# Patient Record
Sex: Male | Born: 1940 | Race: Black or African American | Hispanic: No | Marital: Married | Smoking: Never smoker
Health system: Southern US, Community
[De-identification: ages and names within clinical notes are randomized; demographics above are authoritative.]

## PROBLEM LIST (undated history)

## (undated) DIAGNOSIS — F0150 Vascular dementia without behavioral disturbance: Secondary | ICD-10-CM

## (undated) DIAGNOSIS — J45909 Unspecified asthma, uncomplicated: Secondary | ICD-10-CM

## (undated) DIAGNOSIS — R55 Syncope and collapse: Secondary | ICD-10-CM

## (undated) DIAGNOSIS — I10 Essential (primary) hypertension: Secondary | ICD-10-CM

## (undated) DIAGNOSIS — J4520 Mild intermittent asthma, uncomplicated: Secondary | ICD-10-CM

## (undated) DIAGNOSIS — N200 Calculus of kidney: Secondary | ICD-10-CM

## (undated) DIAGNOSIS — M1990 Unspecified osteoarthritis, unspecified site: Secondary | ICD-10-CM

## (undated) HISTORY — DX: Syncope and collapse: R55

## (undated) HISTORY — DX: Mild intermittent asthma, uncomplicated: J45.20

## (undated) HISTORY — PX: BACK SURGERY: SHX140

## (undated) HISTORY — PX: CHOLECYSTECTOMY: SHX55

## (undated) HISTORY — PX: KNEE SURGERY: SHX244

## (undated) HISTORY — PX: SHOULDER SURGERY: SHX246

---

## 2015-04-17 ENCOUNTER — Encounter (HOSPITAL_COMMUNITY): Payer: Self-pay | Admitting: Emergency Medicine

## 2015-04-17 ENCOUNTER — Observation Stay (HOSPITAL_COMMUNITY)
Admission: EM | Admit: 2015-04-17 | Discharge: 2015-04-18 | Disposition: A | Discharge status: Home / Self Care | Payer: No Typology Code available for payment source | Attending: Internal Medicine | Admitting: Internal Medicine

## 2015-04-17 ENCOUNTER — Emergency Department (HOSPITAL_COMMUNITY): Payer: No Typology Code available for payment source

## 2015-04-17 DIAGNOSIS — D649 Anemia, unspecified: Secondary | ICD-10-CM | POA: Diagnosis not present

## 2015-04-17 DIAGNOSIS — I10 Essential (primary) hypertension: Secondary | ICD-10-CM | POA: Diagnosis not present

## 2015-04-17 DIAGNOSIS — Z7982 Long term (current) use of aspirin: Secondary | ICD-10-CM | POA: Diagnosis not present

## 2015-04-17 DIAGNOSIS — S61211A Laceration without foreign body of left index finger without damage to nail, initial encounter: Secondary | ICD-10-CM | POA: Diagnosis not present

## 2015-04-17 DIAGNOSIS — R911 Solitary pulmonary nodule: Secondary | ICD-10-CM | POA: Insufficient documentation

## 2015-04-17 DIAGNOSIS — J45909 Unspecified asthma, uncomplicated: Secondary | ICD-10-CM | POA: Insufficient documentation

## 2015-04-17 DIAGNOSIS — R739 Hyperglycemia, unspecified: Secondary | ICD-10-CM

## 2015-04-17 DIAGNOSIS — S56129A Laceration of flexor muscle, fascia and tendon of unspecified finger at forearm level, initial encounter: Secondary | ICD-10-CM

## 2015-04-17 DIAGNOSIS — D539 Nutritional anemia, unspecified: Secondary | ICD-10-CM

## 2015-04-17 DIAGNOSIS — Z833 Family history of diabetes mellitus: Secondary | ICD-10-CM | POA: Diagnosis not present

## 2015-04-17 DIAGNOSIS — R55 Syncope and collapse: Principal | ICD-10-CM

## 2015-04-17 DIAGNOSIS — N289 Disorder of kidney and ureter, unspecified: Secondary | ICD-10-CM | POA: Diagnosis not present

## 2015-04-17 DIAGNOSIS — S61209A Unspecified open wound of unspecified finger without damage to nail, initial encounter: Secondary | ICD-10-CM

## 2015-04-17 DIAGNOSIS — Z87442 Personal history of urinary calculi: Secondary | ICD-10-CM | POA: Diagnosis not present

## 2015-04-17 HISTORY — DX: Calculus of kidney: N20.0

## 2015-04-17 HISTORY — DX: Essential (primary) hypertension: I10

## 2015-04-17 HISTORY — DX: Unspecified osteoarthritis, unspecified site: M19.90

## 2015-04-17 HISTORY — DX: Syncope and collapse: R55

## 2015-04-17 HISTORY — DX: Unspecified asthma, uncomplicated: J45.909

## 2015-04-17 HISTORY — DX: Disorder of kidney and ureter, unspecified: N28.9

## 2015-04-17 HISTORY — DX: Hyperglycemia, unspecified: R73.9

## 2015-04-17 HISTORY — DX: Anemia, unspecified: D64.9

## 2015-04-17 LAB — CBC WITH DIFFERENTIAL/PLATELET
Basophils Absolute: 0 10*3/uL (ref 0.0–0.1)
Basophils Relative: 0 % (ref 0–1)
EOS ABS: 0.3 10*3/uL (ref 0.0–0.7)
EOS PCT: 4 % (ref 0–5)
HCT: 31.5 % — ABNORMAL LOW (ref 39.0–52.0)
Hemoglobin: 9.8 g/dL — ABNORMAL LOW (ref 13.0–17.0)
LYMPHS ABS: 2.3 10*3/uL (ref 0.7–4.0)
LYMPHS PCT: 37 % (ref 12–46)
MCH: 19.6 pg — ABNORMAL LOW (ref 26.0–34.0)
MCHC: 31.1 g/dL (ref 30.0–36.0)
MCV: 62.9 fL — ABNORMAL LOW (ref 78.0–100.0)
MONO ABS: 0.5 10*3/uL (ref 0.1–1.0)
Monocytes Relative: 8 % (ref 3–12)
NEUTROS PCT: 50 % (ref 43–77)
Neutro Abs: 3.1 10*3/uL (ref 1.7–7.7)
PLATELETS: 321 10*3/uL (ref 150–400)
RBC: 5.01 MIL/uL (ref 4.22–5.81)
RDW: 19.2 % — ABNORMAL HIGH (ref 11.5–15.5)
WBC: 6.1 10*3/uL (ref 4.0–10.5)

## 2015-04-17 LAB — URINALYSIS, ROUTINE W REFLEX MICROSCOPIC
Bilirubin Urine: NEGATIVE
GLUCOSE, UA: NEGATIVE mg/dL
Ketones, ur: NEGATIVE mg/dL
Nitrite: NEGATIVE
PROTEIN: 100 mg/dL — AB
Specific Gravity, Urine: 1.017 (ref 1.005–1.030)
Urobilinogen, UA: 0.2 mg/dL (ref 0.0–1.0)
pH: 7 (ref 5.0–8.0)

## 2015-04-17 LAB — I-STAT CHEM 8, ED
BUN: 18 mg/dL (ref 6–20)
Calcium, Ion: 1.18 mmol/L (ref 1.13–1.30)
Chloride: 103 mmol/L (ref 101–111)
Creatinine, Ser: 1.3 mg/dL — ABNORMAL HIGH (ref 0.61–1.24)
GLUCOSE: 157 mg/dL — AB (ref 65–99)
HEMATOCRIT: 36 % — AB (ref 39.0–52.0)
Hemoglobin: 12.2 g/dL — ABNORMAL LOW (ref 13.0–17.0)
POTASSIUM: 3.7 mmol/L (ref 3.5–5.1)
Sodium: 141 mmol/L (ref 135–145)
TCO2: 23 mmol/L (ref 0–100)

## 2015-04-17 LAB — BASIC METABOLIC PANEL
Anion gap: 11 (ref 5–15)
BUN: 14 mg/dL (ref 6–20)
CHLORIDE: 104 mmol/L (ref 101–111)
CO2: 25 mmol/L (ref 22–32)
Calcium: 9.3 mg/dL (ref 8.9–10.3)
Creatinine, Ser: 1.34 mg/dL — ABNORMAL HIGH (ref 0.61–1.24)
GFR calc non Af Amer: 52 mL/min — ABNORMAL LOW (ref >60)
GFR, EST AFRICAN AMERICAN: 60 mL/min — AB (ref >60)
Glucose, Bld: 157 mg/dL — ABNORMAL HIGH (ref 65–99)
Potassium: 3.6 mmol/L (ref 3.5–5.1)
SODIUM: 140 mmol/L (ref 135–145)

## 2015-04-17 LAB — RAPID URINE DRUG SCREEN, HOSP PERFORMED
Amphetamines: NOT DETECTED
BENZODIAZEPINES: NOT DETECTED
Barbiturates: NOT DETECTED
Cocaine: NOT DETECTED
Opiates: NOT DETECTED
Tetrahydrocannabinol: POSITIVE — AB

## 2015-04-17 LAB — TROPONIN I
Troponin I: <0.03 ng/mL (ref <0.031)
Troponin I: <0.03 ng/mL (ref <0.031)

## 2015-04-17 LAB — URINE MICROSCOPIC-ADD ON

## 2015-04-17 LAB — ETHANOL

## 2015-04-17 LAB — D-DIMER, QUANTITATIVE (NOT AT ARMC): D-Dimer, Quant: 1.23 ug/mL-FEU — ABNORMAL HIGH (ref 0.00–0.48)

## 2015-04-17 LAB — POC OCCULT BLOOD, ED: FECAL OCCULT BLD: NEGATIVE

## 2015-04-17 MED ORDER — LORAZEPAM 2 MG/ML IJ SOLN
1.0000 mg | Freq: Once | INTRAMUSCULAR | Status: AC
  Administered 2015-04-17: 1 mg via INTRAVENOUS

## 2015-04-17 MED ORDER — SODIUM CHLORIDE 0.9 % IJ SOLN
3.0000 mL | Freq: Two times a day (BID) | INTRAMUSCULAR | Status: DC
  Administered 2015-04-17: 3 mL via INTRAVENOUS

## 2015-04-17 MED ORDER — ACETAMINOPHEN 325 MG PO TABS
650.0000 mg | ORAL_TABLET | Freq: Four times a day (QID) | ORAL | Status: DC | PRN
  Administered 2015-04-17 – 2015-04-18 (×2): 650 mg via ORAL
  Filled 2015-04-17 (×2): qty 2

## 2015-04-17 MED ORDER — SODIUM CHLORIDE 0.9 % IV SOLN
INTRAVENOUS | Status: AC
  Administered 2015-04-18: via INTRAVENOUS

## 2015-04-17 MED ORDER — IOHEXOL 300 MG/ML  SOLN
100.0000 mL | Freq: Once | INTRAMUSCULAR | Status: AC | PRN
  Administered 2015-04-17: 100 mL via INTRAVENOUS

## 2015-04-17 MED ORDER — LIDOCAINE HCL (PF) 1 % IJ SOLN
30.0000 mL | Freq: Once | INTRAMUSCULAR | Status: AC
  Administered 2015-04-17: 30 mL
  Filled 2015-04-17: qty 30

## 2015-04-17 MED ORDER — ENOXAPARIN SODIUM 30 MG/0.3ML ~~LOC~~ SOLN
30.0000 mg | Freq: Every day | SUBCUTANEOUS | Status: DC
  Administered 2015-04-17: 30 mg via SUBCUTANEOUS
  Filled 2015-04-17 (×2): qty 0.3

## 2015-04-17 MED ORDER — ACETAMINOPHEN 650 MG RE SUPP: 650.0000 mg | Freq: Four times a day (QID) | RECTAL | Status: DC | PRN

## 2015-04-17 MED ORDER — LORAZEPAM 2 MG/ML IJ SOLN
1.0000 mg | Freq: Once | INTRAMUSCULAR | Status: AC
  Administered 2015-04-17: 1 mg via INTRAVENOUS
  Filled 2015-04-17: qty 1

## 2015-04-17 NOTE — ED Notes (Signed)
Patient transported to radiology
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
## 2015-04-17 NOTE — Progress Notes (Signed)
Orthopedic Tech Progress Note Patient Details:  Jerry [name] Jul 07, 1941 [id number] Applied Velcro thumb spica splint to LUE.  Pulses, motion, sensation intact before and after application.  Capillary refill less than 2 seconds before and after application. Ortho Devices Type of Ortho Device: Thumb velcro splint Splint Material: Other (comment) Ortho Device/Splint Location: LUE Ortho Device/Splint Interventions: Application   Darrol Poke 04/17/2015, 9:20 PM
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
## 2015-04-17 NOTE — ED Provider Notes (Signed)
LACERATION REPAIR Performed by: Verl Dicker Authorized by: Verl Dicker Consent: Verbal consent obtained. Risks and benefits: risks, benefits and alternatives were discussed Consent given by: patient Patient identity confirmed: provided demographic data Prepped and Draped in normal sterile fashion Wound explored  Laceration Location: Left index finger  Laceration Length: 2 cm  No Foreign Bodies seen or palpated  Anesthesia: local infiltration  Local anesthetic: lidocaine 1 % w/o epinephrine  Anesthetic total: 5 ml  Irrigation method: syringe Amount of cleaning: standard  Skin closure: 4-0 Prolene  Number of sutures: 4  Technique: Simple interuupted  Patient tolerance: Patient tolerated the procedure well with no immediate complications.   Comer Locket, PA-C 04/17/15 2026  Ezequiel Essex, MD 04/18/15 [phone]

## 2015-04-18 ENCOUNTER — Observation Stay (HOSPITAL_COMMUNITY): Payer: No Typology Code available for payment source

## 2015-04-18 DIAGNOSIS — N289 Disorder of kidney and ureter, unspecified: Secondary | ICD-10-CM | POA: Diagnosis not present

## 2015-04-18 DIAGNOSIS — R55 Syncope and collapse: Secondary | ICD-10-CM

## 2015-04-18 LAB — COMPREHENSIVE METABOLIC PANEL
ALT: 19 U/L (ref 17–63)
AST: 24 U/L (ref 15–41)
Albumin: 3.4 g/dL — ABNORMAL LOW (ref 3.5–5.0)
Alkaline Phosphatase: 69 U/L (ref 38–126)
Anion gap: 6 (ref 5–15)
BUN: 15 mg/dL (ref 6–20)
CHLORIDE: 108 mmol/L (ref 101–111)
CO2: 26 mmol/L (ref 22–32)
Calcium: 8.9 mg/dL (ref 8.9–10.3)
Creatinine, Ser: 1.14 mg/dL (ref 0.61–1.24)
GFR calc Af Amer: >60 mL/min (ref >60)
Glucose, Bld: 103 mg/dL — ABNORMAL HIGH (ref 65–99)
Potassium: 4 mmol/L (ref 3.5–5.1)
SODIUM: 140 mmol/L (ref 135–145)
Total Bilirubin: 0.4 mg/dL (ref 0.3–1.2)
Total Protein: 6.1 g/dL — ABNORMAL LOW (ref 6.5–8.1)

## 2015-04-18 LAB — FERRITIN: Ferritin: 5 ng/mL — ABNORMAL LOW (ref 24–336)

## 2015-04-18 LAB — CBC
HCT: 30.9 % — ABNORMAL LOW (ref 39.0–52.0)
HEMOGLOBIN: 9.6 g/dL — AB (ref 13.0–17.0)
MCH: 19.5 pg — ABNORMAL LOW (ref 26.0–34.0)
MCHC: 31.1 g/dL (ref 30.0–36.0)
MCV: 62.7 fL — AB (ref 78.0–100.0)
Platelets: 272 10*3/uL (ref 150–400)
RBC: 4.93 MIL/uL (ref 4.22–5.81)
RDW: 19.3 % — AB (ref 11.5–15.5)
WBC: 7.8 10*3/uL (ref 4.0–10.5)

## 2015-04-18 LAB — IRON AND TIBC
Iron: 44 ug/dL — ABNORMAL LOW (ref 45–182)
Saturation Ratios: 11 % — ABNORMAL LOW (ref 17.9–39.5)
TIBC: 403 ug/dL (ref 250–450)
UIBC: 359 ug/dL

## 2015-04-18 LAB — TROPONIN I
Troponin I: <0.03 ng/mL (ref <0.031)
Troponin I: <0.03 ng/mL (ref <0.031)

## 2015-04-18 LAB — VITAMIN B12: Vitamin B-12: 422 pg/mL (ref 180–914)

## 2015-04-18 LAB — TSH: TSH: 1.584 u[IU]/mL (ref 0.350–4.500)

## 2015-04-18 LAB — SEDIMENTATION RATE: SED RATE: 1 mm/h (ref 0–16)

## 2015-04-18 MED ORDER — OXYCODONE HCL 5 MG PO TABS: 5.0000 mg | ORAL_TABLET | Freq: Four times a day (QID) | ORAL | Status: DC | PRN

## 2015-04-18 MED ORDER — ENOXAPARIN SODIUM 40 MG/0.4ML ~~LOC~~ SOLN
40.0000 mg | SUBCUTANEOUS | Status: DC
  Filled 2015-04-18: qty 0.4

## 2015-04-18 MED ORDER — OXYCODONE HCL 5 MG PO TABS
5.0000 mg | ORAL_TABLET | Freq: Four times a day (QID) | ORAL | Status: DC | PRN
  Administered 2015-04-18 (×3): 5 mg via ORAL
  Filled 2015-04-18 (×3): qty 1

## 2015-04-18 NOTE — Progress Notes (Signed)
  Echocardiogram 2D Echocardiogram has been performed.  Jerry [name] 04/18/2015, 3:31 PM
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
## 2015-04-18 NOTE — ED Notes (Signed)
1mg  of ativan given IV per providers order; pt continued to display nervousness and anxiety d/t upcoming bedside procedure involving needles; Cartner, B at bedside verbalized order to give other dose of 1mg  ativan; second dose of ativan given from 1st vial- therefore no ativan to waste in pyxis

## 2015-04-19 LAB — PROTEIN ELECTROPHORESIS, SERUM
A/G Ratio: 1.1 (ref 0.7–2.0)
ALBUMIN ELP: 3.3 g/dL (ref 3.2–5.6)
ALPHA-2-GLOBULIN: 0.6 g/dL (ref 0.4–1.2)
Alpha-1-Globulin: 0.2 g/dL (ref 0.1–0.4)
BETA GLOBULIN: 1 g/dL (ref 0.6–1.3)
GAMMA GLOBULIN: 1.1 g/dL (ref 0.5–1.6)
Globulin, Total: 2.9 g/dL (ref 2.0–4.5)
Total Protein ELP: 6.2 g/dL (ref 6.0–8.5)

## 2015-04-19 LAB — HEMOGLOBIN A1C
Hgb A1c MFr Bld: 6 % — ABNORMAL HIGH (ref 4.8–5.6)
MEAN PLASMA GLUCOSE: 126 mg/dL

## 2015-04-21 LAB — FOLATE RBC
Folate, Hemolysate: 425.6 ng/mL
Folate, RBC: 1219 ng/mL (ref >498)
HEMATOCRIT: 34.9 % — AB (ref 37.5–51.0)
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
## 2017-11-13 DIAGNOSIS — C679 Malignant neoplasm of bladder, unspecified: Secondary | ICD-10-CM

## 2017-11-13 HISTORY — DX: Malignant neoplasm of bladder, unspecified: C67.9

## 2020-04-15 DIAGNOSIS — E291 Testicular hypofunction: Secondary | ICD-10-CM | POA: Insufficient documentation

## 2020-04-15 HISTORY — DX: Testicular hypofunction: E29.1

## 2020-07-20 ENCOUNTER — Ambulatory Visit: Payer: Medicare Other | Attending: Internal Medicine

## 2020-07-20 DIAGNOSIS — Z23 Encounter for immunization: Secondary | ICD-10-CM

## 2020-07-20 NOTE — Progress Notes (Signed)
   Covid-19 Vaccination [facility]  Name:  Jerry [name]    MRN: [id number] DOB: Sep 12, 1941  07/20/2020  Jerry [name] was observed post Covid-19 immunization for 15 minutes without incident. He was provided with Vaccine Information Sheet and instruction to access the V-Safe system.   Jerry [name] was instructed to call 911 with any severe reactions post vaccine: [name] Difficulty breathing  . Swelling of face and throat  . A fast heartbeat  . A bad rash all over body  . Dizziness and weakness

## 2021-02-18 ENCOUNTER — Encounter (HOSPITAL_BASED_OUTPATIENT_CLINIC_OR_DEPARTMENT_OTHER): Payer: Self-pay

## 2021-02-18 DIAGNOSIS — G4700 Insomnia, unspecified: Secondary | ICD-10-CM

## 2021-03-16 ENCOUNTER — Ambulatory Visit (HOSPITAL_BASED_OUTPATIENT_CLINIC_OR_DEPARTMENT_OTHER): Payer: Medicare Other | Attending: Internal Medicine | Admitting: Internal Medicine

## 2021-03-16 ENCOUNTER — Encounter (HOSPITAL_BASED_OUTPATIENT_CLINIC_OR_DEPARTMENT_OTHER): Payer: Self-pay | Admitting: Internal Medicine

## 2021-03-16 ENCOUNTER — Other Ambulatory Visit: Payer: Self-pay

## 2021-03-16 DIAGNOSIS — G4733 Obstructive sleep apnea (adult) (pediatric): Secondary | ICD-10-CM | POA: Diagnosis not present

## 2021-03-16 DIAGNOSIS — G4730 Sleep apnea, unspecified: Secondary | ICD-10-CM | POA: Diagnosis present

## 2021-03-16 DIAGNOSIS — G4700 Insomnia, unspecified: Secondary | ICD-10-CM

## 2021-03-16 HISTORY — DX: Sleep apnea, unspecified: G47.00

## 2021-03-16 HISTORY — DX: Sleep apnea, unspecified: G47.30

## 2021-03-20 DIAGNOSIS — G4730 Sleep apnea, unspecified: Secondary | ICD-10-CM

## 2021-03-20 DIAGNOSIS — G4700 Insomnia, unspecified: Secondary | ICD-10-CM | POA: Diagnosis not present

## 2021-03-20 NOTE — Procedures (Signed)
     Patient Name: Jerry [name], Jerry [name] Date: 03/16/2021 Gender: Male D.O.B: [birth year] Age (years): 79 Referring Provider: Willey Blade Height (inches): 71 Interpreting Physician: Baird Lyons MD, ABSM Weight (lbs): 190 RPSGT: Carolin Coy BMI: 26 MRN: [id number] Neck Size: 13.00  CLINICAL INFORMATION Sleep Study Type: NPSG Indication for sleep study: Hypertension Epworth Sleepiness Score: 4  SLEEP STUDY TECHNIQUE As per the AASM Manual for the Scoring of Sleep and Associated Events v2.3 (April 2016) with a hypopnea requiring 4% desaturations.  The channels recorded and monitored were frontal, central and occipital EEG, electrooculogram (EOG), submentalis EMG (chin), nasal and oral airflow, thoracic and abdominal wall motion, anterior tibialis EMG, snore microphone, electrocardiogram, and pulse oximetry.  MEDICATIONS Medications self-administered by patient taken the night of the study : none reported  SLEEP ARCHITECTURE The study was initiated at 10:27:13 PM and ended at 4:50:54 AM.  Sleep onset time was 12.9 minutes and the sleep efficiency was 84.8%%. The total sleep time was 325.5 minutes.  Stage REM latency was 54.0 minutes.  The patient spent 18.0%% of the night in stage N1 sleep, 58.5%% in stage N2 sleep, 1.5%% in stage N3 and 22% in REM.  Alpha intrusion was absent.  Supine sleep was 9.29%.  RESPIRATORY PARAMETERS The overall apnea/hypopnea index (AHI) was 10.3 per hour. There were 14 total apneas, including 12 obstructive, 0 central and 2 mixed apneas. There were 42 hypopneas and 64 RERAs.  The AHI during Stage REM sleep was 11.7 per hour.  AHI while supine was 45.6 per hour.  The mean oxygen saturation was 92.6%. The minimum SpO2 during sleep was 87.0%.  moderate snoring was noted during this study.  CARDIAC DATA The 2 lead EKG demonstrated sinus rhythm. The mean heart rate was 60.6 beats per minute. Other EKG findings include: PVCs.  LEG  MOVEMENT DATA The total PLMS were 0 with a resulting PLMS index of 0.0. Associated arousal with leg movement index was 0.0 .  IMPRESSIONS - Mild obstructive sleep apnea occurred during this study (AHI = 10.3/h). - Mild oxygen desaturation was noted during this study (Min O2 = 87.0%). Mean O2 saturation 92.6%. - The patient snored with moderate snoring volume. - EKG findings include PVCs.and PACs. - Clinically significant periodic limb movements did not occur during sleep. No significant associated arousals.  DIAGNOSIS - Obstructive Sleep Apnea (G47.33)  RECOMMENDATIONS - Suggest CPAP titration sleep study. Other options, including autopap, management consultation, a fitted oral appliance or ENT evaluation, would be based on clinical judgment. - Be careful with alcohol, sedatives and other CNS depressants that may worsen sleep apnea and disrupt normal sleep architecture. - Sleep hygiene should be reviewed to assess factors that may improve sleep quality. - Weight management and regular exercise should be initiated or continued if appropriate.  [Electronically signed] 03/20/2021 11:51 AM  Baird Lyons MD, ABSM Diplomate, American Board of Sleep Medicine   NPI: [id number]                        [place] [place] [facility]  ELECTRONICALLY SIGNED ON:  03/20/2021, 11:44 AM [place] PH: [phone]   FX: [phone] [place]

## 2021-07-08 ENCOUNTER — Ambulatory Visit (HOSPITAL_COMMUNITY)
Admission: RE | Admit: 2021-07-08 | Discharge: 2021-07-08 | Disposition: A | Discharge status: Home / Self Care | Payer: Medicare Other | Source: Ambulatory Visit | Attending: Urology | Admitting: Urology

## 2021-07-08 ENCOUNTER — Other Ambulatory Visit (HOSPITAL_COMMUNITY): Payer: Self-pay | Admitting: Urology

## 2021-07-08 ENCOUNTER — Other Ambulatory Visit: Payer: Self-pay

## 2021-07-08 DIAGNOSIS — C678 Malignant neoplasm of overlapping sites of bladder: Secondary | ICD-10-CM | POA: Insufficient documentation

## 2021-07-11 NOTE — Progress Notes (Signed)
07/11/21- 80yoM never smoker for sleep evaluation courtesy of Dr Willey Blade with concern of OSA Medical problem list includes HTN, Asthma,,Renal Insufficiency, hx Blader Cancer/ Bag, hx Prostate Cancer,  NPSG- 03/16/21- AHI 10.3/ hr, desaturation to 87%, body weight 190 lbs Epworth score-5 Body weight today-183 lbs -----Sleep Consult-wakes up a lot due urinary bag, snores Discussed sleep study and mild OSA. He complains of being sleepy and going to bed with short sleep latency by a bout 7;30-8:30 each night. Wakes after 3-4 hours with "busy brain', various stressors. Lies in bed until regains sleep after few hours, getting up 7:30-8:00 AM. Aware of snoring. Notes post nasal drip, somatic/ arthritic discomforts, bothersome pulling and dealing with urine drainage bag. No ENT surgery.Some asthma managed with Bevespi- no rescue inhaler. Some postnasal drip- no antihistamine.  Prior to Admission medications   Medication Sig Start Date End Date Taking? Authorizing Provider  albuterol (VENTOLIN HFA) 108 (90 Base) MCG/ACT inhaler Inhale 2 puffs into the lungs every 6 (six) hours as needed for wheezing or shortness of breath. 07/12/21  Yes Helina Hullum, Tarri Fuller D, MD  amLODipine (NORVASC) 10 MG tablet Take 10 mg by mouth at bedtime.   Yes [provider]  budesonide (PULMICORT) 0.5 MG/2ML nebulizer solution Take 0.5 mg by nebulization 2 (two) times daily as needed (FOR SHORTNESS OF BREATH).   Yes [provider]  Cholecalciferol 1000 UNITS tablet Take 2,000 Units by mouth daily.    Yes [provider]  Glycopyrrolate-Formoterol (BEVESPI AEROSPHERE) 9-4.8 MCG/ACT AERO Inhale 2 puffs into the lungs daily.   Yes [provider]  ipratropium (ATROVENT) 0.02 % nebulizer solution Take 0.5 mg by nebulization 4 (four) times daily.   Yes [provider]  loperamide (IMODIUM) 2 MG capsule Take 2 mg by mouth as needed for diarrhea or loose stools.   Yes [provider]   sodium chloride (OCEAN) 0.65 % SOLN nasal spray Place 1 spray into both nostrils as needed for congestion.   Yes [provider]  temazepam (RESTORIL) 15 MG capsule Take 1 capsule (15 mg total) by mouth at bedtime as needed for sleep. 07/12/21  Yes Baird Lyons D, MD  aspirin EC 81 MG tablet Take 81 mg by mouth daily. Patient not taking: Reported on 07/12/2021    [provider]  hydrochlorothiazide (MICROZIDE) 12.5 MG capsule Take 12.5 mg by mouth daily.    [provider]  losartan (COZAAR) 100 MG tablet Take 100 mg by mouth daily. Patient not taking: Reported on 07/12/2021    [provider]  Magnesium Oxide (MAG-OX PO) Take 1 tablet by mouth every other day. Patient not taking: Reported on 07/12/2021    [provider]  oxyCODONE (OXY IR/ROXICODONE) 5 MG immediate release tablet Take 1 tablet (5 mg total) by mouth every 6 (six) hours as needed for moderate pain or severe pain. Patient not taking: Reported on 07/12/2021 04/18/15   Caren Griffins, MD  ranitidine (ZANTAC) 300 MG tablet Take 300-600 mg by mouth at bedtime. Patient not taking: Reported on 07/12/2021    [provider]  tamsulosin (FLOMAX) 0.4 MG CAPS capsule Take 0.4 mg by mouth daily after supper.  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   Kidney stone    Past Surgical History:  Procedure Laterality Date  BACK SURGERY     CHOLECYSTECTOMY     KNEE SURGERY Left    SHOULDER SURGERY     for rotator cuff tear   Family History  Problem Relation Age of Onset   Diabetes Mother    Diabetes Father    Social History   Socioeconomic History   Marital status: Married    Spouse name: Not on file   Number of children: Not on file   Years of education: Not on file   Highest education level: Not on file  Occupational History    Not on file  Tobacco Use   Smoking status: Never   Smokeless tobacco: Never  Substance and Sexual Activity   Alcohol use: Yes    Alcohol/week: 3.0 standard drinks    Types: 3 Standard drinks or equivalent per week   Drug use: Yes    Types: Marijuana   Sexual activity: Not on file  Other Topics Concern   Not on file  Social History Narrative   Not on file   Social Determinants of Health   Financial Resource Strain: Not on file  Food Insecurity: Not on file  Transportation Needs: Not on file  Physical Activity: Not on file  Stress: Not on file  Social Connections: Not on file  Intimate Partner Violence: Not on file   ROS-see HPI   + = positive Constitutional:    weight loss, night sweats, fevers, chills, fatigue, lassitude. HEENT:    headaches, difficulty swallowing, tooth/dental problems, sore throat,       sneezing, itching, ear ache, +nasal congestion, post nasal drip, snoring CV:    chest pain, orthopnea, PND, swelling in lower extremities, anasarca,                        dizziness, palpitations Resp:   shortness of breath with exertion or at rest.                productive cough,   non-productive cough, coughing up of blood.              change in color of mucus.  wheezing.   Skin:    rash or lesions. GI:  No-   heartburn, indigestion, abdominal pain, nausea, vomiting, diarrhea,                 change in bowel habits, loss of appetite GU: dysuria, change in color of urine, no urgency or frequency.   flank pain. MS:   joint pain, stiffness, decreased range of motion, back pain. Neuro-    + peripheral neuropathy Psych:  change in mood or affect.  depression or anxiety.   memory loss.   OBJ- Physical Exam General- Alert, Oriented, Affect-appropriate, Distress- none acute, +not obese Skin- rash-none, lesions- none, excoriation- none Lymphadenopathy- none Head- atraumatic            Eyes- Gross vision intact, PERRLA, conjunctivae and secretions clear             Ears- Hearing, canals-normal            Nose- Clear, no-Septal dev, mucus, polyps, erosion, perforation             Throat- Mallampati IV, mucosa clear , drainage- none, tonsils- atrophic, + teeth Neck- flexible , trachea midline, no stridor , thyroid nl, carotid no bruit Chest - symmetrical excursion , unlabored           Heart/CV- RRR , no murmur , no  gallop  , no rub, nl s1 s2                           - JVD- none , edema- none, stasis changes- none, varices- none           Lung- clear to P&A, wheeze- none, cough- none , dullness-none, rub- none           Chest wall-  Abd-  Br/ Gen/ Rectal- Not done, not indicated Extrem- cyanosis- none, clubbing, none, atrophy- none, strength- nl Neuro- grossly intact to observation

## 2021-07-12 ENCOUNTER — Other Ambulatory Visit: Payer: Self-pay

## 2021-07-12 ENCOUNTER — Ambulatory Visit (INDEPENDENT_AMBULATORY_CARE_PROVIDER_SITE_OTHER): Payer: Medicare Other | Admitting: Internal Medicine

## 2021-07-12 ENCOUNTER — Encounter: Payer: Self-pay | Admitting: Internal Medicine

## 2021-07-12 VITALS — BP 148/72 | HR 59 | Temp 97.7°F | Ht 71.5 in | Wt 183.0 lb

## 2021-07-12 DIAGNOSIS — G4733 Obstructive sleep apnea (adult) (pediatric): Secondary | ICD-10-CM | POA: Diagnosis not present

## 2021-07-12 DIAGNOSIS — J4520 Mild intermittent asthma, uncomplicated: Secondary | ICD-10-CM | POA: Insufficient documentation

## 2021-07-12 DIAGNOSIS — G4700 Insomnia, unspecified: Secondary | ICD-10-CM | POA: Diagnosis not present

## 2021-07-12 HISTORY — DX: Obstructive sleep apnea (adult) (pediatric): G47.33

## 2021-07-12 MED ORDER — ALBUTEROL SULFATE HFA 108 (90 BASE) MCG/ACT IN AERS: 2.0000 | INHALATION_SPRAY | Freq: Four times a day (QID) | RESPIRATORY_TRACT | 6 refills | Status: AC | PRN

## 2021-07-12 MED ORDER — TEMAZEPAM 15 MG PO CAPS: 15.0000 mg | ORAL_CAPSULE | Freq: Every evening | ORAL | 0 refills | Status: DC | PRN

## 2021-07-12 NOTE — Assessment & Plan Note (Signed)
Mild OSA. Not clear how important this is, but needs some control if we are going to try to help him sleep more soundly. Plan- CPAP auto

## 2021-07-12 NOTE — Assessment & Plan Note (Signed)
Discussed sleep hygiene. Not sure he isn't going to bed early to escape from stress/ depression issues.  Plan- treat OSA. Add temazepam for trial while on CPAP as discussed Consider more treatment of somatic discomfort and postnasal drip.

## 2021-07-12 NOTE — Patient Instructions (Signed)
Scripts being sent to your [facility] on [address] for temazepam for sleep and for an albuterol rescue inhaler  Order- new DME, new CPAP auto 5-15, mask of choice, humidifier, supplies, AirView/ card Try wearing this every night when you sleep to control the sleep apnea and help your breathing while you sleep.  Please call if we can help

## 2021-07-12 NOTE — Assessment & Plan Note (Signed)
Discussed use of Bevespi as maintenance inhaler.  Plan- add albuterol rescue inhaler

## 2021-07-21 ENCOUNTER — Telehealth: Payer: Self-pay | Admitting: *Deleted

## 2021-07-21 ENCOUNTER — Other Ambulatory Visit: Payer: Self-pay

## 2021-07-21 ENCOUNTER — Encounter: Payer: Self-pay | Admitting: Sports Medicine

## 2021-07-21 ENCOUNTER — Ambulatory Visit (INDEPENDENT_AMBULATORY_CARE_PROVIDER_SITE_OTHER): Payer: Medicare Other

## 2021-07-21 ENCOUNTER — Ambulatory Visit (INDEPENDENT_AMBULATORY_CARE_PROVIDER_SITE_OTHER): Payer: Medicare Other | Admitting: Sports Medicine

## 2021-07-21 DIAGNOSIS — M79672 Pain in left foot: Secondary | ICD-10-CM

## 2021-07-21 DIAGNOSIS — G620 Drug-induced polyneuropathy: Secondary | ICD-10-CM | POA: Diagnosis not present

## 2021-07-21 DIAGNOSIS — M722 Plantar fascial fibromatosis: Secondary | ICD-10-CM

## 2021-07-21 DIAGNOSIS — M4802 Spinal stenosis, cervical region: Secondary | ICD-10-CM | POA: Insufficient documentation

## 2021-07-21 DIAGNOSIS — Z8551 Personal history of malignant neoplasm of bladder: Secondary | ICD-10-CM | POA: Insufficient documentation

## 2021-07-21 DIAGNOSIS — M79674 Pain in right toe(s): Secondary | ICD-10-CM | POA: Diagnosis not present

## 2021-07-21 DIAGNOSIS — E559 Vitamin D deficiency, unspecified: Secondary | ICD-10-CM | POA: Insufficient documentation

## 2021-07-21 DIAGNOSIS — J309 Allergic rhinitis, unspecified: Secondary | ICD-10-CM | POA: Insufficient documentation

## 2021-07-21 DIAGNOSIS — R251 Tremor, unspecified: Secondary | ICD-10-CM

## 2021-07-21 DIAGNOSIS — T451X5A Adverse effect of antineoplastic and immunosuppressive drugs, initial encounter: Secondary | ICD-10-CM

## 2021-07-21 DIAGNOSIS — J45909 Unspecified asthma, uncomplicated: Secondary | ICD-10-CM | POA: Insufficient documentation

## 2021-07-21 DIAGNOSIS — M79675 Pain in left toe(s): Secondary | ICD-10-CM

## 2021-07-21 DIAGNOSIS — M79671 Pain in right foot: Secondary | ICD-10-CM

## 2021-07-21 DIAGNOSIS — E782 Mixed hyperlipidemia: Secondary | ICD-10-CM

## 2021-07-21 DIAGNOSIS — I10 Essential (primary) hypertension: Secondary | ICD-10-CM

## 2021-07-21 DIAGNOSIS — B351 Tinea unguium: Secondary | ICD-10-CM

## 2021-07-21 HISTORY — DX: Essential (primary) hypertension: I10

## 2021-07-21 HISTORY — DX: Tremor, unspecified: R25.1

## 2021-07-21 HISTORY — DX: Vitamin D deficiency, unspecified: E55.9

## 2021-07-21 HISTORY — DX: Spinal stenosis, cervical region: M48.02

## 2021-07-21 HISTORY — DX: Mixed hyperlipidemia: E78.2

## 2021-07-21 HISTORY — DX: Personal history of malignant neoplasm of bladder: Z85.51

## 2021-07-21 HISTORY — DX: Allergic rhinitis, unspecified: J30.9

## 2021-07-21 NOTE — Telephone Encounter (Signed)
Patient calling to request a medication that was supposed to be sent to pharmacy for nerve damage to feet. Returned call to patient and explained that no prescribed medication was mentioned during visit but a nerve relief OTC medication was recommended by physician that he may purchase,gave him the store information, verbalized understanding.

## 2021-07-21 NOTE — Patient Instructions (Signed)
Nervive nerve relief supplement for your nerves can be purchased OTC at [employer] List For tennis shoes recommend:  [place] Can be purchased at [employer] sports or [employer] arch fit Can be purchased at any major retailers  [employer]ionic  SAS Can be purchased at [employer] or [employer]   For work shoes recommend: [employer] Work [employer] Can be purchased at a variety of places or [employer]   For casual shoes recommend: Oofos Can be purchased at [employer] sports or [employer]  Can be purchased at [employer] or [place] recommend: [employer] Can be purchased in office/[facility] [employer] Can be purchased at [employer] sports or [employer] Can be purchased at [place] recommend: Leisure centre manager at [employer] and [employer]

## 2021-07-21 NOTE — Progress Notes (Signed)
Subjective: Jerry [name] is a 80 y.o. male patient seen today in office with complaint of mildly painful thickened and elongated toenails; unable to trim. 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  States that there is a awkward sensation to the bottoms of both feet that radiates up the back of the leg with some occasional knee and back pain sharp pains started after chemo treatments helps elevate ice and rest.  Patient has no other pedal complaints at this time.   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 8 g 6   amLODipine (NORVASC) 10 MG tablet Take 10 mg by mouth at bedtime.     amLODipine (NORVASC) 10 MG tablet Take 1 tablet by mouth daily.     budesonide (PULMICORT) 0.5 MG/2ML nebulizer solution Take 0.5 mg by nebulization 2 (two) times daily as needed (FOR SHORTNESS OF BREATH).     Cholecalciferol 1000 UNITS tablet Take 2,000 Units by mouth daily.      Colesevelam HCl 3.75 g PACK Take by mouth.     Ferrous Sulfate 27 MG TABS Take by oral route.     folic acid (FOLVITE) [id number] MCG tablet Take 1 tablet every day by oral route.      Glycopyrrolate-Formoterol (BEVESPI AEROSPHERE) 9-4.8 MCG/ACT AERO Inhale 2 puffs into the lungs daily.     hydrochlorothiazide (MICROZIDE) 12.5 MG capsule Take 12.5 mg by mouth daily.     ipratropium (ATROVENT) 0.02 % nebulizer solution Take 0.5 mg by nebulization 4 (four) times daily.     ketorolac (TORADOL) 60 MG/2ML SOLN injection ketorolac 60 mg/2 mL intramuscular solution  Inject 60 mg IM x1     loperamide (IMODIUM) 2 MG capsule Take 2 mg by mouth as needed for diarrhea or loose stools.     metoprolol succinate (TOPROL-XL) 100 MG 24 hr tablet Take by mouth.     sodium chloride (OCEAN) 0.65 % SOLN nasal spray Place 1 spray into both nostrils as needed for congestion.     temazepam (RESTORIL) 15 MG capsule Take 1 capsule (15 mg total) by mouth at bedtime as needed for sleep. 30 capsule 0   triamcinolone acetonide (KENALOG) 40 MG/ML injection Kenalog 40 mg/mL suspension for injection  Inject 60 mg IM     No current facility-administered medications on file prior to visit.    No Known Allergies  Objective: Physical Exam  General: Well developed, nourished, no acute distress, awake, alert and oriented x 3  Vascular: Dorsalis pedis artery 2/4 bilateral, Posterior tibial artery 1/4 bilateral, skin temperature warm to warm proximal to distal bilateral lower extremities, no varicosities, pedal hair present bilateral.  Neurological:  Gross sensation present via light touch bilateral.  Vibratory sensation diminished bilateral with tingling noted plantar surfaces of both feet.  Negative Tinel's sign.  Dermatological: Skin is warm, dry, and supple bilateral, Nails 1-10 are tender, long, thick, and discolored with mild subungal debris, no webspace macerations present bilateral, no open lesions present bilateral, no callus/corns/hyperkeratotic tissue present bilateral. No signs of infection bilateral.  Musculoskeletal: Asymptomatic pes planus boney deformities noted bilateral.  No significant  tenderness to palpation at the plantar fascial insertion bilateral.  Muscular strength within normal limits without painon range of motion. No pain with calf compression bilateral.  X-rays consistent with midtarsal breech and pes planus deformity with mild midfoot arthritis  Assessment and Plan:  Problem List Items Addressed This Visit   None Visit Diagnoses     Chemotherapy-induced neuropathy ([place])    -  Primary   Bilateral plantar fasciitis       Relevant Orders   DG Foot Complete Right   DG Foot Complete Left   Foot pain, bilateral       Pain due to onychomycosis of toenails of both feet           -Examined patient.  -X-rays reviewed -Discussed treatment options for what patient likely is describing is chemo induced neuropathy -Recommend patient to try over-the-counter Nervive nerve relief supplement -Advised gentle stretching daily for foot type -Advised patient to wear good supportive shoes and recommend over-the-counter insole; shoe list was provided -Discussed treatment options for painful mycotic nails. -Mechanically debrided and reduced mycotic nails with sterile nail nipper and dremel nail file without incident. -Patient to return in 3 months for follow up evaluation or sooner if symptoms worsen.  Landis Martins, DPM

## 2021-08-20 ENCOUNTER — Other Ambulatory Visit: Payer: Self-pay | Admitting: Internal Medicine

## 2021-08-22 NOTE — Telephone Encounter (Signed)
Requested refill sent

## 2021-10-07 ENCOUNTER — Telehealth: Payer: Self-pay | Admitting: Internal Medicine

## 2021-10-07 NOTE — Telephone Encounter (Signed)
Called [employer] to see if they had an update on when patient is going to get his CPAP machine. Was told that patient had an appointment on 09/29/2021. Called and spoke with patient to ask him about the appointment and he stated that it was in [facility] and that he told them that [facility] was to far for him to drive and asked for something in [place] and was told that they were going to call someone to see if they could get him in in [place] and call him back and he states he never heard from anyone. Provided him with the number of [phone] to call and see about getting rescheduled for an appt and hopefully in [place]. I have rescheduled his appt to December 30th for right now and we will go from there based off the date he gets his machine. Nothing further needed at this time.

## 2021-10-07 NOTE — Telephone Encounter (Signed)
Duplicate message Jerry [name] is working on this I will close this message

## 2021-10-12 ENCOUNTER — Ambulatory Visit: Payer: Medicare Other | Admitting: Internal Medicine

## 2021-10-20 ENCOUNTER — Telehealth: Payer: Self-pay | Admitting: *Deleted

## 2021-10-20 ENCOUNTER — Other Ambulatory Visit: Payer: Self-pay

## 2021-10-20 ENCOUNTER — Encounter: Payer: Self-pay | Admitting: Sports Medicine

## 2021-10-20 ENCOUNTER — Ambulatory Visit (INDEPENDENT_AMBULATORY_CARE_PROVIDER_SITE_OTHER): Payer: Medicare Other | Admitting: Sports Medicine

## 2021-10-20 DIAGNOSIS — M79674 Pain in right toe(s): Secondary | ICD-10-CM | POA: Diagnosis not present

## 2021-10-20 DIAGNOSIS — B351 Tinea unguium: Secondary | ICD-10-CM | POA: Diagnosis not present

## 2021-10-20 DIAGNOSIS — G620 Drug-induced polyneuropathy: Secondary | ICD-10-CM

## 2021-10-20 DIAGNOSIS — M79675 Pain in left toe(s): Secondary | ICD-10-CM | POA: Diagnosis not present

## 2021-10-20 DIAGNOSIS — M79671 Pain in right foot: Secondary | ICD-10-CM

## 2021-10-20 DIAGNOSIS — M79672 Pain in left foot: Secondary | ICD-10-CM

## 2021-10-20 DIAGNOSIS — T451X5A Adverse effect of antineoplastic and immunosuppressive drugs, initial encounter: Secondary | ICD-10-CM

## 2021-10-20 NOTE — Progress Notes (Signed)
Subjective: Jerry [name] is a 80 y.o. male patient seen today in office with complaint of mildly painful thickened and elongated toenails; unable to trim. Patient reports that he still has some sharp shooting pain but it has lessened with taking the over-the-counter Nervive supplement.  Patient is frustrated because he came this morning and was sure he had an appointment and was told he did not and was later rescheduled for this afternoon.  Patient reports that he is having a difficult time with multiple health issues and also because of his history of cancer.  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 30 capsule 5   albuterol (VENTOLIN HFA) 108 (90 Base) MCG/ACT inhaler Inhale 2 puffs into the lungs every 6 (six) hours as needed for wheezing or shortness of breath. 8 g 6   amLODipine (NORVASC) 10 MG tablet Take 10 mg by mouth at bedtime.     amLODipine (NORVASC) 10 MG tablet Take 1 tablet by mouth daily.     budesonide (PULMICORT) 0.5 MG/2ML nebulizer solution Take 0.5 mg by nebulization 2 (two) times daily as needed (FOR SHORTNESS OF BREATH).     Cholecalciferol 1000 UNITS tablet Take 2,000 Units by mouth daily.      Colesevelam HCl 3.75 g PACK Take by mouth.      Ferrous Sulfate 27 MG TABS Take by oral route.     folic acid (FOLVITE) 016 MCG tablet Take 1 tablet every day by oral route.     Glycopyrrolate-Formoterol (BEVESPI AEROSPHERE) 9-4.8 MCG/ACT AERO Inhale 2 puffs into the lungs daily.     hydrochlorothiazide (MICROZIDE) 12.5 MG capsule Take 12.5 mg by mouth daily.     ipratropium (ATROVENT) 0.02 % nebulizer solution Take 0.5 mg by nebulization 4 (four) times daily.     ketorolac (TORADOL) 60 MG/2ML SOLN injection ketorolac 60 mg/2 mL intramuscular solution  Inject 60 mg IM x1     loperamide (IMODIUM) 2 MG capsule Take 2 mg by mouth as needed for diarrhea or loose stools.     metoprolol succinate (TOPROL-XL) 100 MG 24 hr tablet Take by mouth.     sodium chloride (OCEAN) 0.65 % SOLN nasal spray Place 1 spray into both nostrils as needed for congestion.     triamcinolone acetonide (KENALOG) 40 MG/ML injection Kenalog 40 mg/mL suspension for injection  Inject 60 mg IM     No current facility-administered medications on file prior to visit.    No Known Allergies  Objective: Physical Exam  General: Well developed, nourished, no acute distress, awake, alert and oriented x 3  Vascular: Dorsalis pedis artery 2/4 bilateral, Posterior tibial artery 1/4 bilateral, skin temperature warm to warm proximal to distal bilateral lower extremities, no varicosities, pedal hair present bilateral.  Neurological: Gross  sensation present via light touch bilateral.  Vibratory sensation diminished bilateral with tingling noted plantar surfaces of both feet unchanged from prior but has decreased in frequency.  Negative Tinel's sign.  Dermatological: Skin is warm, dry, and supple bilateral, Nails 1-10 are tender, mildly elongated, thick, and discolored with mild subungal debris, no webspace macerations present bilateral, no open lesions present bilateral, no callus/corns/hyperkeratotic tissue present bilateral. No signs of infection bilateral.  Musculoskeletal:  Asymptomatic pes planus boney deformities noted bilateral.  No significant tenderness to palpation at the plantar fascial insertion bilateral.  Muscular strength within normal limits without painon range of motion. No pain with calf compression bilateral.   Assessment and Plan:  Problem List Items Addressed This Visit   None Visit Diagnoses     Pain due to onychomycosis of toenails of both feet    -  Primary   Chemotherapy-induced neuropathy ([facility])       Foot pain, bilateral           -Examined patient.  -Rediscussed with patient neuropathy likely secondary to chemo and history of back problems -Recommend patient to continue with over-the-counter Nervive nerve relief supplement -Advised patient to continue with good supportive shoes for foot type -Discussed treatment options for painful mycotic nails. -Mechanically debrided and reduced mycotic nails with sterile nail nipper and dremel nail file without incident. -Patient to return in 3 months for follow up evaluation or sooner if symptoms worsen.  Landis Martins, DPM

## 2021-10-24 NOTE — Telephone Encounter (Signed)
Error message

## 2021-11-10 ENCOUNTER — Telehealth: Payer: Self-pay | Admitting: Internal Medicine

## 2021-11-10 NOTE — Telephone Encounter (Signed)
Called and spoke with Jerry [name] at [employer] to see if the patient has received his cpap machine yet. He stated that he had not received it yet and that he was called in November to pick up a machine in [facility] but told them that was to far for him and he wanted to be rescheduled to [place]. He said patient was assigned to an RT in [place] and nothing has been done. Jerry [name] said he was going to message that person and the team to call patient and get him scheduled to get machine. Called and spoke with patient to let him know above information and that we needed to reschedule his appt for tomorrow since he doesn't have machine yet. Provided him with the number for [employer] again and told him that if he does not hear anything from them today or tomorrow to call and follow up with them. Patient expressed understanding and has been rescheduled to February. Nothing further needed at this time.

## 2021-11-11 ENCOUNTER — Ambulatory Visit: Payer: Medicare Other | Admitting: Internal Medicine

## 2022-01-01 ENCOUNTER — Encounter: Payer: Self-pay | Admitting: Internal Medicine

## 2022-01-02 NOTE — Progress Notes (Signed)
07/11/21- 80yoM never smoker for sleep evaluation courtesy of Dr Willey Blade with concern of OSA Medical problem list includes HTN, Asthma,,Renal Insufficiency, hx Blader Cancer/ Bag, hx Prostate Cancer,  NPSG- 03/16/21- AHI 10.3/ hr, desaturation to 87%, body weight 190 lbs Epworth score-5 Body weight today-183 lbs -----Sleep Consult-wakes up a lot due urinary bag, snores Discussed sleep study and mild OSA. He complains of being sleepy and going to bed with short sleep latency by a bout 7;30-8:30 each night. Wakes after 3-4 hours with "busy brain', various stressors. Lies in bed until regains sleep after few hours, getting up 7:30-8:00 AM. Aware of snoring. Notes post nasal drip, somatic/ arthritic discomforts, bothersome pulling and dealing with urine drainage bag. No ENT surgery.Some asthma managed with Bevespi- no rescue inhaler. Some postnasal drip- no antihistamine.  01/03/22- 80 yoM never smoker followed for OSA, complicated by HTN, Asthma,,Renal Insufficiency, hx Bladder Cancer/ Bag, hx Prostate Cancer, -Temazepam 15, Bevespi, Ventolin hfa, Atrovent neb solution,  CPAP auto 5-15/ [facility]   AirSense 11 AutoSet Download-compliance 27%, AHI 0.2/ hr     mostly short nights- used 97% of nights. Body weight today-177 lbs Covid vax-3 Phizer Flu vax-no -----Patient states that he is still trying to get used to CPAP machine. Is not able to sleep all night with the mask on. Mask leak is disturbing. Insurance would not cover temazepam.  He describes a pretty clear-cut psychophysiologic insomnia pattern, ruminating about "people and events".   ROS-see HPI   + = positive Constitutional:    weight loss, night sweats, fevers, chills, fatigue, lassitude. HEENT:    headaches, difficulty swallowing, tooth/dental problems, sore throat,       sneezing, itching, ear ache, +nasal congestion, post nasal drip, snoring CV:    chest pain, orthopnea, PND, swelling in lower extremities, anasarca,                          dizziness, palpitations Resp:   shortness of breath with exertion or at rest.                productive cough,   non-productive cough, coughing up of blood.              change in color of mucus.  wheezing.   Skin:    rash or lesions. GI:  No-   heartburn, indigestion, abdominal pain, nausea, vomiting, diarrhea,                 change in bowel habits, loss of appetite GU: dysuria, change in color of urine, no urgency or frequency.   flank pain. MS:   joint pain, stiffness, decreased range of motion, back pain. Neuro-    + peripheral neuropathy Psych:  change in mood or affect.  depression or anxiety.   memory loss.   OBJ- Physical Exam General- Alert, Oriented, Affect-appropriate, Distress- none acute, +not obese Skin- rash-none, lesions- none, excoriation- none Lymphadenopathy- none Head- atraumatic            Eyes- Gross vision intact, PERRLA, conjunctivae and secretions clear            Ears- Hearing, canals-normal            Nose- Clear, no-Septal dev, mucus, polyps, erosion, perforation             Throat- Mallampati IV, mucosa clear , drainage- none, tonsils- atrophic, + teeth Neck- flexible , trachea midline, no stridor , thyroid nl, carotid no bruit  Chest - symmetrical excursion , unlabored           Heart/CV- RRR , no murmur , no gallop  , no rub, nl s1 s2                           - JVD- none , edema- none, stasis changes- none, varices- none           Lung- clear to P&A, wheeze- none, cough- none , dullness-none, rub- none           Chest wall-  Abd-  Br/ Gen/ Rectal- Not done, not indicated Extrem- cyanosis- none, clubbing, none, atrophy- none, strength- nl Neuro- grossly intact to observation

## 2022-01-03 ENCOUNTER — Ambulatory Visit (INDEPENDENT_AMBULATORY_CARE_PROVIDER_SITE_OTHER): Payer: Medicare Other | Admitting: Internal Medicine

## 2022-01-03 ENCOUNTER — Encounter: Payer: Self-pay | Admitting: Internal Medicine

## 2022-01-03 ENCOUNTER — Other Ambulatory Visit: Payer: Self-pay

## 2022-01-03 VITALS — BP 110/62 | HR 69 | Temp 98.2°F | Ht 71.5 in | Wt 177.4 lb

## 2022-01-03 DIAGNOSIS — G4700 Insomnia, unspecified: Secondary | ICD-10-CM | POA: Diagnosis not present

## 2022-01-03 DIAGNOSIS — G4733 Obstructive sleep apnea (adult) (pediatric): Secondary | ICD-10-CM | POA: Diagnosis not present

## 2022-01-03 DIAGNOSIS — G4730 Sleep apnea, unspecified: Secondary | ICD-10-CM | POA: Diagnosis not present

## 2022-01-03 MED ORDER — TRAZODONE HCL 50 MG PO TABS: ORAL_TABLET | ORAL | 5 refills | Status: DC

## 2022-01-03 NOTE — Patient Instructions (Signed)
Script sent to try Trazodone for sleep    1 or 2 tabs as bedtime as needed  Order- refer to sleep center for mask fitting  Please call as needed

## 2022-01-16 NOTE — Assessment & Plan Note (Signed)
Psychophysiologic insomnia.  Appropriate discussion and questions answered. ?Plan-trazodone ?

## 2022-01-16 NOTE — Assessment & Plan Note (Signed)
Benefits from CPAP when used.  We need to get ahead of discomfort issues. ?Plan-encouragement given.  Referring for mask fitting.  Continue auto 5-15. ?

## 2022-01-18 ENCOUNTER — Ambulatory Visit (HOSPITAL_BASED_OUTPATIENT_CLINIC_OR_DEPARTMENT_OTHER): Payer: Medicare Other | Attending: Internal Medicine | Admitting: Internal Medicine

## 2022-01-18 DIAGNOSIS — G4733 Obstructive sleep apnea (adult) (pediatric): Secondary | ICD-10-CM

## 2022-01-19 ENCOUNTER — Encounter: Payer: Self-pay | Admitting: Sports Medicine

## 2022-01-19 ENCOUNTER — Other Ambulatory Visit: Payer: Self-pay

## 2022-01-19 ENCOUNTER — Ambulatory Visit (INDEPENDENT_AMBULATORY_CARE_PROVIDER_SITE_OTHER): Payer: Medicare Other | Admitting: Sports Medicine

## 2022-01-19 DIAGNOSIS — T451X5A Adverse effect of antineoplastic and immunosuppressive drugs, initial encounter: Secondary | ICD-10-CM

## 2022-01-19 DIAGNOSIS — B351 Tinea unguium: Secondary | ICD-10-CM

## 2022-01-19 DIAGNOSIS — M79675 Pain in left toe(s): Secondary | ICD-10-CM | POA: Diagnosis not present

## 2022-01-19 DIAGNOSIS — G620 Drug-induced polyneuropathy: Secondary | ICD-10-CM

## 2022-01-19 DIAGNOSIS — M25562 Pain in left knee: Secondary | ICD-10-CM

## 2022-01-19 DIAGNOSIS — M79674 Pain in right toe(s): Secondary | ICD-10-CM

## 2022-01-19 NOTE — Progress Notes (Signed)
Subjective: ?Jerry [name] is a 81 y.o. male patient seen today in office with complaint of mildly painful thickened and elongated toenails; unable to trim. Patient reports that he is still having some neuropathy pains but the worst pain is now at his left knee cannot bend or extend it completely and has noticed some swelling on the knee patient denies any history of trauma or injury.  Patient denies any other pedal complaints at this time. ? ?Patient Active Problem List  ? Diagnosis Date Noted  ? History of malignant neoplasm of bladder 07/21/2021  ? Allergic rhinitis 07/21/2021  ? Asthma 07/21/2021  ? Benign essential hypertension 07/21/2021  ? Degenerative cervical spinal stenosis 07/21/2021  ? 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 Tremor 07/21/2021  ? Vitamin D deficiency 07/21/2021  ? OSA (obstructive sleep apnea) 07/12/2021  ? Asthma, mild intermittent   ? 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 Syncope 04/17/2015  ? Anemia 04/17/2015  ? Hyperglycemia 04/17/2015  ? Renal insufficiency 04/17/2015  ? Faintness   ? ? ?Current Outpatient Medications on File Prior to Visit  ?Medication Sig Dispense Refill  ? albuterol (VENTOLIN HFA) 108 (90 Base) MCG/ACT inhaler Inhale 2 puffs into the lungs every 6 (six) hours as needed for wheezing or shortness of breath. 8 g 6  ? amLODipine (NORVASC) 10 MG tablet Take 10 mg by mouth at bedtime.    ? Cholecalciferol 1000 UNITS tablet Take 2,000 Units by mouth daily.     ? Colesevelam HCl 3.75 g PACK Take by mouth.    ? hydrochlorothiazide (MICROZIDE) 12.5 MG capsule Take 12.5 mg by mouth daily.    ? ipratropium (ATROVENT) 0.02 % nebulizer solution Take 0.5 mg by nebulization 4 (four) times daily.    ? loperamide (IMODIUM) 2 MG capsule Take 2 mg by mouth as needed for diarrhea or loose stools.    ? metoprolol succinate (TOPROL-XL) 100 MG 24 hr tablet Take by mouth.    ? sodium chloride (OCEAN) 0.65 % SOLN nasal spray Place 1 spray into both nostrils as  needed for congestion.    ? traZODone (DESYREL) 50 MG tablet 1 at bedtime for sleep as needed 30 tablet 5  ? ?No current facility-administered medications on file prior to visit.  ? ? ?No Known Allergies ? ?Objective: ?Physical Exam ? ?General: Well developed, nourished, no acute distress, awake, alert and oriented x 3 ? ?Vascular: Dorsalis pedis artery 2/4 bilateral, Posterior tibial artery 1/4 bilateral, skin temperature warm to warm proximal to distal bilateral lower extremities, no varicosities, pedal hair present bilateral. ? ?Neurological: Gross sensation present via light touch bilateral.  Vibratory sensation diminished bilateral with tingling noted plantar surfaces of both feet unchanged from prior but has decreased in frequency like before with consistent of neuropathy. ? ?Dermatological: Skin is warm, dry, and supple bilateral, Nails 1-10 are tender, mildly elongated, thick, and discolored with mild subungal debris, no webspace macerations present bilateral, no open lesions present bilateral, no callus/corns/hyperkeratotic tissue present bilateral. No signs of infection bilateral. ? ?Musculoskeletal: Asymptomatic pes planus boney deformities noted bilateral.  Patient also subjectively is having knee pain on the left and limited knee range of motion.  No pain with calf compression bilateral. ? ? ?Assessment and Plan:  ?Problem List Items Addressed This Visit   ?None ?Visit Diagnoses   ? ? Pain due to onychomycosis of toenails of both feet    -  Primary  ? Chemotherapy-induced neuropathy ([place])      ?  Left knee pain, unspecified chronicity      ? ?  ? ? ?-Examined patient.  ?-Rediscussed with patient neuropathy likely secondary to chemo and history of back problems and advised patient to continue with over-the-counter Nervive nerve relief supplement or to further discuss further work-up from his PCP ?-Discussed with patient extent of left knee pain and advised him to discuss with PCP for referral to  orthopedics ?-Meanwhile advised patient to continue with good supportive shoes for foot type and lower extremities ?-Discussed treatment options for painful mycotic nails. ?-Mechanically debrided and reduced all painful mycotic nails with sterile nail nipper and dremel nail file without incident. ?-Patient to return in 3 months for follow up nail care, patient has neuropathy secondary to chemo but is not diabetic. ? ?Landis Martins, DPM ? ?

## 2022-01-28 ENCOUNTER — Other Ambulatory Visit: Payer: Self-pay | Admitting: Internal Medicine

## 2022-01-30 NOTE — Telephone Encounter (Signed)
Dr. Annamaria Boots, please advise if you are okay sending pt's Rx in as a 90-day supply. ? ? ?No Known Allergies ? ? ?Current Outpatient Medications:  ?  albuterol (VENTOLIN HFA) 108 (90 Base) MCG/ACT inhaler, Inhale 2 puffs into the lungs every 6 (six) hours as needed for wheezing or shortness of breath., Disp: 8 g, Rfl: 6 ?  amLODipine (NORVASC) 10 MG tablet, Take 10 mg by mouth at bedtime., Disp: , Rfl:  ?  Cholecalciferol 1000 UNITS tablet, Take 2,000 Units by mouth daily. , Disp: , Rfl:  ?  Colesevelam HCl 3.75 g PACK, Take by mouth., Disp: , Rfl:  ?  hydrochlorothiazide (MICROZIDE) 12.5 MG capsule, Take 12.5 mg by mouth daily., Disp: , Rfl:  ?  ipratropium (ATROVENT) 0.02 % nebulizer solution, Take 0.5 mg by nebulization 4 (four) times daily., Disp: , Rfl:  ?  loperamide (IMODIUM) 2 MG capsule, Take 2 mg by mouth as needed for diarrhea or loose stools., Disp: , Rfl:  ?  metoprolol succinate (TOPROL-XL) 100 MG 24 hr tablet, Take by mouth., Disp: , Rfl:  ?  sodium chloride (OCEAN) 0.65 % SOLN nasal spray, Place 1 spray into both nostrils as needed for congestion., Disp: , Rfl:  ?  traZODone (DESYREL) 50 MG tablet, 1 at bedtime for sleep as needed, Disp: 30 tablet, Rfl: 5 ? ?

## 2022-01-30 NOTE — Telephone Encounter (Signed)
Trazodone refilled.

## 2022-02-19 IMAGING — CR DG CHEST 2V
2 series · 2 of 2 positions shown · non-contrast
Comparison: 04/17/2015

CLINICAL DATA: 80-year-old male with bladder neoplasm

EXAM:
CHEST - 2 VIEW

[w chest pa]
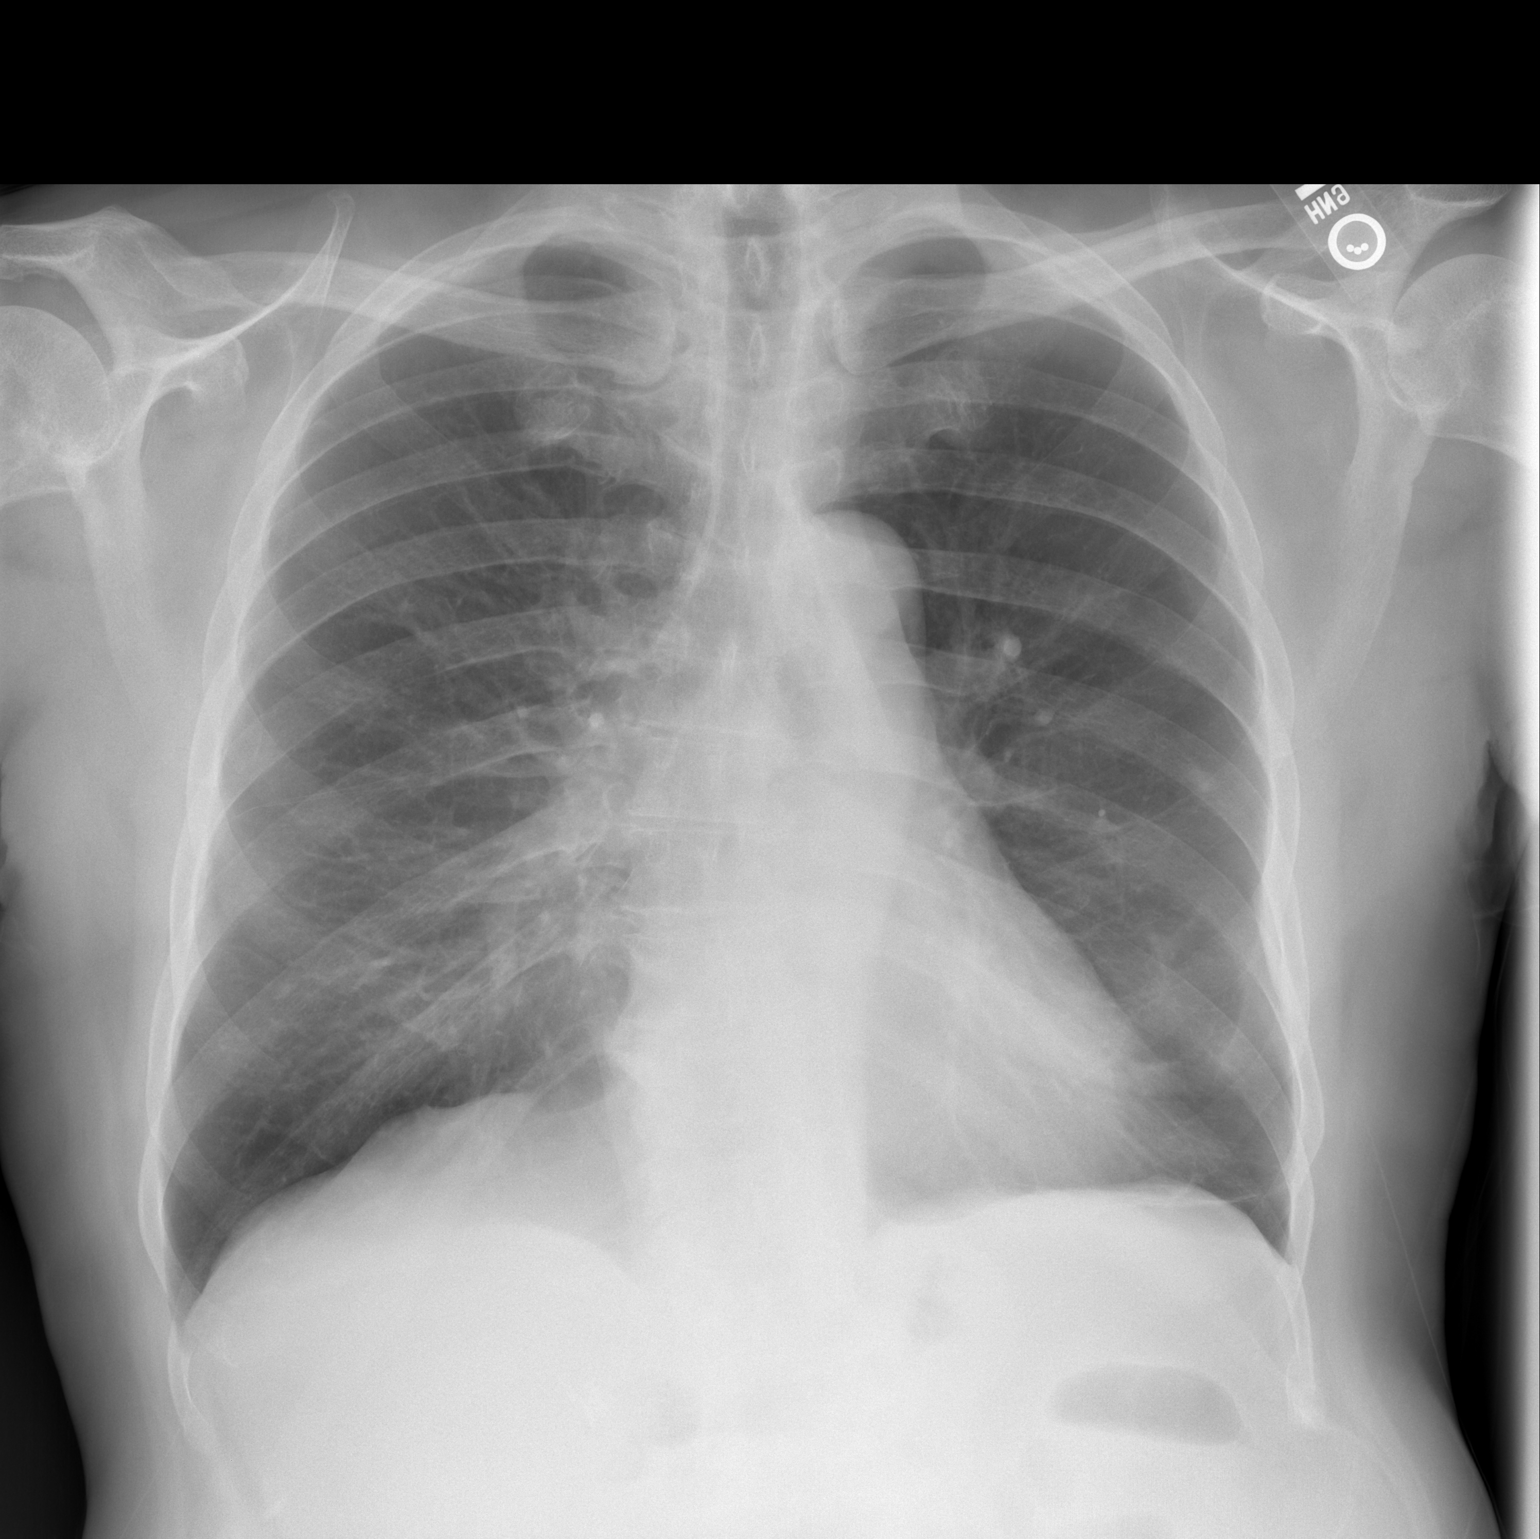

[w chest lat]
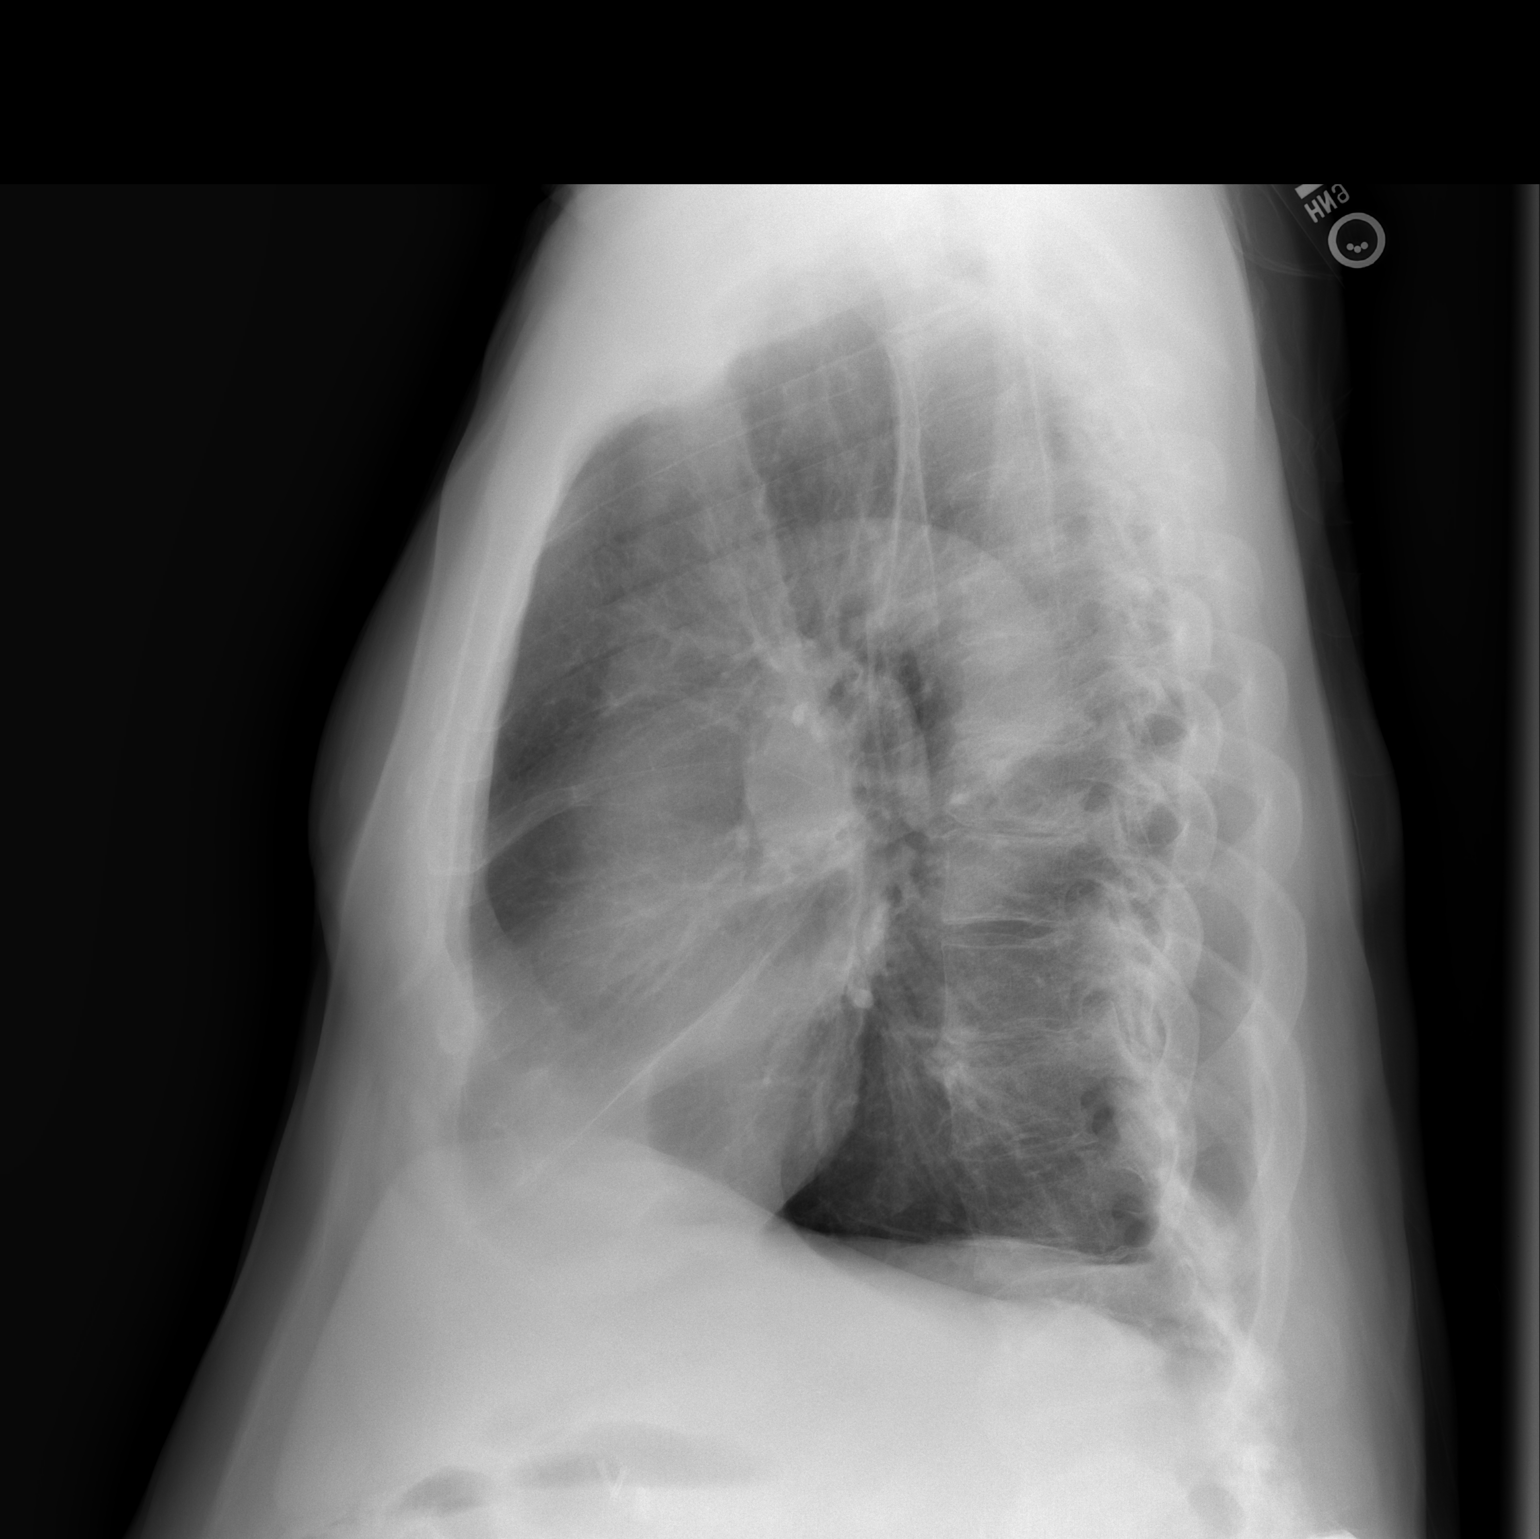

[2 of 2 positions shown; findings below may reference images not displayed]

FINDINGS: Cardiomediastinal silhouette unchanged in size and contour. No
evidence of central vascular congestion. No interlobular septal
thickening.

Stigmata of emphysema, with increased retrosternal airspace,
flattened hemidiaphragms, increased AP diameter, and hyperinflation
on the AP view.

Likely partially calcified granuloma the left chest, unchanged the
comparison chest x-ray of 3053.

No pneumothorax or pleural effusion. Coarsened interstitial
markings, with no confluent airspace disease.

No acute displaced fracture. Degenerative changes of the spine.
IMPRESSION: No active cardiopulmonary disease.

## 2022-03-28 ENCOUNTER — Ambulatory Visit: Payer: Medicare Other | Admitting: Internal Medicine

## 2022-04-04 ENCOUNTER — Ambulatory Visit: Payer: Medicare Other | Admitting: Internal Medicine

## 2022-10-07 ENCOUNTER — Other Ambulatory Visit: Payer: Self-pay | Admitting: Internal Medicine

## 2022-10-09 NOTE — Telephone Encounter (Signed)
Trazodone refilled. Please check when asking for refills. We need to see every one once a year for refills. We cannot refill controlled drugs- narcotics, adderall, ritalin etc, if patient hasn't been seen in a year without specific ok by provider. Please note on request patient's last ov and next appointment. Thanks.

## 2022-11-28 ENCOUNTER — Other Ambulatory Visit: Payer: Self-pay | Admitting: Internal Medicine

## 2022-11-28 DIAGNOSIS — F0393 Unspecified dementia, unspecified severity, with mood disturbance: Secondary | ICD-10-CM

## 2023-05-10 ENCOUNTER — Ambulatory Visit
Admission: RE | Admit: 2023-05-10 | Discharge: 2023-05-10 | Disposition: A | Discharge status: Home / Self Care | Payer: Medicare Other | Source: Ambulatory Visit | Attending: Internal Medicine | Admitting: Internal Medicine

## 2023-05-10 ENCOUNTER — Other Ambulatory Visit: Payer: Self-pay | Admitting: Internal Medicine

## 2023-05-10 DIAGNOSIS — R0600 Dyspnea, unspecified: Secondary | ICD-10-CM

## 2023-06-04 ENCOUNTER — Encounter (HOSPITAL_COMMUNITY): Payer: Self-pay

## 2023-06-04 ENCOUNTER — Emergency Department (HOSPITAL_COMMUNITY): Payer: Medicare Other

## 2023-06-04 ENCOUNTER — Other Ambulatory Visit: Payer: Self-pay

## 2023-06-04 ENCOUNTER — Emergency Department (HOSPITAL_COMMUNITY)
Admission: EM | Admit: 2023-06-04 | Discharge: 2023-06-08 | Disposition: A | Discharge status: Home / Self Care | Payer: Medicare Other | Attending: Emergency Medicine | Admitting: Emergency Medicine

## 2023-06-04 DIAGNOSIS — J45909 Unspecified asthma, uncomplicated: Secondary | ICD-10-CM | POA: Diagnosis not present

## 2023-06-04 DIAGNOSIS — R456 Violent behavior: Secondary | ICD-10-CM | POA: Diagnosis not present

## 2023-06-04 DIAGNOSIS — F02818 Dementia in other diseases classified elsewhere, unspecified severity, with other behavioral disturbance: Secondary | ICD-10-CM | POA: Insufficient documentation

## 2023-06-04 DIAGNOSIS — F32A Depression, unspecified: Secondary | ICD-10-CM | POA: Insufficient documentation

## 2023-06-04 DIAGNOSIS — R4689 Other symptoms and signs involving appearance and behavior: Secondary | ICD-10-CM

## 2023-06-04 DIAGNOSIS — I10 Essential (primary) hypertension: Secondary | ICD-10-CM | POA: Diagnosis not present

## 2023-06-04 DIAGNOSIS — F01C11 Vascular dementia, severe, with agitation: Secondary | ICD-10-CM | POA: Diagnosis not present

## 2023-06-04 DIAGNOSIS — F0150 Vascular dementia without behavioral disturbance: Secondary | ICD-10-CM | POA: Insufficient documentation

## 2023-06-04 DIAGNOSIS — R4182 Altered mental status, unspecified: Secondary | ICD-10-CM | POA: Insufficient documentation

## 2023-06-04 HISTORY — DX: Vascular dementia, unspecified severity, without behavioral disturbance, psychotic disturbance, mood disturbance, and anxiety: F01.50

## 2023-06-04 LAB — CBC WITH DIFFERENTIAL/PLATELET
Abs Immature Granulocytes: 0.05 10*3/uL (ref 0.00–0.07)
Basophils Absolute: 0 10*3/uL (ref 0.0–0.1)
Basophils Relative: 0 %
Eosinophils Absolute: 0.1 10*3/uL (ref 0.0–0.5)
Eosinophils Relative: 1 %
HCT: 37.9 % — ABNORMAL LOW (ref 39.0–52.0)
Hemoglobin: 10.8 g/dL — ABNORMAL LOW (ref 13.0–17.0)
Immature Granulocytes: 1 %
Lymphocytes Relative: 13 %
Lymphs Abs: 1.1 10*3/uL (ref 0.7–4.0)
MCH: 21 pg — ABNORMAL LOW (ref 26.0–34.0)
MCHC: 28.5 g/dL — ABNORMAL LOW (ref 30.0–36.0)
MCV: 73.7 fL — ABNORMAL LOW (ref 80.0–100.0)
Monocytes Absolute: 0.9 10*3/uL (ref 0.1–1.0)
Monocytes Relative: 10 %
Neutro Abs: 6.3 10*3/uL (ref 1.7–7.7)
Neutrophils Relative %: 75 %
Platelets: 275 10*3/uL (ref 150–400)
RBC: 5.14 MIL/uL (ref 4.22–5.81)
RDW: 25.5 % — ABNORMAL HIGH (ref 11.5–15.5)
WBC: 8.4 10*3/uL (ref 4.0–10.5)
nRBC: 0 % (ref 0.0–0.2)

## 2023-06-04 LAB — RAPID URINE DRUG SCREEN, HOSP PERFORMED
Amphetamines: NOT DETECTED
Barbiturates: NOT DETECTED
Benzodiazepines: NOT DETECTED
Cocaine: NOT DETECTED
Opiates: NOT DETECTED
Tetrahydrocannabinol: NOT DETECTED

## 2023-06-04 LAB — URINALYSIS, ROUTINE W REFLEX MICROSCOPIC
Bilirubin Urine: NEGATIVE
Glucose, UA: NEGATIVE mg/dL
Hgb urine dipstick: NEGATIVE
Ketones, ur: NEGATIVE mg/dL
Nitrite: POSITIVE — AB
Protein, ur: NEGATIVE mg/dL
Specific Gravity, Urine: 1.009 (ref 1.005–1.030)
pH: 6 (ref 5.0–8.0)

## 2023-06-04 LAB — COMPREHENSIVE METABOLIC PANEL
ALT: 25 U/L (ref 0–44)
AST: 17 U/L (ref 15–41)
Albumin: 3.9 g/dL (ref 3.5–5.0)
Alkaline Phosphatase: 65 U/L (ref 38–126)
Anion gap: 9 (ref 5–15)
BUN: 31 mg/dL — ABNORMAL HIGH (ref 8–23)
CO2: 23 mmol/L (ref 22–32)
Calcium: 9.3 mg/dL (ref 8.9–10.3)
Chloride: 111 mmol/L (ref 98–111)
Creatinine, Ser: 1.99 mg/dL — ABNORMAL HIGH (ref 0.61–1.24)
GFR, Estimated: 33 mL/min — ABNORMAL LOW (ref >60)
Glucose, Bld: 98 mg/dL (ref 70–99)
Potassium: 3.9 mmol/L (ref 3.5–5.1)
Sodium: 143 mmol/L (ref 135–145)
Total Bilirubin: 0.6 mg/dL (ref 0.3–1.2)
Total Protein: 6.9 g/dL (ref 6.5–8.1)

## 2023-06-04 LAB — TSH: TSH: 0.989 u[IU]/mL (ref 0.350–4.500)

## 2023-06-04 LAB — TROPONIN I (HIGH SENSITIVITY): Troponin I (High Sensitivity): 7 ng/L (ref <18)

## 2023-06-04 NOTE — ED Notes (Signed)
Wife requesting patient does not call her for the rest of the night. Patient has called multiple times int the last few hours.
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
## 2023-06-04 NOTE — ED Notes (Signed)
Pt has one belongings bag with pants, shirt, hat, shoes, and phone. Placed in locker # 31. Pt wallet, money, keys in lockbox with security.

## 2023-06-05 DIAGNOSIS — R4182 Altered mental status, unspecified: Secondary | ICD-10-CM | POA: Diagnosis not present

## 2023-06-05 DIAGNOSIS — F01C11 Vascular dementia, severe, with agitation: Secondary | ICD-10-CM | POA: Diagnosis not present

## 2023-06-05 MED ORDER — OLANZAPINE 2.5 MG PO TABS: 2.5000 mg | ORAL_TABLET | Freq: Two times a day (BID) | ORAL | 0 refills | Status: AC | PRN

## 2023-06-05 MED ORDER — ALBUTEROL SULFATE HFA 108 (90 BASE) MCG/ACT IN AERS
2.0000 | INHALATION_SPRAY | Freq: Four times a day (QID) | RESPIRATORY_TRACT | Status: DC | PRN
  Filled 2023-06-05: qty 6.7

## 2023-06-05 MED ORDER — OLANZAPINE 2.5 MG PO TABS
2.5000 mg | ORAL_TABLET | Freq: Every day | ORAL | Status: DC
  Administered 2023-06-05 – 2023-06-07 (×3): 2.5 mg via ORAL
  Filled 2023-06-05 (×3): qty 1

## 2023-06-05 MED ORDER — ESCITALOPRAM OXALATE 5 MG PO TABS: 5.0000 mg | ORAL_TABLET | Freq: Every day | ORAL | 0 refills | Status: DC

## 2023-06-05 MED ORDER — OLANZAPINE 2.5 MG PO TABS
2.5000 mg | ORAL_TABLET | Freq: Two times a day (BID) | ORAL | Status: DC | PRN
  Administered 2023-06-05: 2.5 mg via ORAL
  Filled 2023-06-05: qty 1

## 2023-06-05 MED ORDER — OLANZAPINE 2.5 MG PO TABS: 2.5000 mg | ORAL_TABLET | Freq: Every day | ORAL | 0 refills | Status: DC

## 2023-06-05 MED ORDER — OLANZAPINE 10 MG IM SOLR: 2.5000 mg | Freq: Once | INTRAMUSCULAR | Status: DC | PRN

## 2023-06-05 MED ORDER — ESCITALOPRAM OXALATE 10 MG PO TABS
5.0000 mg | ORAL_TABLET | Freq: Every day | ORAL | Status: DC
  Administered 2023-06-05 – 2023-06-08 (×4): 5 mg via ORAL
  Filled 2023-06-05 (×4): qty 1

## 2023-06-05 MED ORDER — DIVALPROEX SODIUM 125 MG PO CSDR
125.0000 mg | DELAYED_RELEASE_CAPSULE | Freq: Two times a day (BID) | ORAL | Status: DC
  Administered 2023-06-05 – 2023-06-08 (×7): 125 mg via ORAL
  Filled 2023-06-05 (×7): qty 1

## 2023-06-05 NOTE — BH Assessment (Signed)
Clinician made referral to [facility].  Maralyn Sago there said that she will post when and who will be doing the assessment.

## 2023-06-05 NOTE — ED Notes (Signed)
Alerted by psych team to not sen patient home at this time.

## 2023-06-05 NOTE — Consult Note (Addendum)
  This patient was cleared by [facility] last night.  This afternoon provider called his wife Corrie Dandy three times regarding transportation back home but she did not answer her phone and multiple messages left for her to call.   Patient is calm and cooperative.  Started Depakote sprinkle, Lexapro and Olanzapine this afternoon.  Patient states he regrets all he said about his wife.  Patient requested a phone to call and apologize to his wife.  He says he is too old to get married and would rather keep her.  We will continue to call and speak to his wife before we send patient home.  Lexapro, Depakote and Olanzapine are sent to his Pharmacy.  Patient denied SI/head injury/AVH and he did not mention his wife as cheating or lying to him this afternoon.    Wife Corrie Dandy called back and we reviewed new medications ordered Depakote and the use.  Patient spoke amicably with his wife.  Wife states that the aggressive behavior has been on going and is not new.  Patient will be home alone per his wife.  MS Corrie Dandy vehemently refused to come home but states she has made food and cleaned the house for patient to go home. Reviewed this discharge plan with DR Lucianne Muss, she is not in support of patient 82 years old with Dementia going home to stay alone.  TOC  is in  and [place], SW called this provider stating there has to be a meeting regarding safe discharge tomorrow.  She states there is noting TOC can do for safety at home but there will be a meeting tomorrow with staff and his wife..Patient will spend the night.

## 2023-06-05 NOTE — ED Provider Notes (Addendum)
Emergency Medicine Observation Re-evaluation Note  Jerry [name] is a 82 y.o. male, seen on rounds today.  Pt initially presented to the ED for complaints of Altered Mental Status Currently, the patient is sleeping.  Physical Exam  BP (!) 144/89 (BP Location: Right Arm)   Pulse 69   Temp 99.3 F (37.4 C) (Oral)   Resp 18   Ht 1.803 m (5\' 11" )   Wt 80 kg   SpO2 98%   BMI 24.60 kg/m  Physical Exam General: nad Cardiac: regular rate Lungs: normal effort   ED Course / MDM  EKG:EKG Interpretation Date/Time:  Monday June 04 2023 16:35:51 EDT Ventricular Rate:  66 PR Interval:  168 QRS Duration:  86 QT Interval:  354 QTC Calculation: 371 R Axis:   33  Text Interpretation: Normal sinus rhythm Normal ECG When compared with ECG of 04-Jun-2023 16:35, PREVIOUS ECG IS PRESENT Confirmed by Glyn Ade [phone]) on 06/04/2023 6:07:29 PM  I have reviewed the labs performed to date as well as medications administered while in observation.  Recent changes in the last 24 hours include evaluation by psychiatry..  Plan  Pt evaluated by psychiatry.  No indication for hospital admission.  Recommendation is for family to discuss possible outpatient memory care centers. Med reccs provided by psychiatry.  New meds ordered, lexapro and Alric Quan, MD 06/05/23 [phone]

## 2023-06-05 NOTE — Progress Notes (Signed)
CSW received consult for patient for memory care placement.   CSW spoke with patient's wife Jerry [name] to discuss discharge plan. CSW informed Jerry [name] that we do not do memory care placements from the ED. Jerry [name] states she cannot afford to privately pay for memory care placement due to the couple's fixed income. CSW explained to [facility] how to apply for Medicaid for long term care. Jerry [name] agreeable for CSW to add information to patient's AVS for her to review. Jerry [name] had questions for MD.  CSW informed MD of wife's request to discuss medical questions prior to discharge.  Edwin Dada, MSW, LCSW Transitions of Care  Clinical Social Worker II [phone]

## 2023-06-05 NOTE — ED Notes (Signed)
PTAR cancelled

## 2023-06-05 NOTE — ED Provider Notes (Signed)
I have discussed the case with the consulting psychiatrist who feels patient would be best managed at a memory care unit.  I have consulted transitions of care to see about appropriate placement.   Dione Booze, MD 06/05/23 [phone]

## 2023-06-05 NOTE — ED Notes (Signed)
Patient currently on phone with wife.

## 2023-06-05 NOTE — ED Notes (Addendum)
TTS in progress 

## 2023-06-05 NOTE — Progress Notes (Addendum)
TOC noted consult from Psych NP. Per Julieanne Cotton, she spoke with pt's wife and wife reported she is leaving the home and will not be returning. She reported she has prepared food and will make sure the pt has his medications but she will no longer be staying in the home. This CSW reviewed chart and noted that pt has Medicare A/B without [employer] Waiver nor 3 midnight inpatient stay. Pt likely not a candidate for SNF if able to mobilize independently and complete ADLs. Based on insurance, TOC would be unable to place pt into a rehab facility with a locked memory care unit unless he is admitted for 3 midnights (inpatient status, NOT observation).  Additionally, pt would need Medicaid as long term payor source or private pay for Memory Care if ineligible for Medicaid. Julieanne Cotton reported that wife states they do not have any money to private pay for any care. At this time, Psych NP feels that pt should remain under IVC.   This CSW informed Julieanne Cotton that since pt is psychiatrically cleared with disposition unknown, our next step should be a family meeting. AM CSW will outreach to psych provider, wife, and [facility] leadership on tomorrow (7/24) to inquire about availability to schedule a meeting. TOC following.   Addend @ 5:58 PM This CSW attempted to contact pt's wife; left HIPAA compliant vm requesting call back. Only a home phone listed for pt's wife. Will attempt to contact pt's daughter listed.   Addend @ 6:00 PM Attempted to contact pt's daughter. Left HIPAA compliant vm requesting a call back.

## 2023-06-05 NOTE — Discharge Instructions (Signed)
Apply for Medicaid online ([email or web address]) or in person at [facility] DSS. [facility] DSS is located at [address] [place], [place] [address]. DSS staff can be reached at [phone].  [address], [place], [place] [phone]  [facility] [address] [place], [place] [address] [phone]  [facility] [address] [place], [place] [address] [phone]
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
## 2023-06-06 DIAGNOSIS — R4182 Altered mental status, unspecified: Secondary | ICD-10-CM | POA: Diagnosis not present

## 2023-06-06 NOTE — Progress Notes (Addendum)
2:20pm: CSW spoke with patient's wife to offer a family meeting to discuss discharge planning. Jerry [name] states she is on her way to a doctor's appointment herself and wants to meet tomorrow morning. Jerry [name] states she is agreeable to come to the hospital tomorrow at 10:30am for a meeting.  CSW notified TOC supervisor and [facility] Director of information.  1pm: CSW attempted to reach patient's wife Jerry [name] via phone without success - a voicemail was left requesting a return call.  Edwin Dada, MSW, LCSW Transitions of Care  Clinical Social Worker II [phone]
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
## 2023-06-06 NOTE — ED Notes (Signed)
Jerry [name] is currently sleeping with no signs of distress or discomfort, his respirations are easy as noted by rise and fall of chest and his skin color is WNL for his ethnicity. Staff member remains at bedside.

## 2023-06-07 DIAGNOSIS — R4182 Altered mental status, unspecified: Secondary | ICD-10-CM | POA: Diagnosis not present

## 2023-06-07 MED ORDER — ALPRAZOLAM 0.5 MG PO TABS: 0.5000 mg | ORAL_TABLET | Freq: Two times a day (BID) | ORAL | Status: DC | PRN

## 2023-06-07 MED ORDER — METOPROLOL SUCCINATE ER 50 MG PO TB24
100.0000 mg | ORAL_TABLET | Freq: Two times a day (BID) | ORAL | Status: DC
  Administered 2023-06-07 – 2023-06-08 (×3): 100 mg via ORAL
  Filled 2023-06-07 (×3): qty 2

## 2023-06-07 MED ORDER — TRAZODONE HCL 50 MG PO TABS: 50.0000 mg | ORAL_TABLET | Freq: Every evening | ORAL | Status: DC | PRN

## 2023-06-07 MED ORDER — HYDROCHLOROTHIAZIDE 12.5 MG PO TABS
12.5000 mg | ORAL_TABLET | Freq: Every day | ORAL | Status: DC
  Administered 2023-06-07 – 2023-06-08 (×2): 12.5 mg via ORAL
  Filled 2023-06-07 (×2): qty 1

## 2023-06-07 MED ORDER — PRENATAL MULTIVITAMIN CH
1.0000 | ORAL_TABLET | Freq: Every day | ORAL | Status: DC
  Administered 2023-06-08: 1 via ORAL
  Filled 2023-06-07 (×2): qty 1

## 2023-06-07 MED ORDER — VITAMIN D3 25 MCG (1000 UNIT) PO TABS
2000.0000 [IU] | ORAL_TABLET | Freq: Every day | ORAL | Status: DC
  Administered 2023-06-07 – 2023-06-08 (×2): 2000 [IU] via ORAL
  Filled 2023-06-07 (×3): qty 2

## 2023-06-07 MED ORDER — MEMANTINE HCL 5 MG PO TABS
10.0000 mg | ORAL_TABLET | Freq: Two times a day (BID) | ORAL | Status: DC
  Administered 2023-06-07 – 2023-06-08 (×3): 10 mg via ORAL
  Filled 2023-06-07 (×3): qty 2

## 2023-06-07 MED ORDER — AMLODIPINE BESYLATE 5 MG PO TABS
10.0000 mg | ORAL_TABLET | Freq: Every day | ORAL | Status: DC
  Administered 2023-06-07: 10 mg via ORAL
  Filled 2023-06-07: qty 2

## 2023-06-07 NOTE — ED Notes (Signed)
Pt is sleeping with no signs of distress or discomfort respirations are easy and skin color WNL for his ethnicity. Urine drainage bag remains hanging bed side clear yellow urine noted. Pt safety sitter remains at bedside.

## 2023-06-07 NOTE — ED Provider Notes (Signed)
Emergency Medicine Observation Re-evaluation Note  Jaquel Glassburn is a 82 y.o. male, seen on rounds today.  Pt initially presented to the ED for complaints of Altered Mental Status Currently, the patient is no acute distress.  Physical Exam  BP (!) 168/91 (BP Location: Right Arm)   Pulse 70   Temp 98.8 F (37.1 C) (Oral)   Resp 20   Ht 1.803 m (5\' 11" )   Wt 80 kg   SpO2 100%   BMI 24.60 kg/m  Physical Exam   ED Course / MDM  EKG:EKG Interpretation Date/Time:  Monday June 04 2023 16:35:51 EDT Ventricular Rate:  66 PR Interval:  168 QRS Duration:  86 QT Interval:  354 QTC Calculation: 371 R Axis:   33  Text Interpretation: Normal sinus rhythm Normal ECG When compared with ECG of 04-Jun-2023 16:35, PREVIOUS ECG IS PRESENT Confirmed by Glyn Ade [phone]) on 06/04/2023 6:07:29 PM  I have reviewed the labs performed to date as well as medications administered while in observation.  Recent changes in the last 24 hours include none.  Plan  Current plan is for disposition planning with family.    Lorre Nick, MD 06/07/23 [phone]

## 2023-06-07 NOTE — Progress Notes (Signed)
TOC supervisor participated in Family meeting with CSW, psych provider, patient's wife and daughter.  Long discussion held regarding discharge planning, family discussed their history and previous experiences, long discussion regarding support systems, patient has a step daughter in [place], a daughter in [place], and son in [place].  Per family patient is not a flight risk, able to manage independently at home but has outbursts of anger centered around delusions regarding spouse's sexuality.  Daughter reports patient watches a lot of television, sees various ads/ programing that appear to be triggers for these episodes.  During meeting discussion had regarding medication adjustments made in the ED, need to modify home environment to limit access to triggering programing, discussion held around identifying family supports for spouse and long term care planning, including placement process for alf/memory care with need for payor source.  Spouse reports they cannot afford private pay, encouraged to apply for special assistance medicaid with department of social services.  Discussed home health support in the home, family is agreeable to this.  Set up per CSW note.  Goal identified to discharge patient tomorrow to home.

## 2023-06-07 NOTE — Progress Notes (Signed)
CSW participated in family meeting with psych provider, [facility] supervisor, patient's wife, and daughter.  Patient's wife agreeable to home health services.  CSW spoke with Kandee Keen of [place] [facility] who states the agency can accept patient for home health services.  Edwin Dada, MSW, LCSW Transitions of Care  Clinical Social Worker II [phone]

## 2023-06-08 DIAGNOSIS — R4182 Altered mental status, unspecified: Secondary | ICD-10-CM | POA: Diagnosis not present

## 2023-06-08 NOTE — Progress Notes (Addendum)
Spoke with pt's wife - she requested transport be set up for 12 PM. CSW to contact [facility]. RN aware and will request EDP rescinds. Wife reports pt has his keys and will let himself into the home. She states she is working on making sure his food and fruits are prepared.

## 2023-06-08 NOTE — ED Provider Notes (Signed)
Emergency Medicine Observation Re-evaluation Note  Jerry [name] is a 82 y.o. male, seen on rounds today.  Pt initially presented to the ED for complaints of Altered Mental Status Currently, the patient is ready for discharge home.  Social work has worked with the patient's wife and resources are in place for ongoing care at home.  Patient has no complaints this morning.  He reports he feels well and is ready to go home.  Physical Exam  BP 129/65 (BP Location: Right Arm)   Pulse 66   Temp 98.7 F (37.1 C)   Resp 18   Ht 5\' 11"  (1.803 m)   Wt 80 kg   SpO2 99%   BMI 24.60 kg/m  Physical Exam General: Alert no acute distress. Cardiac: Regular Lungs: Clear to auscultation Psych: Calm and situationally oriented and appropriate Musculoskeletal: No lower extremity edema.  Calves soft and nontender.  ED Course / MDM  EKG:EKG Interpretation Date/Time:  Monday June 04 2023 16:35:51 EDT Ventricular Rate:  66 PR Interval:  168 QRS Duration:  86 QT Interval:  354 QTC Calculation: 371 R Axis:   33  Text Interpretation: Normal sinus rhythm Normal ECG When compared with ECG of 04-Jun-2023 16:35, PREVIOUS ECG IS PRESENT Confirmed by Glyn Ade [phone]) on 06/04/2023 6:07:29 PM  I have reviewed the labs performed to date as well as medications administered while in observation.  Recent changes in the last 24 hours include social work and family planning for home care and discharge.  Plan  Current plan is for discharge with transport home.    Arby Barrette, MD 06/08/23 1114

## 2023-06-08 NOTE — Progress Notes (Signed)
Notified by RN that pt's wife is at bedside and wishes to transport pt home by POV. RN to call [facility] and cancel transport request. TOC signing off.

## 2023-06-26 ENCOUNTER — Emergency Department (HOSPITAL_COMMUNITY): Payer: Medicare Other

## 2023-06-26 ENCOUNTER — Other Ambulatory Visit: Payer: Self-pay

## 2023-06-26 ENCOUNTER — Inpatient Hospital Stay (HOSPITAL_COMMUNITY)
Admission: EM | Admit: 2023-06-26 | Discharge: 2023-06-28 | DRG: 193 | Disposition: A | Discharge status: Home Health | Payer: Medicare Other | Attending: Internal Medicine | Admitting: Internal Medicine

## 2023-06-26 ENCOUNTER — Encounter (HOSPITAL_COMMUNITY): Payer: Self-pay

## 2023-06-26 DIAGNOSIS — I129 Hypertensive chronic kidney disease with stage 1 through stage 4 chronic kidney disease, or unspecified chronic kidney disease: Secondary | ICD-10-CM | POA: Diagnosis present

## 2023-06-26 DIAGNOSIS — J189 Pneumonia, unspecified organism: Principal | ICD-10-CM | POA: Diagnosis present

## 2023-06-26 DIAGNOSIS — G4700 Insomnia, unspecified: Secondary | ICD-10-CM | POA: Diagnosis present

## 2023-06-26 DIAGNOSIS — E440 Moderate protein-calorie malnutrition: Secondary | ICD-10-CM | POA: Diagnosis present

## 2023-06-26 DIAGNOSIS — Z833 Family history of diabetes mellitus: Secondary | ICD-10-CM

## 2023-06-26 DIAGNOSIS — N1832 Chronic kidney disease, stage 3b: Secondary | ICD-10-CM | POA: Diagnosis present

## 2023-06-26 DIAGNOSIS — Z6824 Body mass index (BMI) 24.0-24.9, adult: Secondary | ICD-10-CM

## 2023-06-26 DIAGNOSIS — J45909 Unspecified asthma, uncomplicated: Secondary | ICD-10-CM | POA: Diagnosis present

## 2023-06-26 DIAGNOSIS — Z79899 Other long term (current) drug therapy: Secondary | ICD-10-CM | POA: Diagnosis not present

## 2023-06-26 DIAGNOSIS — E875 Hyperkalemia: Secondary | ICD-10-CM | POA: Diagnosis present

## 2023-06-26 DIAGNOSIS — Z8551 Personal history of malignant neoplasm of bladder: Secondary | ICD-10-CM

## 2023-06-26 DIAGNOSIS — Z7951 Long term (current) use of inhaled steroids: Secondary | ICD-10-CM | POA: Diagnosis not present

## 2023-06-26 DIAGNOSIS — G4730 Sleep apnea, unspecified: Secondary | ICD-10-CM | POA: Diagnosis present

## 2023-06-26 DIAGNOSIS — Z1152 Encounter for screening for COVID-19: Secondary | ICD-10-CM

## 2023-06-26 DIAGNOSIS — E559 Vitamin D deficiency, unspecified: Secondary | ICD-10-CM | POA: Diagnosis present

## 2023-06-26 DIAGNOSIS — Z8546 Personal history of malignant neoplasm of prostate: Secondary | ICD-10-CM | POA: Diagnosis not present

## 2023-06-26 DIAGNOSIS — E785 Hyperlipidemia, unspecified: Secondary | ICD-10-CM | POA: Diagnosis present

## 2023-06-26 DIAGNOSIS — R5381 Other malaise: Secondary | ICD-10-CM | POA: Diagnosis present

## 2023-06-26 DIAGNOSIS — F0150 Vascular dementia without behavioral disturbance: Secondary | ICD-10-CM | POA: Diagnosis present

## 2023-06-26 DIAGNOSIS — J154 Pneumonia due to other streptococci: Principal | ICD-10-CM | POA: Diagnosis present

## 2023-06-26 DIAGNOSIS — N179 Acute kidney failure, unspecified: Secondary | ICD-10-CM | POA: Diagnosis present

## 2023-06-26 DIAGNOSIS — J9601 Acute respiratory failure with hypoxia: Secondary | ICD-10-CM | POA: Diagnosis present

## 2023-06-26 DIAGNOSIS — R296 Repeated falls: Secondary | ICD-10-CM | POA: Diagnosis present

## 2023-06-26 HISTORY — DX: Pneumonia, unspecified organism: J18.9

## 2023-06-26 LAB — URINALYSIS, ROUTINE W REFLEX MICROSCOPIC
Bilirubin Urine: NEGATIVE
Glucose, UA: NEGATIVE mg/dL
Ketones, ur: NEGATIVE mg/dL
Nitrite: NEGATIVE
Protein, ur: NEGATIVE mg/dL
Specific Gravity, Urine: 1.009 (ref 1.005–1.030)
pH: 7 (ref 5.0–8.0)

## 2023-06-26 LAB — COMPREHENSIVE METABOLIC PANEL
ALT: 15 U/L (ref 0–44)
AST: 25 U/L (ref 15–41)
Albumin: 2.9 g/dL — ABNORMAL LOW (ref 3.5–5.0)
Alkaline Phosphatase: 58 U/L (ref 38–126)
Anion gap: 8 (ref 5–15)
BUN: 43 mg/dL — ABNORMAL HIGH (ref 8–23)
CO2: 24 mmol/L (ref 22–32)
Calcium: 9 mg/dL (ref 8.9–10.3)
Chloride: 101 mmol/L (ref 98–111)
Creatinine, Ser: 2.22 mg/dL — ABNORMAL HIGH (ref 0.61–1.24)
GFR, Estimated: 29 mL/min — ABNORMAL LOW (ref >60)
Glucose, Bld: 102 mg/dL — ABNORMAL HIGH (ref 70–99)
Potassium: 5.2 mmol/L — ABNORMAL HIGH (ref 3.5–5.1)
Sodium: 133 mmol/L — ABNORMAL LOW (ref 135–145)
Total Bilirubin: 0.5 mg/dL (ref 0.3–1.2)
Total Protein: 6.5 g/dL (ref 6.5–8.1)

## 2023-06-26 LAB — RESP PANEL BY RT-PCR (RSV, FLU A&B, COVID)  RVPGX2
Influenza A by PCR: NEGATIVE
Influenza B by PCR: NEGATIVE
Resp Syncytial Virus by PCR: NEGATIVE
SARS Coronavirus 2 by RT PCR: NEGATIVE

## 2023-06-26 LAB — I-STAT CG4 LACTIC ACID, ED: Lactic Acid, Venous: 1 mmol/L (ref 0.5–1.9)

## 2023-06-26 LAB — CBC
HCT: 31 % — ABNORMAL LOW (ref 39.0–52.0)
Hemoglobin: 9.1 g/dL — ABNORMAL LOW (ref 13.0–17.0)
MCH: 21 pg — ABNORMAL LOW (ref 26.0–34.0)
MCHC: 29.4 g/dL — ABNORMAL LOW (ref 30.0–36.0)
MCV: 71.4 fL — ABNORMAL LOW (ref 80.0–100.0)
Platelets: 393 10*3/uL (ref 150–400)
RBC: 4.34 MIL/uL (ref 4.22–5.81)
RDW: 22.1 % — ABNORMAL HIGH (ref 11.5–15.5)
WBC: 13 10*3/uL — ABNORMAL HIGH (ref 4.0–10.5)
nRBC: 0 % (ref 0.0–0.2)

## 2023-06-26 LAB — TROPONIN I (HIGH SENSITIVITY)
Troponin I (High Sensitivity): 7 ng/L (ref <18)
Troponin I (High Sensitivity): 7 ng/L (ref <18)

## 2023-06-26 LAB — BRAIN NATRIURETIC PEPTIDE: B Natriuretic Peptide: 111.2 pg/mL — ABNORMAL HIGH (ref 0.0–100.0)

## 2023-06-26 MED ORDER — ENSURE ENLIVE PO LIQD
237.0000 mL | Freq: Two times a day (BID) | ORAL | Status: DC
  Administered 2023-06-27 – 2023-06-28 (×3): 237 mL via ORAL

## 2023-06-26 MED ORDER — ALPRAZOLAM 0.5 MG PO TABS: 0.5000 mg | ORAL_TABLET | Freq: Two times a day (BID) | ORAL | Status: DC | PRN

## 2023-06-26 MED ORDER — SODIUM CHLORIDE 0.9 % IV SOLN
500.0000 mg | INTRAVENOUS | Status: DC
  Filled 2023-06-26: qty 5

## 2023-06-26 MED ORDER — HEPARIN SODIUM (PORCINE) 5000 UNIT/ML IJ SOLN
5000.0000 [IU] | Freq: Three times a day (TID) | INTRAMUSCULAR | Status: DC
  Administered 2023-06-26 – 2023-06-28 (×5): 5000 [IU] via SUBCUTANEOUS
  Filled 2023-06-26 (×4): qty 1

## 2023-06-26 MED ORDER — OLANZAPINE 2.5 MG PO TABS
2.5000 mg | ORAL_TABLET | Freq: Every day | ORAL | Status: DC
  Administered 2023-06-26 – 2023-06-27 (×2): 2.5 mg via ORAL
  Filled 2023-06-26 (×3): qty 1

## 2023-06-26 MED ORDER — OLANZAPINE 2.5 MG PO TABS: 2.5000 mg | ORAL_TABLET | Freq: Two times a day (BID) | ORAL | Status: DC | PRN

## 2023-06-26 MED ORDER — ALBUTEROL SULFATE (2.5 MG/3ML) 0.083% IN NEBU: 2.5000 mg | INHALATION_SOLUTION | RESPIRATORY_TRACT | Status: DC | PRN

## 2023-06-26 MED ORDER — SODIUM CHLORIDE 0.9 % IV SOLN
1.0000 g | Freq: Once | INTRAVENOUS | Status: AC
  Administered 2023-06-26: 1 g via INTRAVENOUS
  Filled 2023-06-26: qty 10

## 2023-06-26 MED ORDER — SODIUM CHLORIDE 0.9 % IV SOLN
500.0000 mg | Freq: Once | INTRAVENOUS | Status: AC
  Administered 2023-06-26: 500 mg via INTRAVENOUS
  Filled 2023-06-26: qty 5

## 2023-06-26 MED ORDER — SODIUM CHLORIDE 0.9 % IV SOLN
2.0000 g | INTRAVENOUS | Status: DC
  Administered 2023-06-27: 2 g via INTRAVENOUS
  Filled 2023-06-26: qty 20

## 2023-06-26 MED ORDER — SODIUM CHLORIDE 0.9 % IV SOLN: INTRAVENOUS | Status: AC

## 2023-06-26 NOTE — Plan of Care (Signed)

## 2023-06-26 NOTE — ED Triage Notes (Signed)
Patient bib [facility] from home after his family was concerned with his oxygen saturation. EMS reports that patients oxygen was 80s when they arrived on scene and they placed him on nasal cannula at 4 L and he is now at 100%. EMS reports lungs sound clear. Patient recently started taking a new medication for aggression. Patient has dementia. Patient following commands on arrival to ED.

## 2023-06-26 NOTE — H&P (Signed)
History and Physical    Jerry [name] ZOX:[id number] DOB: [birth year] DOA: 06/26/2023  PCP: [name], MD  Patient coming from: home  I have personally briefly reviewed patient's old medical records in [facility]  Chief Complaint: sob Carlyn Reichert and hypoxemia at home  HPI: Jerry [name] is a 82 y.o. 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 However today patient was noted by home health to have increasing sob and low sat to 80's at home and due to this EMS was called. Wife noted no fever/chills/ cough / compliant of chest pain , n/v/ d or dysuria.  She only endorses sob increase use of albuterol inhaler and progressive fatigue. She denies any sick contacts.    ED Course:  Vitals:  Afeb, bp 122/71, hr 65, rr 20  sat 82% on ra , 4L  100%  UA: large LE , many bacteria  Respiratory panel: negative covid /flu EKG:  Sinus rhythm no hyperacute st-twave change Wbc 13, Hgb9.1 ( 10.8) rdw 22, plt 71.4 Na 133, K 5.2 cr 2.2 ( 1.99) Bnp 111 Cxr: FINDINGS: Tiny left effusion. There is some patchy left mid to lower lung opacity. Acute process is possible. Recommend follow-up. No pneumothorax. Right lung is without consolidation, pneumothorax or effusion. Normal cardiopericardial silhouette. Overlapping cardiac leads. Curvature and degenerative changes along the spine.   IMPRESSION: Patchy left mid to lower lung opacity and small left effusion. Acute infiltrate is possible. Recommend follow-up to confirm clearance.   Review of Systems: As per HPI otherwise 10 point review of systems negative.   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 He has never used smokeless tobacco. He reports current alcohol use of about 3.0 standard drinks of alcohol per week. He reports current drug use. Drug: Marijuana.  No Known Allergies  Family History  Problem Relation Age of Onset   Diabetes Mother    Diabetes Father     Prior to Admission medications   Medication Sig Start Date End Date Taking? Authorizing Provider  albuterol (VENTOLIN HFA) 108 (90 Base) MCG/ACT inhaler Inhale 2 puffs into the lungs every 6 (six) hours as needed for wheezing or shortness of breath. 07/12/21   Waymon Budge, MD  ALPRAZolam Prudy Feeler) 0.5 MG tablet Take 0.5 mg by mouth 2 (two) times daily as needed for anxiety.    [provider]  amLODipine (NORVASC) 10 MG tablet Take 10 mg by mouth at bedtime.    [provider]  Cholecalciferol 1000 UNITS tablet Take 2,000 Units by mouth daily.     [provider]  Colesevelam HCl 3.75 g PACK Take 3.75 g by mouth daily after breakfast. 05/10/21   [provider]  Cyanocobalamin (VITAMIN B12 PO) Take 1 tablet by mouth daily.    [provider]  DULERA 200-5 MCG/ACT AERO  Inhale 2 puffs into the lungs 2 (two) times daily.    [provider]  escitalopram (LEXAPRO) 5 MG tablet Take 1 tablet (5 mg total) by mouth daily. 06/05/23   Linwood Dibbles, MD  escitalopram (LEXAPRO) 5 MG tablet Take 1 tablet (5 mg total) by mouth daily. 06/06/23 07/06/23  Earney Navy, NP  FeFum-FePoly-FA-B Cmp-C-Biot Henreitta Cea) CAPS Take 1 capsule by mouth daily.    [provider]  hydrochlorothiazide (MICROZIDE) 12.5 MG capsule Take 12.5 mg by mouth daily.    [provider]  loperamide (IMODIUM) 2 MG capsule Take 2 mg by mouth as needed for diarrhea  or loose stools.    [provider]  memantine (NAMENDA) 5 MG tablet Take 10 mg by mouth 2 (two) times daily.    [provider]  metoprolol succinate (TOPROL-XL) 100 MG 24 hr tablet Take 100 mg by mouth 2 (two) times daily. 06/24/21   [provider]  OLANZapine (ZYPREXA) 2.5 MG tablet Take 1 tablet (2.5 mg total) by mouth 2 (two) times daily as needed (agitation). 06/05/23   Linwood Dibbles, MD  OLANZapine (ZYPREXA) 2.5 MG tablet Take 1 tablet (2.5 mg total) by mouth at bedtime. 06/05/23 07/05/23  Earney Navy, NP  sodium chloride (OCEAN) 0.65 % SOLN nasal spray Place 1 spray into both nostrils as needed for congestion.    [provider]  traZODone (DESYREL) 50 MG tablet TAKE 1 TABLET AT BEDTIME AS NEEDED FOR SLEEP Patient taking differently: Take 50-100 mg by mouth at bedtime as needed for sleep. 10/09/22   Jetty Duhamel D, MD    Physical Exam: Vitals:   06/26/23 1455 06/26/23 1501 06/26/23 1502 06/26/23 1530  BP: 122/71   (!) 111/94  Pulse: 65   63  Resp: 20   (!) 22  Temp: 98.1 F (36.7 C)     SpO2: (!) 82%  100% 98%  Weight:  80 kg    Height:  5\' 11"  (1.803 m)      Constitutional: NAD, calm, comfortable Vitals:   06/26/23 1455 06/26/23 1501 06/26/23 1502 06/26/23 1530  BP: 122/71   (!) 111/94  Pulse: 65   63  Resp: 20   (!) 22  Temp: 98.1 F (36.7 C)     SpO2: (!) 82%  100% 98%  Weight:  80 kg    Height:  5\' 11"  (1.803 m)     Eyes: PERRL, lids and conjunctivae normal ENMT: Mucous membranes are dry Posterior pharynx clear of any exudate or lesions.Normal dentition.  Neck: normal, supple, no masses, no thyromegaly Respiratory: clear to auscultation bilaterally, no wheezing, no crackles. Normal respiratory effort. No accessory muscle use.  Diminished left base Cardiovascular: Regular rate and rhythm, no murmurs / rubs / gallops. No extremity edema. 2+ pedal pulses.  Abdomen: no tenderness, no masses palpated. No hepatosplenomegaly. Bowel  sounds positive.  Musculoskeletal: no clubbing / cyanosis. No joint deformity upper and lower extremities. Good ROM, no contractures. Normal muscle tone.  Skin: no rashes, lesions, ulcers. No induration Neurologic: CN 2-12 grossly intact. Sensation intact, MAEX4.  Psychiatric: Normal judgment and insight. Alert and oriented to person , place. Normal mood.    Labs on Admission: I have personally reviewed following labs and imaging studies  CBC: Recent Labs  Lab 06/26/23 1546  WBC 13.0*  HGB 9.1*  HCT 31.0*  MCV 71.4*  PLT 393   Basic Metabolic Panel: Recent Labs  Lab 06/26/23 1546  NA 133*  K 5.2*  CL 101  CO2 24  GLUCOSE 102*  BUN 43*  CREATININE 2.22*  CALCIUM 9.0   GFR: Estimated Creatinine Clearance: 28.3 mL/min (A) (by C-G formula based on SCr of 2.22 mg/dL (H)). Liver Function Tests: Recent Labs  Lab 06/26/23 1546  AST 25  ALT 15  ALKPHOS 58  BILITOT 0.5  PROT 6.5  ALBUMIN 2.9*   No results for input(s): "LIPASE", "AMYLASE" in the last 168 hours. No results for input(s): "AMMONIA" in the last 168 hours. Coagulation Profile: No results for input(s): "INR", "PROTIME" in the last 168 hours. Cardiac Enzymes: No results for input(s): "CKTOTAL", "CKMB", "CKMBINDEX", "TROPONINI" in the last 168 hours. BNP (last 3 results) No results for input(s): "PROBNP" in the last 8760 hours. HbA1C: No results for input(s): "HGBA1C" in the last 72 hours. CBG: No results for input(s): "GLUCAP" in the last 168 hours. Lipid Profile: No results for input(s): "CHOL", "HDL", "LDLCALC", "TRIG", "CHOLHDL", "LDLDIRECT" in the last 72 hours. Thyroid Function Tests: No results for input(s): "TSH", "T4TOTAL", "FREET4", "T3FREE", "THYROIDAB" in the last 72 hours. Anemia Panel: No results for input(s): "VITAMINB12", "FOLATE", "FERRITIN", "TIBC", "IRON", "RETICCTPCT" in the last 72 hours. Urine analysis:    Component Value Date/Time   COLORURINE YELLOW 06/26/2023 1520    APPEARANCEUR CLOUDY (A) 06/26/2023 1520   LABSPEC 1.009 06/26/2023 1520   PHURINE 7.0 06/26/2023 1520   GLUCOSEU NEGATIVE 06/26/2023 1520   HGBUR SMALL (A) 06/26/2023 1520   BILIRUBINUR NEGATIVE 06/26/2023 1520   KETONESUR NEGATIVE 06/26/2023 1520   PROTEINUR NEGATIVE 06/26/2023 1520   UROBILINOGEN 0.2 04/17/2015 1820   NITRITE NEGATIVE 06/26/2023 1520   LEUKOCYTESUR LARGE (A) 06/26/2023 1520    Radiological Exams on Admission: DG Chest Port 1 View  Result Date: 06/26/2023 CLINICAL DATA:  Hypoxia EXAM: PORTABLE CHEST 1 VIEW COMPARISON:  X-ray 06/04/2023 FINDINGS: Tiny left effusion. There is some patchy left mid to lower lung opacity. Acute process is possible. Recommend follow-up. No pneumothorax. Right lung is without consolidation, pneumothorax or effusion. Normal cardiopericardial silhouette. Overlapping cardiac leads. Curvature and degenerative changes along the spine. IMPRESSION: Patchy left mid to lower lung opacity and small left effusion. Acute infiltrate is possible. Recommend follow-up to confirm clearance. Electronically Signed   By: Karen Kays M.D.   On: 06/26/2023 16:41    EKG: Independently reviewed.  Assessment/Plan CAP presumed bacterial with acute hypoxic respiratory failure -patient with increase wbc and  Opacities on cxr  -ctx/ azithromycin, de-escalate as able  -pulmonary toilet  -urine ag, sputum, f/u on culture data   -wean O2 as able  Hyperkalemia  -repeat labs now  -hyperkalemia protocol as needed   CKDIII -at baseline   Hyperlipidemia -resume statin    Prostate cancer - s/p radical cystoprostatectomy - ileal conduit  Asthma -no wheezing , no acute exacerbation   Vascular dementia -resume home regimen once med rec completed    Hypertension -stable  -resume home regimen in am or earlier  as bp tolerates  -patient has not has bp medications today per wife    DVT prophylaxis: heparin Code Status: full/ as discussed per patient wishes in  event of cardiac arrest  Family Communication:   Larance, Viger (Spouse) [phone] ([place])   Disposition Plan: patient  expected to be admitted greater than 2 midnights  Consults called:n/a Admission status: med tele   Lurline Del MD [facility]   If 7PM-7AM, please contact night-coverage [email or web address] Password [facility]  06/26/2023, 5:31 PM

## 2023-06-26 NOTE — ED Provider Notes (Signed)
[place] EMERGENCY DEPARTMENT AT [facility] Provider Note   CSN: [id number] Arrival date & time: 06/26/23  1445     History {Add pertinent medical, surgical, social history, OB history to HPI:1} Chief Complaint  Patient presents with   hypoxia     Jerry [name] is a 82 y.o. male.  Level 5 caveat secondary to dementia.  He has a history of bladder cancer, colostomy, asthma.  History from his daughter and his wife.  She said he has not been walking much recently, usually uses a walker to ambulate.  Has been more short of breath, dyspnea on exertion.  Nonproductive cough.  No fevers.  Appetite has been good.  Blood pressure was low today and breathing seemed very shallow so brought him in for further evaluation.  Found to be hypoxic with sats in the 80s and placed on nasal cannula.  He denies any chest pain or abdominal pain.  He said his left knee hurts him sometimes.  The history is provided by the patient, the spouse and a relative.  Shortness of Breath Severity:  Moderate Onset quality:  Gradual Duration:  1 week Timing:  Intermittent Progression:  Worsening Chronicity:  New Relieved by:  Nothing Worsened by:  Activity Ineffective treatments:  Rest Associated symptoms: cough   Associated symptoms: no abdominal pain, no chest pain, no fever, no hemoptysis, no sputum production and no vomiting   Risk factors: no tobacco use        Home Medications Prior to Admission medications   Medication Sig Start Date End Date Taking? Authorizing Provider  albuterol (VENTOLIN HFA) 108 (90 Base) MCG/ACT inhaler Inhale 2 puffs into the lungs every 6 (six) hours as needed for wheezing or shortness of breath. 07/12/21   Waymon Budge, MD  ALPRAZolam Prudy Feeler) 0.5 MG tablet Take 0.5 mg by mouth 2 (two) times daily as needed for anxiety.    [provider]  amLODipine (NORVASC) 10 MG tablet Take 10 mg by mouth at bedtime.    [provider]  Cholecalciferol 1000  UNITS tablet Take 2,000 Units by mouth daily.     [provider]  Colesevelam HCl 3.75 g PACK Take 3.75 g by mouth daily after breakfast. 05/10/21   [provider]  Cyanocobalamin (VITAMIN B12 PO) Take 1 tablet by mouth daily.    [provider]  DULERA 200-5 MCG/ACT AERO Inhale 2 puffs into the lungs 2 (two) times daily.    [provider]  escitalopram (LEXAPRO) 5 MG tablet Take 1 tablet (5 mg total) by mouth daily. 06/05/23   Linwood Dibbles, MD  escitalopram (LEXAPRO) 5 MG tablet Take 1 tablet (5 mg total) by mouth daily. 06/06/23 07/06/23  Earney Navy, NP  FeFum-FePoly-FA-B Cmp-C-Biot Henreitta Cea) CAPS Take 1 capsule by mouth daily.    [provider]  hydrochlorothiazide (MICROZIDE) 12.5 MG capsule Take 12.5 mg by mouth daily.    [provider]  loperamide (IMODIUM) 2 MG capsule Take 2 mg by mouth as needed for diarrhea or loose stools.    [provider]  memantine (NAMENDA) 5 MG tablet Take 10 mg by mouth 2 (two) times daily.    [provider]  metoprolol succinate (TOPROL-XL) 100 MG 24 hr tablet Take 100 mg by mouth 2 (two) times daily. 06/24/21   [provider]  OLANZapine (ZYPREXA) 2.5 MG tablet Take 1 tablet (2.5 mg total) by mouth 2 (two) times daily as needed (agitation). 06/05/23   Linwood Dibbles,  MD  OLANZapine (ZYPREXA) 2.5 MG tablet Take 1 tablet (2.5 mg total) by mouth at bedtime. 06/05/23 07/05/23  Earney Navy, NP  sodium chloride (OCEAN) 0.65 % SOLN nasal spray Place 1 spray into both nostrils as needed for congestion.    [provider]  traZODone (DESYREL) 50 MG tablet TAKE 1 TABLET AT BEDTIME AS NEEDED FOR SLEEP Patient taking differently: Take 50-100 mg by mouth at bedtime as needed for sleep. 10/09/22   Waymon Budge, MD      Allergies    Patient has no known allergies.    Review of Systems   Review of Systems  Unable to perform ROS: Dementia  Constitutional:  Negative  for fever.  Respiratory:  Positive for cough and shortness of breath. Negative for hemoptysis and sputum production.   Cardiovascular:  Negative for chest pain.  Gastrointestinal:  Negative for abdominal pain and vomiting.    Physical Exam Updated Vital Signs BP 122/71   Pulse 65   Temp 98.1 F (36.7 C)   Resp 20   Ht 5\' 11"  (1.803 m)   Wt 80 kg   SpO2 100%   BMI 24.60 kg/m  Physical Exam Vitals and nursing note reviewed.  Constitutional:      General: He is not in acute distress.    Appearance: Normal appearance. He is well-developed.  HENT:     Head: Normocephalic and atraumatic.  Eyes:     Conjunctiva/sclera: Conjunctivae normal.  Cardiovascular:     Rate and Rhythm: Normal rate and regular rhythm.     Heart sounds: No murmur heard. Pulmonary:     Effort: Pulmonary effort is normal. No respiratory distress.     Breath sounds: Normal breath sounds.  Abdominal:     Palpations: Abdomen is soft.     Tenderness: There is no abdominal tenderness. There is no guarding or rebound.  Musculoskeletal:        General: No deformity. Normal range of motion.     Cervical back: Neck supple.     Right lower leg: No edema.     Left lower leg: No edema.  Skin:    General: Skin is warm and dry.     Capillary Refill: Capillary refill takes less than 2 seconds.  Neurological:     General: No focal deficit present.     Mental Status: He is alert.     Sensory: No sensory deficit.     Motor: No weakness.     ED Results / Procedures / Treatments   Labs (all labs ordered are listed, but only abnormal results are displayed) Labs Reviewed  RESP PANEL BY RT-PCR (RSV, FLU A&B, COVID)  [id number]  CBC  COMPREHENSIVE METABOLIC PANEL  URINALYSIS, ROUTINE W REFLEX MICROSCOPIC  BRAIN NATRIURETIC PEPTIDE  TROPONIN I (HIGH SENSITIVITY)    EKG EKG Interpretation Date/Time:  Tuesday June 26 2023 15:03:32 EDT Ventricular Rate:  65 PR Interval:  192 QRS Duration:  80 QT  Interval:  395 QTC Calculation: 411 R Axis:   61  Text Interpretation: Sinus rhythm Anteroseptal infarct, age indeterminate No significant change since prior 7/24 Confirmed by Meridee Score [phone]) on 06/26/2023 3:32:03 PM  Radiology No results found.  Procedures Procedures  {Document cardiac monitor, telemetry assessment procedure when appropriate:1}  Medications Ordered in ED Medications - No data to display  ED Course/ Medical Decision Making/ A&P   {   Click here for ABCD2, HEART and other calculatorsREFRESH Note before signing :1}  Medical Decision Making Amount and/or Complexity of Data Reviewed Labs: ordered. Radiology: ordered.   This patient complains of ***; this involves an extensive number of treatment Options and is a complaint that carries with it a high risk of complications and morbidity. The differential includes ***  I ordered, reviewed and interpreted labs, which included *** I ordered medication *** and reviewed PMP when indicated. I ordered imaging studies which included *** and I independently    visualized and interpreted imaging which showed *** Additional history obtained from *** Previous records obtained and reviewed *** I consulted *** and discussed lab and imaging findings and discussed disposition.  Cardiac monitoring reviewed, *** Social determinants considered, *** Critical Interventions: ***  After the interventions stated above, I reevaluated the patient and found *** Admission and further testing considered, ***   {Document critical care time when appropriate:1} {Document review of labs and clinical decision tools ie heart score, Chads2Vasc2 etc:1}  {Document your independent review of radiology images, and any outside records:1} {Document your discussion with family members, caretakers, and with consultants:1} {Document social determinants of health affecting pt's care:1} {Document your decision making  why or why not admission, treatments were needed:1} Final Clinical Impression(s) / ED Diagnoses Final diagnoses:  None    Rx / DC Orders ED Discharge Orders     None

## 2023-06-26 NOTE — ED Notes (Signed)
ED TO INPATIENT HANDOFF REPORT  ED Nurse Name and Phone #: Grover Canavan [phone]  S Name/Age/Gender Jerry [name] 82 y.o. male Room/Bed: 025C/025C  Code Status   Code Status: Prior  Home/SNF/Other Home Patient oriented to: self, place, and situation Is this baseline? Yes   Triage Complete: Triage complete  Chief Complaint CAP (community acquired pneumonia) [J18.9]  Triage Note Patient bib [facility] from home after his family was concerned with his oxygen saturation. EMS reports that patients oxygen was 80s when they arrived on scene and they placed him on nasal cannula at 4 L and he is now at 100%. EMS reports lungs sound clear. Patient recently started taking a new medication for aggression. Patient has dementia. Patient following commands on arrival to ED.    Allergies No Known Allergies  Level of Care/Admitting Diagnosis ED Disposition     ED Disposition  Admit   Condition  --   Comment  Hospital Area: [facility] [[id number]]  Level of Care: Telemetry Medical [104]  May admit patient to [facility] or [facility] if equivalent level of care is available:: No  Covid Evaluation: Confirmed COVID Negative  Diagnosis: CAP (community acquired pneumonia) [119147]  Admitting Physician: Lurline Del [[id number]]  Attending Physician: Lurline Del [[id number]]  Certification:: I certify this patient will need inpatient services for at least 2 midnights  Expected Medical Readiness: 07/02/2023          B Medical/Surgery History Past Medical History:  Diagnosis Date   Arthritis    Asthma    Bladder cancer (HCC) 2019   Hypertension    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   Kidney stone    Vascular dementia [facility])    Past Surgical History:  Procedure Laterality Date   BACK SURGERY     CHOLECYSTECTOMY     KNEE SURGERY Left    SHOULDER SURGERY     for rotator cuff tear     A IV Location/Drains/Wounds Patient Lines/Drains/Airways Status     Active  Line/Drains/Airways     Name Placement date Placement time Site Days   Urostomy Other (Comment) RLQ --  --  RLQ  --   Wound / Incision (Open or Dehisced) 04/17/15 Finger (Comment which one) Left laceration 04/17/15  2117  Finger (Comment which one)  2992            Intake/Output Last 24 hours No intake or output data in the 24 hours ending 06/26/23 1752  Labs/Imaging Results for orders placed or performed during the hospital encounter of 06/26/23 (from the past 48 hour(s))  Resp panel by RT-PCR (RSV, Flu A&B, Covid) Anterior Nasal Swab     Status: None   Collection Time: 06/26/23  3:20 PM   Specimen: Anterior Nasal Swab  Result Value Ref Range   SARS Coronavirus 2 by RT PCR NEGATIVE NEGATIVE   Influenza A by PCR NEGATIVE NEGATIVE   Influenza B by PCR NEGATIVE NEGATIVE    Comment: (NOTE) The Xpert Xpress SARS-CoV-2/FLU/RSV plus assay is intended as an aid in the diagnosis of influenza from Nasopharyngeal swab specimens and should not be used as a sole basis for treatment. Nasal washings and aspirates are unacceptable for Xpert Xpress SARS-CoV-2/FLU/RSV testing.  Fact Sheet for Patients: BloggerCourse.com  Fact Sheet for Healthcare Providers: SeriousBroker.it  This test is not yet approved or cleared by the [place] FDA and has been authorized for detection and/or diagnosis of SARS-CoV-2 by FDA under an Emergency Use Authorization (EUA). This EUA  will remain in effect (meaning this test can be used) for the duration of the COVID-19 declaration under Section 564(b)(1) of the Act, 21 U.S.C. section 360bbb-3(b)(1), unless the authorization is terminated or revoked.     Resp Syncytial Virus by PCR NEGATIVE NEGATIVE    Comment: (NOTE) Fact Sheet for Patients: BloggerCourse.com  Fact Sheet for Healthcare Providers: SeriousBroker.it  This test is not yet approved or  cleared by the [place] FDA and has been authorized for detection and/or diagnosis of SARS-CoV-2 by FDA under an Emergency Use Authorization (EUA). This EUA will remain in effect (meaning this test can be used) for the duration of the COVID-19 declaration under Section 564(b)(1) of the Act, 21 U.S.C. section 360bbb-3(b)(1), unless the authorization is terminated or revoked.  Performed at [facility], [address], [place], [place] [address]   Urinalysis, Routine w reflex microscopic -Urine, Clean Catch     Status: Abnormal   Collection Time: 06/26/23  3:20 PM  Result Value Ref Range   Color, Urine YELLOW YELLOW   APPearance CLOUDY (A) CLEAR   Specific Gravity, Urine 1.009 1.005 - 1.030   pH 7.0 5.0 - 8.0   Glucose, UA NEGATIVE NEGATIVE mg/dL   Hgb urine dipstick SMALL (A) NEGATIVE   Bilirubin Urine NEGATIVE NEGATIVE   Ketones, ur NEGATIVE NEGATIVE mg/dL   Protein, ur NEGATIVE NEGATIVE mg/dL   Nitrite NEGATIVE NEGATIVE   Leukocytes,Ua LARGE (A) NEGATIVE   RBC / HPF 0-5 0 - 5 RBC/hpf   WBC, UA 21-50 0 - 5 WBC/hpf   Bacteria, UA MANY (A) NONE SEEN   Squamous Epithelial / HPF 0-5 0 - 5 /HPF   Mucus PRESENT    Amorphous Crystal PRESENT     Comment: Performed at [facility], [address], [place], [place] [address]  CBC     Status: Abnormal   Collection Time: 06/26/23  3:46 PM  Result Value Ref Range   WBC 13.0 (H) 4.0 - 10.5 K/uL   RBC 4.34 4.22 - 5.81 MIL/uL   Hemoglobin 9.1 (L) 13.0 - 17.0 g/dL   HCT 57.8 (L) 46.9 - 62.9 %   MCV 71.4 (L) 80.0 - 100.0 fL   MCH 21.0 (L) 26.0 - 34.0 pg   MCHC 29.4 (L) 30.0 - 36.0 g/dL   RDW 52.8 (H) 41.3 - 24.4 %   Platelets 393 150 - 400 K/uL   nRBC 0.0 0.0 - 0.2 %    Comment: Performed at [facility], [address] [place], [place] [address]  Comprehensive metabolic panel     Status: Abnormal   Collection Time: 06/26/23  3:46 PM  Result Value Ref Range   Sodium 133 (L) 135 - 145 mmol/L   Potassium 5.2 (H)  3.5 - 5.1 mmol/L   Chloride 101 98 - 111 mmol/L   CO2 24 22 - 32 mmol/L   Glucose, Bld 102 (H) 70 - 99 mg/dL    Comment: Glucose reference range applies only to samples taken after fasting for at least 8 hours.   BUN 43 (H) 8 - 23 mg/dL   Creatinine, Ser 2.53 (H) 0.61 - 1.24 mg/dL   Calcium 9.0 8.9 - 66.4 mg/dL   Total Protein 6.5 6.5 - 8.1 g/dL   Albumin 2.9 (L) 3.5 - 5.0 g/dL   AST 25 15 - 41 U/L   ALT 15 0 - 44 U/L   Alkaline Phosphatase 58 38 - 126 U/L   Total Bilirubin  0.5 0.3 - 1.2 mg/dL   GFR, Estimated 29 (L) >60 mL/min    Comment: (NOTE) Calculated using the CKD-EPI Creatinine Equation (2021)    Anion gap 8 5 - 15    Comment: Performed at [facility], [address], [place], [place] [address]  Troponin I (High Sensitivity)     Status: None   Collection Time: 06/26/23  3:46 PM  Result Value Ref Range   Troponin I (High Sensitivity) 7 <18 ng/L    Comment: (NOTE) Elevated high sensitivity troponin I (hsTnI) values and significant  changes across serial measurements may suggest ACS but many other  chronic and acute conditions are known to elevate hsTnI results.  Refer to the "Links" section for chest pain algorithms and additional  guidance. Performed at [facility], [address] [place], [place] [address]   Brain natriuretic peptide     Status: Abnormal   Collection Time: 06/26/23  3:46 PM  Result Value Ref Range   B Natriuretic Peptide 111.2 (H) 0.0 - 100.0 pg/mL    Comment: Performed at [facility], [address] [place], [place] [address]  I-Stat Lactic Acid     Status: None   Collection Time: 06/26/23  5:42 PM  Result Value Ref Range   Lactic Acid, Venous 1.0 0.5 - 1.9 mmol/L   DG Chest Port 1 View  Result Date: 06/26/2023 CLINICAL DATA:  Hypoxia EXAM: PORTABLE CHEST 1 VIEW COMPARISON:  X-ray 06/04/2023 FINDINGS: Tiny left effusion. There is some patchy left mid to lower lung opacity. Acute process is possible. Recommend follow-up. No  pneumothorax. Right lung is without consolidation, pneumothorax or effusion. Normal cardiopericardial silhouette. Overlapping cardiac leads. Curvature and degenerative changes along the spine. IMPRESSION: Patchy left mid to lower lung opacity and small left effusion. Acute infiltrate is possible. Recommend follow-up to confirm clearance. Electronically Signed   By: Karen Kays M.D.   On: 06/26/2023 16:41    Pending Labs Unresulted Labs (From admission, onward)     Start     Ordered   06/26/23 1645  Culture, blood (routine x 2)  BLOOD CULTURE X 2,   R (with STAT occurrences)      06/26/23 1645   06/26/23 1645  Urine Culture  Once,   URGENT       Question:  Indication  Answer:  Altered mental status (if no other cause identified)   06/26/23 1645            Vitals/Pain Today's Vitals   06/26/23 1455 06/26/23 1501 06/26/23 1502 06/26/23 1530  BP: 122/71   (!) 111/94  Pulse: 65   63  Resp: 20   (!) 22  Temp: 98.1 F (36.7 C)     SpO2: (!) 82%  100% 98%  Weight:  80 kg    Height:  5\' 11"  (1.803 m)    PainSc:  0-No pain      Isolation Precautions No active isolations  Medications Medications  cefTRIAXone (ROCEPHIN) 1 g in sodium chloride 0.9 % 100 mL IVPB (has no administration in time range)  azithromycin (ZITHROMAX) 500 mg in sodium chloride 0.9 % 250 mL IVPB (has no administration in time range)    Mobility walks with person assist     Focused Assessments G/U   R Recommendations: See Admitting Provider Note  Report given to:   Additional Notes:  Working on Cultures and IV access

## 2023-06-27 DIAGNOSIS — J189 Pneumonia, unspecified organism: Secondary | ICD-10-CM | POA: Diagnosis not present

## 2023-06-27 MED ORDER — MOMETASONE FURO-FORMOTEROL FUM 200-5 MCG/ACT IN AERO
2.0000 | INHALATION_SPRAY | Freq: Two times a day (BID) | RESPIRATORY_TRACT | Status: DC
  Administered 2023-06-27 – 2023-06-28 (×3): 2 via RESPIRATORY_TRACT
  Filled 2023-06-27: qty 8.8

## 2023-06-27 MED ORDER — VITAMIN B-12 1000 MCG PO TABS
1000.0000 ug | ORAL_TABLET | Freq: Every day | ORAL | Status: DC
  Administered 2023-06-27 – 2023-06-28 (×2): 1000 ug via ORAL
  Filled 2023-06-27 (×2): qty 1

## 2023-06-27 MED ORDER — ESCITALOPRAM OXALATE 10 MG PO TABS
5.0000 mg | ORAL_TABLET | Freq: Every day | ORAL | Status: DC
  Administered 2023-06-27 – 2023-06-28 (×2): 5 mg via ORAL
  Filled 2023-06-27 (×2): qty 1

## 2023-06-27 MED ORDER — SALINE SPRAY 0.65 % NA SOLN: 1.0000 | NASAL | Status: DC | PRN

## 2023-06-27 MED ORDER — MEMANTINE HCL 10 MG PO TABS
10.0000 mg | ORAL_TABLET | Freq: Two times a day (BID) | ORAL | Status: DC
  Administered 2023-06-27 – 2023-06-28 (×3): 10 mg via ORAL
  Filled 2023-06-27 (×3): qty 1

## 2023-06-27 MED ORDER — VITAMIN D 25 MCG (1000 UNIT) PO TABS
2000.0000 [IU] | ORAL_TABLET | Freq: Every day | ORAL | Status: DC
  Administered 2023-06-27 – 2023-06-28 (×2): 2000 [IU] via ORAL
  Filled 2023-06-27 (×2): qty 2

## 2023-06-27 NOTE — Hospital Course (Signed)
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  Home health nurse noted that he had hypoxia with increased work of breathing so EMS was called in and patient was brought into the hospital.  In the ED, patient was noted to be hypoxic with pulse ox of 82% on room air. Initial labs showed leukocytosis, urinalysis with 21-50 white cells nitrate negative.  He had mild hypokalemia with potassium of 5.2 and sodium at 133.  COVID influenza and RSV was negative.  Lactate was 1.0.  Patient was then considered for admission to hospital for further evaluation and treatment.  Assessment/Plan  Community-acquired pneumonia with acute hypoxic respiratory failure.  Patient had shortness of breath leukocytosis with opacities on chest x-ray.  Continue Rocephin and Zithromax.  On 4 L of oxygen.  Wean as able.  Continue bronchodilators pulmonary toileting sputum culture.  Check Legionella and Streptococcus urinary antigen.  Hyperkalemia  Mild.  Will repeat BMP.   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  Creatinine yesterday at 2.2.  Check BMP today.  Will continue to monitor closely.  On normal saline 75 mL/h.   Hyperlipidemia Continue statins    Prostate cancer - s/p radical cystoprostatectomy and ileal conduit.  Continue to monitor.   Asthma Continue bronchodilators.   Vascular dementia Will resume Namenda and Lexapro from home.    Hypertension On amlodipine, metoprolol and HCTZ at home.  Borderline low  blood pressure so we will continue to hold.

## 2023-06-27 NOTE — Progress Notes (Addendum)
PROGRESS NOTE    Jerry [name]  ZOX:[id number] DOB: 1940-12-15 DOA: 06/26/2023 PCP: [name], MD    Brief Narrative:  Jerry [name] is a 82 y.o. 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  Home health nurse noted that he had hypoxia with increased work of breathing so EMS was called in and patient was brought into the hospital.  In the ED, patient was noted to be hypoxic with pulse ox of 82% on room air. Initial labs showed leukocytosis, urinalysis with 21-50 white cells, nitrite negative.  He had mild hyperkalemia with potassium of 5.2 and sodium at 133.  COVID influenza and RSV was negative.  Lactate was 1.0.  Patient was then considered for admission to hospital for further evaluation and treatment.  Assessment/Plan  Community-acquired streptococcal pneumonia with acute hypoxic respiratory failure.  X-ray of the chest with left lower lung opacity and effusion.  Patient had shortness of breath, leukocytosis with opacities on chest x-ray.  Patient was on Rocephin and Zithromax.  Discontinue Zithromax.  On 4 L of oxygen.  Wean as able.  Continue bronchodilators, inhalers.  Pulmonary toileting, sputum culture.  Positive Streptococcus urinary antigen.  Follow blood cultures.  Will continue to wean oxygen as able.  Patient's spouse does not state that he has any issues with swallowing.  Hyperkalemia  Mild.  Will repeat BMP.   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  Creatinine yesterday at 2.2.  Check BMP today.  Will continue to monitor closely.  On normal saline 75 mL/h.  Received need for fluids in AM.   Hyperlipidemia Continue statins    Prostate cancer s/p radical cystoprostatectomy and ileal conduit.  Continue to monitor.   Asthma Continue bronchodilators.  On  supplemental oxygen.  Continue to wean oxygen as able   Vascular dementia Will resume Namenda and Lexapro from home.    Hypertension On amlodipine, metoprolol and HCTZ at home.  Borderline low  blood pressure so we will continue to hold.     Progressive weakness, debility, recent falls.  Will get PT OT evaluation.     DVT prophylaxis: heparin injection 5,000 Units Start: 06/26/23 2200   Code Status:     Code Status: Full Code discussed with the patient's spouse about it.  She is going to talk to the rest of the family and let us know regarding goals of care in the future.  Disposition:  Uncertain at this time likely home with home health in 1 to 2 days.  Will need PT OT evaluation.  Status is: Inpatient  Remains inpatient appropriate because:  Pneumonia with hypoxic respiratory failure, IV antibiotic, pending clinical improvement   Family Communication:  Spoke with the patient's spouse on the phone and updated her about the clinical condition of the patient.  Consultants:  None  Procedures:  None  Antimicrobials:  Rocephin and Zithromax IV 8/13> will discontinue Zithromax  Anti-infectives (From admission, onward)    Start     Dose/Rate Route Frequency Ordered Stop   06/27/23 2100  azithromycin (ZITHROMAX) 500 mg in sodium chloride 0.9 % 250 mL IVPB        500 mg 250 mL/hr over 60 Minutes Intravenous Every 24 hours 06/26/23 1956 07/02/23 2059   06/27/23 2000  cefTRIAXone (ROCEPHIN) 2 g in sodium chloride 0.9 % 100 mL IVPB  2 g 200 mL/hr over 30 Minutes Intravenous Every 24 hours 06/26/23 1956 07/02/23 1959   06/26/23 1700  cefTRIAXone (ROCEPHIN) 1 g in sodium chloride 0.9 % 100 mL IVPB        1 g 200 mL/hr over 30 Minutes Intravenous  Once 06/26/23 1645 06/26/23 1931   06/26/23 1700  azithromycin (ZITHROMAX) 500 mg in sodium chloride 0.9 % 250 mL IVPB        500 mg 250 mL/hr over 60 Minutes Intravenous  Once 06/26/23 1645 06/26/23 2108       Subjective:  Today, patient was seen and examined at bedside.  Complains of mild cough with shortness of breath.  No chest pain.  Has a history of dementia.  Objective: Vitals:   06/26/23 1815 06/27/23 0136 06/27/23 0500 06/27/23 0743  BP:  120/67 (!) 95/56 120/73  Pulse: 73 67 60 67  Resp: (!) 24  16 16   Temp:  97.9 F (36.6 C) 98.9 F (37.2 C) 98.2 F (36.8 C)  TempSrc:  Oral  Oral  SpO2: 98% 98% 99% 98%  Weight:      Height:        Intake/Output Summary (Last 24 hours) at 06/27/2023 1005 Last data filed at 06/26/2023 2200 Gross per 24 hour  Intake --  Output 250 ml  Net -250 ml   Filed Weights   06/26/23 1501  Weight: 80 kg    Physical Examination: Body mass index is 24.6 kg/m.   General:  Average built, not in obvious distress HENT:   No scleral pallor or icterus noted. Oral mucosa is moist.  Chest:   Diminished breath sounds bilaterally.  Coarse breath sounds noted.  CVS: S1 &S2 heard. No murmur.  Regular rate and rhythm. Abdomen: Soft, nontender, nondistended.  Bowel sounds are heard.   Extremities: No cyanosis, clubbing or edema.  Peripheral pulses are palpable. Psych: Alert, awake and Communicative, has dementia CNS:  No cranial nerve deficits.  Moves all the extremities. Skin: Warm and dry.  No rashes noted.  Data Reviewed:   CBC: Recent Labs  Lab 06/26/23 1546 06/27/23 0933  WBC 13.0* 11.6*  HGB 9.1* 8.7*  HCT 31.0* 30.7*  MCV 71.4* 72.1*  PLT 393 421*    Basic Metabolic Panel: Recent Labs  Lab 06/26/23 1546  NA 133*  K 5.2*  CL 101  CO2 24  GLUCOSE 102*  BUN 43*  CREATININE 2.22*  CALCIUM 9.0    Liver Function Tests: Recent Labs  Lab 06/26/23 1546  AST 25  ALT 15  ALKPHOS 58  BILITOT 0.5  PROT 6.5  ALBUMIN 2.9*     Radiology Studies: DG Chest Port 1 View  Result Date: 06/26/2023 CLINICAL DATA:  Hypoxia EXAM: PORTABLE CHEST 1 VIEW COMPARISON:  X-ray 06/04/2023 FINDINGS: Tiny left effusion. There is some patchy left  mid to lower lung opacity. Acute process is possible. Recommend follow-up. No pneumothorax. Right lung is without consolidation, pneumothorax or effusion. Normal cardiopericardial silhouette. Overlapping cardiac leads. Curvature and degenerative changes along the spine. IMPRESSION: Patchy left mid to lower lung opacity and small left effusion. Acute infiltrate is possible. Recommend follow-up to confirm clearance. Electronically Signed   By: Karen Kays M.D.   On: 06/26/2023 16:41      LOS: 1 day    Joycelyn Das, MD [facility] Available via Epic secure chat 7am-7pm After these hours, please refer to coverage provider listed on [email or web address] 06/27/2023, 10:05 AM

## 2023-06-27 NOTE — Evaluation (Signed)
Occupational Therapy Evaluation Patient Details Name: Jerry [name] MRN: [id number] DOB: May 31, 1941 Today's Date: 06/27/2023   History of Present Illness Patient is an 82 yo male presenting to the ED with SOB and low oxygen in the 80% range on 06/26/23. Patient placed on 4LO2, admitted with pneumonia. 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 Wife endorses a functional decline over the last two weeks, requiring increased assist to complete ADLs and functional mobility with a rolling walker. Currently, patient presenting with need for increased oxygen to complete functional tasks (4L), weakness, and need for increased assist to complete ADLs. Patient mod A for ADLs, however transfers not assessed as patient was fatigued from BM and rolling in bed, but able to complete incremental scoots toward HOB at contact guard level, requiring increased time to recover with pursed lip breathing encouraged. OT recommending continuance of HHOT at discharge, OT will continue to follow acutely.       If plan is discharge home, recommend the following: A little help with walking and/or transfers;A lot of help with bathing/dressing/bathroom;Direct supervision/assist for financial management;Direct supervision/assist for medications management;Assist for transportation;Help with stairs or ramp for entrance;Supervision due to cognitive status    Functional Status Assessment  Patient has had a recent decline in their functional status and demonstrates the ability to make significant improvements in function in a reasonable and predictable amount of time.  Equipment Recommendations  None recommended by OT (Patient has DME needed)    Recommendations for Other Services       Precautions /  Restrictions Precautions Precautions: Fall Restrictions Weight Bearing Restrictions: No      Mobility Bed Mobility Overal bed mobility: Needs Assistance Bed Mobility: Supine to Sit, Sit to Supine     Supine to sit: Contact guard Sit to supine: Contact guard assist   General bed mobility comments: contact gaurd assist, minimal extra time needed, use of bed rail    Transfers                   General transfer comment: fatigued from BM and rolling, able to complete incremental scoots toward HOB, requiring increased time to recover with pursed lip breathing encouraged      Balance Overall balance assessment: Mild deficits observed, not formally tested                                         ADL either performed or assessed with clinical judgement   ADL Overall ADL's : Needs assistance/impaired Eating/Feeding: Set up;Sitting   Grooming: Set up;Sitting   Upper Body Bathing: Minimal assistance;Sitting   Lower Body Bathing: Moderate assistance;Sit to/from stand;Sitting/lateral leans   Upper Body Dressing : Minimal assistance;Sitting   Lower Body Dressing: Moderate assistance;Sitting/lateral leans;Sit to/from stand   Toilet Transfer: Total assistance Toilet Transfer Details (indicate cue type and reason): bed pan BM Toileting- Clothing Manipulation and Hygiene: Total assistance;Bed level       Functional mobility during ADLs: Moderate assistance;Cueing for sequencing;Cueing for safety General ADL Comments: Patient presenting with need for increased oxygen to complete functional tasks (4L), weakness, and need for increased assist to complete ADLs.     Vision Baseline Vision/History: 1 Wears glasses Ability to See in Adequate  Light: 0 Adequate Patient Visual Report: No change from baseline Vision Assessment?: No apparent visual deficits     Perception         Praxis         Pertinent Vitals/Pain Pain Assessment Pain Assessment:  Faces Pain Score: 0-No pain Faces Pain Scale: No hurt     Extremity/Trunk Assessment Upper Extremity Assessment Upper Extremity Assessment: Generalized weakness   Lower Extremity Assessment Lower Extremity Assessment: Defer to PT evaluation   Cervical / Trunk Assessment Cervical / Trunk Assessment: Normal   Communication Communication Communication: No apparent difficulties   Cognition Arousal: Lethargic Behavior During Therapy: Flat affect Overall Cognitive Status: History of cognitive impairments - at baseline                                 General Comments: Patient alert with the exception of time, flat affect, increased cues to participate     General Comments  4L/5L O2    Exercises     Shoulder Instructions      Home Living Family/patient expects to be discharged to:: Private residence Living Arrangements: Spouse/significant other Available Help at Discharge: Available 24 hours/day Type of Home: House Home Access: Stairs to enter Secretary/administrator of Steps: 1 Entrance Stairs-Rails: Right Home Layout: One level     Bathroom Shower/Tub: Producer, television/film/video: Handicapped height     Home Equipment: Grab bars - toilet;Shower seat;Grab bars - Chartered loss adjuster (2 wheels)   Additional Comments: Has HHOT HHPT, and nurse come out currently      Prior Functioning/Environment Prior Level of Function : Needs assist             Mobility Comments: walks with a RW ADLs Comments: has needed more help within the last two weeks, assist for all ADLs        OT Problem List: Decreased strength;Decreased activity tolerance;Decreased range of motion;Impaired balance (sitting and/or standing);Decreased safety awareness      OT Treatment/Interventions: Self-care/ADL training;Therapeutic exercise;Energy conservation;DME and/or AE instruction;Therapeutic activities;Patient/family education;Balance training    OT Goals(Current  goals can be found in the care plan section) Acute Rehab OT Goals Patient Stated Goal: to go home OT Goal Formulation: With patient Time For Goal Achievement: 07/11/23 Potential to Achieve Goals: Good  OT Frequency: Min 1X/week    Co-evaluation              AM-PAC OT "6 Clicks" Daily Activity     Outcome Measure Help from another person eating meals?: None Help from another person taking care of personal grooming?: None Help from another person toileting, which includes using toliet, bedpan, or urinal?: A Lot Help from another person bathing (including washing, rinsing, drying)?: A Lot Help from another person to put on and taking off regular upper body clothing?: A Little Help from another person to put on and taking off regular lower body clothing?: A Lot 6 Click Score: 17   End of Session Equipment Utilized During Treatment: Oxygen Nurse Communication: Mobility status  Activity Tolerance: Patient tolerated treatment well Patient left: in bed;with call bell/phone within reach;with bed alarm set  OT Visit Diagnosis: Unsteadiness on feet (R26.81);Other abnormalities of gait and mobility (R26.89);Muscle weakness (generalized) (M62.81)                Time: 6213-0865 OT Time Calculation (min): 32 min Charges:  OT General Charges $OT Visit: 1 Visit OT Evaluation $OT Eval  Moderate Complexity: 1 Mod OT Treatments $Self Care/Home Management : 8-22 mins  Pollyann Glen E. , OTR/L Acute Rehabilitation Services [phone]   Cherlyn Cushing 06/27/2023, 3:51 PM

## 2023-06-27 NOTE — Progress Notes (Signed)
   06/27/23 1400  Spiritual Encounters  Type of Visit Initial  Care provided to: Pt and family  Referral source Patient request  Reason for visit Advance directives  OnCall Visit No   Ch responded to request for AD. Pt's family was at bedside. Ch assisted pt with AD education. Pt will page Ch when he is ready to complete AD. Ch remains available.

## 2023-06-27 NOTE — Plan of Care (Signed)

## 2023-06-27 NOTE — Evaluation (Signed)
Physical Therapy Evaluation Patient Details Name: Jerry [name] MRN: [id number] DOB: 08-27-1941 Today's Date: 06/27/2023  History of Present Illness  Patient is an 82 yo male presenting to the ED with SOB and low oxygen in the 80% range on 06/26/23. Patient placed on 4LO2, admitted with pneumonia. 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 Pt fatigues quickly when mobilizing despite still being on supplemental oxygen this session. At rest pt appears to tolerate 2L supplemental oxygen well, but desaturates when mobilizing. PT encourages frequent mobilization with staff assistance. PT recommends continued HHPT and assistance from spouse.        If plan is discharge home, recommend the following: A little help with walking and/or transfers;A little help with bathing/dressing/bathroom;Assistance with cooking/housework;Direct supervision/assist for medications management;Direct supervision/assist for financial management;Assist for transportation;Help with stairs or ramp for entrance;Supervision due to cognitive status   Can travel by private vehicle        Equipment Recommendations None recommended by PT  Recommendations for Other Services       Functional Status Assessment Patient has had a recent decline in their functional status and demonstrates the ability to make significant improvements in function in a reasonable and predictable amount of time.     Precautions / Restrictions Precautions Precautions: Fall Precaution Comments: monitor sats Restrictions Weight Bearing Restrictions: No      Mobility  Bed Mobility Overal bed mobility: Needs Assistance Bed Mobility: Supine to Sit, Sit to Supine     Supine to sit: Supervision Sit to  supine: Contact guard assist        Transfers Overall transfer level: Needs assistance Equipment used: Rolling walker (2 wheels), 1 person hand held assist Transfers: Sit to/from Stand, Bed to chair/wheelchair/BSC Sit to Stand: Contact guard assist   Step pivot transfers: Contact guard assist            Ambulation/Gait Ambulation/Gait assistance: Contact guard assist Gait Distance (Feet): 60 Feet Assistive device: Rolling walker (2 wheels) Gait Pattern/deviations: Step-through pattern Gait velocity: reduced Gait velocity interpretation: <1.8 ft/sec, indicate of risk for recurrent falls   General Gait Details: slowed step-through gait, decreasing speed with fatigue  Stairs            Wheelchair Mobility     Tilt Bed    Modified Rankin (Stroke Patients Only)       Balance Overall balance assessment: Needs assistance Sitting-balance support: Feet supported, No upper extremity supported Sitting balance-Leahy Scale: Good     Standing balance support: Single extremity supported, Reliant on assistive device for balance Standing balance-Leahy Scale: Poor                               Pertinent Vitals/Pain Pain Assessment Pain Assessment: No/denies pain    Home Living Family/patient expects to be discharged to:: Private residence Living Arrangements: Spouse/significant other Available Help at Discharge: Available 24 hours/day Type of Home: House Home Access: Stairs to enter Entrance Stairs-Rails: Right Entrance Stairs-Number of Steps: 1   Home Layout: One level Home Equipment: Grab bars - toilet;Shower seat;Grab bars - Chartered loss adjuster (2 wheels) Additional Comments: Has HHOT HHPT, and nurse come out currently    Prior Function Prior Level of Function : Needs assist  Mobility Comments: walks with a RW ADLs Comments: has needed more help within the last two weeks, assist for all ADLs     Extremity/Trunk  Assessment   Upper Extremity Assessment Upper Extremity Assessment: Defer to OT evaluation    Lower Extremity Assessment Lower Extremity Assessment: Overall WFL for tasks assessed    Cervical / Trunk Assessment Cervical / Trunk Assessment: Normal  Communication   Communication Communication: No apparent difficulties Cueing Techniques: Verbal cues  Cognition Arousal: Alert Behavior During Therapy: Flat affect Overall Cognitive Status: History of cognitive impairments - at baseline                                 General Comments: alert, oriented to person, place and situation. Follows commands well. Fair awareness of deficits.        General Comments General comments (skin integrity, edema, etc.): pt on 4L [place] upon arrival, weaned to 2L [place] with sats in low 90s at rest. Pt desats to 87% when mobilizing on 2L [place]. PT left pt on 3L [place] when resting at end of session    Exercises     Assessment/Plan    PT Assessment Patient needs continued PT services  PT Problem List Decreased strength;Decreased activity tolerance;Decreased balance;Decreased mobility;Cardiopulmonary status limiting activity       PT Treatment Interventions DME instruction;Gait training;Stair training;Functional mobility training;Therapeutic activities;Therapeutic exercise;Balance training;Neuromuscular re-education;Patient/family education    PT Goals (Current goals can be found in the Care Plan section)  Acute Rehab PT Goals Patient Stated Goal: to improve activity tolerance PT Goal Formulation: With patient/family Time For Goal Achievement: 07/11/23 Potential to Achieve Goals: Fair Additional Goals Additional Goal #1: Pt will report 1/4 DOE or less when mobilizing on room air to demonstrate improved activity tolerance    Frequency Min 1X/week     Co-evaluation               AM-PAC PT "6 Clicks" Mobility  Outcome Measure Help needed turning from your back to your side while in a  flat bed without using bedrails?: A Little Help needed moving from lying on your back to sitting on the side of a flat bed without using bedrails?: A Little Help needed moving to and from a bed to a chair (including a wheelchair)?: A Little Help needed standing up from a chair using your arms (e.g., wheelchair or bedside chair)?: A Little Help needed to walk in hospital room?: A Little Help needed climbing 3-5 steps with a railing? : A Lot 6 Click Score: 17    End of Session Equipment Utilized During Treatment: Oxygen Activity Tolerance: Patient limited by fatigue Patient left: in bed;with call bell/phone within reach;with bed alarm set;with family/visitor present Nurse Communication: Mobility status PT Visit Diagnosis: Other abnormalities of gait and mobility (R26.89);Muscle weakness (generalized) (M62.81)    Time: 3244-0102 PT Time Calculation (min) (ACUTE ONLY): 30 min   Charges:   PT Evaluation $PT Eval Low Complexity: 1 Low   PT General Charges $$ ACUTE PT VISIT: 1 Visit         Arlyss Gandy, PT, DPT Acute Rehabilitation Office [phone]   Arlyss Gandy 06/27/2023, 4:35 PM

## 2023-06-27 NOTE — TOC Initial Note (Signed)
Transition of Care [facility]) - Initial/Assessment Note    Patient Details  Name: Jerry [name] MRN: [id number] Date of Birth: 07/22/1941  Transition of Care [facility]) CM/SW Contact:    Janae Bridgeman, RN Phone Number: 06/27/2023, 12:13 PM  Clinical Narrative:                 CM met with the patient and wife at the bedside to discuss TOC needs.  The patient was admitted to the hospital for pneumonia.  The patient lives with the wife at the home and plans to return home when medically stable to discharge.  Patient currently has oxygen at the hospital at 4L/min [place].  The patient smokes cigarettes and uses marijuana at the home.  Smoking cessation was provided the bedside at the patient was receptive to benefits of cessation.  Resources to be provided in the AVS.  The patient is active with [facility] - HH orders will be placed to add aide to the current services for PT, OT and RN.  CM called and spoke with Kandee Keen, CM at [facility] and he will continue to follow the patient for continuation of services.  Wife states that patient states that he has a HCPOA and she plans to check at the MD office for copy.  Patient is not medically stable to discharge at this time.  CM will continue to follow for needs to return to home with [facility] services.  The patient is active with  Expected Discharge Plan: Home w Home Health Services Barriers to Discharge: Continued Medical Work up   Patient Goals and CMS Choice Patient states their goals for this hospitalization and ongoing recovery are:: To return home CMS Medicare.gov Compare Post Acute Care list provided to:: Patient Represenative (must comment) (Wife at bedside) Choice offered to / list presented to : Spouse [place] ownership interest in [facility].provided to:: Spouse    Expected Discharge Plan and Services   Discharge Planning Services: CM Consult Post Acute Care Choice: Resumption of Svcs/PTA Provider, Home Health Living arrangements for the  past 2 months: Single Family Home                           HH Arranged: RN, PT, OT, Nurse's Aide HH Agency: [facility] Date [facility] Agency Contacted: 06/27/23 Time HH Agency Contacted: [id number] Representative spoke with at [facility] Agency: [facility] - active for RN, PT, OT - Aide added  Prior Living Arrangements/Services Living arrangements for the past 2 months: Single Family Home Lives with:: Spouse Patient language and need for interpreter reviewed:: Yes Do you feel safe going back to the place where you live?: Yes      Need for Family Participation in Patient Care: Yes (Comment) Care giver support system in place?: Yes (comment) Current home services: DME (Current DME at the home includes Cane, RW, grab bars, 3:1) Criminal Activity/Legal Involvement Pertinent to Current Situation/Hospitalization: No - Comment as needed  Activities of Daily Living Home Assistive Devices/Equipment: Environmental consultant (specify type) ADL Screening (condition at time of admission) Patient's cognitive ability adequate to safely complete daily activities?: Yes Is the patient deaf or have difficulty hearing?: No Does the patient have difficulty seeing, even when wearing glasses/contacts?: No Does the patient have difficulty concentrating, remembering, or making decisions?: No Patient able to express need for assistance with ADLs?: Yes Does the patient have difficulty dressing or bathing?: Yes Independently performs ADLs?: No Communication: Independent Dressing (OT): Needs  assistance Is this a change from baseline?: Pre-admission baseline Grooming: Needs assistance Is this a change from baseline?: Pre-admission baseline Feeding: Independent Bathing: Needs assistance Is this a change from baseline?: Pre-admission baseline Toileting: Needs assistance Is this a change from baseline?: Pre-admission baseline In/Out Bed: Needs assistance Is this a change from baseline?: Pre-admission baseline Walks in Home:  Independent with device (comment) (walker) Does the patient have difficulty walking or climbing stairs?: Yes Weakness of Legs: Both Weakness of Arms/Hands: Both  Permission Sought/Granted Permission sought to share information with : Case Manager, Family Supports, Oceanographer granted to share information with : Yes, Verbal Permission Granted     Permission granted to share info w AGENCY: [facility]  Permission granted to share info w Relationship: wife at bedside     Emotional Assessment Appearance:: Appears stated age Attitude/Demeanor/Rapport: Gracious Affect (typically observed): Accepting Orientation: : Oriented to Self, Oriented to Place, Oriented to  Time, Oriented to Situation Alcohol / Substance Use: Tobacco Use Psych Involvement: No (comment)  Admission diagnosis:  CAP (community acquired pneumonia) [J18.9] Patient Active Problem List   Diagnosis Date Noted   CAP (community acquired pneumonia) 06/26/2023   History of malignant neoplasm of bladder 07/21/2021   Allergic rhinitis 07/21/2021   Asthma 07/21/2021   Benign essential hypertension 07/21/2021   Degenerative cervical spinal stenosis 07/21/2021   Mixed hyperlipidemia 07/21/2021   Tremor 07/21/2021   Vitamin D deficiency 07/21/2021   OSA (obstructive sleep apnea) 07/12/2021   Asthma, mild intermittent    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   Syncope 04/17/2015   Anemia 04/17/2015   Hyperglycemia 04/17/2015   Renal insufficiency 04/17/2015   Faintness    PCP:  [name], MD Pharmacy:   [id number] [place], [place] - [address] [place] [address] Phone: [phone] Fax: [phone]     Social Determinants of Health (SDOH) Social History: SDOH Screenings   Food Insecurity: No Food Insecurity (06/26/2023)  Housing: Low Risk  (06/26/2023)  Transportation Needs: No Transportation Needs (06/26/2023)   Utilities: Not At Risk (06/26/2023)  Social Connections: Unknown (12/20/2022)   [facility]  Tobacco Use: Low Risk  (06/26/2023)   SDOH Interventions:     Readmission Risk Interventions    06/27/2023   12:12 PM  Readmission Risk Prevention Plan  Transportation Screening Complete  PCP or Specialist Appt within 5-7 Days Complete  Home Care Screening Complete  Medication Review (RN CM) Complete

## 2023-06-28 DIAGNOSIS — E440 Moderate protein-calorie malnutrition: Secondary | ICD-10-CM

## 2023-06-28 DIAGNOSIS — J189 Pneumonia, unspecified organism: Secondary | ICD-10-CM | POA: Diagnosis not present

## 2023-06-28 HISTORY — DX: Moderate protein-calorie malnutrition: E44.0

## 2023-06-28 LAB — CBC
HCT: 27.6 % — ABNORMAL LOW (ref 39.0–52.0)
Hemoglobin: 7.8 g/dL — ABNORMAL LOW (ref 13.0–17.0)
MCH: 20.2 pg — ABNORMAL LOW (ref 26.0–34.0)
MCHC: 28.3 g/dL — ABNORMAL LOW (ref 30.0–36.0)
MCV: 71.5 fL — ABNORMAL LOW (ref 80.0–100.0)
Platelets: 365 10*3/uL (ref 150–400)
RBC: 3.86 MIL/uL — ABNORMAL LOW (ref 4.22–5.81)
RDW: 22.1 % — ABNORMAL HIGH (ref 11.5–15.5)
WBC: 9.4 10*3/uL (ref 4.0–10.5)
nRBC: 0 % (ref 0.0–0.2)

## 2023-06-28 LAB — BASIC METABOLIC PANEL
Anion gap: 9 (ref 5–15)
BUN: 32 mg/dL — ABNORMAL HIGH (ref 8–23)
CO2: 24 mmol/L (ref 22–32)
Calcium: 8.3 mg/dL — ABNORMAL LOW (ref 8.9–10.3)
Chloride: 103 mmol/L (ref 98–111)
Creatinine, Ser: 1.79 mg/dL — ABNORMAL HIGH (ref 0.61–1.24)
GFR, Estimated: 38 mL/min — ABNORMAL LOW (ref >60)
Glucose, Bld: 107 mg/dL — ABNORMAL HIGH (ref 70–99)
Potassium: 4.8 mmol/L (ref 3.5–5.1)
Sodium: 136 mmol/L (ref 135–145)

## 2023-06-28 LAB — MAGNESIUM: Magnesium: 2.2 mg/dL (ref 1.7–2.4)

## 2023-06-28 MED ORDER — CEFDINIR 300 MG PO CAPS: 300.0000 mg | ORAL_CAPSULE | Freq: Two times a day (BID) | ORAL | 0 refills | Status: AC

## 2023-06-28 MED ORDER — RENA-VITE PO TABS: 1.0000 | ORAL_TABLET | Freq: Every day | ORAL | Status: DC

## 2023-06-28 MED ORDER — CEFDINIR 300 MG PO CAPS: 300.0000 mg | ORAL_CAPSULE | Freq: Every day | ORAL | 0 refills | Status: DC

## 2023-06-28 NOTE — Progress Notes (Signed)
Physical Therapy Treatment Patient Details Name: Jerry [name] MRN: [id number] DOB: 24-Dec-1940 Today's Date: 06/28/2023   History of Present Illness Patient is an 82 yo male presenting to the ED with SOB and low oxygen in the 80% range on 06/26/23. Patient placed on 4LO2, admitted with pneumonia. 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 Pt able to progress gait distance this session with CGA and RW for support with SpO2 maintaining >90% on RA throughout. Pt needing grossly supervision to CGA for bed mobility and functional transfers with pt demonstrating good safety awareness. Pt was educated on continued walker use to maximize functional independence, safety, and decrease risk for falls as well as appropriate activity progression. Pt continues to benefit from skilled PT services to progress toward functional mobility goals.      If plan is discharge home, recommend the following: A little help with walking and/or transfers;A little help with bathing/dressing/bathroom;Assistance with cooking/housework;Direct supervision/assist for medications management;Direct supervision/assist for financial management;Assist for transportation;Help with stairs or ramp for entrance;Supervision due to cognitive status   Can travel by private vehicle        Equipment Recommendations  None recommended by PT    Recommendations for Other Services       Precautions / Restrictions Precautions Precautions: Fall Precaution Comments: monitor sats Restrictions Weight Bearing Restrictions: No     Mobility  Bed Mobility Overal bed mobility: Needs Assistance Bed Mobility: Supine to Sit     Supine to sit: Supervision     General bed mobility comments: supervision for safety     Transfers Overall transfer level: Needs assistance Equipment used: Rolling walker (2 wheels), 1 person hand held assist Transfers: Sit to/from Stand, Bed to chair/wheelchair/BSC Sit to Stand: Contact guard assist           General transfer comment: CGA for safety, no physica assist needed, pt with good hand placement    Ambulation/Gait Ambulation/Gait assistance: Contact guard assist Gait Distance (Feet): 120 Feet Assistive device: Rolling walker (2 wheels) Gait Pattern/deviations: Step-through pattern Gait velocity: reduced     General Gait Details: slowed step-through gait, decreasing speed with fatigue, SpO2 >90% on Ra throughout   Stairs             Wheelchair Mobility     Tilt Bed    Modified Rankin (Stroke Patients Only)       Balance Overall balance assessment: Needs assistance Sitting-balance support: Feet supported, No upper extremity supported Sitting balance-Leahy Scale: Good     Standing balance support: Single extremity supported, Reliant on assistive device for balance Standing balance-Leahy Scale: Poor Standing balance comment: reliant on RW support                            Cognition Arousal: Alert Behavior During Therapy: Flat affect Overall Cognitive Status: History of cognitive impairments - at baseline                                 General Comments: alert, oriented to person, place and situation. Follows commands well. Fair awareness of deficits.        Exercises      General Comments General comments (skin integrity, edema, etc.): VSS on RA  throughout, see saturations qualifications note      Pertinent Vitals/Pain Pain Assessment Pain Assessment: No/denies pain Faces Pain Scale: No hurt    Home Living                          Prior Function            PT Goals (current goals can now be found in the care plan section) Acute Rehab PT Goals Patient Stated Goal: to improve  activity tolerance PT Goal Formulation: With patient/family Time For Goal Achievement: 07/11/23 Progress towards PT goals: Progressing toward goals    Frequency    Min 1X/week      PT Plan      Co-evaluation              AM-PAC PT "6 Clicks" Mobility   Outcome Measure  Help needed turning from your back to your side while in a flat bed without using bedrails?: A Little Help needed moving from lying on your back to sitting on the side of a flat bed without using bedrails?: A Little Help needed moving to and from a bed to a chair (including a wheelchair)?: A Little Help needed standing up from a chair using your arms (e.g., wheelchair or bedside chair)?: A Little Help needed to walk in hospital room?: A Little Help needed climbing 3-5 steps with a railing? : A Lot 6 Click Score: 17    End of Session   Activity Tolerance: Patient tolerated treatment well;Patient limited by fatigue Patient left: in bed;with call bell/phone within reach;with family/visitor present (seated up EOB) Nurse Communication: Mobility status PT Visit Diagnosis: Other abnormalities of gait and mobility (R26.89);Muscle weakness (generalized) (M62.81)     Time: 8469-6295 PT Time Calculation (min) (ACUTE ONLY): 27 min  Charges:    $Gait Training: 8-22 mins $Therapeutic Activity: 8-22 mins PT General Charges $$ ACUTE PT VISIT: 1 Visit                      R. PTA Acute Rehabilitation Services Office: [phone]   Catalina Antigua 06/28/2023, 2:48 PM

## 2023-06-28 NOTE — Progress Notes (Signed)
SATURATION QUALIFICATIONS: (This note is used to comply with regulatory documentation for home oxygen)  Patient Saturations on Room Air at Rest = 97%  Patient Saturations on Room Air while Ambulating = 91%  Please briefly explain why patient needs home oxygen: N/A Pt able to maintain safe saturation level on room air with mobility.   Lenora Boys. PTA Acute Rehabilitation Services Office: [phone]

## 2023-06-28 NOTE — Plan of Care (Signed)

## 2023-06-28 NOTE — Discharge Summary (Signed)
Physician Discharge Summary  Jerry [name] JJO:[id number] DOB: 1941/01/06 DOA: 06/26/2023  PCP: [name], MD  Admit date: 06/26/2023 Discharge date: 06/28/2023  Admitted From: Home  Discharge disposition: Home   Recommendations for Outpatient Follow-Up:   Follow up with your primary care provider in one week.  Check CBC, BMP, magnesium in the next visit  Discharge Diagnosis:   Principal Problem:   CAP (community acquired pneumonia) Active Problems:   Malnutrition of moderate degree  Discharge Condition: Improved.  Diet recommendation: Regular.  Wound care: None.  Code status: Full.   History of Present Illness:   Jerry [name] is a 82 y.o. 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  Home health nurse noted that he had hypoxia with increased work of breathing so EMS was called in and patient was brought into the hospital.  In the ED, patient was noted to be hypoxic with pulse ox of 82% on room air. Initial labs showed leukocytosis, urinalysis with 21-50 white cells, nitrite negative.  He had mild hyperkalemia with potassium of 5.2 and sodium at 133.  COVID influenza and RSV was negative.  Lactate was 1.0.  Patient was then considered for admission to hospital for further evaluation and treatment.   Hospital Course:   Following conditions were addressed during hospitalization as listed below,  Community-acquired streptococcal pneumonia with acute hypoxic respiratory failure.   X-ray of the chest with left lower lung opacity and effusion.  Patient had shortness of breath, leukocytosis with opacities on chest x-ray.  Patient received Rocephin and Zithromax hospitalization and subsequently was changed to Rocephin only due to Streptococcus in urine.  Patient received  bronchodilators inhalers..  Patient is afebrile without leukocytosis.  At this time is stable for disposition home with oral antibiotic to complete 5-day course.  Patient has been weaned off at this time.   Hyperkalemia  Improved.  Latest potassium of 4.8.   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  Creatinine today better at 1.7 from initial 2.2.  Check BMP today.  Received normal saline 75 mL/h during hospitalization..  Will hold off with HCTZ on discharge.     Hyperlipidemia Continue statins    Prostate cancer s/p radical cystoprostatectomy and ileal conduit.  Continue to monitor.   Asthma Continue bronchodilators.     Vascular dementia Continue Namenda and Lexapro from home.    Hypertension On amlodipine, metoprolol and HCTZ at home.  Borderline low  blood pressure so on presentation but will resume on discharge.  Will however discontinue HCTZ on discharge.   Progressive weakness, debility, recent falls.  PT OT recommends home health on discharge.  Disposition.  At this time, patient is stable for disposition home with outpatient PCP follow-up.  Spoke with the patient's wife regarding disposition.  Medical Consultants:   None.  Procedures:    None Subjective:   Today, patient was seen and examined at bedside.  Feels okay.  Denies any cough, chest pain, shortness of breath or dyspnea.  Was able to ambulate without hypoxia.  Discharge Exam:   Vitals:   06/28/23 0500 06/28/23 0806  BP: 119/60 137/66  Pulse: 70 72  Resp: 18 16  Temp: 98.5 F (36.9 C) 98.1 F (36.7 C)  SpO2: 93% 92%   Vitals:   06/27/23 2005 06/27/23 2353 06/28/23 0500 06/28/23 0806  BP:  108/62 119/60 137/66  Pulse: 75 75 70 72  Resp: 16 17 18 16   Temp:   98.5 F (36.9 C) 98.1 F (36.7 C)  TempSrc:    Oral  SpO2:  95% 93% 92%  Weight:      Height:       Body mass index is 24.6 kg/m.  General: Alert awake, not in obvious distress, on room air HENT: pupils equally  reacting to light,  No scleral pallor or icterus noted. Oral mucosa is moist.  Chest:  Diminished breath sounds bilaterally. No crackles or wheezes.  CVS: S1 &S2 heard. No murmur.  Regular rate and rhythm. Abdomen: Soft, nontender, nondistended.  Bowel sounds are heard.   Extremities: No cyanosis, clubbing or edema.  Peripheral pulses are palpable. Psych: Alert, awake and Communicative, has underlying dementia. CNS:  No cranial nerve deficits.  Power equal in all extremities.   Skin: Warm and dry.  No rashes noted.  The results of significant diagnostics from this hospitalization (including imaging, microbiology, ancillary and laboratory) are listed below for reference.     Diagnostic Studies:   DG Chest Port 1 View  Result Date: 06/26/2023 CLINICAL DATA:  Hypoxia EXAM: PORTABLE CHEST 1 VIEW COMPARISON:  X-ray 06/04/2023 FINDINGS: Tiny left effusion. There is some patchy left mid to lower lung opacity. Acute process is possible. Recommend follow-up. No pneumothorax. Right lung is without consolidation, pneumothorax or effusion. Normal cardiopericardial silhouette. Overlapping cardiac leads. Curvature and degenerative changes along the spine. IMPRESSION: Patchy left mid to lower lung opacity and small left effusion. Acute infiltrate is possible. Recommend follow-up to confirm clearance. Electronically Signed   By: Karen Kays M.D.   On: 06/26/2023 16:41     Labs:   Basic Metabolic Panel: Recent Labs  Lab 06/26/23 1546 06/27/23 0933 06/28/23 0105  NA 133* 140 136  K 5.2* 4.5 4.8  CL 101 105 103  CO2 24 26 24   GLUCOSE 102* 107* 107*  BUN 43* 36* 32*  CREATININE 2.22* 1.98* 1.79*  CALCIUM 9.0 8.9 8.3*  MG  --   --  2.2   GFR Estimated Creatinine Clearance: 35.1 mL/min (A) (by C-G formula based on SCr of 1.79 mg/dL (H)). Liver Function Tests: Recent Labs  Lab 06/26/23 1546  AST 25  ALT 15  ALKPHOS 58  BILITOT 0.5  PROT 6.5  ALBUMIN 2.9*   No results for input(s):  "LIPASE", "AMYLASE" in the last 168 hours. No results for input(s): "AMMONIA" in the last 168 hours. Coagulation profile No results for input(s): "INR", "PROTIME" in the last 168 hours.  CBC: Recent Labs  Lab 06/26/23 1546 06/27/23 0933 06/28/23 0105  WBC 13.0* 11.6* 9.4  HGB 9.1* 8.7* 7.8*  HCT 31.0* 30.7* 27.6*  MCV 71.4* 72.1* 71.5*  PLT 393 421* 365   Cardiac Enzymes: No results for input(s): "CKTOTAL", "CKMB", "CKMBINDEX", "TROPONINI" in the last 168 hours. BNP: Invalid input(s): "POCBNP" CBG: No results for input(s): "GLUCAP" in the last 168 hours. D-Dimer No results for input(s): "DDIMER" in the last 72 hours. Hgb A1c No results for input(s): "HGBA1C" in the last 72 hours. Lipid Profile No results for input(s): "CHOL", "HDL", "LDLCALC", "TRIG", "CHOLHDL", "LDLDIRECT" in the last 72 hours. Thyroid function studies No results for input(s): "TSH", "T4TOTAL", "T3FREE", "THYROIDAB" in the last 72 hours.  Invalid input(s): "FREET3" Anemia work up No results for input(s): "VITAMINB12", "FOLATE", "FERRITIN", "TIBC", "IRON", "RETICCTPCT" in the last 72 hours. Microbiology Recent Results (from the past 240 hour(s))  Resp panel by RT-PCR (RSV,  Flu A&B, Covid) Anterior Nasal Swab     Status: None   Collection Time: 06/26/23  3:20 PM   Specimen: Anterior Nasal Swab  Result Value Ref Range Status   SARS Coronavirus 2 by RT PCR NEGATIVE NEGATIVE Final   Influenza A by PCR NEGATIVE NEGATIVE Final   Influenza B by PCR NEGATIVE NEGATIVE Final    Comment: (NOTE) The Xpert Xpress SARS-CoV-2/FLU/RSV plus assay is intended as an aid in the diagnosis of influenza from Nasopharyngeal swab specimens and should not be used as a sole basis for treatment. Nasal washings and aspirates are unacceptable for Xpert Xpress SARS-CoV-2/FLU/RSV testing.  Fact Sheet for Patients: BloggerCourse.com  Fact Sheet for Healthcare  Providers: SeriousBroker.it  This test is not yet approved or cleared by the [place] FDA and has been authorized for detection and/or diagnosis of SARS-CoV-2 by FDA under an Emergency Use Authorization (EUA). This EUA will remain in effect (meaning this test can be used) for the duration of the COVID-19 declaration under Section 564(b)(1) of the Act, 21 U.S.C. section 360bbb-3(b)(1), unless the authorization is terminated or revoked.     Resp Syncytial Virus by PCR NEGATIVE NEGATIVE Final    Comment: (NOTE) Fact Sheet for Patients: BloggerCourse.com  Fact Sheet for Healthcare Providers: SeriousBroker.it  This test is not yet approved or cleared by the [place] FDA and has been authorized for detection and/or diagnosis of SARS-CoV-2 by FDA under an Emergency Use Authorization (EUA). This EUA will remain in effect (meaning this test can be used) for the duration of the COVID-19 declaration under Section 564(b)(1) of the Act, 21 U.S.C. section 360bbb-3(b)(1), unless the authorization is terminated or revoked.  Performed at [facility], [address], [place], [place] [address]   Urine Culture     Status: Abnormal   Collection Time: 06/27/23  8:29 AM   Specimen: Urine, Clean Catch  Result Value Ref Range Status   Specimen Description URINE, CLEAN CATCH  Final   Special Requests   Final    NONE Performed at [facility], [address] [place], [place] [address]    Culture MULTIPLE SPECIES PRESENT, SUGGEST RECOLLECTION (A)  Final   Report Status 06/28/2023 FINAL  Final  Culture, blood (routine x 2) Call MD if unable to obtain prior to antibiotics being given     Status: None (Preliminary result)   Collection Time: 06/27/23  9:33 AM   Specimen: BLOOD RIGHT HAND  Result Value Ref Range Status   Specimen Description BLOOD RIGHT HAND  Final   Special Requests AEROBIC BOTTLE ONLY  Blood Culture adequate volume  Final   Culture   Final    NO GROWTH < 24 HOURS Performed at [facility], [address] [place], [place] [address]    Report Status PENDING  Incomplete  Culture, blood (routine x 2) Call MD if unable to obtain prior to antibiotics being given     Status: None (Preliminary result)   Collection Time: 06/27/23  9:33 AM   Specimen: BLOOD RIGHT HAND  Result Value Ref Range Status   Specimen Description BLOOD RIGHT HAND  Final   Special Requests   Final    BOTTLES DRAWN AEROBIC AND ANAEROBIC Blood Culture adequate volume   Culture   Final    NO GROWTH < 24 HOURS Performed at [facility], [address], [place], [place] [address]    Report Status PENDING  Incomplete     Discharge  Instructions:   Discharge Instructions     Call MD for:  difficulty breathing, headache or visual disturbances   Complete by: As directed    Call MD for:  temperature >100.4   Complete by: As directed    Diet general   Complete by: As directed    Discharge instructions   Complete by: As directed    Follow-up with your primary care provider in 1 week.  Complete the course of antibiotic.  Check blood work in the next visit.  Do not take hydrochlorothiazide until you see your primary care provider.   Seek medical attention for worsening symptoms.   Increase activity slowly   Complete by: As directed       Allergies as of 06/28/2023   No Known Allergies      Medication List     STOP taking these medications    hydrochlorothiazide 12.5 MG capsule Commonly known as: MICROZIDE       TAKE these medications    albuterol 108 (90 Base) MCG/ACT inhaler Commonly known as: VENTOLIN HFA Inhale 2 puffs into the lungs every 6 (six) hours as needed for wheezing or shortness of breath.   ALPRAZolam 0.5 MG tablet Commonly known as: XANAX Take 0.5 mg by mouth 2 (two) times daily as needed for anxiety.   amLODipine 10 MG tablet Commonly known as:  NORVASC Take 10 mg by mouth at bedtime.   aspirin EC 81 MG tablet Take 81 mg by mouth daily. Swallow whole.   cefdinir 300 MG capsule Commonly known as: OMNICEF Take 1 capsule (300 mg total) by mouth 2 (two) times daily for 3 days. Start taking on: June 29, 2023   Cholecalciferol 25 MCG (1000 UT) tablet Take 1,000 Units by mouth daily.   Colesevelam HCl 3.75 g Pack Take 3.75 g by mouth daily after breakfast.   Dulera 200-5 MCG/ACT Aero Generic drug: mometasone-formoterol Inhale 2 puffs into the lungs 2 (two) times daily.   escitalopram 5 MG tablet Commonly known as: LEXAPRO Take 1 tablet (5 mg total) by mouth daily.   Folivane-Plus Caps Take 1 capsule by mouth daily.   loperamide 2 MG capsule Commonly known as: IMODIUM Take 4 mg by mouth as needed for diarrhea or loose stools.   memantine 5 MG tablet Commonly known as: NAMENDA Take 5-10 mg by mouth See admin instructions. Take 5 mg by mouth in the morning and 10 mg at bedtime.   metoprolol succinate 100 MG 24 hr tablet Commonly known as: TOPROL-XL Take 150 mg by mouth 2 (two) times daily.   OLANZapine 2.5 MG tablet Commonly known as: ZyPREXA Take 1 tablet (2.5 mg total) by mouth 2 (two) times daily as needed (agitation).   sodium chloride 0.65 % Soln nasal spray Commonly known as: OCEAN Place 1 spray into both nostrils as needed for congestion.   traZODone 50 MG tablet Commonly known as: DESYREL TAKE 1 TABLET AT BEDTIME AS NEEDED FOR SLEEP What changed:  how much to take how to take this when to take this reasons to take this additional instructions   VITAMIN B12 PO Take 1 tablet by mouth daily.        Follow-up Information     [facility] Follow up.   Specialty: Home Health Services Why: [facility] will continue to provide home health services including RN, PT, OT and aide.  They will call you in the next 24-48 hours to resume services. Contact information: [address]  [place] [address] [phone]         [name], MD. Schedule an appointment as soon as possible for a visit.   Specialty: Internal Medicine Why: Call the office and schedule a hospital follow up in the next 7-10 days. Contact information: [address] [place] [address] [phone]                  Time coordinating discharge: 39 minutes  Signed:     [facility] 06/28/2023, 2:28 PM

## 2023-06-28 NOTE — Progress Notes (Signed)
   06/28/23 1622  Spiritual Encounters  Type of Visit Initial  Care provided to: Pt and family  Conversation partners present during encounter Nurse  Referral source Family  Reason for visit Advance directives  OnCall Visit No   Received page that patient is being discharged and wants to discuss advance directive before leaving. Patient's spouse Corrie Dandy is also present.  Discussed and completed forms two advance directives, one for Jerry [name] and one for Jerry [name] assigning their daughters as MCPOA. Advance directives were not processed as the pair was having a family meeting this coming Saturday with their daughters, where they will discuss their wishes. Advised to return forms after notarized during follow-up visit or mail in.

## 2023-06-28 NOTE — Progress Notes (Addendum)
Initial Nutrition Assessment  DOCUMENTATION CODES:   Non-severe (moderate) malnutrition in context of acute illness/injury  INTERVENTION:  - Liberalize to a regular diet.   - Continue Ensure Enlive po BID, each supplement provides 350 kcal and 20 grams of protein.  - Add Magic cup BID with meals, each supplement provides 290 kcal and 9 grams of protein  - Add MVI q day.   NUTRITION DIAGNOSIS:   Moderate Malnutrition related to acute illness as evidenced by energy intake < 75% for > or equal to 1 month, mild fat depletion, mild muscle depletion.  GOAL:   Patient will meet greater than or equal to 90% of their needs  MONITOR:   PO intake, Supplement acceptance  REASON FOR ASSESSMENT:   Malnutrition Screening Tool    ASSESSMENT:   82 y.o. male admits related to SOB, fatigue, and hypoxia. PMH includes: asthma, bladder cancer, HTN, kidney stone, vascular dementia. Pt is currently receiving medical management related to CAP.  Meds reviewed: Vit D3, Vit B12. Labs reviewed: BUN/Creatinine elevated.   The pt reports that he did not eat much of his breakfast this am due to being very tired. Pt reports that his appetite has been fair and that this has been going on for awhile. Pt unable to express a timeline of poor appetite. Pt also reports that he has had some weight loss but was unable to quantify. No significant wt loss per record.   The pt currently has Ensure Enlive BID ordered. He had one on bedside table that he was drinking and reports that "they are okay". RD discussed the importance of adequate calorie and protein intake to preserve lean muscle mass and prevent further weight loss. Pt verbalized understanding. Pt is also willing to try Magic Cup BID. RD will add supplements and continue to monitor PO intakes.   NUTRITION - FOCUSED PHYSICAL EXAM:  Flowsheet Row Most Recent Value  Orbital Region Mild depletion  Upper Arm Region Moderate depletion  Thoracic and Lumbar  Region Unable to assess  Buccal Region Mild depletion  Temple Region Mild depletion  Clavicle Bone Region Mild depletion  Clavicle and Acromion Bone Region Mild depletion  Scapular Bone Region Unable to assess  Dorsal Hand Mild depletion  Patellar Region Mild depletion  Anterior Thigh Region Mild depletion  Posterior Calf Region Mild depletion  Edema (RD Assessment) None  Hair Reviewed  Eyes Reviewed  Mouth Reviewed  Skin Reviewed  Nails Reviewed       Diet Order:   Diet Order             Diet Heart Room service appropriate? Yes; Fluid consistency: Thin  Diet effective now                   EDUCATION NEEDS:   Not appropriate for education at this time  Skin:  Skin Assessment: Reviewed RN Assessment  Last BM:  8/14 - type 6  Height:   Ht Readings from Last 1 Encounters:  06/26/23 5\' 11"  (1.803 m)    Weight:   Wt Readings from Last 1 Encounters:  06/26/23 80 kg    Ideal Body Weight:     BMI:  Body mass index is 24.6 kg/m.  Estimated Nutritional Needs:   Kcal:  2000-2400 kcals  Protein:  100-120 gm  Fluid:  >/= 2 L  Bethann Humble, RD, LDN, CNSC.

## 2023-09-20 ENCOUNTER — Other Ambulatory Visit: Payer: Self-pay | Admitting: Gastroenterology

## 2023-10-03 ENCOUNTER — Encounter (HOSPITAL_COMMUNITY): Payer: Self-pay | Admitting: Gastroenterology

## 2023-10-08 NOTE — Anesthesia Preprocedure Evaluation (Signed)
Anesthesia Evaluation  Patient identified by MRN, date of birth, ID band Patient awake    Reviewed: Allergy & Precautions, NPO status , Patient's Chart, lab work & pertinent test results, reviewed documented beta blocker date and time   History of Anesthesia Complications Negative for: history of anesthetic complications  Airway Mallampati: II  TM Distance: >3 FB Neck ROM: Full    Dental  (+) Missing,    Pulmonary asthma , sleep apnea    Pulmonary exam normal        Cardiovascular hypertension, Pt. on medications and Pt. on home beta blockers Normal cardiovascular exam     Neuro/Psych       Dementia    GI/Hepatic Neg liver ROS,,,?GI bleed   Endo/Other  negative endocrine ROS    Renal/GU Renal InsufficiencyRenal disease     Musculoskeletal  (+) Arthritis ,    Abdominal   Peds  Hematology  (+) Blood dyscrasia (Hgb 7.8 in 06/2023), anemia   Anesthesia Other Findings Day of surgery medications reviewed with patient.  Reproductive/Obstetrics                              Anesthesia Physical Anesthesia Plan  ASA: 3  Anesthesia Plan: MAC   Post-op Pain Management: Minimal or no pain anticipated   Induction:   PONV Risk Score and Plan: Treatment may vary due to age or medical condition and Propofol infusion  Airway Management Planned: Natural Airway and Nasal Cannula  Additional Equipment: None  Intra-op Plan:   Post-operative Plan:   Informed Consent: I have reviewed the patients History and Physical, chart, labs and discussed the procedure including the risks, benefits and alternatives for the proposed anesthesia with the patient or authorized representative who has indicated his/her understanding and acceptance.       Plan Discussed with: CRNA  Anesthesia Plan Comments:          Anesthesia Quick Evaluation

## 2023-10-09 ENCOUNTER — Ambulatory Visit (HOSPITAL_BASED_OUTPATIENT_CLINIC_OR_DEPARTMENT_OTHER): Payer: Self-pay | Admitting: Registered Nurse

## 2023-10-09 ENCOUNTER — Other Ambulatory Visit: Payer: Self-pay

## 2023-10-09 ENCOUNTER — Ambulatory Visit (HOSPITAL_COMMUNITY)
Admission: RE | Admit: 2023-10-09 | Discharge: 2023-10-09 | Disposition: A | Discharge status: Home / Self Care | Payer: Medicare Other | Attending: Gastroenterology | Admitting: Gastroenterology

## 2023-10-09 ENCOUNTER — Ambulatory Visit (HOSPITAL_COMMUNITY): Payer: Medicare Other | Admitting: Registered Nurse

## 2023-10-09 ENCOUNTER — Encounter (HOSPITAL_COMMUNITY)
Admission: RE | Disposition: A | Discharge status: Home / Self Care | Payer: Self-pay | Source: Home / Self Care | Attending: Gastroenterology

## 2023-10-09 DIAGNOSIS — Z87891 Personal history of nicotine dependence: Secondary | ICD-10-CM | POA: Diagnosis not present

## 2023-10-09 DIAGNOSIS — K2950 Unspecified chronic gastritis without bleeding: Secondary | ICD-10-CM | POA: Insufficient documentation

## 2023-10-09 DIAGNOSIS — K648 Other hemorrhoids: Secondary | ICD-10-CM | POA: Diagnosis not present

## 2023-10-09 DIAGNOSIS — K5520 Angiodysplasia of colon without hemorrhage: Secondary | ICD-10-CM | POA: Diagnosis not present

## 2023-10-09 DIAGNOSIS — K921 Melena: Secondary | ICD-10-CM | POA: Diagnosis present

## 2023-10-09 DIAGNOSIS — K449 Diaphragmatic hernia without obstruction or gangrene: Secondary | ICD-10-CM | POA: Insufficient documentation

## 2023-10-09 DIAGNOSIS — K222 Esophageal obstruction: Secondary | ICD-10-CM | POA: Diagnosis not present

## 2023-10-09 DIAGNOSIS — D509 Iron deficiency anemia, unspecified: Secondary | ICD-10-CM | POA: Insufficient documentation

## 2023-10-09 DIAGNOSIS — K31811 Angiodysplasia of stomach and duodenum with bleeding: Secondary | ICD-10-CM

## 2023-10-09 HISTORY — PX: HOT HEMOSTASIS: SHX5433

## 2023-10-09 HISTORY — PX: COLONOSCOPY WITH PROPOFOL: SHX5780

## 2023-10-09 HISTORY — PX: ESOPHAGOGASTRODUODENOSCOPY (EGD) WITH PROPOFOL: SHX5813

## 2023-10-09 HISTORY — PX: BIOPSY: SHX5522

## 2023-10-09 SURGERY — COLONOSCOPY WITH PROPOFOL
Anesthesia: Monitor Anesthesia Care

## 2023-10-09 MED ORDER — PROPOFOL 1000 MG/100ML IV EMUL
INTRAVENOUS | Status: AC
  Filled 2023-10-09: qty 100

## 2023-10-09 MED ORDER — PROPOFOL 10 MG/ML IV BOLUS
INTRAVENOUS | Status: DC | PRN
  Administered 2023-10-09 (×2): 10 mg via INTRAVENOUS

## 2023-10-09 MED ORDER — PROPOFOL 500 MG/50ML IV EMUL
INTRAVENOUS | Status: DC | PRN
  Administered 2023-10-09: 130 ug/kg/min via INTRAVENOUS

## 2023-10-09 MED ORDER — SODIUM CHLORIDE 0.9 % IV SOLN: INTRAVENOUS | Status: DC

## 2023-10-09 SURGICAL SUPPLY — 23 items
BLOCK BITE 60FR ADLT L/F BLUE (MISCELLANEOUS) ×3 IMPLANT
ELECT REM PT RETURN 9FT ADLT (ELECTROSURGICAL)
ELECTRODE REM PT RTRN 9FT ADLT (ELECTROSURGICAL) IMPLANT
FCP BXJMBJMB 240X2.8X (CUTTING FORCEPS)
FLOOR PAD 36X40 (MISCELLANEOUS) ×3
FORCEP RJ3 GP 1.8X160 W-NEEDLE (CUTTING FORCEPS) IMPLANT
FORCEPS BIOP RAD 4 LRG CAP 4 (CUTTING FORCEPS) IMPLANT
FORCEPS BXJMBJMB 240X2.8X (CUTTING FORCEPS) IMPLANT
INJECTOR/SNARE I SNARE (MISCELLANEOUS) IMPLANT
LUBRICANT JELLY 4.5OZ STERILE (MISCELLANEOUS) IMPLANT
MANIFOLD NEPTUNE II (INSTRUMENTS) IMPLANT
NDL SCLEROTHERAPY 25GX240 (NEEDLE) IMPLANT
NEEDLE SCLEROTHERAPY 25GX240 (NEEDLE)
PAD FLOOR 36X40 (MISCELLANEOUS) ×3 IMPLANT
PROBE APC STR FIRE (PROBE) IMPLANT
PROBE INJECTION GOLD 7FR (MISCELLANEOUS) IMPLANT
SNARE ROTATE MED OVAL 20MM (MISCELLANEOUS) IMPLANT
SNARE SHORT THROW 13M SML OVAL (MISCELLANEOUS) IMPLANT
SYR 50ML LL SCALE MARK (SYRINGE) IMPLANT
TRAP SPECIMEN MUCOUS 40CC (MISCELLANEOUS) IMPLANT
TUBING ENDO SMARTCAP PENTAX (MISCELLANEOUS) ×6 IMPLANT
TUBING IRRIGATION ENDOGATOR (MISCELLANEOUS) ×3 IMPLANT
WATER STERILE IRR 1000ML POUR (IV SOLUTION) IMPLANT

## 2023-10-09 NOTE — Op Note (Signed)
[facility] Patient Name: Jerry [name] Procedure Date: 10/09/2023 MRN: [id number] Attending MD: Kathi Der , MD, [id number] Date of Birth: Apr 07, 1941 CSN: [id number] Age: 82 Admit Type: Outpatient Procedure:                Colonoscopy Indications:              Melena, Iron deficiency anemia Providers:                Kathi Der, MD, Marge Duncans, RN, Geoffery Lyons, Technician Referring MD:              Medicines:                Sedation Administered by an Anesthesia Professional Complications:            No immediate complications. Estimated Blood Loss:     Estimated blood loss was minimal. Procedure:                Pre-Anesthesia Assessment:                           - Prior to the procedure, a History and Physical                            was performed, and patient medications and                            allergies were reviewed. The patient's tolerance of                            previous anesthesia was also reviewed. The risks                            and benefits of the procedure and the sedation                            options and risks were discussed with the patient.                            All questions were answered, and informed consent                            was obtained. Prior Anticoagulants: The patient has                            taken no anticoagulant or antiplatelet agents. ASA                            Grade Assessment: III - A patient with severe                            systemic disease. After reviewing the risks and  benefits, the patient was deemed in satisfactory                            condition to undergo the procedure.                           After obtaining informed consent, the colonoscope                            was passed under direct vision. Throughout the                            procedure, the patient's blood pressure, pulse, and                             oxygen saturations were monitored continuously. The                            [id number]-HQ190L ([id number] Olympus colonoscope was                            introduced through the anus and advanced to the the                            cecum, identified by appendiceal orifice and                            ileocecal valve. The colonoscopy was performed with                            moderate difficulty due to inadequate bowel prep.                            The patient tolerated the procedure well. The                            quality of the bowel preparation was inadequate.                            The ileocecal valve, appendiceal orifice, and                            rectum were photographed. Scope In: 9:52:55 AM Scope Out: 10:05:42 AM Scope Withdrawal Time: 0 hours 9 minutes 47 seconds  Total Procedure Duration: 0 hours 12 minutes 47 seconds  Findings:      The perianal and digital rectal examinations were normal.      Multiple large patchy angioectasias with bleeding on contact were found       in the ascending colon and in the cecum. Fulguration to ablate the       lesion to prevent bleeding by argon plasma was successful.      A large amount of liquid semi-liquid stool was found in the entire       colon, interfering with visualization. Lavage of the area was performed,  resulting in incomplete clearance with continued poor visualization.      Internal hemorrhoids were found during retroflexion. The hemorrhoids       were large. Impression:               - Preparation of the colon was inadequate.                           - Multiple colonic angioectasias. Treated with                            argon plasma coagulation (APC).                           - Stool in the entire examined colon.                           - Internal hemorrhoids.                           - No specimens collected. Moderate Sedation:      Moderate (conscious) sedation was  personally administered by an       anesthesia professional. The following parameters were monitored: oxygen       saturation, heart rate, blood pressure, and response to care. Recommendation:           - Patient has a contact number available for                            emergencies. The signs and symptoms of potential                            delayed complications were discussed with the                            patient. Return to normal activities tomorrow.                            Written discharge instructions were provided to the                            patient.                           - Resume previous diet.                           - Continue present medications.                           - No repeat colonoscopy due to age.                           - Return to GI office in 3 months. Procedure Code(s):        --- Professional ---                           [phone], Colonoscopy, flexible;  with control of                            bleeding, any method Diagnosis Code(s):        --- Professional ---                           K64.8, Other hemorrhoids                           K55.20, Angiodysplasia of colon without hemorrhage                           K92.1, Melena (includes Hematochezia)                           D50.9, Iron deficiency anemia, unspecified CPT copyright 2022 American [employer] Association. All rights reserved. The codes documented in this report are preliminary and upon coder review may  be revised to meet current compliance requirements. Kathi Der, MD Kathi Der, MD 10/09/2023 10:22:59 AM Number of Addenda: 0

## 2023-10-09 NOTE — Transfer of Care (Signed)
Immediate Anesthesia Transfer of Care Note  Patient: Jerry [name].  Procedure(s) Performed: COLONOSCOPY WITH PROPOFOL (Bilateral) ESOPHAGOGASTRODUODENOSCOPY (EGD) WITH PROPOFOL (Bilateral) BIOPSY HOT HEMOSTASIS (ARGON PLASMA COAGULATION/BICAP)  Patient Location: PACU and Endoscopy Unit  Anesthesia Type:MAC  Level of Consciousness: awake, alert , and patient cooperative  Airway & Oxygen Therapy: Patient Spontanous Breathing and Patient connected to face mask oxygen  Post-op Assessment: Report given to RN, Post -op Vital signs reviewed and stable, and Patient moving all extremities  Post vital signs: Reviewed and stable  Last Vitals:  Vitals Value Taken Time  BP    Temp    Pulse 72 10/09/23 1013  Resp 22 10/09/23 1013  SpO2 100 % 10/09/23 1013  Vitals shown include unfiled device data.  Last Pain:  Vitals:   10/09/23 0914  TempSrc: Temporal  PainSc: 0-No pain         Complications: No notable events documented.

## 2023-10-09 NOTE — Discharge Instructions (Signed)
YOU HAD AN ENDOSCOPIC PROCEDURE TODAY: Refer to the procedure report and other information in the discharge instructions given to you for any specific questions about what was found during the examination. If this information does not answer your questions, please call [facility] office at [phone] to clarify.   YOU SHOULD EXPECT: Some feelings of bloating in the abdomen. Passage of more gas than usual. Walking can help get rid of the air that was put into your GI tract during the procedure and reduce the bloating. If you had a lower endoscopy (such as a colonoscopy or flexible sigmoidoscopy) you may notice spotting of blood in your stool or on the toilet paper. Some abdominal soreness may be present for a day or two, also.  DIET: Your first meal following the procedure should be a light meal and then it is ok to progress to your normal diet. A half-sandwich or bowl of soup is an example of a good first meal. Heavy or fried foods are harder to digest and may make you feel nauseous or bloated. Drink plenty of fluids but you should avoid alcoholic beverages for 24 hours. If you had a esophageal dilation, please see attached instructions for diet.    ACTIVITY: Your care partner should take you home directly after the procedure. You should plan to take it easy, moving slowly for the rest of the day. You can resume normal activity the day after the procedure however YOU SHOULD NOT DRIVE, use power tools, machinery or perform tasks that involve climbing or major physical exertion for 24 hours (because of the sedation medicines used during the test).   SYMPTOMS TO REPORT IMMEDIATELY: A gastroenterologist can be reached at any hour. Please call [phone]  for any of the following symptoms:  Following lower endoscopy (colonoscopy, flexible sigmoidoscopy) Excessive amounts of blood in the stool  Significant tenderness, worsening of abdominal pains  Swelling of the abdomen that is new, acute  Fever of 100  or higher  Following upper endoscopy (EGD, EUS, ERCP, esophageal dilation) Vomiting of blood or coffee ground material  New, significant abdominal pain  New, significant chest pain or pain under the shoulder blades  Painful or persistently difficult swallowing  New shortness of breath  Black, tarry-looking or red, bloody stools  FOLLOW UP:  If any biopsies were taken you will be contacted by phone or by letter within the next 1-3 weeks. Call [phone]  if you have not heard about the biopsies in 3 weeks.  Please also call with any specific questions about appointments or follow up tests. YOU HAD AN ENDOSCOPIC PROCEDURE TODAY: Refer to the procedure report and other information in the discharge instructions given to you for any specific questions about what was found during the examination. If this information does not answer your questions, please call [facility] office at [phone] to clarify.

## 2023-10-09 NOTE — Anesthesia Postprocedure Evaluation (Signed)
Anesthesia Post Note  Patient: Jerry [name].  Procedure(s) Performed: COLONOSCOPY WITH PROPOFOL (Bilateral) ESOPHAGOGASTRODUODENOSCOPY (EGD) WITH PROPOFOL (Bilateral) BIOPSY HOT HEMOSTASIS (ARGON PLASMA COAGULATION/BICAP)     Patient location during evaluation: PACU Anesthesia Type: MAC Level of consciousness: awake and alert Pain management: pain level controlled Vital Signs Assessment: post-procedure vital signs reviewed and stable Respiratory status: spontaneous breathing, nonlabored ventilation and respiratory function stable Cardiovascular status: blood pressure returned to baseline Postop Assessment: no apparent nausea or vomiting Anesthetic complications: no   No notable events documented.  Last Vitals:  Vitals:   10/09/23 1030 10/09/23 1034  BP: (!) 168/85 (!) 159/83  Pulse: 63 (!) 59  Resp: 20 19  Temp:    SpO2: 95% 94%    Last Pain:  Vitals:   10/09/23 1034  TempSrc:   PainSc: 0-No pain                 Shanda Howells

## 2023-10-09 NOTE — Op Note (Signed)
[facility] Patient Name: Jerry [name] Procedure Date: 10/09/2023 MRN: [id number] Attending MD: Kathi Der , MD, [id number] Date of Birth: 10/08/1941 CSN: [id number] Age: 82 Admit Type: Outpatient Procedure:                Upper GI endoscopy Indications:              Iron deficiency anemia, Melena Providers:                Kathi Der, MD, Marge Duncans, RN, Geoffery Lyons, Technician Referring MD:              Medicines:                Sedation Administered by an Anesthesia Professional Complications:            No immediate complications. Estimated Blood Loss:     Estimated blood loss was minimal. Procedure:                Pre-Anesthesia Assessment:                           - Prior to the procedure, a History and Physical                            was performed, and patient medications and                            allergies were reviewed. The patient's tolerance of                            previous anesthesia was also reviewed. The risks                            and benefits of the procedure and the sedation                            options and risks were discussed with the patient.                            All questions were answered, and informed consent                            was obtained. Prior Anticoagulants: The patient has                            taken no anticoagulant or antiplatelet agents. ASA                            Grade Assessment: III - A patient with severe                            systemic disease. After reviewing the risks and  benefits, the patient was deemed in satisfactory                            condition to undergo the procedure.                           After obtaining informed consent, the endoscope was                            passed under direct vision. Throughout the                            procedure, the patient's blood pressure, pulse, and                             oxygen saturations were monitored continuously. The                            [id number]-[id number] ([id number]) Olympus endoscope was introduced                            through the mouth, and advanced to the second part                            of duodenum. The upper GI endoscopy was                            accomplished without difficulty. The patient                            tolerated the procedure well. Scope In: Scope Out: Findings:      A non-obstructing Schatzki ring was found at the gastroesophageal       junction.      A small hiatal hernia was present.      Multiple small angioectasias with stigmata of recent bleeding were found       in the gastric fundus and in the gastric body. Fulguration to ablate the       lesion to prevent bleeding by argon plasma was successful.      Diffuse mild inflammation characterized by congestion (edema) and       erythema was found in the entire examined stomach. Biopsies were taken       with a cold forceps for histology.      The cardia and gastric fundus were normal on retroflexion.      The duodenal bulb, first portion of the duodenum and second portion of       the duodenum were normal.      Small nodule on the vocal cord. Impression:               - Non-obstructing Schatzki ring.                           - Small hiatal hernia.                           - Multiple recently bleeding angioectasias in  the                            stomach. Treated with argon plasma coagulation                            (APC).                           - Chronic gastritis. Biopsied.                           - Normal duodenal bulb, first portion of the                            duodenum and second portion of the duodenum. Moderate Sedation:      Moderate (conscious) sedation was personally administered by an       anesthesia professional. The following parameters were monitored: oxygen       saturation, heart rate, blood pressure, and  response to care. Recommendation:           - Perform a colonoscopy today.                           - Refer to an ENT specialist at appointment to be                            scheduled. Procedure Code(s):        --- Professional ---                           43255, 59, Esophagogastroduodenoscopy, flexible,                            transoral; with control of bleeding, any method                           43239, Esophagogastroduodenoscopy, flexible,                            transoral; with biopsy, single or multiple Diagnosis Code(s):        --- Professional ---                           K22.2, Esophageal obstruction                           K44.9, Diaphragmatic hernia without obstruction or                            gangrene                           K31.811, Angiodysplasia of stomach and duodenum                            with bleeding  K29.50, Unspecified chronic gastritis without                            bleeding                           D50.9, Iron deficiency anemia, unspecified                           K92.1, Melena (includes Hematochezia) CPT copyright 2022 American Medical Association. All rights reserved. The codes documented in this report are preliminary and upon coder review may  be revised to meet current compliance requirements. Kathi Der, MD Kathi Der, MD 10/09/2023 10:17:35 AM Number of Addenda: 0

## 2023-10-09 NOTE — Anesthesia Procedure Notes (Signed)
Procedure Name: MAC Date/Time: 10/09/2023 9:35 AM  Performed by: Elisabeth Cara, CRNAPre-anesthesia Checklist: Patient identified, Emergency Drugs available, Suction available, Patient being monitored and Timeout performed Oxygen Delivery Method: Simple face mask Placement Confirmation: positive ETCO2 Dental Injury: Teeth and Oropharynx as per pre-operative assessment

## 2023-10-09 NOTE — H&P (Signed)
Jerry [name]. is a 82 y.o. male has presented for outpatient EGD and colonoscopy for evaluation of anemia and melena.  Please see full H&P for details in the scanned documents.   The various methods of treatment have been discussed with the patient and family. After consideration of risks, benefits and other options for treatment, the patient has consented to  Procedure(s): Upper endoscopy and colonoscopy as a surgical intervention .  The patient's history has been reviewed, patient examined, no change in status, stable for surgery.  I have reviewed the patient's chart and labs.  Questions were answered to the patient's satisfaction.    Risks (bleeding, infection, bowel perforation that could require surgery, sedation-related changes in cardiopulmonary systems), benefits (identification and possible treatment of source of symptoms, exclusion of certain causes of symptoms), and alternatives (watchful waiting, radiographic imaging studies, empiric medical treatment)  were explained to patient/family in detail and patient wishes to proceed.   Kathi Der MD, FACP 10/09/2023, 9:18 AM  Contact #  [phone]

## 2023-10-10 LAB — SURGICAL PATHOLOGY

## 2023-10-12 ENCOUNTER — Encounter (HOSPITAL_COMMUNITY): Payer: Self-pay | Admitting: Gastroenterology

## 2023-11-15 ENCOUNTER — Other Ambulatory Visit: Payer: Self-pay | Admitting: Internal Medicine

## 2023-11-15 DIAGNOSIS — N1832 Chronic kidney disease, stage 3b: Secondary | ICD-10-CM

## 2023-11-16 ENCOUNTER — Ambulatory Visit
Admission: RE | Admit: 2023-11-16 | Discharge: 2023-11-16 | Disposition: A | Discharge status: Home / Self Care | Payer: Medicare Other | Source: Ambulatory Visit | Attending: Internal Medicine | Admitting: Internal Medicine

## 2023-11-16 DIAGNOSIS — N1832 Chronic kidney disease, stage 3b: Secondary | ICD-10-CM

## 2023-12-13 ENCOUNTER — Ambulatory Visit: Payer: Medicare Other | Attending: Cardiology | Admitting: Cardiology

## 2023-12-13 VITALS — BP 120/64 | HR 70 | Resp 16 | Ht 72.0 in | Wt 175.2 lb

## 2023-12-13 DIAGNOSIS — I10 Essential (primary) hypertension: Secondary | ICD-10-CM | POA: Diagnosis not present

## 2023-12-13 DIAGNOSIS — R739 Hyperglycemia, unspecified: Secondary | ICD-10-CM | POA: Insufficient documentation

## 2023-12-13 DIAGNOSIS — R600 Localized edema: Secondary | ICD-10-CM | POA: Diagnosis present

## 2023-12-13 DIAGNOSIS — D649 Anemia, unspecified: Secondary | ICD-10-CM | POA: Insufficient documentation

## 2023-12-13 DIAGNOSIS — Z131 Encounter for screening for diabetes mellitus: Secondary | ICD-10-CM | POA: Diagnosis present

## 2023-12-13 DIAGNOSIS — N289 Disorder of kidney and ureter, unspecified: Secondary | ICD-10-CM | POA: Diagnosis not present

## 2023-12-13 DIAGNOSIS — E782 Mixed hyperlipidemia: Secondary | ICD-10-CM | POA: Diagnosis not present

## 2023-12-13 MED ORDER — METOPROLOL SUCCINATE ER 100 MG PO TB24: 100.0000 mg | ORAL_TABLET | Freq: Every day | ORAL | 3 refills | Status: DC

## 2023-12-13 MED ORDER — FUROSEMIDE 40 MG PO TABS: 40.0000 mg | ORAL_TABLET | Freq: Every day | ORAL | 2 refills | Status: DC

## 2023-12-13 NOTE — Progress Notes (Signed)
Cardiology Office Note:  .   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    Jerry [name]. is a 83 y.o. 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  Patient is here with his wife today. While, his blood pressure is well-controlled, he stays tired and fatigued all the time.  He also reports exertional dyspnea with minimal activity such as walking from parking lot to this building, and has also noticed bilateral leg edema.  He underwent workup for anemia with Jerry [name], was found to have Schatzki's ring on EGD, colonoscopy prep was inadequate.  He reportedly had subsequent blood work with Dr. Mathews [name] office, results not available to me.  Separately, wife mentions to me that patient has increased definitely due to sweet foods.  I do not see a recent A1c checked   Vitals:   12/13/23 1435  BP: 120/64  Pulse: 70  Resp: 16  SpO2: 97%     ROS:  Review of Systems  Cardiovascular:  Positive for dyspnea on exertion and leg swelling. Negative for chest pain, palpitations and syncope.     Studies Reviewed: [name]        EKG 12/13/2023: Sinus rhythm with Premature supraventricular complexes Right axis deviation Anteroseptal infarct , age undetermined When compared with ECG of 26-Jun-2023 15:03,  PAC is new    Independently interpreted 06/2023: Hb 7.8 Cr 1.79, eGFR 38     Physical Exam:   Physical Exam Vitals and nursing note reviewed.  Constitutional:      General: He is not in acute distress. Neck:     Vascular: No JVD.   Cardiovascular:     Rate and Rhythm: Normal rate and regular rhythm.     Heart sounds: Normal heart sounds. No murmur heard. Pulmonary:     Effort: Pulmonary effort is normal.     Breath sounds: Normal breath sounds. No wheezing or rales.  Musculoskeletal:     Right lower leg: Edema (2+) present.     Left lower leg: Edema (2+) present.      VISIT DIAGNOSES:   ICD-10-CM   1. Primary hypertension  I10 EKG 12-Lead    2. Anemia, unspecified type  D64.9 CBC    CBC    3. Mixed hyperlipidemia  E78.2     4. Renal insufficiency  N28.9 Comp Met (CMET)    Comp Met (CMET)    5. Bilateral leg edema  R60.0 ECHOCARDIOGRAM COMPLETE    Pro b natriuretic peptide (BNP)    Pro b natriuretic peptide (BNP)    6. Hyperglycemia  R73.9 HgB A1c    HgB A1c    7. Screening for diabetes mellitus  Z13.1 HgB A1c    HgB A1c       ASSESSMENT AND PLAN: .    Jerry [name]. is a 83 y.o. 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  He has anemia,  possibly moderate malnutrition which could cause third spacing.  At the same time, also need to exclude heart failure.  I do not think his fatigue could be entirely explained even if he were to have heart failure.  Will check echocardiogram, proBNP.  Will also will repeat CBC, BMP if not recently checked by Dr. Mathews [name] office. In the meantime, increase Lasix from 10 mg daily to 4 mg daily. If his albumin is low, could consider switching Lasix to Bumex.       No orders of the defined types were placed in this encounter.    F/u in 8-12 weeks  Signed, Elder Negus, MD

## 2023-12-13 NOTE — Patient Instructions (Signed)
Medication Instructions:   INCREASE YOUR LASIX TO 40 MG BY MOUTH DAILY  DECREASE YOUR METOPROLOL SUCCINATE (TOPROL XL) TO 100 MG BY MOUTH DAILY  *If you need a refill on your cardiac medications before your next appointment, please call your pharmacy*   Lab Work:  SAME DAY AS YOU COME IN FOR YOUR ECHO --DOWNSTAIRS FIRST FLOOR AT [facility]--CMET, A1C, CBC, AND PRO-BNP  If you have labs (blood work) drawn today and your tests are completely normal, you will receive your results only by: MyChart Message (if you have MyChart) OR A paper copy in the mail If you have any lab test that is abnormal or we need to change your treatment, we will call you to review the results.   Testing/Procedures:  Your physician has requested that you have an echocardiogram. Echocardiography is a painless test that uses sound waves to create images of your heart. It provides your doctor with information about the size and shape of your heart and how well your heart's chambers and valves are working. This procedure takes approximately one hour. There are no restrictions for this procedure. Please do NOT wear cologne, perfume, aftershave, or lotions (deodorant is allowed). Please arrive 15 minutes prior to your appointment time.  Please note: We ask at that you not bring children with you during ultrasound (echo/ vascular) testing. Due to room size and safety concerns, children are not allowed in the ultrasound rooms during exams. Our front office staff cannot provide observation of children in our lobby area while testing is being conducted. An adult accompanying a patient to their appointment will only be allowed in the ultrasound room at the discretion of the ultrasound technician under special circumstances. We apologize for any inconvenience.    Follow-Up:  8-12 WEEKS WITH DR. PATWARDHAN OR AN EXTENDER

## 2023-12-17 ENCOUNTER — Ambulatory Visit (HOSPITAL_COMMUNITY)
Admission: RE | Admit: 2023-12-17 | Discharge: 2023-12-17 | Disposition: A | Discharge status: Home / Self Care | Payer: Medicare Other | Source: Ambulatory Visit | Attending: Plastic Surgery | Admitting: Plastic Surgery

## 2023-12-17 ENCOUNTER — Other Ambulatory Visit: Payer: Self-pay | Admitting: Adult Health

## 2023-12-17 DIAGNOSIS — N99528 Other complication of other external stoma of urinary tract: Secondary | ICD-10-CM | POA: Diagnosis not present

## 2023-12-17 DIAGNOSIS — N329 Bladder disorder, unspecified: Secondary | ICD-10-CM

## 2023-12-17 DIAGNOSIS — R634 Abnormal weight loss: Secondary | ICD-10-CM | POA: Diagnosis present

## 2023-12-17 DIAGNOSIS — L24B3 Irritant contact dermatitis related to fecal or urinary stoma or fistula: Secondary | ICD-10-CM | POA: Diagnosis not present

## 2023-12-17 NOTE — Progress Notes (Incomplete)
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   Malnutrition of moderate degree 06/28/2023   Mixed hyperlipidemia 07/21/2021   OSA (obstructive sleep apnea) 07/12/2021   NPSG- 03/16/21- AHI 10.3/ hr, desaturation to 87%, body weight 190 lbs     Renal insufficiency 04/17/2015   Syncope 04/17/2015   Tremor 07/21/2021   Vascular dementia (HCC)    Vitamin D deficiency 07/21/2021   Family History  Problem Relation Age of Onset   Diabetes Mother    Diabetes Father    No Known Allergies Current Outpatient Medications  Medication Sig Dispense Refill Last Dose/Taking   albuterol (VENTOLIN HFA) 108 (90 Base) MCG/ACT inhaler Inhale 2 puffs into the lungs every 6 (six) hours as needed for wheezing or shortness of breath. 8 g 6    ALPRAZolam (XANAX) 0.5 MG tablet Take 0.5 mg by mouth 2 (two) times daily as needed for anxiety.      amLODipine (NORVASC) 10 MG tablet Take 10 mg by mouth at bedtime.      aspirin EC 81 MG tablet Take 81 mg by mouth daily. Swallow whole.      Cholecalciferol 1000 UNITS tablet Take 1,000 Units by mouth daily.      Cyanocobalamin (VITAMIN B12 PO) Take 1 tablet by mouth daily.      DULERA 200-5 MCG/ACT AERO Inhale 2 puffs into the lungs 2 (two) times daily.      escitalopram (LEXAPRO) 5 MG tablet Take 1 tablet (5 mg total) by mouth daily. 30 tablet 0    FeFum-FePoly-FA-B Cmp-C-Biot (FOLIVANE-PLUS) CAPS Take 1 capsule by mouth daily.       furosemide (LASIX) 40 MG tablet Take 1 tablet (40 mg total) by mouth daily. 90 tablet 2    ipratropium-albuterol (DUONEB) 0.5-2.5 (3) MG/3ML SOLN Inhale 3 mLs into the lungs every 2 (two) hours as needed.      loperamide (IMODIUM) 2 MG capsule Take 4 mg by mouth as needed for diarrhea or loose stools.      metoprolol succinate (TOPROL-XL) 100 MG 24 hr tablet Take 1 tablet (100 mg total) by mouth daily. Take with or immediately following a meal. 90 tablet 3    OLANZapine (ZYPREXA) 2.5 MG tablet Take 1 tablet (2.5 mg total) by mouth 2 (two) times daily as needed (agitation). 60 tablet 0    No current facility-administered medications for this encounter.   ROS  Review of Systems Vital signs:  There were no vitals taken for this visit. Exam:  Physical Exam  Stoma type/location:  *** Stomal assessment/size:  *** Peristomal assessment:  *** Treatment options for stomal/peristomal skin: *** Output: *** Ostomy pouching: 1pc./2pc.  Education provided:  ***    Impression/dx  *** Discussion  *** Plan  ***    Visit time: *** minutes.   Mike Gip FNP-BC

## 2023-12-17 NOTE — Discharge Instructions (Signed)
Switching to 1 piece convex (May use 2 piece if you like it better)  Stoma powder and skin prep around the stoma  Allow air air dry, about 5 seconds Press and mold barrier ring, place around stoma Add ostomy belt Will update [place] for supplies Call clinic and let me know which supplies to order Mike Gip [phone] clinic

## 2023-12-21 ENCOUNTER — Telehealth: Payer: Self-pay | Admitting: Cardiology

## 2023-12-21 DIAGNOSIS — L24B3 Irritant contact dermatitis related to fecal or urinary stoma or fistula: Secondary | ICD-10-CM | POA: Insufficient documentation

## 2023-12-21 DIAGNOSIS — N99528 Other complication of other external stoma of urinary tract: Secondary | ICD-10-CM | POA: Insufficient documentation

## 2023-12-21 MED ORDER — FUROSEMIDE 40 MG PO TABS: 40.0000 mg | ORAL_TABLET | Freq: Two times a day (BID) | ORAL | 1 refills | Status: DC

## 2023-12-21 NOTE — Telephone Encounter (Signed)
 Pt c/o medication issue:  1. Name of Medication: Furosemide - was increased  on  last Thursday(12-13-23)  2. How are you currently taking this medication (dosage and times per day)?   3. Are you having a reaction (difficulty breathing--STAT)?   4. What is your medication issue? Right foot  and ankle is swollen much worse

## 2023-12-21 NOTE — Telephone Encounter (Signed)
 ProBNP, echo still pending. Okay to increase lasix  to 40 mg bid. Check proBNP as soon as they can, even if echo is pending.  Thanks MJP

## 2023-12-21 NOTE — Telephone Encounter (Signed)
 Wife reports:  + 5 pd weight gain over last 3 weeks  Left leg/ankle has improved considerably, but looks like it all moved to the right leg/ankle  SOB  Right lower back pain  Denies CP, cramping, dizziness/light headedness  She goes on to explain that PCP started the Lasix  on pt, but Dr. Elmira increased at OV on 1/30. Informed that I question pt's edema is heart related. Aware forwarding to MD for advisement. Aware that pt will most likely need some lab work as well.   Informed with that MD is in the hospital today, but will forward to him for advisement.  Aware it may be next week before office follows up with them depending on when MD can review/advise. Advised that if we are unable to call back today w/ advisement, to call back after hours/weekend if needed. Wife  verbalized understanding and agreeable to plan.

## 2023-12-21 NOTE — Telephone Encounter (Signed)
 Called patient and informed him of increase lasix . Patient will go to [employer] [employer] on Monday.

## 2023-12-22 ENCOUNTER — Other Ambulatory Visit: Payer: Self-pay

## 2023-12-22 ENCOUNTER — Emergency Department (HOSPITAL_COMMUNITY): Payer: Medicare Other

## 2023-12-22 ENCOUNTER — Encounter (HOSPITAL_COMMUNITY): Payer: Self-pay

## 2023-12-22 ENCOUNTER — Emergency Department (HOSPITAL_COMMUNITY)
Admission: EM | Admit: 2023-12-22 | Discharge: 2023-12-22 | Disposition: A | Discharge status: Home / Self Care | Payer: Medicare Other | Attending: Emergency Medicine | Admitting: Emergency Medicine

## 2023-12-22 DIAGNOSIS — W19XXXA Unspecified fall, initial encounter: Secondary | ICD-10-CM

## 2023-12-22 DIAGNOSIS — S0083XA Contusion of other part of head, initial encounter: Secondary | ICD-10-CM | POA: Diagnosis not present

## 2023-12-22 DIAGNOSIS — Z7982 Long term (current) use of aspirin: Secondary | ICD-10-CM | POA: Insufficient documentation

## 2023-12-22 DIAGNOSIS — S00211A Abrasion of right eyelid and periocular area, initial encounter: Secondary | ICD-10-CM | POA: Diagnosis present

## 2023-12-22 DIAGNOSIS — W06XXXA Fall from bed, initial encounter: Secondary | ICD-10-CM | POA: Diagnosis not present

## 2023-12-22 DIAGNOSIS — E875 Hyperkalemia: Secondary | ICD-10-CM | POA: Diagnosis not present

## 2023-12-22 DIAGNOSIS — D649 Anemia, unspecified: Secondary | ICD-10-CM | POA: Insufficient documentation

## 2023-12-22 LAB — BASIC METABOLIC PANEL WITH GFR
Anion gap: 8 (ref 5–15)
BUN: 40 mg/dL — ABNORMAL HIGH (ref 8–23)
CO2: 22 mmol/L (ref 22–32)
Calcium: 9.6 mg/dL (ref 8.9–10.3)
Chloride: 110 mmol/L (ref 98–111)
Creatinine, Ser: 2.1 mg/dL — ABNORMAL HIGH (ref 0.61–1.24)
GFR, Estimated: 31 mL/min — ABNORMAL LOW
Glucose, Bld: 100 mg/dL — ABNORMAL HIGH (ref 70–99)
Potassium: 5.6 mmol/L — ABNORMAL HIGH (ref 3.5–5.1)
Sodium: 140 mmol/L (ref 135–145)

## 2023-12-22 LAB — CBC WITH DIFFERENTIAL/PLATELET
Abs Immature Granulocytes: 0.04 10*3/uL (ref 0.00–0.07)
Basophils Absolute: 0.1 10*3/uL (ref 0.0–0.1)
Basophils Relative: 1 %
Eosinophils Absolute: 0.3 10*3/uL (ref 0.0–0.5)
Eosinophils Relative: 3 %
HCT: 38.7 % — ABNORMAL LOW (ref 39.0–52.0)
Hemoglobin: 11 g/dL — ABNORMAL LOW (ref 13.0–17.0)
Immature Granulocytes: 0 %
Lymphocytes Relative: 15 %
Lymphs Abs: 1.4 10*3/uL (ref 0.7–4.0)
MCH: 21.2 pg — ABNORMAL LOW (ref 26.0–34.0)
MCHC: 28.4 g/dL — ABNORMAL LOW (ref 30.0–36.0)
MCV: 74.4 fL — ABNORMAL LOW (ref 80.0–100.0)
Monocytes Absolute: 1 10*3/uL (ref 0.1–1.0)
Monocytes Relative: 11 %
Neutro Abs: 6.5 10*3/uL (ref 1.7–7.7)
Neutrophils Relative %: 70 %
Platelets: 258 10*3/uL (ref 150–400)
RBC: 5.2 MIL/uL (ref 4.22–5.81)
RDW: 20 % — ABNORMAL HIGH (ref 11.5–15.5)
WBC: 9.3 10*3/uL (ref 4.0–10.5)
nRBC: 0 % (ref 0.0–0.2)

## 2023-12-22 LAB — TROPONIN I (HIGH SENSITIVITY): Troponin I (High Sensitivity): 7 ng/L

## 2023-12-22 MED ORDER — LACTATED RINGERS IV BOLUS
500.0000 mL | Freq: Once | INTRAVENOUS | Status: AC
  Administered 2023-12-22: 500 mL via INTRAVENOUS

## 2023-12-22 MED ORDER — SODIUM ZIRCONIUM CYCLOSILICATE 10 G PO PACK
10.0000 g | PACK | Freq: Once | ORAL | Status: AC
  Administered 2023-12-22: 10 g via ORAL
  Filled 2023-12-22: qty 1

## 2023-12-22 NOTE — ED Provider Notes (Signed)
 [place] EMERGENCY DEPARTMENT AT [facility] Provider Note   CSN: [id number] Arrival date & time: 12/22/23  9490     History  Chief Complaint  Patient presents with   Jerry [name] Jerry Secundino Mickey. is a 83 y.o. male.   Fall     83 year old male with medical history significant for vascular dementia presenting to the emergency department as a nonlevel trauma after a fall out of bed.  The patient states that he is not on anticoagulation.  He states that he was trying to get out of bed and tripped falling forward landing onto the right side of his face.  He denies loss of consciousness.  He sustained an abrasion lateral to the right eye.  He denies any other injuries or complaints.  He arrives GCS 15, ABC intact.  Home Medications Prior to Admission medications   Medication Sig Start Date End Date Taking? Authorizing Provider  albuterol  (VENTOLIN  HFA) 108 (90 Base) MCG/ACT inhaler Inhale 2 puffs into the lungs every 6 (six) hours as needed for wheezing or shortness of breath. 07/12/21   Neysa Rama D, MD  ALPRAZolam  (XANAX ) 0.5 MG tablet Take 0.5 mg by mouth 2 (two) times daily as needed for anxiety.    [provider]  amLODipine  (NORVASC ) 10 MG tablet Take 10 mg by mouth at bedtime.    [provider]  aspirin  EC 81 MG tablet Take 81 mg by mouth daily. Swallow whole.    [provider]  Cholecalciferol  1000 UNITS tablet Take 1,000 Units by mouth daily.    [provider]  Cyanocobalamin  (VITAMIN B12 PO) Take 1 tablet by mouth daily.    [provider]  DULERA  200-5 MCG/ACT AERO Inhale 2 puffs into the lungs 2 (two) times daily.    [provider]  escitalopram  (LEXAPRO ) 5 MG tablet Take 1 tablet (5 mg total) by mouth daily. 06/06/23 12/13/23  Onuoha, Josephine C, NP  FeFum-FePoly-FA-B Cmp-C-Biot (FOLIVANE-PLUS) CAPS Take 1 capsule by mouth daily.    [provider]  furosemide  (LASIX ) 40 MG tablet Take 1 tablet  (40 mg total) by mouth 2 (two) times daily. 12/21/23   Patwardhan, Newman PARAS, MD  ipratropium-albuterol  (DUONEB) 0.5-2.5 (3) MG/3ML SOLN Inhale 3 mLs into the lungs every 2 (two) hours as needed.    [provider]  loperamide (IMODIUM) 2 MG capsule Take 4 mg by mouth as needed for diarrhea or loose stools.    [provider]  metoprolol  succinate (TOPROL -XL) 100 MG 24 hr tablet Take 1 tablet (100 mg total) by mouth daily. Take with or immediately following a meal. 12/13/23   Patwardhan, Manish J, MD  OLANZapine  (ZYPREXA ) 2.5 MG tablet Take 1 tablet (2.5 mg total) by mouth 2 (two) times daily as needed (agitation). 06/05/23   Randol Simmonds, MD      Allergies    Patient has no known allergies.    Review of Systems   Review of Systems  All other systems reviewed and are negative.   Physical Exam Updated Vital Signs BP 121/70   Pulse (!) 56   Temp 98 F (36.7 C) (Oral)   Resp 14   Ht 6' (1.829 m)   Wt 72.6 kg   SpO2 93%   BMI 21.70 kg/m  Physical Exam Vitals and nursing note reviewed.  Constitutional:      Appearance: He is well-developed.     Comments: GCS 15, ABC intact  HENT:  Head: Normocephalic.     Comments: Abrasion lateral to the eye, hemostatic and with mild TTP Eyes:     Conjunctiva/sclera: Conjunctivae normal.  Neck:     Comments: No midline tenderness to palpation of the cervical spine. ROM intact. Cardiovascular:     Rate and Rhythm: Normal rate and regular rhythm.  Pulmonary:     Effort: Pulmonary effort is normal. No respiratory distress.     Breath sounds: Normal breath sounds.  Chest:     Comments: Chest wall stable and non-tender to AP and lateral compression. Clavicles stable and non-tender to AP compression Abdominal:     Palpations: Abdomen is soft.     Tenderness: There is no abdominal tenderness.     Comments: Pelvis stable to lateral compression.  Musculoskeletal:     Cervical back: Neck supple.     Comments: No midline tenderness  to palpation of the thoracic or lumbar spine. Extremities atraumatic with intact ROM.   Skin:    General: Skin is warm and dry.  Neurological:     Mental Status: He is alert.     Comments: CN II-XII grossly intact. Moving all four extremities spontaneously and sensation grossly intact.     ED Results / Procedures / Treatments   Labs (all labs ordered are listed, but only abnormal results are displayed) Labs Reviewed  CBC WITH DIFFERENTIAL/PLATELET - Abnormal; Notable for the following components:      Result Value   Hemoglobin 11.0 (*)    HCT 38.7 (*)    MCV 74.4 (*)    MCH 21.2 (*)    MCHC 28.4 (*)    RDW 20.0 (*)    All other components within normal limits  BASIC METABOLIC PANEL - Abnormal; Notable for the following components:   Potassium 5.6 (*)    Glucose, Bld 100 (*)    BUN 40 (*)    Creatinine, Ser 2.10 (*)    GFR, Estimated 31 (*)    All other components within normal limits  TROPONIN I (HIGH SENSITIVITY)    EKG EKG Interpretation Date/Time:  Saturday December 22 2023 06:07:54 EST Ventricular Rate:  63 PR Interval:  188 QRS Duration:  93 QT Interval:  415 QTC Calculation: 425 R Axis:   102  Text Interpretation: Sinus rhythm Anterior infarct, age indeterminate ST elevation, consider inferior injury similar to previous EKG dated 13-Aug-24 Confirmed by Jerrol Agent ([id number]) on 12/22/2023 6:10:55 AM  Radiology CT Cervical Spine Wo Contrast Result Date: 12/22/2023 CLINICAL DATA:  83 year old male status post fall from bed. EXAM: CT CERVICAL SPINE WITHOUT CONTRAST TECHNIQUE: Multidetector CT imaging of the cervical spine was performed without intravenous contrast. Multiplanar CT image reconstructions were also generated. RADIATION DOSE REDUCTION: This exam was performed according to the departmental dose-optimization program which includes automated exposure control, adjustment of the mA and/or kV according to patient size and/or use of iterative reconstruction technique.  COMPARISON:  Head and face CT today. Prior cervical spine CT 04/27/2015. FINDINGS: Alignment: Chronic straightening of cervical lordosis. Stable cervicothoracic junction alignment, Bilateral posterior element alignment is within normal limits. Skull base and vertebrae: Bone mineralization is within normal limits for age. Visualized skull base is intact. No atlanto-occipital dissociation. C1 and C2 appear chronically degenerated, but intact and aligned. No acute osseous abnormality identified. Soft tissues and spinal canal: No prevertebral fluid or swelling. No visible canal hematoma. Negative visible noncontrast neck soft tissues with mild for age calcified carotid atherosclerosis. 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 Severe chronic adjacent C6 and C7 disc and endplate degeneration which has progressed, but evidence of developing ankylosis there also (sagittal image 39). Chronic facet arthropathy at the cervicothoracic junction. Upper chest: Visible upper thoracic levels appears stable. Negative lung apices. IMPRESSION: 1. No acute traumatic injury identified in the cervical spine. 2. Progressed chronic cervical spine degeneration since 2016, including degenerative ankylosis of C5-C6, developing ankylosis at C6-C7. Electronically Signed   By: VEAR Hurst M.D.   On: 12/22/2023 06:59   CT Maxillofacial Wo Contrast Result Date: 12/22/2023 CLINICAL DATA:  83 year old male status post fall from bed. EXAM: CT MAXILLOFACIAL WITHOUT CONTRAST TECHNIQUE: Multidetector CT imaging of the maxillofacial structures was performed. Multiplanar CT image reconstructions were also generated. RADIATION DOSE REDUCTION: This exam was performed according to the departmental dose-optimization program which includes automated exposure control, adjustment of the mA and/or kV according to patient size and/or use of iterative reconstruction technique. COMPARISON:   Head and cervical spine CT today. FINDINGS: Osseous: Mandible intact and normally located. No acute dental finding. Bilateral maxilla, zygoma, pterygoid bones appear intact. Central skull base intact. Chronic nasal bone fractures appear stable from previous head CT 04/17/2015. Orbits: No orbital wall fracture. Globes and intraorbital soft tissues appear symmetric and normal. Sinuses: Visualized paranasal sinuses and mastoids are stable and well aerated. Soft tissues: Negative visible noncontrast deep soft tissue spaces of the face. Limited intracranial: Stable to that reported separately. IMPRESSION: No acute traumatic injury identified in the Face. Chronic nasal bone fractures. Electronically Signed   By: VEAR Hurst M.D.   On: 12/22/2023 06:56   CT HEAD WO CONTRAST ( ) Result Date: 12/22/2023 CLINICAL DATA:  83 year old male status post fall from bed. EXAM: CT HEAD WITHOUT CONTRAST TECHNIQUE: Contiguous axial images were obtained from the base of the skull through the vertex without intravenous contrast. RADIATION DOSE REDUCTION: This exam was performed according to the departmental dose-optimization program which includes automated exposure control, adjustment of the mA and/or kV according to patient size and/or use of iterative reconstruction technique. COMPARISON:  Face and cervical spine CT reported separately today. Prior head CT 06/04/2023. FINDINGS: Brain: Cavum septum pellucidum, normal variant. Stable cerebral volume. Chronic dural calcification along the falx. No midline shift, ventriculomegaly, mass effect, evidence of mass lesion, intracranial hemorrhage or evidence of cortically based acute infarction. Stable gray-white matter differentiation throughout the brain. Patchy bilateral white matter hypodensity. Vascular: Calcified atherosclerosis at the skull base. No suspicious intracranial vascular hyperdensity. Skull: Stable.  No acute osseous abnormality identified. Sinuses/Orbits: Visualized paranasal  sinuses and mastoids are stable and well aerated. Other: No discrete orbit or scalp soft tissue injury identified. IMPRESSION: 1. No acute traumatic injury identified. 2. Stable non contrast CT appearance of the brain. Electronically Signed   By: VEAR Hurst M.D.   On: 12/22/2023 06:53    Procedures Procedures    Medications Ordered in ED Medications  lactated ringers  bolus 500 mL (0 mLs Intravenous Stopped 12/22/23 0836)  sodium zirconium cyclosilicate  (LOKELMA ) packet 10 g (10 g Oral Given 12/22/23 9247)    ED Course/ Medical Decision Making/ A&P Clinical Course as of 12/22/23 1604  Sat Dec 22, 2023  0727 Potassium(!): 5.6 [JL]  0727 Creatinine(!): 2.10 [JL]  0727 BUN(!): 40 [JL]    Clinical Course User Index [JL] Jerrol Agent, MD  Medical Decision Making Amount and/or Complexity of Data Reviewed Labs: ordered. Decision-making details documented in ED Course. Radiology: ordered.  Risk Prescription drug management.    83 year old male with medical history significant for vascular dementia presenting to the emergency department as a nonlevel trauma after a fall out of bed.  The patient states that he is not on anticoagulation.  He states that he was trying to get out of bed and tripped falling forward landing onto the right side of his face.  He denies loss of consciousness.  He sustained an abrasion lateral to the right eye.  He denies any other injuries or complaints.  He arrives GCS 15, ABC intact.  On arrival, the patient was vitally stable.  Physical exam as per above, patient presenting with mild head and facial trauma.  CT Head, Face, Cervical Spine:  IMPRESSION:  1. No acute traumatic injury identified.  2. Stable non contrast CT appearance of the brain.   IMPRESSION:  No acute traumatic injury identified in the Face. Chronic nasal bone  fractures.   IMPRESSION:  1. No acute traumatic injury identified in the cervical spine.  2.  Progressed chronic cervical spine degeneration since 2016,  including degenerative ankylosis of C5-C6, developing ankylosis at  C6-C7.    Labs: BMP with serum creatinine elevated slightly compared to baseline CKD with a creatinine of 2.1, BUN of 40, potassium mildly elevated at 5.6, CBC without a leukocytosis, mild anemia to 11.0, cardiac troponin 7.  The patient was administered a 500 cc LR bolus as well as Lokelma .  His trauma imaging is reassuring.  Plan at time of signout to reassess the patient following bolus, plan for likely discharge with close outpatient follow-up after coordination with family.  Signout given to Dr. Towana at 0 700.    Final Clinical Impression(s) / ED Diagnoses Final diagnoses:  Fall, initial encounter  Contusion of face, initial encounter    Rx / DC Orders ED Discharge Orders     None         Jerrol Agent, MD 12/22/23 [phone]

## 2023-12-22 NOTE — ED Triage Notes (Signed)
 Coming from home. Patient rolled out of bed and did hit head. Denies thinners and LOC

## 2023-12-22 NOTE — ED Provider Notes (Signed)
 Signout from Dr. Jerrol.  83 year old male dementia here after a fall.  Workup showing mild elevation of creatinine and potassium.  Getting IV fluids and Lokelma .  Plan is for discharge back to his facility. Physical Exam  BP 121/70   Pulse (!) 56   Temp 98 F (36.7 C) (Oral)   Resp 14   Ht 6' (1.829 m)   Wt 72.6 kg   SpO2 93%   BMI 21.70 kg/m   Physical Exam  Procedures  Procedures  ED Course / MDM   Clinical Course as of 12/22/23 0751  Sat Dec 22, 2023  0727 Potassium(!): 5.6 [JL]  0727 Creatinine(!): 2.10 [JL]  0727 BUN(!): 40 [JL]    Clinical Course User Index [JL] Jerrol Agent, MD   Medical Decision Making Amount and/or Complexity of Data Reviewed Labs: ordered. Decision-making details documented in ED Course. Radiology: ordered.  Risk Prescription drug management.   I spoke with the patient's wife and updated her on the plan.  She is comfortable with him returning home although she says he does not have his walker here.  I let her know we would help him get out with a wheelchair.       Jerry Ozell BROCKS, MD 12/22/23 [phone]

## 2023-12-25 ENCOUNTER — Other Ambulatory Visit (HOSPITAL_COMMUNITY): Payer: Self-pay | Admitting: Nurse Practitioner

## 2023-12-25 DIAGNOSIS — N99528 Other complication of other external stoma of urinary tract: Secondary | ICD-10-CM

## 2023-12-25 DIAGNOSIS — L24B3 Irritant contact dermatitis related to fecal or urinary stoma or fistula: Secondary | ICD-10-CM

## 2023-12-28 LAB — COMPREHENSIVE METABOLIC PANEL
ALT: 9 [IU]/L (ref 0–44)
AST: 7 [IU]/L (ref 0–40)
Albumin: 3.9 g/dL (ref 3.8–4.8)
Alkaline Phosphatase: 87 [IU]/L (ref 44–121)
BUN/Creatinine Ratio: 16 (ref 10–24)
BUN: 31 mg/dL — ABNORMAL HIGH (ref 8–27)
Bilirubin Total: 0.4 mg/dL (ref 0.0–1.2)
CO2: 21 mmol/L (ref 20–29)
Calcium: 9.5 mg/dL (ref 8.6–10.2)
Chloride: 108 mmol/L — ABNORMAL HIGH (ref 96–106)
Creatinine, Ser: 1.96 mg/dL — ABNORMAL HIGH (ref 0.76–1.27)
Globulin, Total: 2.7 g/dL (ref 1.5–4.5)
Glucose: 85 mg/dL (ref 70–99)
Potassium: 4.9 mmol/L (ref 3.5–5.2)
Sodium: 142 mmol/L (ref 134–144)
Total Protein: 6.6 g/dL (ref 6.0–8.5)
eGFR: 34 mL/min/{1.73_m2} — ABNORMAL LOW (ref >59)

## 2023-12-28 LAB — HEMOGLOBIN A1C
Est. average glucose Bld gHb Est-mCnc: 131 mg/dL
Hgb A1c MFr Bld: 6.2 % — ABNORMAL HIGH (ref 4.8–5.6)

## 2023-12-28 LAB — CBC
Hematocrit: 38.8 % (ref 37.5–51.0)
Hemoglobin: 11.2 g/dL — ABNORMAL LOW (ref 13.0–17.7)
MCH: 21 pg — ABNORMAL LOW (ref 26.6–33.0)
MCHC: 28.9 g/dL — ABNORMAL LOW (ref 31.5–35.7)
MCV: 73 fL — ABNORMAL LOW (ref 79–97)
Platelets: 310 10*3/uL (ref 150–450)
RBC: 5.34 x10E6/uL (ref 4.14–5.80)
RDW: 19 % — ABNORMAL HIGH (ref 11.6–15.4)
WBC: 8.6 10*3/uL (ref 3.4–10.8)

## 2023-12-28 LAB — PRO B NATRIURETIC PEPTIDE: NT-Pro BNP: 1952 pg/mL — ABNORMAL HIGH (ref 0–486)

## 2024-01-02 NOTE — Progress Notes (Signed)
Kidney function is improved, fluid congestion noted. Continue current medications. Will await echocardiogram next week before further recommendations.   Thanks MJP

## 2024-01-07 ENCOUNTER — Ambulatory Visit (HOSPITAL_COMMUNITY): Payer: Medicare Other | Attending: Cardiology

## 2024-01-07 DIAGNOSIS — R600 Localized edema: Secondary | ICD-10-CM | POA: Diagnosis present

## 2024-01-07 LAB — ECHOCARDIOGRAM COMPLETE
Area-P 1/2: 6.32 cm2
S' Lateral: 2.5 cm

## 2024-01-11 NOTE — Progress Notes (Signed)
 There is elevated pressure in the right side of the heart, that could be causing the leg swelling. Continue lasix 40 mg daily. Will discuss further at office visit in 4 weeks.  Thanks MJP

## 2024-01-14 ENCOUNTER — Telehealth: Payer: Self-pay | Admitting: Cardiology

## 2024-01-14 NOTE — Telephone Encounter (Signed)
 Attempted to call the pts wife back at both numbers listed and no answer at either.. will try again at a later time.

## 2024-01-14 NOTE — Telephone Encounter (Signed)
 Wife is returning call. Wife ask when you call back and they dont answer please leave a detail message. Please advise

## 2024-01-15 ENCOUNTER — Ambulatory Visit (HOSPITAL_COMMUNITY)
Admission: RE | Admit: 2024-01-15 | Discharge: 2024-01-15 | Disposition: A | Discharge status: Home / Self Care | Payer: Medicare Other | Source: Ambulatory Visit | Attending: Plastic Surgery | Admitting: Plastic Surgery

## 2024-01-15 DIAGNOSIS — Z8551 Personal history of malignant neoplasm of bladder: Secondary | ICD-10-CM | POA: Diagnosis not present

## 2024-01-15 DIAGNOSIS — L24B3 Irritant contact dermatitis related to fecal or urinary stoma or fistula: Secondary | ICD-10-CM | POA: Diagnosis not present

## 2024-01-15 DIAGNOSIS — Z436 Encounter for attention to other artificial openings of urinary tract: Secondary | ICD-10-CM

## 2024-01-15 DIAGNOSIS — K9419 Other complications of enterostomy: Secondary | ICD-10-CM | POA: Insufficient documentation

## 2024-01-15 NOTE — Discharge Instructions (Signed)
 Pouch change procedure:  REMOVE OLD POUCH  Clean with water if needed.  Apply powder and seal with skin prep *DO NOT NEED ADHESIVE REMOVER WIPES Apply barrier ring around stoma Apply pre-sized pouch, do not have to cut.  Apply ostomy belt  Supplies:  Skin barrier wipes  Item # [id number] NO STING Powder ITEM # [id number] Drain bag  [id number] Barrier rings ITEM # [id number] 1 piece convex pre-sized pouch ITEM # [id number] Medium ostomy belt ITEM # [id number]

## 2024-01-15 NOTE — Progress Notes (Signed)
 [facility]   Reason for visit:  RLQ ileal conduit  Some leaks remain HPI:  Bladder cancer with ileal conduit 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   Malnutrition of moderate degree 06/28/2023   Mixed hyperlipidemia 07/21/2021   OSA (obstructive sleep apnea) 07/12/2021   NPSG- 03/16/21- AHI 10.3/ hr, desaturation to 87%, body weight 190 lbs     Renal insufficiency 04/17/2015   Syncope 04/17/2015   Tremor 07/21/2021   Vascular dementia (HCC)    Vitamin D deficiency 07/21/2021   Family History  Problem Relation Age of Onset   Diabetes Mother    Diabetes Father    No Known Allergies Current Outpatient Medications  Medication Sig Dispense Refill Last Dose/Taking   albuterol (VENTOLIN HFA) 108 (90 Base) MCG/ACT inhaler Inhale 2 puffs into the lungs every 6 (six) hours as needed for wheezing or shortness of breath. 8 g 6    ALPRAZolam (XANAX) 0.5 MG tablet Take 0.5 mg by mouth 2 (two) times daily as needed for anxiety.      amLODipine (NORVASC) 10 MG tablet Take 10 mg by mouth at bedtime.      aspirin EC 81 MG tablet Take 81 mg by mouth daily. Swallow whole.      Cholecalciferol 1000 UNITS tablet Take 1,000 Units by mouth daily.      Cyanocobalamin (VITAMIN B12 PO) Take 1 tablet by mouth daily.      DULERA 200-5 MCG/ACT AERO Inhale 2 puffs into the lungs 2 (two) times daily.      escitalopram (LEXAPRO) 5 MG tablet Take 1 tablet (5 mg total) by mouth daily. 30 tablet 0    FeFum-FePoly-FA-B Cmp-C-Biot  (FOLIVANE-PLUS) CAPS Take 1 capsule by mouth daily.      furosemide (LASIX) 40 MG tablet Take 1 tablet (40 mg total) by mouth 2 (two) times daily. 180 tablet 1    ipratropium-albuterol (DUONEB) 0.5-2.5 (3) MG/3ML SOLN Inhale 3 mLs into the lungs every 2 (two) hours as needed.      loperamide (IMODIUM) 2 MG capsule Take 4 mg by mouth as needed for diarrhea or loose stools.      metoprolol succinate (TOPROL-XL) 100 MG 24 hr tablet Take 1 tablet (100 mg total) by mouth daily. Take with or immediately following a meal. 90 tablet 3    OLANZapine (ZYPREXA) 2.5 MG tablet Take 1 tablet (2.5 mg total) by mouth 2 (two) times daily as needed (agitation). 60 tablet 0    No current facility-administered medications for this encounter.   ROS  Review of Systems  Constitutional:  Positive for fatigue and unexpected weight change.  Genitourinary:        Ileal conduit  Skin:  Positive for color change and rash.       Peristomal irritation  Psychiatric/Behavioral: Negative.    All other systems reviewed and are negative.  Vital signs:  BP (P) 123/67 (BP Location: Right Arm)   Pulse (P) 99   Temp (P) 98.3 F (  36.8 C) (Oral)   Resp (P) 16   SpO2 (!) (P) 85%  Exam:  Physical Exam Vitals reviewed.  Constitutional:      Appearance: Normal appearance.  Cardiovascular:     Rate and Rhythm: Normal rate and regular rhythm.     Pulses: Normal pulses.     Heart sounds: Normal heart sounds.  Pulmonary:     Effort: Pulmonary effort is normal.  Abdominal:     Palpations: Abdomen is soft.  Musculoskeletal:        General: Normal range of motion.  Skin:    General: Skin is warm and dry.     Findings: Erythema present.  Neurological:     General: No focal deficit present.     Mental Status: He is alert.     Comments: Forgetfulness per family member  Psychiatric:        Mood and Affect: Mood normal.        Behavior: Behavior normal.     Stoma type/location:  RLQ ileal conduit Stomal assessment/size:   1" pink moist and budded Peristomal assessment:  some erythema  Had stopped using convex we gave at last visit and is using flat again. Needs convexity to promote seal.   Treatment options for stomal/peristomal skin: stoma powder and skin prep  barrier ring and 1 piece convex pouch.  Adding ostomy belt again as he has not been wearing it and it will improve his seal. I will order him presized pouches as cutting the barrier is difficult due to visual and dexterity limitations.  Output: clear yellow urine Ostomy pouching: 1pc. Convex urostomy Education provided:  performed pouch change with patient again. He prefers this pouch system.  I will renew his orders.     Impression/dx  Ileal conduit   Discussion  See above  supplies need updating Plan  Update [place]    Visit time: 55 minutes.   Mike Gip FNP-BC

## 2024-01-21 NOTE — Telephone Encounter (Signed)
 Appears results were given on 01/16/24 see results message .Jerry [name]

## 2024-01-22 DIAGNOSIS — Z436 Encounter for attention to other artificial openings of urinary tract: Secondary | ICD-10-CM | POA: Insufficient documentation

## 2024-01-27 ENCOUNTER — Inpatient Hospital Stay (HOSPITAL_COMMUNITY)
Admission: EM | Admit: 2024-01-27 | Discharge: 2024-02-03 | DRG: 871 | Disposition: A | Discharge status: Home Health | Attending: Internal Medicine | Admitting: Internal Medicine

## 2024-01-27 ENCOUNTER — Emergency Department (HOSPITAL_COMMUNITY)

## 2024-01-27 ENCOUNTER — Other Ambulatory Visit: Payer: Self-pay

## 2024-01-27 ENCOUNTER — Encounter (HOSPITAL_COMMUNITY): Payer: Self-pay

## 2024-01-27 DIAGNOSIS — I214 Non-ST elevation (NSTEMI) myocardial infarction: Secondary | ICD-10-CM | POA: Diagnosis not present

## 2024-01-27 DIAGNOSIS — E872 Acidosis, unspecified: Secondary | ICD-10-CM | POA: Diagnosis present

## 2024-01-27 DIAGNOSIS — D631 Anemia in chronic kidney disease: Secondary | ICD-10-CM | POA: Diagnosis present

## 2024-01-27 DIAGNOSIS — J9601 Acute respiratory failure with hypoxia: Secondary | ICD-10-CM | POA: Diagnosis present

## 2024-01-27 DIAGNOSIS — J189 Pneumonia, unspecified organism: Principal | ICD-10-CM

## 2024-01-27 DIAGNOSIS — Z8546 Personal history of malignant neoplasm of prostate: Secondary | ICD-10-CM

## 2024-01-27 DIAGNOSIS — N179 Acute kidney failure, unspecified: Secondary | ICD-10-CM | POA: Diagnosis present

## 2024-01-27 DIAGNOSIS — F0154 Vascular dementia, unspecified severity, with anxiety: Secondary | ICD-10-CM | POA: Diagnosis present

## 2024-01-27 DIAGNOSIS — I21A1 Myocardial infarction type 2: Secondary | ICD-10-CM

## 2024-01-27 DIAGNOSIS — Z87442 Personal history of urinary calculi: Secondary | ICD-10-CM

## 2024-01-27 DIAGNOSIS — R6520 Severe sepsis without septic shock: Secondary | ICD-10-CM | POA: Diagnosis present

## 2024-01-27 DIAGNOSIS — I10 Essential (primary) hypertension: Secondary | ICD-10-CM | POA: Diagnosis not present

## 2024-01-27 DIAGNOSIS — I472 Ventricular tachycardia, unspecified: Secondary | ICD-10-CM | POA: Diagnosis present

## 2024-01-27 DIAGNOSIS — J9602 Acute respiratory failure with hypercapnia: Secondary | ICD-10-CM | POA: Diagnosis present

## 2024-01-27 DIAGNOSIS — J440 Chronic obstructive pulmonary disease with acute lower respiratory infection: Secondary | ICD-10-CM | POA: Diagnosis present

## 2024-01-27 DIAGNOSIS — Z833 Family history of diabetes mellitus: Secondary | ICD-10-CM

## 2024-01-27 DIAGNOSIS — A419 Sepsis, unspecified organism: Principal | ICD-10-CM | POA: Diagnosis present

## 2024-01-27 DIAGNOSIS — I129 Hypertensive chronic kidney disease with stage 1 through stage 4 chronic kidney disease, or unspecified chronic kidney disease: Secondary | ICD-10-CM | POA: Diagnosis present

## 2024-01-27 DIAGNOSIS — G4700 Insomnia, unspecified: Secondary | ICD-10-CM | POA: Diagnosis present

## 2024-01-27 DIAGNOSIS — F32A Depression, unspecified: Secondary | ICD-10-CM | POA: Diagnosis present

## 2024-01-27 DIAGNOSIS — R54 Age-related physical debility: Secondary | ICD-10-CM | POA: Diagnosis present

## 2024-01-27 DIAGNOSIS — Z7951 Long term (current) use of inhaled steroids: Secondary | ICD-10-CM

## 2024-01-27 DIAGNOSIS — F0153 Vascular dementia, unspecified severity, with mood disturbance: Secondary | ICD-10-CM | POA: Diagnosis present

## 2024-01-27 DIAGNOSIS — M7989 Other specified soft tissue disorders: Secondary | ICD-10-CM

## 2024-01-27 DIAGNOSIS — R0602 Shortness of breath: Secondary | ICD-10-CM | POA: Diagnosis present

## 2024-01-27 DIAGNOSIS — F01518 Vascular dementia, unspecified severity, with other behavioral disturbance: Secondary | ICD-10-CM | POA: Diagnosis present

## 2024-01-27 DIAGNOSIS — N1832 Chronic kidney disease, stage 3b: Secondary | ICD-10-CM | POA: Diagnosis present

## 2024-01-27 DIAGNOSIS — Z9049 Acquired absence of other specified parts of digestive tract: Secondary | ICD-10-CM

## 2024-01-27 DIAGNOSIS — I2722 Pulmonary hypertension due to left heart disease: Secondary | ICD-10-CM | POA: Diagnosis present

## 2024-01-27 DIAGNOSIS — J441 Chronic obstructive pulmonary disease with (acute) exacerbation: Secondary | ICD-10-CM | POA: Diagnosis present

## 2024-01-27 DIAGNOSIS — E875 Hyperkalemia: Secondary | ICD-10-CM | POA: Diagnosis present

## 2024-01-27 DIAGNOSIS — R7401 Elevation of levels of liver transaminase levels: Secondary | ICD-10-CM | POA: Diagnosis present

## 2024-01-27 DIAGNOSIS — R7989 Other specified abnormal findings of blood chemistry: Secondary | ICD-10-CM

## 2024-01-27 DIAGNOSIS — Z7982 Long term (current) use of aspirin: Secondary | ICD-10-CM

## 2024-01-27 DIAGNOSIS — I2510 Atherosclerotic heart disease of native coronary artery without angina pectoris: Secondary | ICD-10-CM | POA: Diagnosis present

## 2024-01-27 DIAGNOSIS — G4733 Obstructive sleep apnea (adult) (pediatric): Secondary | ICD-10-CM | POA: Diagnosis present

## 2024-01-27 DIAGNOSIS — J159 Unspecified bacterial pneumonia: Secondary | ICD-10-CM | POA: Diagnosis present

## 2024-01-27 DIAGNOSIS — Z8551 Personal history of malignant neoplasm of bladder: Secondary | ICD-10-CM

## 2024-01-27 DIAGNOSIS — J4520 Mild intermittent asthma, uncomplicated: Secondary | ICD-10-CM | POA: Diagnosis present

## 2024-01-27 DIAGNOSIS — Z1152 Encounter for screening for COVID-19: Secondary | ICD-10-CM

## 2024-01-27 DIAGNOSIS — K5900 Constipation, unspecified: Secondary | ICD-10-CM | POA: Diagnosis not present

## 2024-01-27 DIAGNOSIS — D509 Iron deficiency anemia, unspecified: Secondary | ICD-10-CM | POA: Diagnosis present

## 2024-01-27 DIAGNOSIS — E782 Mixed hyperlipidemia: Secondary | ICD-10-CM | POA: Diagnosis present

## 2024-01-27 DIAGNOSIS — Z79899 Other long term (current) drug therapy: Secondary | ICD-10-CM

## 2024-01-27 DIAGNOSIS — F411 Generalized anxiety disorder: Secondary | ICD-10-CM | POA: Diagnosis present

## 2024-01-27 DIAGNOSIS — G8929 Other chronic pain: Secondary | ICD-10-CM | POA: Diagnosis present

## 2024-01-27 LAB — COMPREHENSIVE METABOLIC PANEL
ALT: 301 U/L — ABNORMAL HIGH (ref 0–44)
AST: 375 U/L — ABNORMAL HIGH (ref 15–41)
Albumin: 3.4 g/dL — ABNORMAL LOW (ref 3.5–5.0)
Alkaline Phosphatase: 98 U/L (ref 38–126)
Anion gap: 14 (ref 5–15)
BUN: 53 mg/dL — ABNORMAL HIGH (ref 8–23)
CO2: 22 mmol/L (ref 22–32)
Calcium: 9.3 mg/dL (ref 8.9–10.3)
Chloride: 109 mmol/L (ref 98–111)
Creatinine, Ser: 2.41 mg/dL — ABNORMAL HIGH (ref 0.61–1.24)
GFR, Estimated: 26 mL/min — ABNORMAL LOW (ref >60)
Glucose, Bld: 140 mg/dL — ABNORMAL HIGH (ref 70–99)
Potassium: 5.1 mmol/L (ref 3.5–5.1)
Sodium: 145 mmol/L (ref 135–145)
Total Bilirubin: 0.6 mg/dL (ref 0.0–1.2)
Total Protein: 6.5 g/dL (ref 6.5–8.1)

## 2024-01-27 LAB — CBC WITH DIFFERENTIAL/PLATELET
Abs Immature Granulocytes: 0 10*3/uL (ref 0.00–0.07)
Basophils Absolute: 0 10*3/uL (ref 0.0–0.1)
Basophils Relative: 0 %
Eosinophils Absolute: 0 10*3/uL (ref 0.0–0.5)
Eosinophils Relative: 0 %
HCT: 40.6 % (ref 39.0–52.0)
Hemoglobin: 11.1 g/dL — ABNORMAL LOW (ref 13.0–17.0)
Lymphocytes Relative: 4 %
Lymphs Abs: 1 10*3/uL (ref 0.7–4.0)
MCH: 21.1 pg — ABNORMAL LOW (ref 26.0–34.0)
MCHC: 27.3 g/dL — ABNORMAL LOW (ref 30.0–36.0)
MCV: 77.3 fL — ABNORMAL LOW (ref 80.0–100.0)
Monocytes Absolute: 1.5 10*3/uL — ABNORMAL HIGH (ref 0.1–1.0)
Monocytes Relative: 6 %
Neutro Abs: 22.1 10*3/uL — ABNORMAL HIGH (ref 1.7–7.7)
Neutrophils Relative %: 90 %
Platelets: 233 10*3/uL (ref 150–400)
RBC: 5.25 MIL/uL (ref 4.22–5.81)
RDW: 21.5 % — ABNORMAL HIGH (ref 11.5–15.5)
WBC: 24.5 10*3/uL — ABNORMAL HIGH (ref 4.0–10.5)
nRBC: 0 /100{WBCs}
nRBC: 0.1 % (ref 0.0–0.2)

## 2024-01-27 LAB — I-STAT VENOUS BLOOD GAS, ED
Acid-base deficit: 7 mmol/L — ABNORMAL HIGH (ref 0.0–2.0)
Bicarbonate: 24.3 mmol/L (ref 20.0–28.0)
Calcium, Ion: 1.31 mmol/L (ref 1.15–1.40)
HCT: 42 % (ref 39.0–52.0)
Hemoglobin: 14.3 g/dL (ref 13.0–17.0)
O2 Saturation: 68 %
Potassium: 5 mmol/L (ref 3.5–5.1)
Sodium: 145 mmol/L (ref 135–145)
TCO2: 27 mmol/L (ref 22–32)
pCO2, Ven: 73.9 mmHg (ref 44–60)
pH, Ven: 7.125 — CL (ref 7.25–7.43)
pO2, Ven: 48 mmHg — ABNORMAL HIGH (ref 32–45)

## 2024-01-27 LAB — I-STAT CG4 LACTIC ACID, ED: Lactic Acid, Venous: 3.3 mmol/L (ref 0.5–1.9)

## 2024-01-27 LAB — I-STAT ARTERIAL BLOOD GAS, ED
Acid-base deficit: 4 mmol/L — ABNORMAL HIGH (ref 0.0–2.0)
Bicarbonate: 22.9 mmol/L (ref 20.0–28.0)
Calcium, Ion: 1.27 mmol/L (ref 1.15–1.40)
HCT: 36 % — ABNORMAL LOW (ref 39.0–52.0)
Hemoglobin: 12.2 g/dL — ABNORMAL LOW (ref 13.0–17.0)
O2 Saturation: 96 %
Patient temperature: 97.8
Potassium: 5.5 mmol/L — ABNORMAL HIGH (ref 3.5–5.1)
Sodium: 143 mmol/L (ref 135–145)
TCO2: 24 mmol/L (ref 22–32)
pCO2 arterial: 47.3 mmHg (ref 32–48)
pH, Arterial: 7.291 — ABNORMAL LOW (ref 7.35–7.45)
pO2, Arterial: 94 mmHg (ref 83–108)

## 2024-01-27 LAB — RESP PANEL BY RT-PCR (RSV, FLU A&B, COVID)  RVPGX2
Influenza A by PCR: NEGATIVE
Influenza B by PCR: NEGATIVE
Resp Syncytial Virus by PCR: NEGATIVE
SARS Coronavirus 2 by RT PCR: NEGATIVE

## 2024-01-27 LAB — TROPONIN I (HIGH SENSITIVITY)
Troponin I (High Sensitivity): 775 ng/L (ref <18)
Troponin I (High Sensitivity): 5148 ng/L (ref <18)

## 2024-01-27 LAB — D-DIMER, QUANTITATIVE: D-Dimer, Quant: >20 ug{FEU}/mL — ABNORMAL HIGH (ref 0.00–0.50)

## 2024-01-27 LAB — BRAIN NATRIURETIC PEPTIDE: B Natriuretic Peptide: 36.8 pg/mL (ref 0.0–100.0)

## 2024-01-27 MED ORDER — AZITHROMYCIN 500 MG PO TABS
500.0000 mg | ORAL_TABLET | Freq: Every day | ORAL | Status: DC
  Administered 2024-01-28 – 2024-01-30 (×3): 500 mg via ORAL
  Filled 2024-01-27 (×3): qty 1

## 2024-01-27 MED ORDER — ASPIRIN 81 MG PO TBEC
81.0000 mg | DELAYED_RELEASE_TABLET | Freq: Every day | ORAL | Status: DC
  Administered 2024-01-28 – 2024-02-03 (×7): 81 mg via ORAL
  Filled 2024-01-27 (×7): qty 1

## 2024-01-27 MED ORDER — HEPARIN BOLUS VIA INFUSION
4000.0000 [IU] | Freq: Once | INTRAVENOUS | Status: AC
  Administered 2024-01-27: 4000 [IU] via INTRAVENOUS
  Filled 2024-01-27: qty 4000

## 2024-01-27 MED ORDER — SODIUM CHLORIDE 0.9 % IV SOLN
500.0000 mg | Freq: Once | INTRAVENOUS | Status: AC
  Administered 2024-01-27: 500 mg via INTRAVENOUS
  Filled 2024-01-27: qty 5

## 2024-01-27 MED ORDER — ACETAMINOPHEN 650 MG RE SUPP: 650.0000 mg | Freq: Four times a day (QID) | RECTAL | Status: DC | PRN

## 2024-01-27 MED ORDER — LACTATED RINGERS IV SOLN: INTRAVENOUS | Status: AC

## 2024-01-27 MED ORDER — AMLODIPINE BESYLATE 10 MG PO TABS
10.0000 mg | ORAL_TABLET | Freq: Every day | ORAL | Status: DC
Start: 2024-01-27 — End: 2024-02-03
  Administered 2024-01-28 – 2024-02-02 (×6): 10 mg via ORAL
  Filled 2024-01-27 (×6): qty 1

## 2024-01-27 MED ORDER — LACTATED RINGERS IV BOLUS
500.0000 mL | Freq: Once | INTRAVENOUS | Status: AC
  Administered 2024-01-27: 500 mL via INTRAVENOUS

## 2024-01-27 MED ORDER — OLANZAPINE 2.5 MG PO TABS
2.5000 mg | ORAL_TABLET | Freq: Two times a day (BID) | ORAL | Status: DC | PRN
  Administered 2024-01-29 – 2024-02-02 (×4): 2.5 mg via ORAL
  Filled 2024-01-27 (×5): qty 1

## 2024-01-27 MED ORDER — HEPARIN (PORCINE) 25000 UT/250ML-% IV SOLN
1200.0000 [IU]/h | INTRAVENOUS | Status: DC
  Administered 2024-01-27: 850 [IU]/h via INTRAVENOUS
  Administered 2024-01-28: 1050 [IU]/h via INTRAVENOUS
  Filled 2024-01-27 (×2): qty 250

## 2024-01-27 MED ORDER — ENOXAPARIN SODIUM 40 MG/0.4ML IJ SOSY
40.0000 mg | PREFILLED_SYRINGE | INTRAMUSCULAR | Status: DC
Start: 2024-01-27 — End: 2024-01-27

## 2024-01-27 MED ORDER — OXYCODONE HCL 5 MG PO TABS: 5.0000 mg | ORAL_TABLET | ORAL | Status: DC | PRN

## 2024-01-27 MED ORDER — ASPIRIN 325 MG PO TBEC
325.0000 mg | DELAYED_RELEASE_TABLET | Freq: Once | ORAL | Status: AC
  Administered 2024-01-27: 325 mg via ORAL
  Filled 2024-01-27: qty 1

## 2024-01-27 MED ORDER — HYDROCHLOROTHIAZIDE 25 MG PO TABS
12.5000 mg | ORAL_TABLET | Freq: Every day | ORAL | Status: DC
Start: 2024-01-28 — End: 2024-01-31
  Administered 2024-01-28 – 2024-01-30 (×3): 12.5 mg via ORAL
  Filled 2024-01-27 (×3): qty 1

## 2024-01-27 MED ORDER — NITROGLYCERIN 0.4 MG SL SUBL: 0.4000 mg | SUBLINGUAL_TABLET | SUBLINGUAL | Status: DC | PRN

## 2024-01-27 MED ORDER — METOPROLOL SUCCINATE ER 25 MG PO TB24: 25.0000 mg | ORAL_TABLET | Freq: Every day | ORAL | Status: DC

## 2024-01-27 MED ORDER — ONDANSETRON HCL 4 MG/2ML IJ SOLN: 4.0000 mg | Freq: Four times a day (QID) | INTRAMUSCULAR | Status: DC | PRN

## 2024-01-27 MED ORDER — SODIUM CHLORIDE 0.9 % IV SOLN
1.0000 g | Freq: Once | INTRAVENOUS | Status: AC
  Administered 2024-01-27: 1 g via INTRAVENOUS
  Filled 2024-01-27: qty 10

## 2024-01-27 MED ORDER — ESCITALOPRAM OXALATE 10 MG PO TABS
5.0000 mg | ORAL_TABLET | Freq: Every day | ORAL | Status: DC
  Administered 2024-01-28 – 2024-02-03 (×7): 5 mg via ORAL
  Filled 2024-01-27 (×7): qty 1

## 2024-01-27 MED ORDER — ONDANSETRON HCL 4 MG PO TABS: 4.0000 mg | ORAL_TABLET | Freq: Four times a day (QID) | ORAL | Status: DC | PRN

## 2024-01-27 MED ORDER — ACETAMINOPHEN 325 MG PO TABS: 650.0000 mg | ORAL_TABLET | Freq: Four times a day (QID) | ORAL | Status: DC | PRN

## 2024-01-27 MED ORDER — METOPROLOL TARTRATE 25 MG PO TABS
25.0000 mg | ORAL_TABLET | Freq: Two times a day (BID) | ORAL | Status: DC
  Administered 2024-01-28 – 2024-01-30 (×6): 25 mg via ORAL
  Filled 2024-01-27 (×6): qty 1

## 2024-01-27 MED ORDER — LACTATED RINGERS IV BOLUS
1000.0000 mL | Freq: Once | INTRAVENOUS | Status: AC
  Administered 2024-01-28: 1000 mL via INTRAVENOUS

## 2024-01-27 MED ORDER — SODIUM CHLORIDE 0.9 % IV SOLN
2.0000 g | INTRAVENOUS | Status: AC
  Administered 2024-01-28 – 2024-02-02 (×6): 2 g via INTRAVENOUS
  Filled 2024-01-27 (×6): qty 20

## 2024-01-27 MED ORDER — ALPRAZOLAM 0.5 MG PO TABS
0.5000 mg | ORAL_TABLET | Freq: Two times a day (BID) | ORAL | Status: DC | PRN
  Administered 2024-01-28 – 2024-02-03 (×6): 0.5 mg via ORAL
  Filled 2024-01-27 (×6): qty 1

## 2024-01-27 NOTE — ED Triage Notes (Signed)
"  Was on the toilet having a bowel movement and had a near syncopal episode, did not fall off of the toilet, wife called EMS and fire got him off of the toilet and laid him on the floor on his back and put his legs in the air. When we arrived patient's only complaint was shortness of breath possibly triggered from laying flat and history of asthma. Was noted to have wheezing,  on our arrival. Gave 2 duoneb in route. Unable to get IV access. O2 sats were WDL" per EMS

## 2024-01-27 NOTE — H&P (Signed)
 History and Physical    Jerry [name]. OZH:[id number] DOB: 04/12/1941 DOA: 01/27/2024  PCP: [name], MD   Chief Complaint:  sob  HPI: Jerry [name]. is a 83 y.o. 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  Patient states that today he was unable to walk around his house without feeling dyspneic.  He is noticed progressively worsening breathing over the last several weeks as well as a cough.  He went to the bathroom and then had difficulty standing up due to fatigue and weakness.  EMS was called and he was found to be hypoxic.  He was transported to the ER for further assessment.  On arrival he was a febrile and hemodynamically stable.  Labs were obtained which showed BMP 36, WBC 24.5, hemoglobin 11.1, platelets 233, creatinine 2.4 baseline around 2, AST 375, ALT 301, total bilirubin 0.6, troponin 775, 5148.  Lactic acid 3.3.  Due to elevated troponin and exertional shortness of breath, cardiology was consulted and patient was started on heparin drip.   Review of Systems: Review of Systems  Constitutional:  Negative for chills and fever.  HENT: Negative.    Eyes: Negative.   Respiratory:  Positive for shortness of breath.   Cardiovascular:  Positive for orthopnea.  Gastrointestinal: Negative.   Genitourinary: Negative.   Musculoskeletal: Negative.   Skin: Negative.   Neurological: Negative.   Endo/Heme/Allergies: Negative.   Psychiatric/Behavioral: Negative.       As per HPI otherwise 10 point review of systems negative.   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   Malnutrition of moderate degree 06/28/2023   Mixed hyperlipidemia 07/21/2021   OSA (obstructive sleep apnea) 07/12/2021   NPSG- 03/16/21- AHI 10.3/ hr, desaturation to 87%, body weight 190 lbs     Renal insufficiency 04/17/2015   Syncope 04/17/2015   Tremor 07/21/2021   Vascular dementia (HCC)    Vitamin D deficiency 07/21/2021    Past Surgical History:  Procedure Laterality Date   BACK SURGERY     BIOPSY  10/09/2023   Procedure: BIOPSY;  Surgeon: Kathi Der, MD;  Location: [facility];  Service: Gastroenterology;;   CHOLECYSTECTOMY     COLONOSCOPY WITH PROPOFOL Bilateral 10/09/2023   Procedure: COLONOSCOPY WITH PROPOFOL;  Surgeon: Kathi Der, MD;  Location: [facility];  Service: Gastroenterology;  Laterality: Bilateral;   ESOPHAGOGASTRODUODENOSCOPY (EGD) WITH PROPOFOL Bilateral 10/09/2023   Procedure: ESOPHAGOGASTRODUODENOSCOPY (EGD) WITH PROPOFOL;  Surgeon: Kathi Der, MD;  Location: [facility];  Service: Gastroenterology;  Laterality: Bilateral;   HOT HEMOSTASIS N/A 10/09/2023   Procedure: HOT HEMOSTASIS (ARGON PLASMA COAGULATION/BICAP);  Surgeon: Kathi Der, MD;  Location: [facility];  Service: Gastroenterology;  Laterality: N/A;   KNEE SURGERY Left    SHOULDER SURGERY     for rotator cuff tear     reports that he has never smoked.  He has never used smokeless tobacco. He reports current alcohol use of about 3.0 standard drinks of alcohol per week. He reports current drug use. Drug: Marijuana.  Family History  Problem Relation Age of Onset   Diabetes Mother    Diabetes Father     Prior to Admission medications   Medication Sig Start Date End Date Taking? Authorizing  Provider  albuterol (VENTOLIN HFA) 108 (90 Base) MCG/ACT inhaler Inhale 2 puffs into the lungs every 6 (six) hours as needed for wheezing or shortness of breath. 07/12/21  Yes Young, Joni Fears D, MD  ALPRAZolam Prudy Feeler) 0.5 MG tablet Take 0.5 mg by mouth 2 (two) times daily as needed for anxiety.   Yes [provider]  amLODipine (NORVASC) 10 MG tablet Take 10 mg by mouth at bedtime.   Yes [provider]  Cholecalciferol 1000 UNITS tablet Take 1,000 Units by mouth daily.   Yes [provider]  Cyanocobalamin (VITAMIN B12 PO) Take 1 tablet by mouth daily.   Yes [provider]  docusate sodium (COLACE) 100 MG capsule Take 100 mg by mouth daily as needed for mild constipation.   Yes [provider]  DULERA 200-5 MCG/ACT AERO Inhale 2 puffs into the lungs 2 (two) times daily.   Yes [provider]  escitalopram (LEXAPRO) 5 MG tablet Take 1 tablet (5 mg total) by mouth daily. 06/06/23 01/26/25 Yes Earney Navy, NP  FeFum-FePoly-FA-B Cmp-C-Biot (FOLIVANE-PLUS) CAPS Take 1 capsule by mouth daily.   Yes [provider]  furosemide (LASIX) 40 MG tablet Take 1 tablet (40 mg total) by mouth 2 (two) times daily. 12/21/23  Yes Patwardhan, Manish J, MD  hydrochlorothiazide (HYDRODIURIL) 12.5 MG tablet Take 12.5 mg by mouth daily. 01/17/24  Yes [provider]  ipratropium-albuterol (DUONEB) 0.5-2.5 (3) MG/3ML SOLN Inhale 3 mLs into the lungs every 2 (two) hours as needed.   Yes [provider]  OLANZapine (ZYPREXA) 2.5 MG tablet Take 1 tablet (2.5 mg total) by mouth 2 (two) times daily as needed (agitation). 06/05/23  Yes Linwood Dibbles, MD  loperamide (IMODIUM) 2 MG capsule Take 4 mg by mouth as needed for diarrhea or loose stools. Patient not taking: Reported on 01/27/2024    [provider]  metoprolol succinate (TOPROL-XL) 100 MG 24 hr tablet Take 1 tablet (100 mg total) by mouth daily. Take with or immediately following a  meal. Patient not taking: Reported on 01/27/2024 12/13/23   Elder Negus, MD    Physical Exam: Vitals:   01/27/24 1516 01/27/24 1836 01/27/24 2115 01/27/24 2200  BP:  113/78 108/81 (!) 102/53  Pulse:  (!) 108 (!) 109 (!) 102  Resp:  19 17 14   Temp:  97.8 F (36.6 C)    SpO2:  97% 99% 100%  Weight: 72.6 kg     Height: 6' (1.829 m)      Physical Exam Constitutional:      Appearance: He is normal weight.  HENT:     Head: Normocephalic.     Mouth/Throat:     Mouth: Mucous membranes are moist.  Eyes:     Pupils: Pupils are equal, round, and reactive to light.  Cardiovascular:     Rate and Rhythm: Normal rate and regular rhythm.  Pulmonary:     Effort: Pulmonary effort is normal.     Breath sounds: Normal breath sounds.  Musculoskeletal:        General: Normal range of motion.     Cervical back: Normal  range of motion.  Skin:    General: Skin is warm.     Capillary Refill: Capillary refill takes less than 2 seconds.  Neurological:     Mental Status: He is alert.        Labs on Admission: I have personally reviewed the patients's labs and imaging studies.  Assessment/Plan Principal Problem:   Shortness of breath   # Sepsis secondary to bacterial community acquired pneumonia, POA, active #Acute hypoxic resp failure 2/2 CAP - Patient found to lactic acidosis - Opacities on CT imaging - Leukocytosis  Plan: Continue ceftriaxone and azithromycin Trend lactic acid RT eval and treat P)laced on bipap due to hypercarbia. Repeat ABG showed improvement. Will trial on Maybell and check ABG in morning;   # NSTEMI - Patient found to have troponin 700, 5000 - Exertional shortness of breath without chest pain --possibly related to demand ischemia  Plan: Stat aspirin Start heparin drip Cardiology consulted Trend trop   Start metoprolol NTG PRN though patient denies CP  # Generalized anxiety-continue Xanax  # Hypertension-continue amlodipine,  hydrochlorothiazide  # Depression-continue Lexapro  # Insomnia-continue Zyprexa  # Chronic pain-placed on as needed oxycodone  #Vascular dementia- continue to monitor.    Admission status: Inpatient Progressive  Certification: The appropriate patient status for this patient is INPATIENT. Inpatient status is judged to be reasonable and necessary in order to provide the required intensity of service to ensure the patient's safety. The patient's presenting symptoms, physical exam findings, and initial radiographic and laboratory data in the context of their chronic comorbidities is felt to place them at high risk for further clinical deterioration. Furthermore, it is not anticipated that the patient will be medically stable for discharge from the hospital within 2 midnights of admission.   * I certify that at the point of admission it is my clinical judgment that the patient will require inpatient hospital care spanning beyond 2 midnights from the point of admission due to high intensity of service, high risk for further deterioration and high frequency of surveillance required.Alan Mulder MD [facility] If 7PM-7AM, please contact night-coverage [email or web address]  01/27/2024, 10:29 PM

## 2024-01-27 NOTE — Progress Notes (Signed)
 VASCULAR LAB    Right lower extremity venous duplex has been performed.  See CV proc for preliminary results.  Relayed results to Dr. Anitra Lauth via secure chat  Sherren Kerns, RVT 01/27/2024, 7:24 PM

## 2024-01-27 NOTE — ED Provider Notes (Signed)
 [place] EMERGENCY DEPARTMENT AT [facility] Provider Note   CSN: [id number] Arrival date & time: 01/27/24  1507     History  Chief Complaint  Patient presents with   Shortness of Breath    Jerry Dyal. is a 83 y.o. male.  Pt is an 83 y.o. 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  Patient reports for over a month he has been short of breath but it seems to be getting worse.  He does report a dry cough but denies any fever or productive sputum.  His wife had told EMS that he went in to have a bowel movement today and then was unable to come out of the bathroom.  He was pale and sweaty and when EMS got there he was laying on the floor.  They tried to put his legs up into his chest when he became very short of breath.  He received 2 DuoNebs and route which he reports did help with some of his shortness of breath but when oxygen was removed sats dropped to 82% in the emergency room and he was placed on 5 L.  He denies any chest pain, abdominal pain, nausea or vomiting.  He denies feeling lightheaded at this time.  He does report some swelling in his legs but is unsure how long it has been there.  The history is provided by the patient, the EMS personnel and medical records.  Shortness of Breath      Home Medications Prior to Admission medications   Medication Sig Start Date End Date Taking? Authorizing Provider  albuterol (VENTOLIN HFA) 108 (90 Base) MCG/ACT inhaler Inhale 2 puffs into the lungs every 6 (six) hours as needed for wheezing or shortness of breath. 07/12/21   Waymon Budge, MD  ALPRAZolam Prudy Feeler) 0.5 MG tablet Take 0.5 mg by mouth 2 (two) times daily as needed for anxiety.    [provider]  amLODipine (NORVASC) 10 MG tablet Take 10 mg by mouth at bedtime.    [provider]  aspirin EC 81 MG tablet Take 81 mg by mouth daily. Swallow whole.    [provider]  Cholecalciferol 1000 UNITS tablet Take 1,000 Units by mouth daily.    [provider]  Cyanocobalamin (VITAMIN B12 PO) Take 1 tablet by mouth daily.    [provider]  DULERA 200-5 MCG/ACT AERO Inhale 2 puffs into the lungs 2 (two) times daily.    [provider]  escitalopram (LEXAPRO) 5 MG tablet Take 1 tablet (5 mg total) by mouth daily. 06/06/23 12/13/23  Earney Navy, NP  FeFum-FePoly-FA-B Cmp-C-Biot (FOLIVANE-PLUS) CAPS Take 1 capsule by mouth daily.    [provider]  furosemide (LASIX) 40 MG tablet Take 1 tablet (40 mg total) by mouth 2 (two) times daily. 12/21/23   Patwardhan, Manish J, MD  ipratropium-albuterol (DUONEB) 0.5-2.5 (3) MG/3ML SOLN Inhale 3 mLs into the lungs every 2 (two) hours as needed.    [provider]  loperamide (IMODIUM) 2 MG capsule Take 4 mg by mouth as needed for diarrhea or loose stools.    [provider]  metoprolol succinate (TOPROL-XL) 100 MG 24 hr tablet Take 1 tablet (100 mg total) by mouth daily. Take with  or immediately following a meal. 12/13/23   Patwardhan, Manish J, MD  OLANZapine (ZYPREXA) 2.5 MG tablet Take 1 tablet (2.5 mg total) by mouth 2 (two) times daily as needed (agitation). 06/05/23   Linwood Dibbles, MD      Allergies    Patient has no known allergies.    Review of Systems   Review of Systems  Respiratory:  Positive for shortness of breath.     Physical Exam Updated Vital Signs BP 113/78   Pulse (!) 108   Temp 97.8 F (36.6 C)   Resp 19   Ht 6' (1.829 m)   Wt 72.6 kg   SpO2 97%   BMI 21.70 kg/m  Physical Exam Vitals and nursing note reviewed.  Constitutional:      General: He is not in acute distress.    Appearance: He is well-developed.  HENT:     Head:  Normocephalic and atraumatic.  Eyes:     Conjunctiva/sclera: Conjunctivae normal.     Pupils: Pupils are equal, round, and reactive to light.  Cardiovascular:     Rate and Rhythm: Normal rate and regular rhythm.     Heart sounds: No murmur heard. Pulmonary:     Effort: Pulmonary effort is normal. No respiratory distress.     Breath sounds: Examination of the right-lower field reveals rales. Examination of the left-lower field reveals rales. Rales present. No wheezing.  Abdominal:     General: There is no distension.     Palpations: Abdomen is soft.     Tenderness: There is no abdominal tenderness. There is no guarding or rebound.  Musculoskeletal:        General: No tenderness. Normal range of motion.     Cervical back: Normal range of motion and neck supple.     Right lower leg: Edema present.     Comments: Patient has pitting edema in the right lower extremity 1-2+.  Trace edema in the left lower extremity  Skin:    General: Skin is warm and dry.     Findings: No erythema or rash.  Neurological:     Mental Status: He is alert and oriented to person, place, and time.  Psychiatric:        Behavior: Behavior normal.     ED Results / Procedures / Treatments   Labs (all labs ordered are listed, but only abnormal results are displayed) Labs Reviewed  CBC WITH DIFFERENTIAL/PLATELET - Abnormal; Notable for the following components:      Result Value   WBC 24.5 (*)    Hemoglobin 11.1 (*)    MCV 77.3 (*)    MCH 21.1 (*)    MCHC 27.3 (*)    RDW 21.5 (*)    Neutro Abs 22.1 (*)    Monocytes Absolute 1.5 (*)    All other components within normal limits  COMPREHENSIVE METABOLIC PANEL - Abnormal; Notable for the following components:   Glucose, Bld 140 (*)    BUN 53 (*)    Creatinine, Ser 2.41 (*)    Albumin 3.4 (*)    AST 375 (*)    ALT 301 (*)    GFR, Estimated 26 (*)    All other components within normal limits  D-DIMER, QUANTITATIVE - Abnormal; Notable for the following  components:   D-Dimer, Quant >20.00 (*)    All other components within normal limits  I-STAT VENOUS BLOOD GAS, ED - Abnormal; Notable for the following components:   pH, Ven 7.125 (*)  pCO2, Ven 73.9 (*)    pO2, Ven 48 (*)    Acid-base deficit 7.0 (*)    All other components within normal limits  TROPONIN I (HIGH SENSITIVITY) - Abnormal; Notable for the following components:   Troponin I (High Sensitivity) 775 (*)    All other components within normal limits  RESP PANEL BY RT-PCR (RSV, FLU A&B, COVID)  RVPGX2  CULTURE, BLOOD (ROUTINE X 2)  CULTURE, BLOOD (ROUTINE X 2)  BRAIN NATRIURETIC PEPTIDE  I-STAT CG4 LACTIC ACID, ED  I-STAT CG4 LACTIC ACID, ED  TROPONIN I (HIGH SENSITIVITY)    EKG EKG Interpretation Date/Time:  Sunday January 27 2024 16:39:05 EDT Ventricular Rate:  110 PR Interval:  161 QRS Duration:  94 QT Interval:  319 QTC Calculation: 432 R Axis:   99  Text Interpretation: Sinus tachycardia Probable anterior infarct, age indeterminate No significant change since last tracing Confirmed by Gwyneth Sprout ([id number]) on 01/27/2024 5:30:23 PM  Radiology CT CHEST ABDOMEN PELVIS WO CONTRAST Result Date: 01/27/2024 CLINICAL DATA:  Sepsis EXAM: CT CHEST, ABDOMEN AND PELVIS WITHOUT CONTRAST TECHNIQUE: Multidetector CT imaging of the chest, abdomen and pelvis was performed following the standard protocol without IV contrast. RADIATION DOSE REDUCTION: This exam was performed according to the departmental dose-optimization program which includes automated exposure control, adjustment of the mA and/or kV according to patient size and/or use of iterative reconstruction technique. COMPARISON:  Chest x-ray today.  Chest CT 04/17/2015 FINDINGS: CT CHEST FINDINGS Cardiovascular: Heart is borderline in size. Scattered coronary artery and aortic calcifications. No evidence of aortic aneurysm. Mediastinum/Nodes: No mediastinal, hilar, or axillary adenopathy. Trachea and esophagus are  unremarkable. Thyroid unremarkable. Lungs/Pleura: Mild centrilobular emphysema. Airspace opacities noted in the left upper lobe and left lower lobe most compatible with pneumonia. Nodular area in the left upper lobe measuring up to 1.6 cm likely also related to the infectious process, but follow-up recommended after treatment to ensure resolution. Mild peribronchial thickening. No confluent opacity on the right. No effusions. Musculoskeletal: Chest wall soft tissues are unremarkable. No acute bony abnormality. CT ABDOMEN PELVIS FINDINGS Hepatobiliary: Low-density lesion in the inferior right hepatic lobe measures up to 4.5 cm and is stable since prior study most compatible with cysts. Prior cholecystectomy. No suspicious focal hepatic abnormality or biliary ductal dilatation. Pancreas: No focal abnormality or ductal dilatation. Spleen: No focal abnormality.  Normal size. Adrenals/Urinary Tract: Adrenal glands normal. 5.5 cm lesion in the upper pole of the left kidney is isodense to the kidney. This is slightly larger than prior study, but most likely reflects a hemorrhagic or proteinaceous cyst. Numerous bilateral low-density lesions in the kidneys are most compatible with cysts. No follow-up imaging recommended. No hydronephrosis. Right lower quadrant urinary diversion. Prior cystectomy. Stomach/Bowel: Normal appendix. Stomach, large and small bowel grossly unremarkable. Small supraumbilical ventral hernia contains the anterior wall of the transverse colon. No bowel obstruction. Vascular/Lymphatic: Aortoiliac atherosclerosis. No evidence of aneurysm or adenopathy. Reproductive: No visible focal abnormality. Other: No free fluid or free air. Musculoskeletal: Postoperative and degenerative changes in the lumbar spine. No acute bony abnormality. IMPRESSION: Patchy airspace opacity in the left upper lobe and left lower lobe most compatible with pneumonia. Nodular component in the left upper lobe most likely also  related to pneumonia, but recommend follow-up CT in 3-6 months after treatment to ensure resolution. Mild emphysema. Coronary artery disease, aortic atherosclerosis. No acute findings in the abdomen or pelvis. Electronically Signed   By: Charlett Nose M.D.   On: 01/27/2024 20:14  VAS [place] LOWER EXTREMITY VENOUS (DVT) (7a-7p) Result Date: 01/27/2024  Lower Venous DVT Study Patient Name:  Nayquan Evinger.  Date of Exam:   01/27/2024 Medical Rec #: [id number]          Accession #:    [id number] Date of Birth: 05-Apr-1941           Patient Gender: M Patient Age:   89 years Exam Location:  [facility] Procedure:      VAS [place] LOWER EXTREMITY VENOUS (DVT) Referring Phys: Gwyneth Sprout --------------------------------------------------------------------------------  Indications: Edema, Syncope, and SOB.  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 Comparison Study: No prior study on file Performing Technologist: Sherren Kerns RVS  Examination Guidelines: A complete evaluation includes B-mode imaging, spectral Doppler, color Doppler, and power Doppler as needed of all accessible portions of each vessel. Bilateral testing is considered an integral part of a complete examination. Limited examinations for reoccurring indications may be performed as noted. The reflux portion of the exam is performed with the patient in reverse Trendelenburg.  +---------+---------------+---------+-----------+---------------+-------------+ RIGHT    CompressibilityPhasicitySpontaneityProperties     Thrombus                                                                 Aging         +---------+---------------+---------+-----------+---------------+-------------+ CFV      Full           Yes      Yes        Pulsatile                                                                waveform                      +---------+---------------+---------+-----------+---------------+-------------+ SFJ      Full                                                            +---------+---------------+---------+-----------+---------------+-------------+ FV Prox  Full                                                            +---------+---------------+---------+-----------+---------------+-------------+ FV Mid   Full                                                            +---------+---------------+---------+-----------+---------------+-------------+ FV DistalFull                                                            +---------+---------------+---------+-----------+---------------+-------------+  PFV      Full                                                            +---------+---------------+---------+-----------+---------------+-------------+ POP      Full           Yes      No         Pulsatile                                                                waveform                     +---------+---------------+---------+-----------+---------------+-------------+ PTV      Full                                                            +---------+---------------+---------+-----------+---------------+-------------+ PERO     Full                                                            +---------+---------------+---------+-----------+---------------+-------------+ Gastroc  Full                                                            +---------+---------------+---------+-----------+---------------+-------------+   +----+---------------+---------+-----------+------------------+--------------+ LEFTCompressibilityPhasicitySpontaneityProperties        Thrombus Aging +----+---------------+---------+-----------+------------------+--------------+ CFV Full           Yes      No         Pulsatile waveform                +----+---------------+---------+-----------+------------------+--------------+     Summary: RIGHT: - There is no evidence of deep vein thrombosis in the lower extremity.  - No cystic structure found in the popliteal fossa. Pulsatile waveforms throughout. Interstitial edema noted throughout right lower extremity.  LEFT: - No evidence of common femoral vein obstruction.  Pulsatile waveform.  *See table(s) above for measurements and observations.    Preliminary    DG Chest Port 1 View Result Date: 01/27/2024 CLINICAL DATA:  Near syncope, short of breath EXAM: PORTABLE CHEST 1 VIEW COMPARISON:  06/26/2023 FINDINGS: Single frontal view of the chest demonstrates an enlarged cardiac silhouette. There is patchy left basilar consolidation, increased since prior exam. No effusion or pneumothorax. No acute bony abnormalities. IMPRESSION: 1. Patchy left basilar consolidation, slightly more pronounced than prior study. While this could reflect recurrent aspiration or persistent infection, lack of documented interval clearance may warrant further evaluation with CT. 2. Enlarged cardiac silhouette. Electronically Signed  By: Sharlet Salina M.D.   On: 01/27/2024 16:27    Procedures Procedures    Medications Ordered in ED Medications  cefTRIAXone (ROCEPHIN) 1 g in sodium chloride 0.9 % 100 mL IVPB (has no administration in time range)  azithromycin (ZITHROMAX) 500 mg in sodium chloride 0.9 % 250 mL IVPB (has no administration in time range)  lactated ringers bolus 500 mL (0 mLs Intravenous Paused 01/27/24 1945)    ED Course/ Medical Decision Making/ A&P                                 Medical Decision Making Amount and/or Complexity of Data Reviewed Independent Historian: spouse and EMS External Data Reviewed: notes. Labs: ordered. Decision-making details documented in ED Course. Radiology: ordered and independent interpretation performed. Decision-making details documented in ED Course. ECG/medicine  tests: ordered and independent interpretation performed. Decision-making details documented in ED Course.  Risk Decision regarding hospitalization.   Pt with multiple medical problems and comorbidities and presenting today with a complaint that caries a high risk for morbidity and mortality.  Here today with hypoxia, shortness of breath with a near syncopal event.  Patient is awake and alert at this time however he is tachycardic and tachypneic.  He has no wheezing on exam but does have some bilateral lower lobe rales.  Concern for CHF versus PE.  Lower suspicion for asthma attack at this time.  Lower suspicion for ACS but also concern for possible pneumonia.  Blood pressure is stable at this time.  Patient is currently on 5 L.  Labs and imaging are pending.  8:44 PM I dependently interpreted patient's labs and EKG.  EKG with sinus tachycardia but no other acute changes, VBG with a respiratory acidosis today with a pH of 7.125 and a CO2 of 73, normal BNP, CBC with leukocytosis of 24 today with a stable hemoglobin of 11 and a mild left shift.  Some of the leukocytosis could be due to patient recently starting a prednisone taper.  CMP with new elevated LFTs with an AST of 375, ALT of 301 and a new AKI with creatinine of 2.4 from his most recent one of 1.7.  D-dimer is elevated at greater than 20 and troponin is 775.  Viral panel is negative. I have independently visualized and interpreted pt's images today.  Chest x-ray with concern for patchy infiltrate.  CT was ordered for further evaluation.  Radiology reports that there is a patchy airspace opacity in the left upper lobe and left lower lobe compatible with pneumonia but no other acute findings in the abdomen or pelvis.  DVT scan is negative in the right lower extremity.  Patient was placed on BiPAP given VBG results but sats have been normal on 5 L.  Patient is arousable.  He was given hydration and repeat troponins are pending but suspect a secondary MI  from strain from the pneumonia as patient is not having any chest pain at this time.  Patient was covered with antibiotics.  Patient's wife did report that 1 month ago his Lasix was increased to 40 mg twice a day and when they saw their nephrologist a few weeks ago his renal function was improving.  He does have elevated LFTs of unknown significance may be related to dehydration as he has no abdominal pain or vomiting at this time.  No findings to suggest cholecystitis.  Feel that patient needs admission and further treatment.  Consulted hospitalist for admission.  Patient is a full code.  CRITICAL CARE Performed by: Krishana Lutze Total critical care time: 45 minutes Critical care time was exclusive of separately billable procedures and treating other patients. Critical care was necessary to treat or prevent imminent or life-threatening deterioration. Critical care was time spent personally by me on the following activities: development of treatment plan with patient and/or surrogate as well as nursing, discussions with consultants, evaluation of patient's response to treatment, examination of patient, obtaining history from patient or surrogate, ordering and performing treatments and interventions, ordering and review of laboratory studies, ordering and review of radiographic studies, pulse oximetry and re-evaluation of patient's condition.           Final Clinical Impression(s) / ED Diagnoses Final diagnoses:  Community acquired pneumonia, unspecified laterality  Sepsis with acute renal failure without septic shock, due to unspecified organism, unspecified acute renal failure type (HCC)  Elevated troponin  Acute respiratory failure with hypercapnia [facility])    Rx / DC Orders ED Discharge Orders     None         Gwyneth Sprout, MD 01/27/24 2044

## 2024-01-27 NOTE — Consult Note (Signed)
 Cardiology Consultation   Patient ID: Jerry [name]. MRN: [id number]; DOB: 1941/01/29  Admit date: 01/27/2024 Date of Consult: 01/27/2024  PCP:  [name], MD   [facility] Providers Cardiologist:  Patwardhan     Patient Profile:   Jerry [name]. is a 83 y.o. 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 Avie Arenas.  History of Present Illness:   [name] presented to the [facility] ED c/o worsening DOE and dizziness. He says this has been going on for the past month or so, gradually worsening. O2 sat documented as low as 82% in ED; pt has been requiring 5LNC. HST found to be elevated 775-->5148 Pt was seen by cardiology (Dr. Rosemary Holms) for a new consult visit for his DOE on 12-13-23. 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 He has not yet followed back up with his cardiologist since the TTE was done but has been started on lasix as OP. Workup in the ED, aside from hypoxia and elevated HST, has been remarkable for elevated pCO2 ~74, lactic acidosis, leukocytosis w/ WBC near 25, CT showing multiple areas of likely infection/PNA in the L lung but no edema or effusions, ARF w/ Cr 2.41, hyperkalemia, and sinus tachycardia with HR 110s.  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   Malnutrition of moderate degree 06/28/2023   Mixed hyperlipidemia 07/21/2021   OSA (obstructive sleep apnea) 07/12/2021   NPSG- 03/16/21- AHI 10.3/ hr, desaturation to 87%, body weight 190 lbs     Renal insufficiency 04/17/2015   Syncope 04/17/2015   Tremor 07/21/2021   Vascular dementia (HCC)    Vitamin D deficiency 07/21/2021    Past Surgical History:  Procedure Laterality Date   BACK SURGERY     BIOPSY  10/09/2023   Procedure: BIOPSY;  Surgeon: Kathi Der, MD;  Location: [facility];  Service: Gastroenterology;;   CHOLECYSTECTOMY     COLONOSCOPY WITH PROPOFOL Bilateral 10/09/2023   Procedure: COLONOSCOPY WITH PROPOFOL;  Surgeon: Kathi Der, MD;  Location: [facility];  Service: Gastroenterology;  Laterality: Bilateral;   ESOPHAGOGASTRODUODENOSCOPY (EGD) WITH PROPOFOL Bilateral 10/09/2023   Procedure: ESOPHAGOGASTRODUODENOSCOPY (EGD) WITH PROPOFOL;  Surgeon: Kathi Der, MD;  Location: [facility];  Service: Gastroenterology;  Laterality: Bilateral;   HOT HEMOSTASIS N/A 10/09/2023   Procedure: HOT HEMOSTASIS (ARGON PLASMA COAGULATION/BICAP);  Surgeon: Kathi Der, MD;  Location: [facility];  Service: Gastroenterology;  Laterality: N/A;   KNEE SURGERY Left    SHOULDER SURGERY     for rotator cuff tear     Home Medications:  Prior to Admission medications   Medication Sig Start Date End Date Taking? Authorizing Provider  albuterol (VENTOLIN HFA) 108 (90 Base) MCG/ACT inhaler Inhale 2 puffs into the lungs every 6 (six) hours as  needed for wheezing or shortness of breath. 07/12/21  Yes Young, Joni Fears D, MD  ALPRAZolam Prudy Feeler) 0.5 MG tablet Take 0.5 mg by mouth 2 (two) times daily as needed for anxiety.   Yes [provider]  amLODipine (NORVASC) 10 MG tablet Take 10 mg by mouth at bedtime.   Yes [provider]  Cholecalciferol 1000 UNITS tablet Take 1,000 Units by mouth daily.   Yes [provider]   Cyanocobalamin (VITAMIN B12 PO) Take 1 tablet by mouth daily.   Yes [provider]  docusate sodium (COLACE) 100 MG capsule Take 100 mg by mouth daily as needed for mild constipation.   Yes [provider]  DULERA 200-5 MCG/ACT AERO Inhale 2 puffs into the lungs 2 (two) times daily.   Yes [provider]  escitalopram (LEXAPRO) 5 MG tablet Take 1 tablet (5 mg total) by mouth daily. 06/06/23 01/26/25 Yes Earney Navy, NP  FeFum-FePoly-FA-B Cmp-C-Biot (FOLIVANE-PLUS) CAPS Take 1 capsule by mouth daily.   Yes [provider]  furosemide (LASIX) 40 MG tablet Take 1 tablet (40 mg total) by mouth 2 (two) times daily. 12/21/23  Yes Patwardhan, Manish J, MD  hydrochlorothiazide (HYDRODIURIL) 12.5 MG tablet Take 12.5 mg by mouth daily. 01/17/24  Yes [provider]  ipratropium-albuterol (DUONEB) 0.5-2.5 (3) MG/3ML SOLN Inhale 3 mLs into the lungs every 2 (two) hours as needed.   Yes [provider]  OLANZapine (ZYPREXA) 2.5 MG tablet Take 1 tablet (2.5 mg total) by mouth 2 (two) times daily as needed (agitation). 06/05/23  Yes Linwood Dibbles, MD  loperamide (IMODIUM) 2 MG capsule Take 4 mg by mouth as needed for diarrhea or loose stools. Patient not taking: Reported on 01/27/2024    [provider]  metoprolol succinate (TOPROL-XL) 100 MG 24 hr tablet Take 1 tablet (100 mg total) by mouth daily. Take with or immediately following a meal. Patient not taking: Reported on 01/27/2024 12/13/23   Elder Negus, MD    Inpatient Medications: Scheduled Meds:  amLODipine  10 mg Oral QHS   aspirin EC  325 mg Oral Once   enoxaparin (LOVENOX) injection  40 mg Subcutaneous Q24H   [START ON 01/28/2024] escitalopram  5 mg Oral Daily   [START ON 01/28/2024] hydrochlorothiazide  12.5 mg Oral Daily   [START ON 01/28/2024] metoprolol succinate  25 mg Oral Daily   Continuous Infusions:  azithromycin 500 mg (01/27/24 2157)   PRN Meds: acetaminophen **OR**  acetaminophen, ALPRAZolam, nitroGLYCERIN, OLANZapine, ondansetron **OR** ondansetron (ZOFRAN) IV, oxyCODONE  Allergies:   No Known Allergies  Social History:   Social History   Socioeconomic History   Marital status: Married    Spouse name: Not on file   Number of children: Not on file   Years of education: Not on file   Highest education level: Not on file  Occupational History   Not on file  Tobacco Use   Smoking status: Never   Smokeless tobacco: Never  Vaping Use   Vaping status: Never Used  Substance and Sexual Activity   Alcohol use: Yes    Alcohol/week: 3.0 standard drinks of alcohol    Types: 3 Standard drinks or equivalent per week   Drug use: Yes    Types: Marijuana   Sexual activity: Not on file  Other Topics Concern   Not on file  Social History Narrative   Not on file   Social Drivers of Health   Financial Resource Strain: Not on file  Food  Insecurity: No Food Insecurity (06/26/2023)   Hunger Vital Sign    Worried About Running Out of Food in the Last Year: Never true    Ran Out of Food in the Last Year: Never true  Transportation Needs: No Transportation Needs (06/26/2023)   PRAPARE - Administrator, Civil Service (Medical): No    Lack of Transportation (Non-Medical): No  Physical Activity: Not on file  Stress: Not on file  Social Connections: Unknown (12/20/2022)   Received from [facility]   Social Network    Social Network: Not on file  Intimate Partner Violence: Unknown (12/20/2022)   Received from [facility], [facility]   HITS    Physically Hurt: Not on file    Insult or Talk Down To: Not on file    Threaten Physical Harm: Not on file    Scream or Curse: Not on file    Family History:    Family History  Problem Relation Age of Onset   Diabetes Mother    Diabetes Father      ROS:  Please see the history of present illness.   All other ROS reviewed and negative.     Physical Exam/Data:   Vitals:    01/27/24 1516 01/27/24 1836 01/27/24 2115 01/27/24 2200  BP:  113/78 108/81 (!) 102/53  Pulse:  (!) 108 (!) 109 (!) 102  Resp:  19 17 14   Temp:  97.8 F (36.6 C)    SpO2:  97% 99% 100%  Weight: 72.6 kg     Height: 6' (1.829 m)      No intake or output data in the 24 hours ending 01/27/24 2245    01/27/2024    3:16 PM 12/22/2023    5:10 AM 12/13/2023    2:35 PM  Last 3 Weights  Weight (lbs) 160 lb 160 lb 175 lb 3.2 oz  Weight (kg) 72.576 kg 72.576 kg 79.47 kg     Body mass index is 21.7 kg/m.  General:  Well nourished, well developed, in no acute distress. On Bipap HEENT: normal Neck: no JVD Vascular: No carotid bruits; Distal pulses 2+ bilaterally Cardiac:  normal S1, S2, tachy; RRR; no murmur  Lungs:  tachypneic Abd: soft, nontender, no hepatomegaly  Ext: no edema Musculoskeletal:  No deformities Skin: warm and dry  Neuro:  no focal abnormalities noted   EKG:  The EKG was personally reviewed and demonstrates:  ST with poor RWP, HR 110 (no acute ischemic changes) Telemetry:  Telemetry was personally reviewed and demonstrates:  sinus tachycardia  Relevant CV Studies: TTE 01-07-24 1. Right ventricular systolic function is mildly reduced. The right  ventricular size is normal. There is severely elevated pulmonary artery  systolic pressure. The estimated right ventricular systolic pressure is  63.2 mmHg.   2. Left ventricular ejection fraction, by estimation, is 55 to 60%. Left  ventricular ejection fraction by 3D volume is 57 %. The left ventricle has  normal function. The left ventricle has no regional wall motion  abnormalities. There is moderate left  ventricular hypertrophy. Left ventricular diastolic parameters are  indeterminate. There is the interventricular septum is flattened in  systole and diastole, consistent with right ventricular pressure and  volume overload.   3. The mitral valve is normal in structure. Trivial mitral valve  regurgitation.   4. The aortic  valve is tricuspid. Aortic valve regurgitation is not  visualized. No aortic stenosis is present.   5. The inferior vena cava is  normal in size with greater than 50%  respiratory variability, suggesting right atrial pressure of 3 mmHg.   Laboratory Data:  High Sensitivity Troponin:   Recent Labs  Lab 01/27/24 1613 01/27/24 2054  TROPONINIHS 775* 5,148*     Chemistry Recent Labs  Lab 01/27/24 1613 01/27/24 1633  NA 145 145  K 5.1 5.0  CL 109  --   CO2 22  --   GLUCOSE 140*  --   BUN 53*  --   CREATININE 2.41*  --   CALCIUM 9.3  --   GFRNONAA 26*  --   ANIONGAP 14  --     Recent Labs  Lab 01/27/24 1613  PROT 6.5  ALBUMIN 3.4*  AST 375*  ALT 301*  ALKPHOS 98  BILITOT 0.6   Lipids No results for input(s): "CHOL", "TRIG", "HDL", "LABVLDL", "LDLCALC", "CHOLHDL" in the last 168 hours.  Hematology Recent Labs  Lab 01/27/24 1613 01/27/24 1633  WBC 24.5*  --   RBC 5.25  --   HGB 11.1* 14.3  HCT 40.6 42.0  MCV 77.3*  --   MCH 21.1*  --   MCHC 27.3*  --   RDW 21.5*  --   PLT 233  --    Thyroid No results for input(s): "TSH", "FREET4" in the last 168 hours.  BNP Recent Labs  Lab 01/27/24 1613  BNP 36.8    DDimer  Recent Labs  Lab 01/27/24 1613  DDIMER >20.00*     Radiology/Studies:  CT CHEST ABDOMEN PELVIS WO CONTRAST Result Date: 01/27/2024 CLINICAL DATA:  Sepsis EXAM: CT CHEST, ABDOMEN AND PELVIS WITHOUT CONTRAST TECHNIQUE: Multidetector CT imaging of the chest, abdomen and pelvis was performed following the standard protocol without IV contrast. RADIATION DOSE REDUCTION: This exam was performed according to the departmental dose-optimization program which includes automated exposure control, adjustment of the mA and/or kV according to patient size and/or use of iterative reconstruction technique. COMPARISON:  Chest x-ray today.  Chest CT 04/17/2015 FINDINGS: CT CHEST FINDINGS Cardiovascular: Heart is borderline in size. Scattered coronary artery and  aortic calcifications. No evidence of aortic aneurysm. Mediastinum/Nodes: No mediastinal, hilar, or axillary adenopathy. Trachea and esophagus are unremarkable. Thyroid unremarkable. Lungs/Pleura: Mild centrilobular emphysema. Airspace opacities noted in the left upper lobe and left lower lobe most compatible with pneumonia. Nodular area in the left upper lobe measuring up to 1.6 cm likely also related to the infectious process, but follow-up recommended after treatment to ensure resolution. Mild peribronchial thickening. No confluent opacity on the right. No effusions. Musculoskeletal: Chest wall soft tissues are unremarkable. No acute bony abnormality. CT ABDOMEN PELVIS FINDINGS Hepatobiliary: Low-density lesion in the inferior right hepatic lobe measures up to 4.5 cm and is stable since prior study most compatible with cysts. Prior cholecystectomy. No suspicious focal hepatic abnormality or biliary ductal dilatation. Pancreas: No focal abnormality or ductal dilatation. Spleen: No focal abnormality.  Normal size. Adrenals/Urinary Tract: Adrenal glands normal. 5.5 cm lesion in the upper pole of the left kidney is isodense to the kidney. This is slightly larger than prior study, but most likely reflects a hemorrhagic or proteinaceous cyst. Numerous bilateral low-density lesions in the kidneys are most compatible with cysts. No follow-up imaging recommended. No hydronephrosis. Right lower quadrant urinary diversion. Prior cystectomy. Stomach/Bowel: Normal appendix. Stomach, large and small bowel grossly unremarkable. Small supraumbilical ventral hernia contains the anterior wall of the transverse colon. No bowel obstruction. Vascular/Lymphatic: Aortoiliac atherosclerosis. No evidence of aneurysm or adenopathy. Reproductive: No visible focal abnormality.  Other: No free fluid or free air. Musculoskeletal: Postoperative and degenerative changes in the lumbar spine. No acute bony abnormality. IMPRESSION: Patchy airspace  opacity in the left upper lobe and left lower lobe most compatible with pneumonia. Nodular component in the left upper lobe most likely also related to pneumonia, but recommend follow-up CT in 3-6 months after treatment to ensure resolution. Mild emphysema. Coronary artery disease, aortic atherosclerosis. No acute findings in the abdomen or pelvis. Electronically Signed   By: Charlett Nose M.D.   On: 01/27/2024 20:14   VAS [place] LOWER EXTREMITY VENOUS (DVT) (7a-7p) Result Date: 01/27/2024  Lower Venous DVT Study Patient Name:  Xaidyn Kepner.  Date of Exam:   01/27/2024 Medical Rec #: [id number]          Accession #:    [id number] Date of Birth: July 14, 1941           Patient Gender: M Patient Age:   25 years Exam Location:  [facility] Procedure:      VAS [place] LOWER EXTREMITY VENOUS (DVT) Referring Phys: Gwyneth Sprout --------------------------------------------------------------------------------  Indications: Edema, Syncope, and SOB.  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 Comparison Study: No prior study on file Performing Technologist: Sherren Kerns RVS  Examination Guidelines: A complete evaluation includes B-mode imaging, spectral Doppler, color Doppler, and power Doppler as needed of all accessible portions of each vessel. Bilateral testing is considered an integral part of a complete examination. Limited examinations for reoccurring indications may be performed as noted. The reflux portion of the exam is performed with the patient in reverse Trendelenburg.  +---------+---------------+---------+-----------+---------------+-------------+ RIGHT    CompressibilityPhasicitySpontaneityProperties     Thrombus                                                                 Aging         +---------+---------------+---------+-----------+---------------+-------------+ CFV      Full           Yes      Yes         Pulsatile                                                                waveform                     +---------+---------------+---------+-----------+---------------+-------------+ SFJ      Full                                                            +---------+---------------+---------+-----------+---------------+-------------+ FV Prox  Full                                                            +---------+---------------+---------+-----------+---------------+-------------+  FV Mid   Full                                                            +---------+---------------+---------+-----------+---------------+-------------+ FV DistalFull                                                            +---------+---------------+---------+-----------+---------------+-------------+ PFV      Full                                                            +---------+---------------+---------+-----------+---------------+-------------+ POP      Full           Yes      No         Pulsatile                                                                waveform                     +---------+---------------+---------+-----------+---------------+-------------+ PTV      Full                                                            +---------+---------------+---------+-----------+---------------+-------------+ PERO     Full                                                            +---------+---------------+---------+-----------+---------------+-------------+ Gastroc  Full                                                            +---------+---------------+---------+-----------+---------------+-------------+   +----+---------------+---------+-----------+------------------+--------------+ LEFTCompressibilityPhasicitySpontaneityProperties        Thrombus Aging  +----+---------------+---------+-----------+------------------+--------------+ CFV Full           Yes      No         Pulsatile waveform               +----+---------------+---------+-----------+------------------+--------------+     Summary: RIGHT: - There is no evidence of deep vein thrombosis in the lower extremity.  - No cystic structure found in the popliteal fossa. Pulsatile waveforms throughout. Interstitial  edema noted throughout right lower extremity.  LEFT: - No evidence of common femoral vein obstruction.  Pulsatile waveform.  *See table(s) above for measurements and observations.    Preliminary    DG Chest Port 1 View Result Date: 01/27/2024 CLINICAL DATA:  Near syncope, short of breath EXAM: PORTABLE CHEST 1 VIEW COMPARISON:  06/26/2023 FINDINGS: Single frontal view of the chest demonstrates an enlarged cardiac silhouette. There is patchy left basilar consolidation, increased since prior exam. No effusion or pneumothorax. No acute bony abnormalities. IMPRESSION: 1. Patchy left basilar consolidation, slightly more pronounced than prior study. While this could reflect recurrent aspiration or persistent infection, lack of documented interval clearance may warrant further evaluation with CT. 2. Enlarged cardiac silhouette. Electronically Signed   By: Sharlet Salina M.D.   On: 01/27/2024 16:27     Assessment and Plan:   Elevated HST/type II NSTEMI in setting of acute illness: pt is acutely ill w/ PNA/COPD exacerbation, AKI and sepsis. HST elevated at least in part due to increased demand (type II), with possibly some component of underlying CAD. Recent TTE showed normal LVEF but mild R dysfunction c/w chronic/longstanding lung disease and pulm HTN. Would treat the acute illness; once pt is further stabilized, can ultimately consider LHC for further evaluation.  2. HTN/CKD/HLD and other non-cardiac problems: mgmt as per primary medical team  Risk Assessment/Risk Scores:     TIMI Risk  Score for Unstable Angina or Non-ST Elevation MI:   The patient's TIMI risk score is 3, which indicates a 13% risk of all cause mortality, new or recurrent myocardial infarction or need for urgent revascularization in the next 14 days.          For questions or updates, please contact [facility] Please consult [email or web address] for contact info under    Signed, Precious Reel, MD, [facility]  01/27/2024 10:45 PM

## 2024-01-27 NOTE — Progress Notes (Signed)
   01/27/24 2112  BiPAP/CPAP/SIPAP  $ Non-Invasive Ventilator  Non-Invasive Vent Initial  $ Face Mask Small Yes  BiPAP/CPAP/SIPAP Pt Type Adult  BiPAP/CPAP/SIPAP SERVO  Mask Type Full face mask  Mask Size Small  Set Rate 15 breaths/min  Respiratory Rate 17 breaths/min  IPAP 14 cmH20  EPAP 5 cmH2O  PEEP 5 cmH20  FiO2 (%) 40 %  Minute Ventilation 14.7  Leak 17  Peak Inspiratory Pressure (PIP) 14  Tidal Volume (Vt) 686  Patient Home Equipment No  Auto Titrate No  BiPAP/CPAP /SiPAP Vitals  Bilateral Breath Sounds Clear;Diminished

## 2024-01-27 NOTE — ED Notes (Signed)
 Attempted to draw repeat lactic, troponin and blood cultures unsuccessfully x 2. Phlebotomy informed of need for assistance.

## 2024-01-27 NOTE — ED Notes (Signed)
 Patient transported to CT

## 2024-01-27 NOTE — Progress Notes (Signed)
 PHARMACY - ANTICOAGULATION CONSULT NOTE  Pharmacy Consult for IV heparin Indication: chest pain/ACS  No Known Allergies  Patient Measurements: Height: 6' (182.9 cm) Weight: 72.6 kg (160 lb) IBW/kg (Calculated) : 77.6 Heparin Dosing Weight: 72.6 kg  Vital Signs: Temp: 97.8 F (36.6 C) (03/16 1836) BP: 102/53 (03/16 2200) Pulse Rate: 102 (03/16 2200)  Labs: Recent Labs    01/27/24 1613 01/27/24 1633 01/27/24 2054  HGB 11.1* 14.3  --   HCT 40.6 42.0  --   PLT 233  --   --   CREATININE 2.41*  --   --   TROPONINIHS 775*  --  5,148*    Estimated Creatinine Clearance: 25.1 mL/min (A) (by C-G formula based on SCr of 2.41 mg/dL (H)).   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 is a 83 y.o. year old male admitted on 01/27/2024 with concern for ACS. Trop 775 > 5148. No PE noted on CTA.  No anticoagulation prior to admission. Pharmacy consulted to dose heparin.  Goal of Therapy:  Heparin level 0.3-0.7 units/ml Monitor platelets by anticoagulation protocol: Yes   Plan:  Heparin 4000 units x 1 as bolus followed by heparin infusion  at 850 units/hr 8 heparin level  Daily heparin level, CBC, and monitoring for bleeding F/u plans for anticoagulation and cards recs  Thank you for allowing pharmacy to participate in this patient's care.  Marja Kays, PharmD Emergency Medicine Clinical Pharmacist 01/27/2024,10:45 PM

## 2024-01-28 DIAGNOSIS — R0602 Shortness of breath: Secondary | ICD-10-CM | POA: Diagnosis not present

## 2024-01-28 LAB — I-STAT ARTERIAL BLOOD GAS, ED
Acid-base deficit: 3 mmol/L — ABNORMAL HIGH (ref 0.0–2.0)
Acid-base deficit: 2 mmol/L (ref 0.0–2.0)
Bicarbonate: 23.4 mmol/L (ref 20.0–28.0)
Bicarbonate: 24.7 mmol/L (ref 20.0–28.0)
Calcium, Ion: 1.29 mmol/L (ref 1.15–1.40)
Calcium, Ion: 1.32 mmol/L (ref 1.15–1.40)
HCT: 35 % — ABNORMAL LOW (ref 39.0–52.0)
HCT: 34 % — ABNORMAL LOW (ref 39.0–52.0)
Hemoglobin: 11.9 g/dL — ABNORMAL LOW (ref 13.0–17.0)
Hemoglobin: 11.6 g/dL — ABNORMAL LOW (ref 13.0–17.0)
O2 Saturation: 88 %
O2 Saturation: 93 %
Patient temperature: 97.8
Patient temperature: 38.6
Potassium: 5.5 mmol/L — ABNORMAL HIGH (ref 3.5–5.1)
Potassium: 5.2 mmol/L — ABNORMAL HIGH (ref 3.5–5.1)
Sodium: 141 mmol/L (ref 135–145)
Sodium: 141 mmol/L (ref 135–145)
TCO2: 25 mmol/L (ref 22–32)
TCO2: 26 mmol/L (ref 22–32)
pCO2 arterial: 44.1 mmHg (ref 32–48)
pCO2 arterial: 52.5 mmHg — ABNORMAL HIGH (ref 32–48)
pH, Arterial: 7.331 — ABNORMAL LOW (ref 7.35–7.45)
pH, Arterial: 7.289 — ABNORMAL LOW (ref 7.35–7.45)
pO2, Arterial: 58 mmHg — ABNORMAL LOW (ref 83–108)
pO2, Arterial: 82 mmHg — ABNORMAL LOW (ref 83–108)

## 2024-01-28 LAB — COMPREHENSIVE METABOLIC PANEL
ALT: 196 U/L — ABNORMAL HIGH (ref 0–44)
AST: 166 U/L — ABNORMAL HIGH (ref 15–41)
Albumin: 2.5 g/dL — ABNORMAL LOW (ref 3.5–5.0)
Alkaline Phosphatase: 62 U/L (ref 38–126)
Anion gap: 9 (ref 5–15)
BUN: 47 mg/dL — ABNORMAL HIGH (ref 8–23)
CO2: 19 mmol/L — ABNORMAL LOW (ref 22–32)
Calcium: 7.2 mg/dL — ABNORMAL LOW (ref 8.9–10.3)
Chloride: 114 mmol/L — ABNORMAL HIGH (ref 98–111)
Creatinine, Ser: 1.92 mg/dL — ABNORMAL HIGH (ref 0.61–1.24)
GFR, Estimated: 35 mL/min — ABNORMAL LOW (ref >60)
Glucose, Bld: 105 mg/dL — ABNORMAL HIGH (ref 70–99)
Potassium: 5.4 mmol/L — ABNORMAL HIGH (ref 3.5–5.1)
Sodium: 142 mmol/L (ref 135–145)
Total Bilirubin: 1 mg/dL (ref 0.0–1.2)
Total Protein: 4.8 g/dL — ABNORMAL LOW (ref 6.5–8.1)

## 2024-01-28 LAB — TROPONIN I (HIGH SENSITIVITY)
Troponin I (High Sensitivity): 5348 ng/L (ref <18)
Troponin I (High Sensitivity): 6425 ng/L (ref <18)
Troponin I (High Sensitivity): 5851 ng/L (ref <18)
Troponin I (High Sensitivity): 3410 ng/L (ref <18)

## 2024-01-28 LAB — CBC
HCT: 37.6 % — ABNORMAL LOW (ref 39.0–52.0)
Hemoglobin: 10.6 g/dL — ABNORMAL LOW (ref 13.0–17.0)
MCH: 21.1 pg — ABNORMAL LOW (ref 26.0–34.0)
MCHC: 28.2 g/dL — ABNORMAL LOW (ref 30.0–36.0)
MCV: 74.8 fL — ABNORMAL LOW (ref 80.0–100.0)
Platelets: 210 10*3/uL (ref 150–400)
RBC: 5.03 MIL/uL (ref 4.22–5.81)
RDW: 21.1 % — ABNORMAL HIGH (ref 11.5–15.5)
WBC: 22.4 10*3/uL — ABNORMAL HIGH (ref 4.0–10.5)
nRBC: 0.1 % (ref 0.0–0.2)

## 2024-01-28 LAB — LACTIC ACID, PLASMA
Lactic Acid, Venous: 2.2 mmol/L (ref 0.5–1.9)
Lactic Acid, Venous: 3.3 mmol/L (ref 0.5–1.9)
Lactic Acid, Venous: 0.9 mmol/L (ref 0.5–1.9)
Lactic Acid, Venous: 1.9 mmol/L (ref 0.5–1.9)

## 2024-01-28 LAB — HEPARIN LEVEL (UNFRACTIONATED)
Heparin Unfractionated: <0.1 [IU]/mL — ABNORMAL LOW (ref 0.30–0.70)
Heparin Unfractionated: 0.15 [IU]/mL — ABNORMAL LOW (ref 0.30–0.70)

## 2024-01-28 MED ORDER — HEPARIN BOLUS VIA INFUSION
2100.0000 [IU] | Freq: Once | INTRAVENOUS | Status: AC
  Administered 2024-01-28: 2100 [IU] via INTRAVENOUS
  Filled 2024-01-28: qty 2100

## 2024-01-28 MED ORDER — MOMETASONE FURO-FORMOTEROL FUM 200-5 MCG/ACT IN AERO
2.0000 | INHALATION_SPRAY | Freq: Two times a day (BID) | RESPIRATORY_TRACT | Status: DC
  Administered 2024-01-28 – 2024-02-03 (×10): 2 via RESPIRATORY_TRACT
  Filled 2024-01-28 (×2): qty 8.8

## 2024-01-28 MED ORDER — LEVALBUTEROL HCL 0.63 MG/3ML IN NEBU
0.6300 mg | INHALATION_SOLUTION | Freq: Once | RESPIRATORY_TRACT | Status: AC | PRN
Start: 2024-01-28 — End: 2024-01-28
  Administered 2024-01-28: 0.63 mg via RESPIRATORY_TRACT
  Filled 2024-01-28: qty 3

## 2024-01-28 MED ORDER — ORAL CARE MOUTH RINSE: 15.0000 mL | OROMUCOSAL | Status: DC | PRN

## 2024-01-28 MED ORDER — SODIUM CHLORIDE 0.9 % IV SOLN: INTRAVENOUS | Status: DC

## 2024-01-28 NOTE — Progress Notes (Signed)
 Patient is off the BIPAP at this time per MD's request and placed on 3L [place], tolerating well, RN made aware.

## 2024-01-28 NOTE — Progress Notes (Addendum)
 Patient Name: Jerry [name]. Date of Encounter: 01/28/2024 [facility] Cardiologist: Elder Negus, MD   Interval Summary  .    Patient resting in the room, only briefly wakes up to answer my questions  He is currently on BiPAP but denies any chest pain, states he feels okay just tired EKG today compared to yesterday appears relatively unchanged   Vital Signs .    Vitals:   01/28/24 0800 01/28/24 0835 01/28/24 0900 01/28/24 0915  BP: 132/80 (!) 148/84 (!) 144/87 138/80  Pulse: 89 (!) 109 94 94  Resp: 17 18 18 19   Temp:  97.9 F (36.6 C)    TempSrc:  Axillary    SpO2: 99% 100% 100% 100%  Weight:      Height:       No intake or output data in the 24 hours ending 01/28/24 0943    01/27/2024    3:16 PM 12/22/2023    5:10 AM 12/13/2023    2:35 PM  Last 3 Weights  Weight (lbs) 160 lb 160 lb 175 lb 3.2 oz  Weight (kg) 72.576 kg 72.576 kg 79.47 kg     Telemetry/ECG    Sinus rhythm HR 80-90s, PVCs - Personally Reviewed  Physical Exam .   GEN: No acute distress, resting on BiPAP.   Neck: No JVD Cardiac: RRR, no murmurs, rubs, or gallops.  Respiratory: normal effort, on BiPAP. GI: Soft, nontender, non-distended  MS: No edema  Assessment & Plan .   Jerry [name]. 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   NSTEMI  Coronary calcifications noted on CT Patient reported DOE and dizziness, no chest pain Was hypoxic in the ED, requiring BiPAP now SpO2 at 100% Troponin 775 > 5,148 > 5,348 > 6,425 -- continue to trend  Echo from 12/2022: EF 55-60%, moderate LVH, mild RV dysfunction, flattened intraventricular septum, trivial MR Continue IV heparin  Continue ASA 81 mg daily  Continue Lopressor 25 mg BID  Consider LHC for further evaluation once patient has recovered from acute illness   Per primary Sepsis and  acute hypoxic failure 2/2 CAP CKD stage 3b Anxiety Depression Insomnia Chronic pain Vascular dementia  Hypertension  For questions or updates, please contact [facility] Please consult [email or web address] for contact info under      Signed, Olena Leatherwood, PA-C    ATTENDING ATTESTATION:  After conducting a review of all available clinical information with the care team, interviewing the patient, and performing a physical exam, I agree with the findings and plan described in this note.   GEN: No acute distress.   HEENT:  MMM, no JVD, no scleral icterus Cardiac: RRR, no murmurs, rubs, or gallops.  Respiratory: Clear to auscultation bilaterally. GI: Soft, nontender, non-distended  MS: No edema; No deformity. Neuro:  Nonfocal  Vasc:  +2 radial pulses  Patient feels improved with ongoing treatment of PNA.  On further query, the patient denies any exertional angina at home.  My sense is this is likely due to a Type II MI due to underlying infection.  TTE from February shows RSVP 63 with RV dilatation and mild dysfunction.  Cr back to baseline and lactic acid has cleared.  Will proceed with cor angio + RHC tomorrow to evaluate coronary status and assess/characterize pulmonary pressures as long as Cr is stable.  I discussed with patient and wife (by phone) who both agree with plan.  I have reviewed the risks, indications, and alternatives to cardiac catheterization, possible angioplasty, and stenting with the patient. Risks include but are not limited to bleeding, infection, vascular injury, stroke, myocardial infection, arrhythmia, kidney injury, radiation-related injury in the case of prolonged fluoroscopy use, emergency cardiac surgery, and death. The patient understands the risks of serious complication is 1-2 in 1000 with diagnostic cardiac cath and 1-2% or less with angioplasty/stenting.    Alverda Skeans, MD Pager [phone]

## 2024-01-28 NOTE — Progress Notes (Signed)
 PROGRESS NOTE  Jerry [name]. BJY:[id number] DOB: 29-Jan-1941 DOA: 01/27/2024 PCP: [name], MD   LOS: 1 day   Brief narrative:  Jerry [name]. is a 83 y.o. 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  Patient had been having progressive shortness of breath for weeks associated with cough, difficulty standing due to fatigue and weakness.  EMS was called in.  Patient was noted to be hypoxic and was brought into the ED.  Patient was noted to be febrile but hemodynamically stable.  Initial labs showed WBC elevated at 24.5.  Creatinine was 2.4 from baseline around 2.  AST and ALT elevated with normal total bilirubin.  Troponin was elevated at [phone].  Lactic acid elevated at 3.3.  Chest x-ray done showed patchy left basilar consolidation.  CT chest showed patchy airspace opacity in the left upper and lower lobe compatible with pneumonia.  Cardiology was consulted from the ED, was started on heparin drip and was admitted to hospital for further evaluation and treatment.    Assessment/Plan: Principal Problem:   Shortness of breath Active Problems:   OSA (obstructive sleep apnea)   Asthma, mild intermittent   Benign essential hypertension   Mixed hyperlipidemia  Sepsis secondary to pneumonia Acute hypoxic and hypercarbic resp failure secondary to left-sided community-acquired pneumonia. History of asthma. Patient was febrile tachypneic hypoxic on presentation with leukocytosis and elevated creatinine on presentation with chest infiltrates suggestive of sepsis.  CT scan of the chest also showed pulmonary infiltrates.  Will continue to monitor closely.  On Rocephin and Zithromax.  Lactate has normalized at this time.  Currently on BiPAP, though he was transiently on nasal cannula.  ABG with pCO2 52 at this  time.  Blood cultures negative in less than 12 hours.  Significant leukocytosis on presentation.  Will wean off BiPAP if possible.  NSTEMI - Patient found to have troponin 700, 5000 patient with exertional shortness of breath without any chest pain.  Likely secondary to demand ischemia from pneumonia, COPD exacerbation, AKI and sepsis.  Continue aspirin heparin drip cardiology has been consulted.  Continue metoprolol.  Duplex ultrasound of the lower extremity without any DVT.  2D echocardiogram with LV ejection fraction of 55 to 60%.  Follow cardiology recommendations.  EKG unchanged from yesterday.  Plan for coronary angiogram likely tomorrow as per cardiology.  Elevated LFTs.  Will continue to monitor.  Trending down compared to on admission.  Could be secondary to sepsis.  Will continue to monitor.   Generalized anxiety disorder-continue Xanax   Essential hypertension-continue amlodipine, hydrochlorothiazide    Depression-continue Lexapro   Insomnia-continue Zyprexa   Chronic pain-placed on as needed oxycodone   Vascular dementia- continue to monitor.  Delirium precautions  DVT prophylaxis: SCDs Start: 01/27/24 2226   Disposition: Uncertain at this time, will get PT OT eval  Status is: Inpatient Remains inpatient appropriate because: Pending clinical improvement, on BiPAP, sepsis, pneumonia, IV antibiotic, possible need for cardiac catheterization    Code Status:     Code Status: Full Code  Family Communication: None at bedside  Consultants: Cardiology  Procedures: BiPAP  Anti-infectives:  Rocephin and Zithromax IV  Anti-infectives (From admission, onward)    Start     Dose/Rate Route Frequency Ordered Stop   01/28/24 2200  cefTRIAXone (ROCEPHIN) 2 g in sodium chloride 0.9 % 100 mL IVPB  2 g 200 mL/hr over 30 Minutes Intravenous Every 24 hours 01/27/24 2245     01/27/24 2300  azithromycin (ZITHROMAX) tablet 500 mg        500 mg Oral Daily at bedtime 01/27/24  2245     01/27/24 1745  cefTRIAXone (ROCEPHIN) 1 g in sodium chloride 0.9 % 100 mL IVPB        1 g 200 mL/hr over 30 Minutes Intravenous  Once 01/27/24 1732 01/27/24 2154   01/27/24 1745  azithromycin (ZITHROMAX) 500 mg in sodium chloride 0.9 % 250 mL IVPB        500 mg 250 mL/hr over 60 Minutes Intravenous  Once 01/27/24 1732 01/27/24 2317      Subjective: Today, patient was seen and examined at bedside.  Patient on BiPAP and wishes to drink something.  Denies any pain, nausea or vomiting.  Has some shortness of breath.  Nursing staff reported increased work of breathing and elevated pCO2 initiating BiPAP.  Denies chest pain.  Objective: Vitals:   01/28/24 1115 01/28/24 1232  BP: (!) 146/72 133/81  Pulse: (!) 107 92  Resp: (!) 25 20  Temp:    SpO2: 100% 95%   No intake or output data in the 24 hours ending 01/28/24 1258 Filed Weights   01/27/24 1516  Weight: 72.6 kg   Body mass index is 21.7 kg/m.   Physical Exam: GENERAL: Patient is alert awake and oriented, in moderate respiratory distress, on BiPAP, HENT: No scleral pallor or icterus. Pupils equally reactive to light. Oral mucosa is moist NECK: is supple, no gross swelling noted. CHEST:   Diminished breath sounds bilaterally. CVS: S1 and S2 heard, no murmur. Regular rate and rhythm.  ABDOMEN: Soft, non-tender, bowel sounds are present. EXTREMITIES: No edema. CNS: Cranial nerves are intact. No focal motor deficits. SKIN: warm and dry without rashes.  Data Review: I have personally reviewed the following laboratory data and studies,  CBC: Recent Labs  Lab 01/27/24 1613 01/27/24 1633 01/27/24 2248 01/28/24 0148 01/28/24 0432 01/28/24 0719  WBC 24.5*  --   --   --  22.4*  --   NEUTROABS 22.1*  --   --   --   --   --   HGB 11.1* 14.3 12.2* 11.9* 10.6* 11.6*  HCT 40.6 42.0 36.0* 35.0* 37.6* 34.0*  MCV 77.3*  --   --   --  74.8*  --   PLT 233  --   --   --  210  --    Basic Metabolic Panel: Recent Labs  Lab  01/27/24 1613 01/27/24 1633 01/27/24 2248 01/28/24 0148 01/28/24 0158 01/28/24 0719  NA 145 145 143 141 142 141  K 5.1 5.0 5.5* 5.5* 5.4* 5.2*  CL 109  --   --   --  114*  --   CO2 22  --   --   --  19*  --   GLUCOSE 140*  --   --   --  105*  --   BUN 53*  --   --   --  47*  --   CREATININE 2.41*  --   --   --  1.92*  --   CALCIUM 9.3  --   --   --  7.2*  --    Liver Function Tests: Recent Labs  Lab 01/27/24 1613 01/28/24 0158  AST 375* 166*  ALT 301* 196*  ALKPHOS 98 62  BILITOT 0.6 1.0  PROT 6.5 4.8*  ALBUMIN 3.4* 2.5*   No results for input(s): "LIPASE", "AMYLASE" in the last 168 hours. No results for input(s): "AMMONIA" in the last 168 hours. Cardiac Enzymes: No results for input(s): "CKTOTAL", "CKMB", "CKMBINDEX", "TROPONINI" in the last 168 hours. BNP (last 3 results) Recent Labs    06/26/23 1546 01/27/24 1613  BNP 111.2* 36.8    ProBNP (last 3 results) Recent Labs    12/27/23 1105  PROBNP 1,952*    CBG: No results for input(s): "GLUCAP" in the last 168 hours. Recent Results (from the past 240 hours)  Resp panel by RT-PCR (RSV, Flu A&B, Covid) Anterior Nasal Swab     Status: None   Collection Time: 01/27/24  4:13 PM   Specimen: Anterior Nasal Swab  Result Value Ref Range Status   SARS Coronavirus 2 by RT PCR NEGATIVE NEGATIVE Final   Influenza A by PCR NEGATIVE NEGATIVE Final   Influenza B by PCR NEGATIVE NEGATIVE Final    Comment: (NOTE) The Xpert Xpress SARS-CoV-2/FLU/RSV plus assay is intended as an aid in the diagnosis of influenza from Nasopharyngeal swab specimens and should not be used as a sole basis for treatment. Nasal washings and aspirates are unacceptable for Xpert Xpress SARS-CoV-2/FLU/RSV testing.  Fact Sheet for Patients: BloggerCourse.com  Fact Sheet for Healthcare Providers: SeriousBroker.it  This test is not yet approved or cleared by the [place] FDA and has been  authorized for detection and/or diagnosis of SARS-CoV-2 by FDA under an Emergency Use Authorization (EUA). This EUA will remain in effect (meaning this test can be used) for the duration of the COVID-19 declaration under Section 564(b)(1) of the Act, 21 U.S.C. section 360bbb-3(b)(1), unless the authorization is terminated or revoked.     Resp Syncytial Virus by PCR NEGATIVE NEGATIVE Final    Comment: (NOTE) Fact Sheet for Patients: BloggerCourse.com  Fact Sheet for Healthcare Providers: SeriousBroker.it  This test is not yet approved or cleared by the [place] FDA and has been authorized for detection and/or diagnosis of SARS-CoV-2 by FDA under an Emergency Use Authorization (EUA). This EUA will remain in effect (meaning this test can be used) for the duration of the COVID-19 declaration under Section 564(b)(1) of the Act, 21 U.S.C. section 360bbb-3(b)(1), unless the authorization is terminated or revoked.  Performed at [facility], [address] [place], [place] [address]   Blood culture (routine x 2)     Status: None (Preliminary result)   Collection Time: 01/27/24  9:00 PM   Specimen: BLOOD RIGHT ARM  Result Value Ref Range Status   Specimen Description BLOOD RIGHT ARM  Final   Special Requests   Final    BOTTLES DRAWN AEROBIC AND ANAEROBIC Blood Culture results may not be optimal due to an inadequate volume of blood received in culture bottles   Culture   Final    NO GROWTH < 12 HOURS Performed at [facility], [address] [place], [place] [address]    Report Status PENDING  Incomplete  Blood culture (routine x 2)     Status: None (Preliminary result)   Collection Time: 01/27/24  9:05 PM   Specimen: BLOOD LEFT ARM  Result Value Ref Range Status   Specimen Description BLOOD LEFT ARM  Final   Special Requests   Final    BOTTLES DRAWN AEROBIC AND ANAEROBIC Blood Culture results may not be optimal  due to an inadequate volume of blood received in culture bottles   Culture   Final  NO GROWTH < 12 HOURS Performed at [facility], [address] [place], [place] [address]    Report Status PENDING  Incomplete     Studies: CT CHEST ABDOMEN PELVIS WO CONTRAST Result Date: 01/27/2024 CLINICAL DATA:  Sepsis EXAM: CT CHEST, ABDOMEN AND PELVIS WITHOUT CONTRAST TECHNIQUE: Multidetector CT imaging of the chest, abdomen and pelvis was performed following the standard protocol without IV contrast. RADIATION DOSE REDUCTION: This exam was performed according to the departmental dose-optimization program which includes automated exposure control, adjustment of the mA and/or kV according to patient size and/or use of iterative reconstruction technique. COMPARISON:  Chest x-ray today.  Chest CT 04/17/2015 FINDINGS: CT CHEST FINDINGS Cardiovascular: Heart is borderline in size. Scattered coronary artery and aortic calcifications. No evidence of aortic aneurysm. Mediastinum/Nodes: No mediastinal, hilar, or axillary adenopathy. Trachea and esophagus are unremarkable. Thyroid unremarkable. Lungs/Pleura: Mild centrilobular emphysema. Airspace opacities noted in the left upper lobe and left lower lobe most compatible with pneumonia. Nodular area in the left upper lobe measuring up to 1.6 cm likely also related to the infectious process, but follow-up recommended after treatment to ensure resolution. Mild peribronchial thickening. No confluent opacity on the right. No effusions. Musculoskeletal: Chest wall soft tissues are unremarkable. No acute bony abnormality. CT ABDOMEN PELVIS FINDINGS Hepatobiliary: Low-density lesion in the inferior right hepatic lobe measures up to 4.5 cm and is stable since prior study most compatible with cysts. Prior cholecystectomy. No suspicious focal hepatic abnormality or biliary ductal dilatation. Pancreas: No focal abnormality or ductal dilatation. Spleen: No focal abnormality.   Normal size. Adrenals/Urinary Tract: Adrenal glands normal. 5.5 cm lesion in the upper pole of the left kidney is isodense to the kidney. This is slightly larger than prior study, but most likely reflects a hemorrhagic or proteinaceous cyst. Numerous bilateral low-density lesions in the kidneys are most compatible with cysts. No follow-up imaging recommended. No hydronephrosis. Right lower quadrant urinary diversion. Prior cystectomy. Stomach/Bowel: Normal appendix. Stomach, large and small bowel grossly unremarkable. Small supraumbilical ventral hernia contains the anterior wall of the transverse colon. No bowel obstruction. Vascular/Lymphatic: Aortoiliac atherosclerosis. No evidence of aneurysm or adenopathy. Reproductive: No visible focal abnormality. Other: No free fluid or free air. Musculoskeletal: Postoperative and degenerative changes in the lumbar spine. No acute bony abnormality. IMPRESSION: Patchy airspace opacity in the left upper lobe and left lower lobe most compatible with pneumonia. Nodular component in the left upper lobe most likely also related to pneumonia, but recommend follow-up CT in 3-6 months after treatment to ensure resolution. Mild emphysema. Coronary artery disease, aortic atherosclerosis. No acute findings in the abdomen or pelvis. Electronically Signed   By: Charlett Nose M.D.   On: 01/27/2024 20:14   VAS [place] LOWER EXTREMITY VENOUS (DVT) (7a-7p) Result Date: 01/27/2024  Lower Venous DVT Study Patient Name:  Thorin Starner.  Date of Exam:   01/27/2024 Medical Rec #: [id number]          Accession #:    [id number] Date of Birth: 08/01/1941           Patient Gender: M Patient Age:   17 years Exam Location:  [facility] Procedure:      VAS [place] LOWER EXTREMITY VENOUS (DVT) Referring Phys: Gwyneth Sprout --------------------------------------------------------------------------------  Indications: Edema, Syncope, and SOB.  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 Comparison Study: No prior study on file Performing Technologist: Sherren Kerns RVS  Examination Guidelines: A  complete evaluation includes B-mode imaging, spectral Doppler, color Doppler, and power Doppler as needed of all accessible portions of each vessel. Bilateral testing is considered an integral part of a complete examination. Limited examinations for reoccurring indications may be performed as noted. The reflux portion of the exam is performed with the patient in reverse Trendelenburg.  +---------+---------------+---------+-----------+---------------+-------------+ RIGHT    CompressibilityPhasicitySpontaneityProperties     Thrombus                                                                 Aging         +---------+---------------+---------+-----------+---------------+-------------+ CFV      Full           Yes      Yes        Pulsatile                                                                waveform                     +---------+---------------+---------+-----------+---------------+-------------+ SFJ      Full                                                            +---------+---------------+---------+-----------+---------------+-------------+ FV Prox  Full                                                            +---------+---------------+---------+-----------+---------------+-------------+ FV Mid   Full                                                            +---------+---------------+---------+-----------+---------------+-------------+ FV DistalFull                                                            +---------+---------------+---------+-----------+---------------+-------------+ PFV      Full                                                            +---------+---------------+---------+-----------+---------------+-------------+ POP      Full  Yes      No         Pulsatile                                                                waveform                     +---------+---------------+---------+-----------+---------------+-------------+ PTV      Full                                                            +---------+---------------+---------+-----------+---------------+-------------+ PERO     Full                                                            +---------+---------------+---------+-----------+---------------+-------------+ Gastroc  Full                                                            +---------+---------------+---------+-----------+---------------+-------------+   +----+---------------+---------+-----------+------------------+--------------+ LEFTCompressibilityPhasicitySpontaneityProperties        Thrombus Aging +----+---------------+---------+-----------+------------------+--------------+ CFV Full           Yes      No         Pulsatile waveform               +----+---------------+---------+-----------+------------------+--------------+     Summary: RIGHT: - There is no evidence of deep vein thrombosis in the lower extremity.  - No cystic structure found in the popliteal fossa. Pulsatile waveforms throughout. Interstitial edema noted throughout right lower extremity.  LEFT: - No evidence of common femoral vein obstruction.  Pulsatile waveform.  *See table(s) above for measurements and observations.    Preliminary    DG Chest Port 1 View Result Date: 01/27/2024 CLINICAL DATA:  Near syncope, short of breath EXAM: PORTABLE CHEST 1 VIEW COMPARISON:  06/26/2023 FINDINGS: Single frontal view of the chest demonstrates an enlarged cardiac silhouette. There is patchy left basilar consolidation, increased since prior exam. No effusion or pneumothorax. No acute bony abnormalities. IMPRESSION: 1. Patchy left basilar consolidation, slightly more pronounced than prior study.  While this could reflect recurrent aspiration or persistent infection, lack of documented interval clearance may warrant further evaluation with CT. 2. Enlarged cardiac silhouette. Electronically Signed   By: Sharlet Salina M.D.   On: 01/27/2024 16:27      Joycelyn Das, MD  [facility] 01/28/2024  If 7PM-7AM, please contact night-coverage

## 2024-01-28 NOTE — Progress Notes (Signed)
   01/28/24 2233  BiPAP/CPAP/SIPAP  Reason BIPAP/CPAP not in use Non-compliant (pt refused to wear BIPAP. pt states cant tolerate it at the moment.. will continue to monitor)

## 2024-01-28 NOTE — Progress Notes (Signed)
 PT arrived to the floor via stretcher by RN. Pt alert and oriented to person, place, time, but not situation, no acute distress not. Pt oriented to room and encouraged to use call bell for assistance. Bed in low position and call bell within reach, bed alarm on.

## 2024-01-28 NOTE — ED Notes (Addendum)
 This nurse was never contacted regarding patient critical troponin that resulted at 2156. Attending Dorrel, Molly Maduro, MD notified and notified of critical troponin result of 5.148. Attending said he saw the result and ordered the heparin drip.

## 2024-01-28 NOTE — ED Notes (Signed)
Changed patient's urostomy bag.

## 2024-01-28 NOTE — ED Notes (Signed)
 When consulting pharmacist [place] here in the ED regarding what is needed for the area of infiltration, she stated since it was LR running she said to elevated the effected limb and continue to monitor it.

## 2024-01-28 NOTE — Progress Notes (Signed)
 PHARMACY - ANTICOAGULATION CONSULT NOTE  Pharmacy Consult for IV heparin Indication: chest pain/ACS  No Known Allergies  Patient Measurements: Height: 6' (182.9 cm) Weight: 72.6 kg (160 lb) IBW/kg (Calculated) : 77.6 Heparin Dosing Weight: 72.6 kg  Vital Signs: Temp: 97.9 F (36.6 C) (03/17 0835) Temp Source: Axillary (03/17 0835) BP: 133/86 (03/17 0709) Pulse Rate: 91 (03/17 0709)  Labs: Recent Labs    01/27/24 1613 01/27/24 1633 01/27/24 2054 01/27/24 2248 01/28/24 0148 01/28/24 0158 01/28/24 0430 01/28/24 0432 01/28/24 0718 01/28/24 0719  HGB 11.1*   < >  --    < > 11.9*  --   --  10.6*  --  11.6*  HCT 40.6   < >  --    < > 35.0*  --   --  37.6*  --  34.0*  PLT 233  --   --   --   --   --   --  210  --   --   HEPARINUNFRC  --   --   --   --   --   --   --   --  <0.10*  --   CREATININE 2.41*  --   --   --   --  1.92*  --   --   --   --   TROPONINIHS 775*  --  5,148*  --   --  5,348* 6,425*  --   --   --    < > = values in this interval not displayed.    Estimated Creatinine Clearance: 31.5 mL/min (A) (by C-G formula based on SCr of 1.92 mg/dL (H)).   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 is a 83 y.o. year old male admitted on 01/27/2024 with concern for ACS. Trop 775 > 5148. No PE noted on CTA.  No anticoagulation prior to admission. Pharmacy consulted to dose heparin.  Initial heparin level undetectable after initial bolus and rate 850 units/hr.  No interruptions in heparin infusion or s/sx bleeding per RN.  CBC stable.   Goal of Therapy:  Heparin level 0.3-0.7 units/ml Monitor platelets by anticoagulation protocol: Yes   Plan:  Heparin 2100 bolus, rate increase to 1050 units/hr 8 heparin level  Daily heparin level, CBC, and monitoring for bleeding F/u plans for anticoagulation and cards recs  Thank you for allowing pharmacy to participate in this patient's care.  Trixie Rude, PharmD Clinical Pharmacist 01/28/2024  8:45 AM

## 2024-01-28 NOTE — ED Notes (Signed)
 This nurse was not contacted regarding patient's critical values.

## 2024-01-28 NOTE — Hospital Course (Addendum)
 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  Troponin was elevated at [phone].  Lactic acid elevated at 3.3., CXR-patchy left basilar consolidation,CT chest showed PNA LUL/LLL.  Patient was placed on heparin drip, IV antibiotics and admitted for sepsis/pneumonia/respiratory failure/NSTEMI. 3/18> Drop in hemoglobin without any obvious bleeding.  Anemia panel consistent with anemia of chronic disease and some iron deficiency. For ongoing hypoxia management bronchodilators, diuretics, Solu-Medrol started 3/20 due to COPD, and on home dulera as well.  Oxygen requirement has subsequently improved on 1 L [place], doing well with mobility leukocytosis resolved.  At this time patient is eager to go home on 3/23.  Chest x-ray repeated no significant changes.Patient was again mobilized in the afternoon to ensure he is stable for discharge.   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

## 2024-01-28 NOTE — Progress Notes (Signed)
 Pt taken off BIPAP and placed on 5L [place]. Pt vitals are stable.

## 2024-01-28 NOTE — Progress Notes (Signed)
 PHARMACY - ANTICOAGULATION CONSULT NOTE  Pharmacy Consult for IV heparin Indication: chest pain/ACS  No Known Allergies  Patient Measurements: Height: 6' (182.9 cm) Weight: 72.6 kg (160 lb) IBW/kg (Calculated) : 77.6 Heparin Dosing Weight: 72.6 kg  Vital Signs: Temp: 98.1 F (36.7 C) (03/17 1658) Temp Source: Oral (03/17 1658) BP: 141/52 (03/17 1658) Pulse Rate: 95 (03/17 1658)  Labs: Recent Labs    01/27/24 1613 01/27/24 1633 01/28/24 0148 01/28/24 0158 01/28/24 0430 01/28/24 0432 01/28/24 0718 01/28/24 0719 01/28/24 1412 01/28/24 1756  HGB 11.1*   < > 11.9*  --   --  10.6*  --  11.6*  --   --   HCT 40.6   < > 35.0*  --   --  37.6*  --  34.0*  --   --   PLT 233  --   --   --   --  210  --   --   --   --   HEPARINUNFRC  --   --   --   --   --   --  <0.10*  --   --  0.15*  CREATININE 2.41*  --   --  1.92*  --   --   --   --   --   --   TROPONINIHS 775*   < >  --  5,348* 6,425*  --  5,851*  --  3,410*  --    < > = values in this interval not displayed.    Estimated Creatinine Clearance: 31.5 mL/min (A) (by C-G formula based on SCr of 1.92 mg/dL (H)).   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 is a 83 y.o. year old male admitted on 01/27/2024 with concern for ACS. Trop 775 > 5148. No PE noted on CTA.  No anticoagulation prior to admission. Pharmacy consulted to dose heparin.  Heparin level low this evening, but per RN, pt had lots of trouble with the heparin infusion today, IV constantly beeping, stopping.  Improved with recent replacement of IV line.  Goal of Therapy:  Heparin level 0.3-0.7 units/ml Monitor platelets by anticoagulation protocol: Yes   Plan:  Continue IV heparin at current rate of 1050 units/hr. 8 heparin level  Daily heparin level, CBC, and monitoring for bleeding F/u plans for anticoagulation and cards recs  Thank you for allowing pharmacy to participate in this patient's care.  Reece Leader, Colon Flattery, BCCP Clinical Pharmacist  01/28/2024 7:09 PM   [facility] pharmacy phone numbers are listed on [email or web address]

## 2024-01-29 ENCOUNTER — Encounter (HOSPITAL_COMMUNITY)
Admission: EM | Disposition: A | Discharge status: Home Health | Payer: Self-pay | Source: Home / Self Care | Attending: Internal Medicine

## 2024-01-29 ENCOUNTER — Ambulatory Visit (HOSPITAL_COMMUNITY): Admit: 2024-01-29 | Admitting: Cardiology

## 2024-01-29 DIAGNOSIS — I10 Essential (primary) hypertension: Secondary | ICD-10-CM

## 2024-01-29 DIAGNOSIS — J189 Pneumonia, unspecified organism: Secondary | ICD-10-CM | POA: Diagnosis not present

## 2024-01-29 DIAGNOSIS — G4733 Obstructive sleep apnea (adult) (pediatric): Secondary | ICD-10-CM

## 2024-01-29 DIAGNOSIS — J4520 Mild intermittent asthma, uncomplicated: Secondary | ICD-10-CM

## 2024-01-29 DIAGNOSIS — J9602 Acute respiratory failure with hypercapnia: Secondary | ICD-10-CM

## 2024-01-29 DIAGNOSIS — I214 Non-ST elevation (NSTEMI) myocardial infarction: Secondary | ICD-10-CM

## 2024-01-29 DIAGNOSIS — E782 Mixed hyperlipidemia: Secondary | ICD-10-CM | POA: Diagnosis not present

## 2024-01-29 DIAGNOSIS — R0602 Shortness of breath: Secondary | ICD-10-CM | POA: Diagnosis not present

## 2024-01-29 DIAGNOSIS — J9601 Acute respiratory failure with hypoxia: Secondary | ICD-10-CM

## 2024-01-29 HISTORY — PX: LOWER EXTREMITY VENOGRAPHY: CATH118253

## 2024-01-29 HISTORY — PX: RIGHT/LEFT HEART CATH AND CORONARY ANGIOGRAPHY: CATH118266

## 2024-01-29 LAB — POCT I-STAT EG7
Acid-base deficit: 3 mmol/L — ABNORMAL HIGH (ref 0.0–2.0)
Acid-base deficit: 4 mmol/L — ABNORMAL HIGH (ref 0.0–2.0)
Bicarbonate: 22.6 mmol/L (ref 20.0–28.0)
Bicarbonate: 23.2 mmol/L (ref 20.0–28.0)
Calcium, Ion: 1.31 mmol/L (ref 1.15–1.40)
Calcium, Ion: 1.32 mmol/L (ref 1.15–1.40)
HCT: 32 % — ABNORMAL LOW (ref 39.0–52.0)
HCT: 32 % — ABNORMAL LOW (ref 39.0–52.0)
Hemoglobin: 10.9 g/dL — ABNORMAL LOW (ref 13.0–17.0)
Hemoglobin: 10.9 g/dL — ABNORMAL LOW (ref 13.0–17.0)
O2 Saturation: 55 %
O2 Saturation: 57 %
Potassium: 4.6 mmol/L (ref 3.5–5.1)
Potassium: 4.7 mmol/L (ref 3.5–5.1)
Sodium: 143 mmol/L (ref 135–145)
Sodium: 144 mmol/L (ref 135–145)
TCO2: 24 mmol/L (ref 22–32)
TCO2: 25 mmol/L (ref 22–32)
pCO2, Ven: 43.7 mmHg — ABNORMAL LOW (ref 44–60)
pCO2, Ven: 44.6 mmHg (ref 44–60)
pH, Ven: 7.314 (ref 7.25–7.43)
pH, Ven: 7.334 (ref 7.25–7.43)
pO2, Ven: 32 mmHg (ref 32–45)
pO2, Ven: 32 mmHg (ref 32–45)

## 2024-01-29 LAB — POCT I-STAT 7, (LYTES, BLD GAS, ICA,H+H)
Acid-base deficit: 5 mmol/L — ABNORMAL HIGH (ref 0.0–2.0)
Bicarbonate: 20.8 mmol/L (ref 20.0–28.0)
Calcium, Ion: 1.29 mmol/L (ref 1.15–1.40)
HCT: 32 % — ABNORMAL LOW (ref 39.0–52.0)
Hemoglobin: 10.9 g/dL — ABNORMAL LOW (ref 13.0–17.0)
O2 Saturation: 92 %
Potassium: 4.5 mmol/L (ref 3.5–5.1)
Sodium: 140 mmol/L (ref 135–145)
TCO2: 22 mmol/L (ref 22–32)
pCO2 arterial: 39.3 mmHg (ref 32–48)
pH, Arterial: 7.333 — ABNORMAL LOW (ref 7.35–7.45)
pO2, Arterial: 68 mmHg — ABNORMAL LOW (ref 83–108)

## 2024-01-29 LAB — LIPID PANEL
Cholesterol: 119 mg/dL (ref 0–200)
HDL: 61 mg/dL (ref >40)
LDL Cholesterol: 39 mg/dL (ref 0–99)
Total CHOL/HDL Ratio: 2 ratio
Triglycerides: 94 mg/dL (ref <150)
VLDL: 19 mg/dL (ref 0–40)

## 2024-01-29 LAB — RETICULOCYTES
Immature Retic Fract: 17.8 % — ABNORMAL HIGH (ref 2.3–15.9)
RBC.: 4.49 MIL/uL (ref 4.22–5.81)
Retic Count, Absolute: 61.5 10*3/uL (ref 19.0–186.0)
Retic Ct Pct: 1.4 % (ref 0.4–3.1)

## 2024-01-29 LAB — COMPREHENSIVE METABOLIC PANEL
ALT: 128 U/L — ABNORMAL HIGH (ref 0–44)
AST: 34 U/L (ref 15–41)
Albumin: 2.6 g/dL — ABNORMAL LOW (ref 3.5–5.0)
Alkaline Phosphatase: 53 U/L (ref 38–126)
Anion gap: 10 (ref 5–15)
BUN: 52 mg/dL — ABNORMAL HIGH (ref 8–23)
CO2: 22 mmol/L (ref 22–32)
Calcium: 8.8 mg/dL — ABNORMAL LOW (ref 8.9–10.3)
Chloride: 110 mmol/L (ref 98–111)
Creatinine, Ser: 1.94 mg/dL — ABNORMAL HIGH (ref 0.61–1.24)
GFR, Estimated: 34 mL/min — ABNORMAL LOW (ref >60)
Glucose, Bld: 127 mg/dL — ABNORMAL HIGH (ref 70–99)
Potassium: 5 mmol/L (ref 3.5–5.1)
Sodium: 142 mmol/L (ref 135–145)
Total Bilirubin: 0.7 mg/dL (ref 0.0–1.2)
Total Protein: 5.4 g/dL — ABNORMAL LOW (ref 6.5–8.1)

## 2024-01-29 LAB — IRON AND TIBC
Iron: 19 ug/dL — ABNORMAL LOW (ref 45–182)
Saturation Ratios: 6 % — ABNORMAL LOW (ref 17.9–39.5)
TIBC: 307 ug/dL (ref 250–450)
UIBC: 288 ug/dL

## 2024-01-29 LAB — CBC
HCT: 33.2 % — ABNORMAL LOW (ref 39.0–52.0)
Hemoglobin: 9.6 g/dL — ABNORMAL LOW (ref 13.0–17.0)
MCH: 21.5 pg — ABNORMAL LOW (ref 26.0–34.0)
MCHC: 28.9 g/dL — ABNORMAL LOW (ref 30.0–36.0)
MCV: 74.4 fL — ABNORMAL LOW (ref 80.0–100.0)
Platelets: 193 10*3/uL (ref 150–400)
RBC: 4.46 MIL/uL (ref 4.22–5.81)
RDW: 20.9 % — ABNORMAL HIGH (ref 11.5–15.5)
WBC: 19.5 10*3/uL — ABNORMAL HIGH (ref 4.0–10.5)
nRBC: 0 % (ref 0.0–0.2)

## 2024-01-29 LAB — FERRITIN: Ferritin: 76 ng/mL (ref 24–336)

## 2024-01-29 LAB — POCT ACTIVATED CLOTTING TIME: Activated Clotting Time: 129 s

## 2024-01-29 LAB — FOLATE: Folate: 10.5 ng/mL (ref >5.9)

## 2024-01-29 LAB — HEPARIN LEVEL (UNFRACTIONATED): Heparin Unfractionated: 0.25 [IU]/mL — ABNORMAL LOW (ref 0.30–0.70)

## 2024-01-29 LAB — VITAMIN B12: Vitamin B-12: 759 pg/mL (ref 180–914)

## 2024-01-29 LAB — MAGNESIUM: Magnesium: 1.6 mg/dL — ABNORMAL LOW (ref 1.7–2.4)

## 2024-01-29 SURGERY — RIGHT/LEFT HEART CATH AND CORONARY ANGIOGRAPHY
Anesthesia: LOCAL

## 2024-01-29 MED ORDER — SODIUM CHLORIDE 0.9 % IV SOLN: 250.0000 mL | INTRAVENOUS | Status: AC | PRN

## 2024-01-29 MED ORDER — FERROUS SULFATE 325 (65 FE) MG PO TABS
325.0000 mg | ORAL_TABLET | Freq: Every day | ORAL | Status: DC
  Administered 2024-01-30 – 2024-02-03 (×5): 325 mg via ORAL
  Filled 2024-01-29 (×5): qty 1

## 2024-01-29 MED ORDER — SODIUM CHLORIDE 0.9% FLUSH
3.0000 mL | Freq: Two times a day (BID) | INTRAVENOUS | Status: DC
  Administered 2024-01-30 – 2024-02-03 (×5): 3 mL via INTRAVENOUS

## 2024-01-29 MED ORDER — SODIUM CHLORIDE 0.9 % IV SOLN: INTRAVENOUS | Status: DC

## 2024-01-29 MED ORDER — ENOXAPARIN SODIUM 30 MG/0.3ML IJ SOSY
30.0000 mg | PREFILLED_SYRINGE | INTRAMUSCULAR | Status: DC
  Administered 2024-01-30 – 2024-02-03 (×5): 30 mg via SUBCUTANEOUS
  Filled 2024-01-29 (×6): qty 0.3

## 2024-01-29 MED ORDER — HEPARIN SODIUM (PORCINE) 1000 UNIT/ML IJ SOLN
INTRAMUSCULAR | Status: DC | PRN
  Administered 2024-01-29: 3000 [IU] via INTRAVENOUS

## 2024-01-29 MED ORDER — IOHEXOL 350 MG/ML SOLN
INTRAVENOUS | Status: DC | PRN
  Administered 2024-01-29: 30 mL via INTRA_ARTERIAL

## 2024-01-29 MED ORDER — SODIUM CHLORIDE 0.9% FLUSH
3.0000 mL | INTRAVENOUS | Status: DC | PRN
  Administered 2024-02-01: 3 mL via INTRAVENOUS

## 2024-01-29 MED ORDER — VERAPAMIL HCL 2.5 MG/ML IV SOLN
INTRAVENOUS | Status: AC
  Filled 2024-01-29: qty 2

## 2024-01-29 MED ORDER — VERAPAMIL HCL 2.5 MG/ML IV SOLN
INTRAVENOUS | Status: DC | PRN
  Administered 2024-01-29: 10 mL via INTRA_ARTERIAL

## 2024-01-29 MED ORDER — LIDOCAINE HCL (PF) 1 % IJ SOLN
INTRAMUSCULAR | Status: AC
  Filled 2024-01-29: qty 30

## 2024-01-29 MED ORDER — HEPARIN SODIUM (PORCINE) 1000 UNIT/ML IJ SOLN
INTRAMUSCULAR | Status: AC
  Filled 2024-01-29: qty 10

## 2024-01-29 MED ORDER — LEVALBUTEROL HCL 0.63 MG/3ML IN NEBU
0.6300 mg | INHALATION_SOLUTION | Freq: Once | RESPIRATORY_TRACT | Status: AC | PRN
Start: 2024-01-29 — End: 2024-01-29
  Administered 2024-01-29: 0.63 mg via RESPIRATORY_TRACT
  Filled 2024-01-29: qty 3

## 2024-01-29 MED ORDER — LEVALBUTEROL HCL 0.63 MG/3ML IN NEBU
INHALATION_SOLUTION | RESPIRATORY_TRACT | Status: DC | PRN
  Administered 2024-01-29: .63 mg via RESPIRATORY_TRACT

## 2024-01-29 MED ORDER — MAGNESIUM SULFATE 2 GM/50ML IV SOLN
2.0000 g | Freq: Once | INTRAVENOUS | Status: AC
  Administered 2024-01-29: 2 g via INTRAVENOUS
  Filled 2024-01-29: qty 50

## 2024-01-29 MED ORDER — ATORVASTATIN CALCIUM 40 MG PO TABS
40.0000 mg | ORAL_TABLET | Freq: Every day | ORAL | Status: DC
  Administered 2024-01-29 – 2024-01-30 (×2): 40 mg via ORAL
  Filled 2024-01-29 (×2): qty 1

## 2024-01-29 MED ORDER — LIDOCAINE HCL (PF) 1 % IJ SOLN
INTRAMUSCULAR | Status: DC | PRN
  Administered 2024-01-29: 8 mL
  Administered 2024-01-29: 2 mL

## 2024-01-29 MED ORDER — HEPARIN (PORCINE) IN NACL 1000-0.9 UT/500ML-% IV SOLN
INTRAVENOUS | Status: DC | PRN
  Administered 2024-01-29 (×2): 500 mL

## 2024-01-29 SURGICAL SUPPLY — 17 items
CATH 5FR JL3.5 JR4 ANG PIG MP (CATHETERS) IMPLANT
CATH INFINITI 5FR MULTPACK ANG (CATHETERS) IMPLANT
CATH SWAN GANZ 7F STRAIGHT (CATHETERS) IMPLANT
COVER PRB 48X5XTLSCP FOLD TPE (BAG) IMPLANT
DEVICE RAD COMP TR BAND LRG (VASCULAR PRODUCTS) IMPLANT
GLIDESHEATH SLEND SS 6F .021 (SHEATH) IMPLANT
GUIDEWIRE INQWIRE 1.5J.035X260 (WIRE) IMPLANT
INQWIRE 1.5J .035X260CM (WIRE) ×2 IMPLANT
KIT MICROPUNCTURE NIT STIFF (SHEATH) IMPLANT
PACK CARDIAC CATHETERIZATION (CUSTOM PROCEDURE TRAY) ×2 IMPLANT
PROTECTION STATION PRESSURIZED (MISCELLANEOUS) ×2 IMPLANT
SET ATX-X65L (MISCELLANEOUS) IMPLANT
SHEATH GLIDE SLENDER 4/5FR (SHEATH) IMPLANT
SHEATH PINNACLE 7F 10CM (SHEATH) IMPLANT
STATION PROTECTION PRESSURIZED (MISCELLANEOUS) IMPLANT
WIRE EMERALD 3MM-J .025X260CM (WIRE) IMPLANT
WIRE HI TORQ VERSACORE-J 145CM (WIRE) IMPLANT

## 2024-01-29 NOTE — Progress Notes (Addendum)
 Patient Name: Jerry [name]. Date of Encounter: 01/29/2024 [facility] Cardiologist: Elder Negus, MD   Interval Summary  .    Patient resting comfortably in the bed. Says he is doing okay  Currently on 2 L oxygen via [place] Says he did not get good sleep last night because his IV pumps kept going off NPO since midnight. Ready for his RHC/LHC later today    Vital Signs .    Vitals:   01/28/24 2300 01/28/24 2315 01/29/24 0015 01/29/24 0400  BP:   105/63 118/71  Pulse:  97 94   Resp:  (!) 22 (!) 24   Temp:   99.4 F (37.4 C) 98.9 F (37.2 C)  TempSrc:   Oral Oral  SpO2:  95% 91%   Weight: 72.5 kg     Height:       Intake/Output Summary (Last 24 hours) at 01/29/2024 0919 Last data filed at 01/29/2024 0507 Gross per 24 hour  Intake 1116.17 ml  Output 2250 ml  Net -1133.83 ml      01/28/2024   11:00 PM 01/27/2024    3:16 PM 12/22/2023    5:10 AM  Last 3 Weights  Weight (lbs) 159 lb 13.3 oz 160 lb 160 lb  Weight (kg) 72.5 kg 72.576 kg 72.576 kg      Telemetry/ECG    Sinus rhythm HR 90-100s  - Personally Reviewed  Physical Exam .   GEN: No acute distress, currently on 2 L oxygen via [place].   Neck: No JVD Cardiac: RRR, no murmurs, rubs, or gallops.  Respiratory: Clear to auscultation bilaterally. GI: Soft, nontender, non-distended  MS: No edema  Assessment & Plan .   [name]. 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    NSTEMI  Coronary calcifications noted on CT Patient reported DOE and dizziness in the ED, no chest pain -- feeling much better today Currently on 2 L oxygen via  SpO2 at 94-98% Troponin 775 > 5,148 > 5,348 > 6,425 > 5,851 > 3,410 Echo from 12/2022: EF 55-60%, moderate LVH, mild RV dysfunction, flattened intraventricular septum, trivial MR Ordered lipid panel -- will add on  Lipitor 40 mg daily    Continue IV heparin  Continue ASA 81 mg daily  Continue Lopressor 25 mg BID  NPO since midnight -- scheduled for RHC/LHC at 12 pm today    Hypomagnesemia Mag 1.6 today Supplementation ordered    Per primary Sepsis and acute hypoxic failure 2/2 CAP CKD stage 3b Anxiety Depression Insomnia Chronic pain Vascular dementia  Hypertension    For questions or updates, please contact [facility] Please consult [email or web address] for contact info under        Signed, Olena Leatherwood, PA-C   ATTENDING ATTESTATION:  After conducting a review of all available clinical information with the care team, interviewing the patient, and performing a physical exam, I agree with the findings and plan described in this note.   GEN: No acute distress.   HEENT:  MMM, no JVD, no scleral icterus Cardiac: RRR, no murmurs, rubs, or gallops.  Respiratory: Clear to auscultation bilaterally. GI: Soft, nontender, non-distended  MS: No edema; No deformity. Neuro:  Nonfocal  Vasc:  +2 radial pulses  Patient is improved today, feels less dyspneic.  Reamins afebrile and non-toxic.   Did not sleep  well due to constant noise.  NPO for planned cor angio/PCI + RHC (due to elevated PA pressures and RV dilatation/dysfunction on recent TTE).  Continue IV heparin, ASA, lopressor 25mg  BID.  Cr at baseline ~1.9.  R radial site appropriate, I asked nursing to obtain R A/C IV for RHC.  I have reviewed the risks, indications, and alternatives to cardiac catheterization, possible angioplasty, and stenting with the patient. Risks include but are not limited to bleeding, infection, vascular injury, stroke, myocardial infection, arrhythmia, kidney injury, radiation-related injury in the case of prolonged fluoroscopy use, emergency cardiac surgery, and death. The patient understands the risks of serious complication is 1-2 in 1000 with diagnostic cardiac cath and 1-2% or less with  angioplasty/stenting.    Alverda Skeans, MD Pager [phone]

## 2024-01-29 NOTE — Plan of Care (Signed)
   Problem: Education: Goal: Knowledge of General Education information will improve Description: Including pain rating scale, medication(s)/side effects and non-pharmacologic comfort measures Outcome: Not Progressing   Problem: Health Behavior/Discharge Planning: Goal: Ability to manage health-related needs will improve Outcome: Not Progressing

## 2024-01-29 NOTE — Progress Notes (Signed)
 PT Cancellation Note  Patient Details Name: Jerry [name]. MRN: [id number] DOB: 03-01-1941   Cancelled Treatment:    Reason Eval/Treat Not Completed: Other (comment) PT orders received, chart reviewed. Pt admitted for tx of NSTEMI & anticipating RHC/LHC at 12pm today. Will hold PT evaluation at this time & f/u after procedure.  Aleda Grana, PT, DPT 01/29/24, 11:20 AM   [name] 01/29/2024, 11:20 AM

## 2024-01-29 NOTE — Interval H&P Note (Signed)
 History and Physical Interval Note:  01/29/2024 1:26 PM  Jerry [name].  has presented today for surgery, with the diagnosis of NSTEMI.  The various methods of treatment have been discussed with the patient and family. After consideration of risks, benefits and other options for treatment, the patient has consented to  Procedure(s): RIGHT/LEFT HEART CATH AND CORONARY ANGIOGRAPHY (N/A) as a surgical intervention.  The patient's history has been reviewed, patient examined, no change in status, stable for surgery.  I have reviewed the patient's chart and labs.  Questions were answered to the patient's satisfaction.   Cath Lab Visit (complete for each Cath Lab visit)  Clinical Evaluation Leading to the Procedure:   ACS: Yes.    Non-ACS:    Anginal Classification: CCS II  Anti-ischemic medical therapy: Maximal Therapy (2 or more classes of medications)  Non-Invasive Test Results: No non-invasive testing performed  Prior CABG: No previous CABG        Theron Arista [facility] 01/29/2024 1:26 PM

## 2024-01-29 NOTE — H&P (View-Only) (Signed)
 Patient Name: Jerry [name]. Date of Encounter: 01/29/2024 [facility] Cardiologist: Elder Negus, MD   Interval Summary  .    Patient resting comfortably in the bed. Says he is doing okay  Currently on 2 L oxygen via [place] Says he did not get good sleep last night because his IV pumps kept going off NPO since midnight. Ready for his RHC/LHC later today    Vital Signs .    Vitals:   01/28/24 2300 01/28/24 2315 01/29/24 0015 01/29/24 0400  BP:   105/63 118/71  Pulse:  97 94   Resp:  (!) 22 (!) 24   Temp:   99.4 F (37.4 C) 98.9 F (37.2 C)  TempSrc:   Oral Oral  SpO2:  95% 91%   Weight: 72.5 kg     Height:       Intake/Output Summary (Last 24 hours) at 01/29/2024 0919 Last data filed at 01/29/2024 0507 Gross per 24 hour  Intake 1116.17 ml  Output 2250 ml  Net -1133.83 ml      01/28/2024   11:00 PM 01/27/2024    3:16 PM 12/22/2023    5:10 AM  Last 3 Weights  Weight (lbs) 159 lb 13.3 oz 160 lb 160 lb  Weight (kg) 72.5 kg 72.576 kg 72.576 kg      Telemetry/ECG    Sinus rhythm HR 90-100s  - Personally Reviewed  Physical Exam .   GEN: No acute distress, currently on 2 L oxygen via [place].   Neck: No JVD Cardiac: RRR, no murmurs, rubs, or gallops.  Respiratory: Clear to auscultation bilaterally. GI: Soft, nontender, non-distended  MS: No edema  Assessment & Plan .   [name]. 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    NSTEMI  Coronary calcifications noted on CT Patient reported DOE and dizziness in the ED, no chest pain -- feeling much better today Currently on 2 L oxygen via  SpO2 at 94-98% Troponin 775 > 5,148 > 5,348 > 6,425 > 5,851 > 3,410 Echo from 12/2022: EF 55-60%, moderate LVH, mild RV dysfunction, flattened intraventricular septum, trivial MR Ordered lipid panel -- will add on  Lipitor 40 mg daily    Continue IV heparin  Continue ASA 81 mg daily  Continue Lopressor 25 mg BID  NPO since midnight -- scheduled for RHC/LHC at 12 pm today    Hypomagnesemia Mag 1.6 today Supplementation ordered    Per primary Sepsis and acute hypoxic failure 2/2 CAP CKD stage 3b Anxiety Depression Insomnia Chronic pain Vascular dementia  Hypertension    For questions or updates, please contact [facility] Please consult [email or web address] for contact info under        Signed, Olena Leatherwood, PA-C   ATTENDING ATTESTATION:  After conducting a review of all available clinical information with the care team, interviewing the patient, and performing a physical exam, I agree with the findings and plan described in this note.   GEN: No acute distress.   HEENT:  MMM, no JVD, no scleral icterus Cardiac: RRR, no murmurs, rubs, or gallops.  Respiratory: Clear to auscultation bilaterally. GI: Soft, nontender, non-distended  MS: No edema; No deformity. Neuro:  Nonfocal  Vasc:  +2 radial pulses  Patient is improved today, feels less dyspneic.  Reamins afebrile and non-toxic.   Did not sleep  well due to constant noise.  NPO for planned cor angio/PCI + RHC (due to elevated PA pressures and RV dilatation/dysfunction on recent TTE).  Continue IV heparin, ASA, lopressor 25mg  BID.  Cr at baseline ~1.9.  R radial site appropriate, I asked nursing to obtain R A/C IV for RHC.  I have reviewed the risks, indications, and alternatives to cardiac catheterization, possible angioplasty, and stenting with the patient. Risks include but are not limited to bleeding, infection, vascular injury, stroke, myocardial infection, arrhythmia, kidney injury, radiation-related injury in the case of prolonged fluoroscopy use, emergency cardiac surgery, and death. The patient understands the risks of serious complication is 1-2 in 1000 with diagnostic cardiac cath and 1-2% or less with  angioplasty/stenting.    Alverda Skeans, MD Pager [phone]

## 2024-01-29 NOTE — Progress Notes (Addendum)
 Progress Note   Patient: Jerry [name]. VHQ:[id number] DOB: [birth year] DOA: 01/27/2024     2 DOS: the patient was seen and examined on 01/29/2024   Brief hospital course: Jasdeep Kepner. is a 83 y.o. 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  Patient had been having progressive shortness of breath for weeks associated with cough, difficulty standing due to fatigue and weakness.  EMS was called in.  Patient was noted to be hypoxic and was brought into the ED.  Patient was noted to be febrile but hemodynamically stable.  Initial labs showed WBC elevated at 24.5.  Creatinine was 2.4 from baseline around 2.  AST and ALT elevated with normal total bilirubin.  Troponin was elevated at [phone].  Lactic acid elevated at 3.3.  Chest x-ray done showed patchy left basilar consolidation.  CT chest showed patchy airspace opacity in the left upper and lower lobe compatible with pneumonia.  Cardiology was consulted from the ED, was started on heparin drip and was admitted to hospital for further evaluation and treatment.  3/17: Hemodynamically stable, labs with hypomagnesemia which is being repleted, improving leukocytosis but significant decrease in hemoglobin without any obvious bleeding.  Anemia panel consistent with anemia of chronic disease and some iron deficiency.   Assessment/Plan  Sepsis secondary to pneumonia Acute hypoxic and hypercarbic resp failure secondary to left-sided community-acquired pneumonia. Patient was febrile tachypneic hypoxic on presentation with leukocytosis and elevated creatinine on presentation with chest infiltrates suggestive of sepsis.  CT scan of the chest also showed pulmonary infiltrates.  Lactic acidosis has been improved.  Weaned off from BiPAP and currently on 3 L of oxygen with no baseline  use. -Continue IV ceftriaxone and Zithromax-will complete a 5-day course  NSTEMI - Patient found to have troponin 700, 5000 patient with exertional shortness of breath without any chest pain.  Likely secondary to demand ischemia from pneumonia, COPD exacerbation, AKI and sepsis..    Continue metoprolol.  Duplex ultrasound of the lower extremity without any DVT.  2D echocardiogram with LV ejection fraction of 55 to 60%.  Cardiology was consulted and patient was started on heparin infusion -Going for cardiac catheterization today -Follow-up post cath recommendations  CKD stage IIIb.  Creatinine seems to be stable around baseline. -Monitor renal function -Avoid nephrotoxins  Microcytic anemia.  Significant decrease in hemoglobin today without any obvious bleeding.  Hemoglobin at 9.6.  Patient is on heparin infusion.  Anemia panel consistent with anemia of chronic disease and some iron deficiency. -Starting on iron supplement -Monitor hemoglobin -Transfuse if below 8 due to recent NSTEMI   Generalized anxiety disorder-continue Xanax   Essential hypertension-continue amlodipine, hydrochlorothiazide    Depression-continue Lexapro   Insomnia-continue Zyprexa   Chronic pain-placed on as needed oxycodone   Vascular dementia- continue to monitor.     Subjective: Patient was seen and examined today.  Denies any chest pain or shortness of breath.  No baseline oxygen use.  Multiple family members at bedside.  Awaiting cardiac catheterization.  Physical Exam: Vitals:   01/28/24 2300 01/28/24 2315 01/29/24 0015 01/29/24 0400  BP:   105/63 118/71  Pulse:  97 94   Resp:  (!) 22 (!) 24   Temp:   99.4 F (37.4 C) 98.9 F (37.2 C)  TempSrc:   Oral Oral  SpO2:  95% 91%   Weight: 72.5 kg  Height:       General.  Frail elderly man, in no acute distress. Pulmonary.  Lungs clear bilaterally, normal respiratory effort. CV.  Regular rate and rhythm, no JVD, rub or murmur. Abdomen.  Soft,  nontender, nondistended, BS positive. CNS.  Alert and oriented .  No focal neurologic deficit. Extremities.  No edema, no cyanosis, pulses intact and symmetrical. Psychiatry.  Judgment and insight appears normal.   Data Reviewed: Prior data reviewed  Family Communication: Multiple family members at bedside  Disposition: Status is: Inpatient Remains inpatient appropriate because: Severity of illness  Planned Discharge Destination: Home with Home Health  DVT prophylaxis.  Heparin infusion Time spent: 50 minutes  This record has been created using Conservation officer, historic buildings. Errors have been sought and corrected,but may not always be located. Such creation errors do not reflect on the standard of care.   Author: Arnetha Courser, MD 01/29/2024 12:48 PM  For on call review [email or web address].

## 2024-01-29 NOTE — Progress Notes (Signed)
 PHARMACY - ANTICOAGULATION CONSULT NOTE  Pharmacy Consult for IV heparin Indication: chest pain/ACS  No Known Allergies  Patient Measurements: Height: 6' (182.9 cm) Weight: 72.5 kg (159 lb 13.3 oz) IBW/kg (Calculated) : 77.6 Heparin Dosing Weight: 72.6 kg  Vital Signs: Temp: 98.9 F (37.2 C) (03/18 0400) Temp Source: Oral (03/18 0400) BP: 118/71 (03/18 0400) Pulse Rate: 94 (03/18 0015)  Labs: Recent Labs    01/27/24 1613 01/27/24 1633 01/28/24 0158 01/28/24 0430 01/28/24 0432 01/28/24 0718 01/28/24 0719 01/28/24 1412 01/28/24 1756 01/29/24 0446 01/29/24 0817  HGB 11.1*   < >  --   --  10.6*  --  11.6*  --   --  9.6*  --   HCT 40.6   < >  --   --  37.6*  --  34.0*  --   --  33.2*  --   PLT 233  --   --   --  210  --   --   --   --  193  --   HEPARINUNFRC  --   --   --   --   --  <0.10*  --   --  0.15*  --  0.25*  CREATININE 2.41*  --  1.92*  --   --   --   --   --   --  1.94*  --   TROPONINIHS 775*   < > 5,348* 6,425*  --  5,851*  --  3,410*  --   --   --    < > = values in this interval not displayed.    Estimated Creatinine Clearance: 31.1 mL/min (A) (by C-G formula based on SCr of 1.94 mg/dL (H)).   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 is a 83 y.o. year old male admitted on 01/27/2024 with concern for ACS. Trop 775 > 5148. No PE noted on CTA.  No anticoagulation prior to admission. Pharmacy consulted to dose heparin.  -heparin level 0.25 on  1050 units/hr -hg 11.6> 9.6 (has been 10.6-14.3 since admit) -plans noted for cath today  Goal of Therapy:  Heparin level 0.3-0.7 units/ml Monitor platelets by anticoagulation protocol: Yes   Plan:  -Increase heparin to 1200 units/hr -Will follow plans post cath  Harland German, PharmD Clinical Pharmacist **Pharmacist phone directory can now be found on [email or web address] ([email or web address]).  Listed under [facility].

## 2024-01-30 ENCOUNTER — Encounter (HOSPITAL_COMMUNITY): Payer: Self-pay | Admitting: Cardiology

## 2024-01-30 DIAGNOSIS — I21A1 Myocardial infarction type 2: Secondary | ICD-10-CM

## 2024-01-30 DIAGNOSIS — R0602 Shortness of breath: Secondary | ICD-10-CM | POA: Diagnosis not present

## 2024-01-30 LAB — COMPREHENSIVE METABOLIC PANEL
ALT: 85 U/L — ABNORMAL HIGH (ref 0–44)
AST: 19 U/L (ref 15–41)
Albumin: 2.4 g/dL — ABNORMAL LOW (ref 3.5–5.0)
Alkaline Phosphatase: 50 U/L (ref 38–126)
Anion gap: 5 (ref 5–15)
BUN: 46 mg/dL — ABNORMAL HIGH (ref 8–23)
CO2: 24 mmol/L (ref 22–32)
Calcium: 8.6 mg/dL — ABNORMAL LOW (ref 8.9–10.3)
Chloride: 110 mmol/L (ref 98–111)
Creatinine, Ser: 1.95 mg/dL — ABNORMAL HIGH (ref 0.61–1.24)
GFR, Estimated: 34 mL/min — ABNORMAL LOW (ref >60)
Glucose, Bld: 106 mg/dL — ABNORMAL HIGH (ref 70–99)
Potassium: 4.3 mmol/L (ref 3.5–5.1)
Sodium: 139 mmol/L (ref 135–145)
Total Bilirubin: 0.5 mg/dL (ref 0.0–1.2)
Total Protein: 5.1 g/dL — ABNORMAL LOW (ref 6.5–8.1)

## 2024-01-30 LAB — CBC
HCT: 29.4 % — ABNORMAL LOW (ref 39.0–52.0)
Hemoglobin: 8.5 g/dL — ABNORMAL LOW (ref 13.0–17.0)
MCH: 21.5 pg — ABNORMAL LOW (ref 26.0–34.0)
MCHC: 28.9 g/dL — ABNORMAL LOW (ref 30.0–36.0)
MCV: 74.2 fL — ABNORMAL LOW (ref 80.0–100.0)
Platelets: 164 10*3/uL (ref 150–400)
RBC: 3.96 MIL/uL — ABNORMAL LOW (ref 4.22–5.81)
RDW: 20.7 % — ABNORMAL HIGH (ref 11.5–15.5)
WBC: 16.5 10*3/uL — ABNORMAL HIGH (ref 4.0–10.5)
nRBC: 0 % (ref 0.0–0.2)

## 2024-01-30 LAB — MAGNESIUM: Magnesium: 2 mg/dL (ref 1.7–2.4)

## 2024-01-30 NOTE — Progress Notes (Addendum)
 Patient Name: Jerry [name]. Date of Encounter: 01/30/2024 [facility] Cardiologist: Elder Negus, MD   Interval Summary  .    Patient resting in the bed Currently on 3 L oxygen via [place] He states that the only symptom he is currently having is being short of breath when doing any exertional activity He denies any orthopnea -- stating he lays completely flat when sleeping at night   Vital Signs .    Vitals:   01/29/24 2043 01/29/24 2112 01/29/24 2325 01/30/24 0803  BP:   134/79 125/70  Pulse: 85  95 84  Resp: (!) 21  20 19   Temp:   98.9 F (37.2 C) 97.8 F (36.6 C)  TempSrc:   Oral Oral  SpO2:  96% 94% 91%  Weight:      Height:       Intake/Output Summary (Last 24 hours) at 01/30/2024 0939 Last data filed at 01/30/2024 0807 Gross per 24 hour  Intake 754.31 ml  Output 1400 ml  Net -645.69 ml      01/28/2024   11:00 PM 01/27/2024    3:16 PM 12/22/2023    5:10 AM  Last 3 Weights  Weight (lbs) 159 lb 13.3 oz 160 lb 160 lb  Weight (kg) 72.5 kg 72.576 kg 72.576 kg     Telemetry/ECG    Sinus rhythm,  HR 70-80s - Personally Reviewed  Physical Exam .   GEN: No acute distress, on 3 L oxygen via [place] with SpO2 93-96%.   Neck: No JVD Cardiac: RRR, no murmurs, rubs, or gallops.  Respiratory: Clear to auscultation bilaterally. GI: Soft, nontender, non-distended  MS: No edema  Assessment & Plan .   [name]. 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, vascular dementia who was being seen for NSTEMI    NSTEMI, type 2 Mild nonobstructive CAD 01/29/2024: HDL 61; LDL Cholesterol 39 01/30/2024: ALT 85 LPA pending result  Continue ASA 81 mg daily Continue Lopressor 25 mg BID Stopped Lipitor due to elevated ALT and normal lipid panel -- can re-check lipid panel in 4-6 weeks and then assess need for statin   Hypertension Most recent BP 125/70 Continue Lopressor 25 mg  BID Continue amlodipine 10 mg at bedtime Continue hydrochlorothiazide 12.5 mg daily   Moderate pulmonary hypertension, 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 RHC 3/18 showed mean PAP 40 mmHg, mean PCWP 19 mmHg  Patient currently on 3 L of oxygen via [place] SpO2 is 93-96%  Still reporting significant shortness of breath, especially with exertion -- he states this has been new since his PNA diagnosis  Patient appears euvolemic on exam and does not have any orthopnea Attempt to wean off oxygen, as tolerated, either prior to discharge or as an outpatient -- managed per primary   Per primary Sepsis and acute hypoxic failure 2/2 CAP CKD stage 3b Anxiety Depression Insomnia Chronic pain Vascular dementia  Hypertension   For questions or updates, please contact [facility] Please consult [email or web address] for contact info under        Signed, Olena Leatherwood, PA-C   Patient seen, examined. Available data reviewed. Agree with findings, assessment, and plan as outlined by Evlyn Clines, PA-C.  The patient is independently interviewed and examined.  He is alert, oriented, elderly male in no distress on 3 L of oxygen per nasal cannula.  HEENT is normal, JVP is normal, lungs are  clear to auscultation bilaterally, heart is distant and regular with no murmur gallop, abdomen is soft and nontender with no masses, extremities have no edema.  Right radial cath site is clear with no hematoma or ecchymosis.  Telemetry is reviewed and demonstrates normal sinus rhythm with heart rate in the 70s.  Patient's cardiac catheterization data is reviewed and demonstrates mild nonobstructive CAD, LVEDP 18 mmHg, wedge pressure 19 mmHg, moderate pulmonary hypertension with mean PA pressure 40 mmHg.  Echo demonstrated flattening of the interventricular septum suggestive of pulmonary hypertension with preserved LVEF.  There is no significant valvular disease.  I agree with the patient's medical program which includes a beta-blocker,  low-dose aspirin, antihypertensive therapy as outlined above.  I agree with discontinuation of his statin drug in the setting of only mild nonobstructive CAD at 83 years old with elevated transaminases.  This can be readdressed in the outpatient setting and risk/benefit can be evaluated at that time.  The patient's primary symptoms appear to be related to pneumonia.  In reviewing his case, it appears that he had a type II MI from demand ischemia in the setting of pneumonia, sepsis, and acute kidney injury.  Defer further management to the primary medicine team.  Please call if we can be of any further assistance in his care.  Tonny Bollman, M.D. 01/30/2024 9:45 AM

## 2024-01-30 NOTE — Care Management Important Message (Signed)
 Important Message  Patient Details  Name: Jerry [name]. MRN: [id number] Date of Birth: Dec 02, 1940   Important Message Given:  Yes - Medicare IM     Dorena Bodo 01/30/2024, 1:12 PM

## 2024-01-30 NOTE — Progress Notes (Signed)
 PROGRESS NOTE Jerry [name].  NWG:[id number] DOB: May 31, 1941 DOA: 01/27/2024 PCP: [name], MD  Brief Narrative/Hospital Course: 57 YOM w/ HTN, prostate cancer status post radical cystoprostatectomy with ileal conduit, hyperlipidemia, vascular dementia presented to hospital with shortness of breath dyspnea  w/ progressive dyspnea for weeks, fatigue and found to be hypoxic, febrile w/leukocytosis 24.5 ceat 2.4  b/l ~2  AST and ALT elevated with normal total bilirubin.  Troponin was elevated at [phone].  Lactic acid elevated at 3.3., CXR-patchy left basilar consolidation,CT chest showed PNA LUL/LLL.  Patient was placed on heparin drip, IV antibiotics and admitted for sepsis/pneumonia/respiratory failure/NSTEMI. 3/18> Drop in hemoglobin without any obvious bleeding.  Anemia panel consistent with anemia of chronic disease and some iron deficiency.  Consultation: Cardiology  Procedure: Cardiac cath 3/18: Mild nonobstructive CAD     Subjective: Patient seen and examined this morning.  Resting comfortably no chest pain on 3 L nasal cannula oxygen not on home oxygen Overnight afebrile,BP stable.  Creatinine about the same at 1.9, hemoglobin 8.5 from 9.6>10.9 thank, WBC further down 22>19>16 Patient was wheezing short of breath given nebulizer overnight.   Assessment and Plan: Principal Problem:   Shortness of breath Active Problems:   OSA (obstructive sleep apnea)   Asthma, mild intermittent   Benign essential hypertension   Mixed hyperlipidemia   Type 2 MI (myocardial infarction) (HCC)    Sepsis POA due to pneumonia  Acute hypoxic and hypercapnic respiratory failure in the setting of sepsis and pneumonia: Patient febrile tachycardic tachypneic with leukocytosis lactic acidosis on admission.  Managing with ceftriaxone azithromycin to complete 5 days course, continue supplement oxygen and wean as tolerated, continue prn bronchodilators.  PT OT I-S  NSTEMI-type II MI from demand  ischemia Coronary calcification on CT Dyspnea on exertion/dizziness Troponin peaked from 775>>[phone]>>3410: Cardiology following managed for NSTEMI - s/p cath 3/18-mild nonobstructive CAD.  Continue risk factor modification aspirin 81 blood pressure and discontinued statin-reassess as outpatient.  HTN : BP stable on amlodipine 10, HCTZ 12.5, Lopressor 25  BID  CKD stage IIIb: Renal function at baseline monitor Recent Labs    06/04/23 1612 06/26/23 1546 06/27/23 0933 06/28/23 0105 12/22/23 0620 12/27/23 1106 01/27/24 1613 01/27/24 1633 01/28/24 0158 01/28/24 0719 01/29/24 0446 01/29/24 1459 01/29/24 1503 01/30/24 0252  BUN 31* 43* 36* 32* 40* 31* 53*  --  47*  --  52*  --   --  46*  CREATININE 1.99* 2.22* 1.98* 1.79* 2.10* 1.96* 2.41*  --  1.92*  --  1.94*  --   --  1.95*  CO2 23 24 26 24 22 21 22   --  19*  --  22  --   --  24  K 3.9 5.2* 4.5 4.8 5.6* 4.9 5.1   < > 5.4* 5.2* 5.0 4.6  4.7 4.5 4.3   < > = values in this interval not displayed.    Chronic microcytic anemia Of note she does have baseline chronic anemia with hemoglobin up to 7 8 on 06/28/23, 8.7 on 06/26/24. On admission hide elevated likely from hemoconcentration given AKI- has drop in hemoglobin without obvious bleeding while on heparin,anemia panel consistent with anemia of chronic disease and some iron deficiency.  Continue iron supplement, trend hemoglobin transfuse less than 8 g Recent Labs  Lab 01/28/24 0719 01/29/24 0446 01/29/24 1459 01/29/24 1503 01/30/24 0252  HGB 11.6* 9.6* 10.9*  10.9* 10.9* 8.5*  HCT 34.0* 33.2* 32.0*  32.0* 32.0* 29.4*    GAD Depression Insomnia:  Continue Xanax, Lexapro Zyprexa   Chronic pain-placed on as needed oxycodone   Vascular dementia- continue to monitor.     DVT prophylaxis: enoxaparin (LOVENOX) injection 30 mg Start: 01/30/24 0800 SCDs Start: 01/27/24 2226 Code Status:   Code Status: Full Code Family Communication: plan of care discussed with patient/none  at bedside. Patient status is: Remains hospitalized because of severity of illness Level of care: Progressive Cardiac   Dispo: The patient is from: home w/ wife            Anticipated disposition: TBD.  Obtain PT OT evaluation Objective: Vitals last 24 hrs: Vitals:   01/29/24 2112 01/29/24 2325 01/30/24 0803 01/30/24 1206  BP:  134/79 125/70 (!) 128/58  Pulse:  95 84 91  Resp:  20 19 18   Temp:  98.9 F (37.2 C) 97.8 F (36.6 C) 97.9 F (36.6 C)  TempSrc:  Oral Oral Axillary  SpO2: 96% 94% 91% 94%  Weight:      Height:       Weight change:   Physical Examination: General exam: alert awake,at baseline, older than stated age HEENT:Oral mucosa moist, Ear/Nose WNL grossly Respiratory system: Bilaterally diminished breath sounds with mild wheezing,no use of accessory muscle Cardiovascular system: S1 & S2 +, No JVD. Gastrointestinal system: Abdomen soft,NT,ND, BS+ Nervous System: Alert, awake, moving all extremities,and following commands. Extremities: LE edema neg,distal peripheral pulses palpable and warm.  Skin: No rashes,no icterus. MSK: Normal muscle bulk,tone, power   Medications reviewed:  Scheduled Meds:  amLODipine  10 mg Oral QHS   aspirin EC  81 mg Oral Daily   azithromycin  500 mg Oral QHS   enoxaparin (LOVENOX) injection  30 mg Subcutaneous Q24H   escitalopram  5 mg Oral Daily   ferrous sulfate  325 mg Oral Q breakfast   hydrochlorothiazide  12.5 mg Oral Daily   metoprolol tartrate  25 mg Oral BID   mometasone-formoterol  2 puff Inhalation BID   sodium chloride flush  3 mL Intravenous Q12H   Continuous Infusions:  sodium chloride Stopped (01/29/24 1955)   sodium chloride     cefTRIAXone (ROCEPHIN)  IV 2 g (01/29/24 2324)    Diet Order             Diet Heart Room service appropriate? Yes; Fluid consistency: Thin  Diet effective now                  Intake/Output Summary (Last 24 hours) at 01/30/2024 1302 Last data filed at 01/30/2024 1026 Gross per  24 hour  Intake 1234.31 ml  Output 1400 ml  Net -165.69 ml   Net IO Since Admission: -1,299.52 mL [01/30/24 1302]  Wt Readings from Last 3 Encounters:  01/28/24 72.5 kg  12/22/23 72.6 kg  12/13/23 79.5 kg     Unresulted Labs (From admission, onward)     Start     Ordered   02/05/24 0500  Creatinine, serum  (enoxaparin (LOVENOX)    CrCl < 30 ml/min)  Once,   R       Comments: while on enoxaparin therapy.    01/29/24 1532   01/30/24 0500  Lipoprotein A (LPA)  Tomorrow morning,   R        01/29/24 1532   01/29/24 0500  Comprehensive metabolic panel  Daily,   R      01/28/24 1201   01/28/24 0700  Blood gas, arterial  Once,   R        01/28/24 9604  01/28/24 0500  CBC  Daily,   R      01/27/24 2248          Data Reviewed: I have personally reviewed following labs and imaging studies CBC: Recent Labs  Lab 01/27/24 1613 01/27/24 1633 01/28/24 0432 01/28/24 0719 01/29/24 0446 01/29/24 1459 01/29/24 1503 01/30/24 0252  WBC 24.5*  --  22.4*  --  19.5*  --   --  16.5*  NEUTROABS 22.1*  --   --   --   --   --   --   --   HGB 11.1*   < > 10.6* 11.6* 9.6* 10.9*  10.9* 10.9* 8.5*  HCT 40.6   < > 37.6* 34.0* 33.2* 32.0*  32.0* 32.0* 29.4*  MCV 77.3*  --  74.8*  --  74.4*  --   --  74.2*  PLT 233  --  210  --  193  --   --  164   < > = values in this interval not displayed.   Basic Metabolic Panel:  Recent Labs  Lab 01/27/24 1613 01/27/24 1633 01/28/24 0158 01/28/24 0719 01/29/24 0446 01/29/24 1459 01/29/24 1503 01/30/24 0252  NA 145   < > 142 141 142 143  144 140 139  K 5.1   < > 5.4* 5.2* 5.0 4.6  4.7 4.5 4.3  CL 109  --  114*  --  110  --   --  110  CO2 22  --  19*  --  22  --   --  24  GLUCOSE 140*  --  105*  --  127*  --   --  106*  BUN 53*  --  47*  --  52*  --   --  46*  CREATININE 2.41*  --  1.92*  --  1.94*  --   --  1.95*  CALCIUM 9.3  --  7.2*  --  8.8*  --   --  8.6*  MG  --   --   --   --  1.6*  --   --  2.0   < > = values in this interval not  displayed.   GFR: Estimated Creatinine Clearance: 31 mL/min (A) (by C-G formula based on SCr of 1.95 mg/dL (H)). Liver Function Tests:  Recent Labs  Lab 01/27/24 1613 01/28/24 0158 01/29/24 0446 01/30/24 0252  AST 375* 166* 34 19  ALT 301* 196* 128* 85*  ALKPHOS 98 62 53 50  BILITOT 0.6 1.0 0.7 0.5  PROT 6.5 4.8* 5.4* 5.1*  ALBUMIN 3.4* 2.5* 2.6* 2.4*   Recent Labs    01/29/24 0446  CHOL 119  HDL 61  LDLCALC 39  TRIG 94  CHOLHDL 2.0  Sepsis Labs: Recent Labs  Lab 01/28/24 0158 01/28/24 0430 01/28/24 0718 01/28/24 1412  LATICACIDVEN 2.2* 3.3* 0.9 1.9   Recent Results (from the past 240 hours)  Resp panel by RT-PCR (RSV, Flu A&B, Covid) Anterior Nasal Swab     Status: None   Collection Time: 01/27/24  4:13 PM   Specimen: Anterior Nasal Swab  Result Value Ref Range Status   SARS Coronavirus 2 by RT PCR NEGATIVE NEGATIVE Final   Influenza A by PCR NEGATIVE NEGATIVE Final   Influenza B by PCR NEGATIVE NEGATIVE Final    Comment: (NOTE) The Xpert Xpress SARS-CoV-2/FLU/RSV plus assay is intended as an aid in the diagnosis of influenza from Nasopharyngeal swab specimens and should not be used as a sole basis  for treatment. Nasal washings and aspirates are unacceptable for Xpert Xpress SARS-CoV-2/FLU/RSV testing.  Fact Sheet for Patients: BloggerCourse.com  Fact Sheet for Healthcare Providers: SeriousBroker.it  This test is not yet approved or cleared by the [place] FDA and has been authorized for detection and/or diagnosis of SARS-CoV-2 by FDA under an Emergency Use Authorization (EUA). This EUA will remain in effect (meaning this test can be used) for the duration of the COVID-19 declaration under Section 564(b)(1) of the Act, 21 U.S.C. section 360bbb-3(b)(1), unless the authorization is terminated or revoked.     Resp Syncytial Virus by PCR NEGATIVE NEGATIVE Final    Comment: (NOTE) Fact Sheet for  Patients: BloggerCourse.com  Fact Sheet for Healthcare Providers: SeriousBroker.it  This test is not yet approved or cleared by the [place] FDA and has been authorized for detection and/or diagnosis of SARS-CoV-2 by FDA under an Emergency Use Authorization (EUA). This EUA will remain in effect (meaning this test can be used) for the duration of the COVID-19 declaration under Section 564(b)(1) of the Act, 21 U.S.C. section 360bbb-3(b)(1), unless the authorization is terminated or revoked.  Performed at [facility], [address] [place], [place] [address]   Blood culture (routine x 2)     Status: None (Preliminary result)   Collection Time: 01/27/24  9:00 PM   Specimen: BLOOD RIGHT ARM  Result Value Ref Range Status   Specimen Description BLOOD RIGHT ARM  Final   Special Requests   Final    BOTTLES DRAWN AEROBIC AND ANAEROBIC Blood Culture results may not be optimal due to an inadequate volume of blood received in culture bottles   Culture   Final    NO GROWTH 3 DAYS Performed at [facility], [address] [place], [place] [address]    Report Status PENDING  Incomplete  Blood culture (routine x 2)     Status: None (Preliminary result)   Collection Time: 01/27/24  9:05 PM   Specimen: BLOOD LEFT ARM  Result Value Ref Range Status   Specimen Description BLOOD LEFT ARM  Final   Special Requests   Final    BOTTLES DRAWN AEROBIC AND ANAEROBIC Blood Culture results may not be optimal due to an inadequate volume of blood received in culture bottles   Culture   Final    NO GROWTH 3 DAYS Performed at [facility], [address], [place], [place] [address]    Report Status PENDING  Incomplete  Antimicrobials/Microbiology: Anti-infectives (From admission, onward)    Start     Dose/Rate Route Frequency Ordered Stop   01/28/24 2200  cefTRIAXone (ROCEPHIN) 2 g in sodium chloride 0.9 % 100 mL IVPB        2  g 200 mL/hr over 30 Minutes Intravenous Every 24 hours 01/27/24 2245     01/27/24 2300  azithromycin (ZITHROMAX) tablet 500 mg        500 mg Oral Daily at bedtime 01/27/24 2245     01/27/24 1745  cefTRIAXone (ROCEPHIN) 1 g in sodium chloride 0.9 % 100 mL IVPB        1 g 200 mL/hr over 30 Minutes Intravenous  Once 01/27/24 1732 01/27/24 2154   01/27/24 1745  azithromycin (ZITHROMAX) 500 mg in sodium chloride 0.9 % 250 mL IVPB        500 mg 250 mL/hr over 60 Minutes Intravenous  Once 01/27/24 1732 01/27/24 2317         Component Value Date/Time  SDES BLOOD LEFT ARM 01/27/2024 2105   SPECREQUEST  01/27/2024 2105    BOTTLES DRAWN AEROBIC AND ANAEROBIC Blood Culture results may not be optimal due to an inadequate volume of blood received in culture bottles   CULT  01/27/2024 2105    NO GROWTH 3 DAYS Performed at [facility], [address], [place], [place] [address]    REPTSTATUS PENDING 01/27/2024 2105  Radiology Studies: CARDIAC CATHETERIZATION Result Date: 01/29/2024   Prox RCA lesion is 25% stenosed.   LV end diastolic pressure is mildly elevated.   Hemodynamic findings consistent with moderate pulmonary hypertension. Minimal nonobstructive CAD Mildly elevated LV filling pressures. LVEDP 19 mm Hg. PCWP 24/27 with mean 19 mm Hg Moderate pulmonary HTN. PAP 65/30 with mean 40 mm Hg Cardiac output 5.49 L/min, index 2.83 Plan: medical management. Type 2 NSTEMI   PERIPHERAL VASCULAR CATHETERIZATION Result Date: 01/29/2024   Prox RCA lesion is 25% stenosed.   LV end diastolic pressure is mildly elevated.   Hemodynamic findings consistent with moderate pulmonary hypertension. Minimal nonobstructive CAD Mildly elevated LV filling pressures. LVEDP 19 mm Hg. PCWP 24/27 with mean 19 mm Hg Moderate pulmonary HTN. PAP 65/30 with mean 40 mm Hg Cardiac output 5.49 L/min, index 2.83 Plan: medical management. Type 2 NSTEMI    LOS: 3 days   Total time spent in review of labs and imaging, patient  evaluation, formulation of plan, documentation and communication with family: 35 minutes  Lanae Boast, MD  [facility]  01/30/2024, 1:02 PM

## 2024-01-30 NOTE — Progress Notes (Signed)
 Physical Therapy Evaluation Patient Details Name: Jerry [name]. MRN: [id number] DOB: 07-19-1941 Today's Date: 01/30/2024  History of Present Illness  83 y.o. male presents to [facility] on 01/27/24 with SOB, dyspnea, and fatigue. Pt with patchy L basilar consolidation, sepsis POA 2/2 PNA. Pt also admitted with NSTEMI, s/p cardiac cath 3/18. 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 PTA, pt was ModI with a RW for mobility. In today's session, pt was supervision for bed mobility. Pt need two attempts to stand, however, was able to complete stand with RW and MinA for boost-up. Pt was able to ambulate ~120 ft with RW and CGA with slow, shuffling steps. Pt reported having 24/7 physical assist available upon d/c from the hospital. Pt currently with functional limitations due to the deficits listed below (see PT Problem List). Pt would benefit from acute skilled PT to address functional impairments. Recommending post-acute HHPT to work towards independence with mobility. Acute PT to follow.  On 2L upon arrival, bump to 3L during gait to maintain SpO2 >92%        If plan is discharge home, recommend the following: A little help with walking and/or transfers;A little help with bathing/dressing/bathroom;Assistance with cooking/housework;Assist for transportation;Help with stairs or ramp for entrance   Can travel by private vehicle    Yes    Equipment Recommendations None recommended by PT     Functional Status Assessment Patient has had a recent decline in their functional status and demonstrates the ability to make significant improvements in function in a reasonable and predictable amount of time.     Precautions / Restrictions Precautions Precautions: Fall Precaution/Restrictions Comments: watch  O2 Restrictions Weight Bearing Restrictions Per Provider Order: No      Mobility  Bed Mobility Overal bed mobility: Needs Assistance Bed Mobility: Supine to Sit, Sit to Supine    Supine to sit: HOB elevated, Supervision Sit to supine: Supervision, HOB elevated   General bed mobility comments: supervision for safety, cues to shift hips forward    Transfers Overall transfer level: Needs assistance Equipment used: Rolling walker (2 wheels) Transfers: Sit to/from Stand Sit to Stand: Min assist    General transfer comment: pt needed two attempts before being able to complete stand, MinA for boost up and to steady RW. Cues for hand placement    Ambulation/Gait Ambulation/Gait assistance: Contact guard assist Gait Distance (Feet): 120 Feet Assistive device: Rolling walker (2 wheels) Gait Pattern/deviations: Step-through pattern, Trunk flexed, Narrow base of support, Decreased dorsiflexion - right, Decreased dorsiflexion - left, Decreased step length - right, Decreased step length - left Gait velocity: decr    General Gait Details: short, shuffled steps with a forward flexed trunk. Steady with no LOB    Balance Overall balance assessment: Needs assistance, Mild deficits observed, not formally tested Sitting-balance support: No upper extremity supported, Feet supported Sitting balance-Leahy Scale: Good     Standing balance support: Bilateral upper extremity supported, During functional activity, Reliant on assistive device for balance Standing balance-Leahy Scale: Poor Standing balance comment: reliant on RW       Pertinent Vitals/Pain Pain Assessment Pain Assessment: No/denies pain    Home Living Family/patient expects to be discharged to:: Private residence Living Arrangements: Spouse/significant other Available Help at Discharge: Available 24 hours/day Type of Home: House Home Access: Stairs to enter Entrance Stairs-Rails: Right Entrance  Stairs-Number of Steps: 1    Home Layout: One level Home Equipment: Grab bars - toilet;Shower seat;Grab bars - Chartered loss adjuster (2 wheels)      Prior Function Prior Level of Function : Independent/Modified Independent;Driving  Mobility Comments: walks with a RW ADLs Comments: ind     Extremity/Trunk Assessment   Upper Extremity Assessment Upper Extremity Assessment: Defer to OT evaluation    Lower Extremity Assessment Lower Extremity Assessment: Generalized weakness    Cervical / Trunk Assessment Cervical / Trunk Assessment: Kyphotic  Communication   Communication Communication: No apparent difficulties    Cognition Arousal: Alert Behavior During Therapy: WFL for tasks assessed/performed   PT - Cognitive impairments: History of cognitive impairments    Following commands: Impaired Following commands impaired: Follows one step commands with increased time     Cueing Cueing Techniques: Verbal cues, Tactile cues      PT Assessment Patient needs continued PT services  PT Problem List Decreased strength;Decreased activity tolerance;Decreased balance;Decreased mobility;Cardiopulmonary status limiting activity       PT Treatment Interventions DME instruction;Gait training;Stair training;Functional mobility training;Therapeutic activities;Therapeutic exercise;Balance training;Neuromuscular re-education;Patient/family education    PT Goals (Current goals can be found in the Care Plan section)  Acute Rehab PT Goals Patient Stated Goal: to get better PT Goal Formulation: With patient Time For Goal Achievement: 02/13/24 Potential to Achieve Goals: Good    Frequency Min 2X/week        AM-PAC PT "6 Clicks" Mobility  Outcome Measure Help needed turning from your back to your side while in a flat bed without using bedrails?: A Little Help needed moving from lying on your back to sitting on the side of a flat bed without using bedrails?: A Little Help needed moving to and from a bed to a  chair (including a wheelchair)?: A Little Help needed standing up from a chair using your arms (e.g., wheelchair or bedside chair)?: A Little Help needed to walk in hospital room?: A Little Help needed climbing 3-5 steps with a railing? : A Lot 6 Click Score: 17    End of Session Equipment Utilized During Treatment: Gait belt;Oxygen Activity Tolerance: Patient tolerated treatment well Patient left: in bed;with call bell/phone within reach Nurse Communication: Mobility status;Other (comment) PT Visit Diagnosis: Unsteadiness on feet (R26.81);Other abnormalities of gait and mobility (R26.89);Muscle weakness (generalized) (M62.81)    Time: 8413-2440 PT Time Calculation (min) (ACUTE ONLY): 25 min   Charges:   PT Evaluation $PT Eval Low Complexity: 1 Low PT Treatments $Gait Training: 8-22 mins PT General Charges $$ ACUTE PT VISIT: 1 Visit       Hilton Cork, PT, DPT Secure Chat Preferred  Rehab Office [phone]   Arturo Morton Brion Aliment 01/30/2024, 2:02 PM

## 2024-01-30 NOTE — TOC Initial Note (Addendum)
 Transition of Care (TOC) - Initial/Assessment Note    Patient Details  Name: Jerry [name]. MRN: [id number] Date of Birth: 11-10-1941  Transition of Care [facility]) CM/SW Contact:    Lawerance Sabal, RN Phone Number: 01/30/2024, 8:00 AM  Clinical Narrative:                  Patient admitted from, lives w wife, for NSTEMI.  Anticipate need for [facility] nurse, possibly PT OT- evals pending.  Recent HH services w [facility].   Spoke w patient's wife to discuss plans for DC. She would like to resume services with [facility], I have notified liaison. Patient has RW at home, watching for home O2 needs.    Patient will [facility] PT OT order and face to face.   Expected Discharge Plan: Home w Home Health Services Barriers to Discharge: Continued Medical Work up   Patient Goals and CMS Choice            Expected Discharge Plan and Services   Discharge Planning Services: CM Consult                                          Prior Living Arrangements/Services   Lives with:: Spouse              Current home services: DME (cane, RW, grab bars, 3/1)    Activities of Daily Living   ADL Screening (condition at time of admission) Independently performs ADLs?: No Does the patient have a NEW difficulty with bathing/dressing/toileting/self-feeding that is expected to last >3 days?: No Does the patient have a NEW difficulty with getting in/out of bed, walking, or climbing stairs that is expected to last >3 days?: No Does the patient have a NEW difficulty with communication that is expected to last >3 days?: No Is the patient deaf or have difficulty hearing?: No Does the patient have difficulty seeing, even when wearing glasses/contacts?: No Does the patient have difficulty concentrating, remembering, or making decisions?: No  Permission Sought/Granted                  Emotional Assessment              Admission diagnosis:  Shortness of breath [R06.02] Elevated troponin  [R79.89] Acute respiratory failure with hypercapnia (HCC) [J96.02] Community acquired pneumonia, unspecified laterality [J18.9] Sepsis with acute renal failure without septic shock, due to unspecified organism, unspecified acute renal failure type (HCC) [A41.9, R65.20, N17.9] Patient Active Problem List   Diagnosis Date Noted   Shortness of breath 01/27/2024   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) 01/22/2024   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 12/21/2023   Complication of Ileal conduit (HCC) 12/21/2023   Bilateral leg edema 12/13/2023   Screening for diabetes mellitus 12/13/2023   Malnutrition of moderate degree 06/28/2023   CAP (community acquired pneumonia) 06/26/2023   History of malignant neoplasm of bladder 07/21/2021   Allergic rhinitis 07/21/2021   Asthma 07/21/2021   Benign essential hypertension 07/21/2021   Degenerative cervical spinal stenosis 07/21/2021   Mixed hyperlipidemia 07/21/2021   Tremor 07/21/2021   Vitamin D deficiency 07/21/2021   OSA (obstructive sleep apnea) 07/12/2021   Asthma, mild intermittent    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   Syncope 04/17/2015   Anemia 04/17/2015   Hyperglycemia 04/17/2015   Renal insufficiency 04/17/2015   Faintness  PCP:  [name], MD Pharmacy:   [id number] [place], [place] - [phone] [place] [address] Phone: [phone] Fax: [phone]     Social Drivers of Health (SDOH) Social History: SDOH Screenings   Food Insecurity: No Food Insecurity (01/28/2024)  Housing: Low Risk  (01/28/2024)  Transportation Needs: No Transportation Needs (01/28/2024)  Utilities: Not At Risk (01/28/2024)  Social Connections: Socially Integrated (01/28/2024)  Tobacco Use: Low Risk  (01/27/2024)  Recent Concern: Tobacco Use - Medium Risk (10/31/2023)   Received from [facility]   SDOH Interventions:     Readmission Risk Interventions     06/27/2023   12:12 PM  Readmission Risk Prevention Plan  Transportation Screening Complete  PCP or Specialist Appt within 5-7 Days Complete  Home Care Screening Complete  Medication Review (RN CM) Complete

## 2024-01-30 NOTE — Progress Notes (Signed)
   01/30/24 2248  BiPAP/CPAP/SIPAP  Reason BIPAP/CPAP not in use Other(comment) (BiPAP available if needed.  Patient currently on [place] no resp. distress noted.)  BiPAP/CPAP /SiPAP Vitals  Pulse Rate 93  Resp 18  SpO2 93 %  Bilateral Breath Sounds Clear;Diminished  MEWS Score/Color  MEWS Score 0  MEWS Score Color Chilton Si

## 2024-01-30 NOTE — Evaluation (Signed)
 Occupational Therapy Evaluation Patient Details Name: Jerry [name]. MRN: [id number] DOB: January 26, 1941 Today's Date: 01/30/2024   History of Present Illness   83 y.o. male presents to [facility] on 01/27/24 with SOB, dyspnea, and fatigue. Pt with patchy L basilar consolidation, sepsis POA 2/2 PNA. Pt also admitted with NSTEMI, s/p cardiac cath 3/18. 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 Pt with known dementia with no family available to confirm PLOF. Pt requiring encouragement for EOB/OOB with OT. Min assist for sit to stand with cues for hand placement. Ambulated to sink with CGA and washed hands. Pt requires set up to moderate assistance for ADLs. SpO2 93% on 3L O2 throughout session. Recommending HHOT if pt has 24 hour care at home. Will follow acutely.      If plan is discharge home, recommend the following:   A little help with walking and/or transfers;A lot of help with bathing/dressing/bathroom;Assistance with cooking/housework;Direct supervision/assist for medications management;Direct supervision/assist for financial management;Assist for transportation;Help with stairs or ramp for entrance     Functional Status Assessment   Patient has had a recent decline in their functional status and demonstrates the ability to make significant improvements in function in a reasonable and predictable amount of time.     Equipment Recommendations   None recommended by OT     Recommendations for Other Services         Precautions/Restrictions   Precautions Precautions: Fall Precaution/Restrictions Comments: watch O2 Restrictions Weight Bearing Restrictions Per Provider Order: No     Mobility Bed Mobility Overal bed mobility: Needs Assistance Bed Mobility: Supine to Sit, Sit  to Supine     Supine to sit: HOB elevated, Supervision Sit to supine: Supervision, HOB elevated        Transfers Overall transfer level: Needs assistance Equipment used: Rolling walker (2 wheels) Transfers: Sit to/from Stand Sit to Stand: Min assist           General transfer comment: assist to rise and steady, cues for hand placement      Balance Overall balance assessment: Needs assistance, Mild deficits observed, not formally tested   Sitting balance-Leahy Scale: Good     Standing balance support: Bilateral upper extremity supported, During functional activity, Reliant on assistive device for balance Standing balance-Leahy Scale: Poor Standing balance comment: reliant on RW, can release at sink in static standing to wash hands                           ADL either performed or assessed with clinical judgement   ADL Overall ADL's : Needs assistance/impaired Eating/Feeding: Set up;Bed level   Grooming: Wash/dry hands;Standing;Contact guard assist   Upper Body Bathing: Minimal assistance;Sitting   Lower Body Bathing: Sit to/from stand;Minimal assistance   Upper Body Dressing : Minimal assistance;Sitting   Lower Body Dressing: Moderate assistance;Sit to/from stand   Toilet Transfer: Contact guard assist;Ambulation;Rolling walker (2 wheels)           Functional mobility during ADLs: Contact guard assist;Rolling walker (2 wheels)       Vision Ability to See in Adequate Light: 0 Adequate Patient Visual Report: No change from baseline       Perception         Praxis  Pertinent Vitals/Pain Pain Assessment Pain Assessment: No/denies pain     Extremity/Trunk Assessment Upper Extremity Assessment Upper Extremity Assessment: Overall WFL for tasks assessed   Lower Extremity Assessment Lower Extremity Assessment: Defer to PT evaluation   Cervical / Trunk Assessment Cervical / Trunk Assessment: Kyphotic   Communication  Communication Communication: No apparent difficulties   Cognition Arousal: Alert Behavior During Therapy: WFL for tasks assessed/performed Cognition: No family/caregiver present to determine baseline             OT - Cognition Comments: pt with history of vascular dementia, needs encouragement to participate                 Following commands: Impaired Following commands impaired: Follows one step commands with increased time     Cueing  General Comments          Exercises     Shoulder Instructions      Home Living Family/patient expects to be discharged to:: Private residence Living Arrangements: Spouse/significant other Available Help at Discharge: Available 24 hours/day Type of Home: House Home Access: Stairs to enter [employer] of Steps: 1 Entrance Stairs-Rails: Right Home Layout: One level     Bathroom Shower/Tub: Producer, television/film/video: Handicapped height     Home Equipment: Grab bars - toilet;Shower seat;Grab bars - Chartered loss adjuster (2 wheels)          Prior Functioning/Environment Prior Level of Function : Independent/Modified Independent;Driving             Mobility Comments: walks with a RW ADLs Comments: independent in self care, no family available to confirm    OT Problem List: Decreased strength;Impaired balance (sitting and/or standing);Decreased activity tolerance;Decreased cognition;Decreased safety awareness;Decreased knowledge of use of DME or AE   OT Treatment/Interventions: Self-care/ADL training;DME and/or AE instruction;Therapeutic activities;Patient/family education;Balance training      OT Goals(Current goals can be found in the care plan section)   Acute Rehab OT Goals OT Goal Formulation: With patient Time For Goal Achievement: 02/13/24 Potential to Achieve Goals: Good ADL Goals Pt Will Perform Grooming: with supervision;standing (at least 2 activities) Pt Will Transfer to  Toilet: with supervision;ambulating;bedside commode (BSC over toilet) Pt Will Perform Toileting - Clothing Manipulation and hygiene: with supervision;sit to/from stand;sitting/lateral leans Additional ADL Goal #1: Pt will self monitor and initiate need for rest break during ADLs and mobility. Additional ADL Goal #2: Pt will demonstrate pursed lip breathing technique with exertion.   OT Frequency:  Min 2X/week    Co-evaluation              AM-PAC OT "6 Clicks" Daily Activity     Outcome Measure Help from another person eating meals?: A Little Help from another person taking care of personal grooming?: A Little Help from another person toileting, which includes using toliet, bedpan, or urinal?: A Little Help from another person bathing (including washing, rinsing, drying)?: A Lot Help from another person to put on and taking off regular upper body clothing?: A Little Help from another person to put on and taking off regular lower body clothing?: A Lot 6 Click Score: 16   End of Session Equipment Utilized During Treatment: Rolling walker (2 wheels);Gait belt  Activity Tolerance: Patient tolerated treatment well Patient left: in bed;with call bell/phone within reach;with bed alarm set  OT Visit Diagnosis: Unsteadiness on feet (R26.81);Other abnormalities of gait and mobility (R26.89);Muscle weakness (generalized) (M62.81);Other symptoms and signs involving cognitive function;Other (comment) (decreased activity tolerance)  Time: 1610-9604 OT Time Calculation (min): 22 min Charges:  OT General Charges $OT Visit: 1 Visit OT Evaluation $OT Eval Moderate Complexity: 1 Mod Berna Spare, OTR/L Acute Rehabilitation Services Office: [phone]   Evern Bio 01/30/2024, 2:22 PM

## 2024-01-30 NOTE — Plan of Care (Signed)

## 2024-01-30 NOTE — Progress Notes (Signed)
 SATURATION QUALIFICATIONS: (This note is used to comply with regulatory documentation for home oxygen)  Patient Saturations on Room Air at Rest = 82%  Patient Saturations on 3 Liters of oxygen while Ambulating = 92%  Please briefly explain why patient needs home oxygen:Pt has failed alternative measures and needs home O2 to maintain SpO2 >90%  Hilton Cork, PT, DPT Secure Chat Preferred  Rehab Office [phone]

## 2024-01-31 ENCOUNTER — Inpatient Hospital Stay (HOSPITAL_COMMUNITY)

## 2024-01-31 DIAGNOSIS — R0602 Shortness of breath: Secondary | ICD-10-CM | POA: Diagnosis not present

## 2024-01-31 LAB — BRAIN NATRIURETIC PEPTIDE: B Natriuretic Peptide: 159.5 pg/mL — ABNORMAL HIGH (ref 0.0–100.0)

## 2024-01-31 LAB — COMPREHENSIVE METABOLIC PANEL
ALT: 63 U/L — ABNORMAL HIGH (ref 0–44)
AST: 17 U/L (ref 15–41)
Albumin: 2.3 g/dL — ABNORMAL LOW (ref 3.5–5.0)
Alkaline Phosphatase: 49 U/L (ref 38–126)
Anion gap: 4 — ABNORMAL LOW (ref 5–15)
BUN: 44 mg/dL — ABNORMAL HIGH (ref 8–23)
CO2: 25 mmol/L (ref 22–32)
Calcium: 8.8 mg/dL — ABNORMAL LOW (ref 8.9–10.3)
Chloride: 112 mmol/L — ABNORMAL HIGH (ref 98–111)
Creatinine, Ser: 2.1 mg/dL — ABNORMAL HIGH (ref 0.61–1.24)
GFR, Estimated: 31 mL/min — ABNORMAL LOW (ref >60)
Glucose, Bld: 97 mg/dL (ref 70–99)
Potassium: 4.9 mmol/L (ref 3.5–5.1)
Sodium: 141 mmol/L (ref 135–145)
Total Bilirubin: 0.4 mg/dL (ref 0.0–1.2)
Total Protein: 4.9 g/dL — ABNORMAL LOW (ref 6.5–8.1)

## 2024-01-31 LAB — BLOOD GAS, ARTERIAL
Acid-base deficit: 0.5 mmol/L (ref 0.0–2.0)
Bicarbonate: 26.3 mmol/L (ref 20.0–28.0)
O2 Saturation: 88.9 %
Patient temperature: 36.8
pCO2 arterial: 51 mmHg — ABNORMAL HIGH (ref 32–48)
pH, Arterial: 7.32 — ABNORMAL LOW (ref 7.35–7.45)
pO2, Arterial: 56 mmHg — ABNORMAL LOW (ref 83–108)

## 2024-01-31 LAB — CBC
HCT: 29.2 % — ABNORMAL LOW (ref 39.0–52.0)
Hemoglobin: 8.2 g/dL — ABNORMAL LOW (ref 13.0–17.0)
MCH: 21.1 pg — ABNORMAL LOW (ref 26.0–34.0)
MCHC: 28.1 g/dL — ABNORMAL LOW (ref 30.0–36.0)
MCV: 75.3 fL — ABNORMAL LOW (ref 80.0–100.0)
Platelets: 155 10*3/uL (ref 150–400)
RBC: 3.88 MIL/uL — ABNORMAL LOW (ref 4.22–5.81)
RDW: 20.3 % — ABNORMAL HIGH (ref 11.5–15.5)
WBC: 13.5 10*3/uL — ABNORMAL HIGH (ref 4.0–10.5)
nRBC: 0 % (ref 0.0–0.2)

## 2024-01-31 LAB — MAGNESIUM: Magnesium: 1.9 mg/dL (ref 1.7–2.4)

## 2024-01-31 LAB — LIPOPROTEIN A (LPA): Lipoprotein (a): 49.2 nmol/L — ABNORMAL HIGH (ref <75.0)

## 2024-01-31 MED ORDER — METHYLPREDNISOLONE SODIUM SUCC 125 MG IJ SOLR
60.0000 mg | INTRAMUSCULAR | Status: DC
  Administered 2024-01-31 – 2024-02-03 (×4): 60 mg via INTRAVENOUS
  Filled 2024-01-31 (×4): qty 2

## 2024-01-31 MED ORDER — FLUTICASONE PROPIONATE 50 MCG/ACT NA SUSP
2.0000 | Freq: Every day | NASAL | Status: DC
  Administered 2024-01-31 – 2024-02-03 (×4): 2 via NASAL
  Filled 2024-01-31: qty 16

## 2024-01-31 MED ORDER — MAGNESIUM SULFATE 2 GM/50ML IV SOLN
2.0000 g | Freq: Once | INTRAVENOUS | Status: AC
  Administered 2024-01-31: 2 g via INTRAVENOUS
  Filled 2024-01-31: qty 50

## 2024-01-31 MED ORDER — FUROSEMIDE 20 MG PO TABS
20.0000 mg | ORAL_TABLET | Freq: Every day | ORAL | Status: DC
  Administered 2024-01-31 – 2024-02-03 (×4): 20 mg via ORAL
  Filled 2024-01-31 (×4): qty 1

## 2024-01-31 MED ORDER — METOPROLOL TARTRATE 50 MG PO TABS
50.0000 mg | ORAL_TABLET | Freq: Two times a day (BID) | ORAL | Status: DC
  Administered 2024-01-31 – 2024-02-03 (×6): 50 mg via ORAL
  Filled 2024-01-31 (×6): qty 1

## 2024-01-31 NOTE — Progress Notes (Signed)
 Mobility Specialist Progress Note;   01/31/24 0937  Mobility  Activity Ambulated with assistance in hallway  Level of Assistance Contact guard assist, steadying assist  Assistive Device Front wheel walker  Distance Ambulated (ft) 125 ft  Activity Response Tolerated well  Mobility Referral Yes  Mobility visit 1 Mobility  Mobility Specialist Start Time (ACUTE ONLY) [id number]  Mobility Specialist Stop Time (ACUTE ONLY) [id number]  Mobility Specialist Time Calculation (min) (ACUTE ONLY) 15 min   Pt agreeable to mobility. On 3LO2 upon arrival. Required MinG assistance during ambulation for safety. Began ambulating on 3LO2, however increased to 4LO2 to stay [facility]. Displayed some SOB w/ exertion. Pt returned safely back to bed with all needs met. On 3LO2.   Caesar Bookman Mobility Specialist Please contact via SecureChat or [employer] [phone]

## 2024-01-31 NOTE — Progress Notes (Addendum)
 Patient Name: Jerry [name]. Date of Encounter: 01/31/2024 [facility] Cardiologist: Elder Negus, MD   Interval Summary  .    No chest pain, remains on [place] @3L   Vital Signs .    Vitals:   01/30/24 2248 01/31/24 0435 01/31/24 0741 01/31/24 0743  BP:  120/71  115/76  Pulse: 93 93 90 97  Resp: 18 (!) 25 20 (!) 25  Temp:  99.6 F (37.6 C)  97.7 F (36.5 C)  TempSrc:  Oral  Axillary  SpO2: 93% 94%  93%  Weight:      Height:        Intake/Output Summary (Last 24 hours) at 01/31/2024 0826 Last data filed at 01/31/2024 0700 Gross per 24 hour  Intake 480 ml  Output 2150 ml  Net -1670 ml      01/28/2024   11:00 PM 01/27/2024    3:16 PM 12/22/2023    5:10 AM  Last 3 Weights  Weight (lbs) 159 lb 13.3 oz 160 lb 160 lb  Weight (kg) 72.5 kg 72.576 kg 72.576 kg      Telemetry/ECG    Sinus Rhythm, brief 4-5 beat runs of NSVT, followed by 30 beat run this morning - Personally Reviewed  Physical Exam .    GEN: No acute distress. On 3L Neck: No JVD Cardiac: RRR, no murmurs, rubs, or gallops.  Respiratory: Clear to auscultation bilaterally. GI: Soft, nontender, non-distended  MS: No edema  Assessment & Plan .     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 Elevated troponin from demand ischemia in the setting of PNA, sepsis and AKI.  -- Continue ASA 81 mg daily, not in statin in the setting of elevated ALT  NSVT -- asked to see the patient this morning regarding NSVT on telemetry, had a couple of short runs followed by a 30 beat run this morning. Asymptomatic.  -- K+ 4.9, check Mag  -- will stop hydrochlorothiazide and increase metoprolol 50mg  BID   Hypertension -- stable, will stop  hydrochlorothiazide and increase metoprolol to 50mg  BID. Continue amlodipine 10 mg at bedtime   Acute respiratory failure  Moderate pulmonary hypertension PNA -- RHC 3/18 showed mean PAP 40 mmHg, mean PCWP 19 mmHg  Patient currently on 3 L of oxygen via [place] SpO2 is 93-96%  Still reporting shortness of breath with ambulation -- remains euvolemic on exam  -- suspect will likely need O2 at time of discharge   Pulmonary HTN --Mixed picture with elevated wedge and PVR 3.8 (latter likely due to OSA and patient noncompliant with CPAP) --Lasix 20mg  Qday   Per primary Sepsis and acute hypoxic failure 2/2 CAP CKD stage 3b Anxiety Depression Insomnia Chronic pain Vascular dementia  Hypertension     For questions or updates, please contact [facility] Please consult [email or web address] for contact info under        Signed, Laverda Page, NP    ATTENDING ATTESTATION:  After conducting a review of all available clinical information with the care team, interviewing the patient, and performing a physical exam, I agree with the findings and plan described in this note.   GEN: No acute distress.   HEENT:  MMM, no JVD, no scleral icterus Cardiac: RRR,  no murmurs, rubs, or gallops.  Respiratory: Clear to auscultation bilaterally. GI: Soft, nontender, non-distended  MS: No edema; No deformity. Neuro:  Nonfocal  Vasc:  +2 radial pulses  Patient doing well.  No chest pain and dyspnea is improving.  Had asymptomatic NSVT this AM.  Agree with increasing BB and ensuring lytes are stable.  Will start lasix 20mg  Qday given elevated wedge pressure (has mixed pulmonary htn with pulmonary venous HTN and PVR 3.8 likely due to untreated OSA).  Cr at baseline of ~2.0.  Alverda Skeans, MD Pager [phone]

## 2024-01-31 NOTE — Plan of Care (Signed)
  Problem: Education: Goal: Knowledge of General Education information will improve Description: Including pain rating scale, medication(s)/side effects and non-pharmacologic comfort measures Outcome: Not Progressing   Problem: Health Behavior/Discharge Planning: Goal: Ability to manage health-related needs will improve Outcome: Not Progressing   Problem: Clinical Measurements: Goal: Ability to maintain clinical measurements within normal limits will improve Outcome: Not Progressing Goal: Will remain free from infection Outcome: Not Progressing Goal: Diagnostic test results will improve Outcome: Not Progressing Goal: Respiratory complications will improve Outcome: Not Progressing Goal: Cardiovascular complication will be avoided Outcome: Not Progressing   Problem: Activity: Goal: Risk for activity intolerance will decrease Outcome: Not Progressing   Problem: Nutrition: Goal: Adequate nutrition will be maintained Outcome: Not Progressing   Problem: Coping: Goal: Level of anxiety will decrease Outcome: Not Progressing   Problem: Elimination: Goal: Will not experience complications related to bowel motility Outcome: Not Progressing Goal: Will not experience complications related to urinary retention Outcome: Not Progressing   Problem: Pain Managment: Goal: General experience of comfort will improve and/or be controlled Outcome: Not Progressing   Problem: Safety: Goal: Ability to remain free from injury will improve Outcome: Not Progressing   Problem: Skin Integrity: Goal: Risk for impaired skin integrity will decrease Outcome: Not Progressing   Problem: Education: Goal: Understanding of CV disease, CV risk reduction, and recovery process will improve Outcome: Not Progressing Goal: Individualized Educational Video(s) Outcome: Not Progressing   Problem: Activity: Goal: Ability to return to baseline activity level will improve Outcome: Not Progressing   Problem:  Cardiovascular: Goal: Ability to achieve and maintain adequate cardiovascular perfusion will improve Outcome: Not Progressing Goal: Vascular access site(s) Level 0-1 will be maintained Outcome: Not Progressing   Problem: Health Behavior/Discharge Planning: Goal: Ability to safely manage health-related needs after discharge will improve Outcome: Not Progressing

## 2024-01-31 NOTE — Plan of Care (Signed)

## 2024-01-31 NOTE — Progress Notes (Signed)
   01/31/24 2133  BiPAP/CPAP/SIPAP  BiPAP/CPAP/SIPAP Pt Type Adult  Reason BIPAP/CPAP not in use Non-compliant  BiPAP/CPAP /SiPAP Vitals  Pulse Rate 76  Resp (!) 22  SpO2 95 %  Bilateral Breath Sounds Clear;Diminished  MEWS Score/Color  MEWS Score 1  MEWS Score Color [place]

## 2024-01-31 NOTE — Progress Notes (Signed)
 PROGRESS NOTE Jerry [name].  IEP:[id number] DOB: Nov 22, 1940 DOA: 01/27/2024 PCP: [name], MD  Brief Narrative/Hospital Course: 36 YOM w/ HTN, prostate cancer status post radical cystoprostatectomy with ileal conduit, hyperlipidemia, vascular dementia presented to hospital with shortness of breath dyspnea  w/ progressive dyspnea for weeks, fatigue and found to be hypoxic, febrile w/leukocytosis 24.5 ceat 2.4  b/l ~2  AST and ALT elevated with normal total bilirubin.  Troponin was elevated at [phone].  Lactic acid elevated at 3.3., CXR-patchy left basilar consolidation,CT chest showed PNA LUL/LLL.  Patient was placed on heparin drip, IV antibiotics and admitted for sepsis/pneumonia/respiratory failure/NSTEMI. 3/18> Drop in hemoglobin without any obvious bleeding.  Anemia panel consistent with anemia of chronic disease and some iron deficiency.  Consultation: Cardiology  Procedure: Cardiac cath 3/18: Mild nonobstructive CAD     Subjective: Seen this am He feels better Still on [place] o2 Overnight afebrile BP stable and monitor showing NSVT. Creat 1.9>2.1 hb 9.6>10.9>8.5>8.2 gm WBC count down 22>19>16>13  Assessment and Plan: Principal Problem:   Shortness of breath Active Problems:   OSA (obstructive sleep apnea)   Asthma, mild intermittent   Benign essential hypertension   Mixed hyperlipidemia   Type 2 MI (myocardial infarction) (HCC)    Sepsis POA due to pneumonia  Acute hypoxic and hypercapnic respiratory failure in the setting of sepsis and pneumonia: Patient febrile tachycardic tachypneic with leukocytosis lactic acidosis on admission.  Continue ceftriaxone.  Azithromycin discontinued due to NSVT.  Continue supportive care, pulmonary toileting, supplemental oxygen nebs ptot and ambulation  NSTEMI-type II MI from demand ischemia Presented with dyspnea exertion dizziness and troponin peaked from [phone]>>[phone]>>3410. W/ Coronary calcification on CT s/p cath 3/18-mild  nonobstructive CAD.  Continue risk factor modification aspirin 81, beta-blocker  Frequent NSVT: Checking mag potassium stable, holding of azithromycin and increasing metoprolol to 50 monitor in telemetry outpatient cardiology  HTN : BP remains controlled.  Continue amlodipine, stopping HCTZ since increasing metoprolol to 50   CKD stage IIIb: Renal function at baseline, fluctuating, continue to monitor Recent Labs    06/26/23 1546 06/27/23 0933 06/28/23 0105 12/22/23 0620 12/27/23 1106 01/27/24 1613 01/27/24 1633 01/28/24 0158 01/28/24 0719 01/29/24 0446 01/29/24 1459 01/29/24 1503 01/30/24 0252 01/31/24 0250  BUN 43* 36* 32* 40* 31* 53*  --  47*  --  52*  --   --  46* 44*  CREATININE 2.22* 1.98* 1.79* 2.10* 1.96* 2.41*  --  1.92*  --  1.94*  --   --  1.95* 2.10*  CO2 24 26 24 22 21 22   --  19*  --  22  --   --  24 25  K 5.2* 4.5 4.8 5.6* 4.9 5.1   < > 5.4*   < > 5.0 4.6  4.7 4.5 4.3 4.9   < > = values in this interval not displayed.    Chronic microcytic anemia Of note he does have baseline chronic anemia with hemoglobin up to 7.8 on 06/28/23, 8.7 on 06/26/24. On admission has elevated likely from hemoconcentration given AKI- has drop in hemoglobin without obvious bleeding while on heparin,anemia panel consistent with anemia of chronic disease and some iron deficiency.  Continue iron supplement, trend hemoglobin transfuse less than 8 g Recent Labs  Lab 01/29/24 0446 01/29/24 1459 01/29/24 1503 01/30/24 0252 01/31/24 0250  HGB 9.6* 10.9*  10.9* 10.9* 8.5* 8.2*  HCT 33.2* 32.0*  32.0* 32.0* 29.4* 29.2*    GAD Depression Insomnia: Mood is stable. Continue Xanax, Lexapro Zyprexa  Chronic pain: Cont  prn oxcodone   Vascular dementia: continue to monitor.     DVT prophylaxis: enoxaparin (LOVENOX) injection 30 mg Start: 01/30/24 0800 SCDs Start: 01/27/24 2226 Code Status:   Code Status: Full Code Family Communication: plan of care discussed with patient.  No  family at bedside, wife is usually here later  Patient status is: Remains hospitalized because of severity of illness Level of care: Progressive Cardiac   Dispo: The patient is from: home w/ wife            Anticipated disposition:PT OT recommending home health, disposition once medically stable   Objective: Vitals last 24 hrs: Vitals:   01/30/24 2248 01/31/24 0435 01/31/24 0741 01/31/24 0743  BP:  120/71  115/76  Pulse: 93 93 90 97  Resp: 18 (!) 25 20 (!) 25  Temp:  99.6 F (37.6 C)  97.7 F (36.5 C)  TempSrc:  Oral  Axillary  SpO2: 93% 94%  93%  Weight:      Height:       Weight change:   Physical Examination: General exam: alert awake, oriented  HEENT:Oral mucosa moist, Ear/Nose WNL grossly Respiratory system: Bilaterally clear BS,no use of accessory muscle Cardiovascular system: S1 & S2 +, No JVD. Gastrointestinal system: Abdomen soft,NT,ND, BS+ Nervous System: Alert, awake, moving all extremities,and following commands. Extremities: LE edema neg,distal peripheral pulses palpable and warm.  Skin: No rashes,no icterus. MSK: Normal muscle bulk,tone, power   Medications reviewed:  Scheduled Meds:  amLODipine  10 mg Oral QHS   aspirin EC  81 mg Oral Daily   enoxaparin (LOVENOX) injection  30 mg Subcutaneous Q24H   escitalopram  5 mg Oral Daily   ferrous sulfate  325 mg Oral Q breakfast   furosemide  20 mg Oral Daily   metoprolol tartrate  50 mg Oral BID   mometasone-formoterol  2 puff Inhalation BID   sodium chloride flush  3 mL Intravenous Q12H   Continuous Infusions:  sodium chloride Stopped (01/29/24 1955)   cefTRIAXone (ROCEPHIN)  IV 2 g (01/30/24 2139)   magnesium sulfate bolus IVPB      Diet Order             Diet Heart Room service appropriate? Yes; Fluid consistency: Thin  Diet effective now                  Intake/Output Summary (Last 24 hours) at 01/31/2024 0958 Last data filed at 01/31/2024 0700 Gross per 24 hour  Intake 480 ml  Output  2150 ml  Net -1670 ml   Net IO Since Admission: -3,449.52 mL [01/31/24 0958]  Wt Readings from Last 3 Encounters:  01/28/24 72.5 kg  12/22/23 72.6 kg  12/13/23 79.5 kg     Unresulted Labs (From admission, onward)     Start     Ordered   02/05/24 0500  Creatinine, serum  (enoxaparin (LOVENOX)    CrCl < 30 ml/min)  Once,   R       Comments: while on enoxaparin therapy.    01/29/24 1532   01/29/24 0500  Comprehensive metabolic panel  Daily,   R      01/28/24 1201   01/28/24 0500  CBC  Daily,   R      01/27/24 2248          Data Reviewed: I have personally reviewed following labs and imaging studies CBC: Recent Labs  Lab 01/27/24 1613 01/27/24 1633 01/28/24 0432 01/28/24 0719 01/29/24 8413  01/29/24 1459 01/29/24 1503 01/30/24 0252 01/31/24 0250  WBC 24.5*  --  22.4*  --  19.5*  --   --  16.5* 13.5*  NEUTROABS 22.1*  --   --   --   --   --   --   --   --   HGB 11.1*   < > 10.6*   < > 9.6* 10.9*  10.9* 10.9* 8.5* 8.2*  HCT 40.6   < > 37.6*   < > 33.2* 32.0*  32.0* 32.0* 29.4* 29.2*  MCV 77.3*  --  74.8*  --  74.4*  --   --  74.2* 75.3*  PLT 233  --  210  --  193  --   --  164 155   < > = values in this interval not displayed.   Basic Metabolic Panel:  Recent Labs  Lab 01/27/24 1613 01/27/24 1633 01/28/24 0158 01/28/24 0719 01/29/24 0446 01/29/24 1459 01/29/24 1503 01/30/24 0252 01/31/24 0250  NA 145   < > 142   < > 142 143  144 140 139 141  K 5.1   < > 5.4*   < > 5.0 4.6  4.7 4.5 4.3 4.9  CL 109  --  114*  --  110  --   --  110 112*  CO2 22  --  19*  --  22  --   --  24 25  GLUCOSE 140*  --  105*  --  127*  --   --  106* 97  BUN 53*  --  47*  --  52*  --   --  46* 44*  CREATININE 2.41*  --  1.92*  --  1.94*  --   --  1.95* 2.10*  CALCIUM 9.3  --  7.2*  --  8.8*  --   --  8.6* 8.8*  MG  --   --   --   --  1.6*  --   --  2.0 1.9   < > = values in this interval not displayed.   GFR: Estimated Creatinine Clearance: 28.8 mL/min (A) (by C-G formula based  on SCr of 2.1 mg/dL (H)). Liver Function Tests:  Recent Labs  Lab 01/27/24 1613 01/28/24 0158 01/29/24 0446 01/30/24 0252 01/31/24 0250  AST 375* 166* 34 19 17  ALT 301* 196* 128* 85* 63*  ALKPHOS 98 62 53 50 49  BILITOT 0.6 1.0 0.7 0.5 0.4  PROT 6.5 4.8* 5.4* 5.1* 4.9*  ALBUMIN 3.4* 2.5* 2.6* 2.4* 2.3*   Recent Labs    01/29/24 0446  CHOL 119  HDL 61  LDLCALC 39  TRIG 94  CHOLHDL 2.0  Sepsis Labs: Recent Labs  Lab 01/28/24 0158 01/28/24 0430 01/28/24 0718 01/28/24 1412  LATICACIDVEN 2.2* 3.3* 0.9 1.9   Recent Results (from the past 240 hours)  Resp panel by RT-PCR (RSV, Flu A&B, Covid) Anterior Nasal Swab     Status: None   Collection Time: 01/27/24  4:13 PM   Specimen: Anterior Nasal Swab  Result Value Ref Range Status   SARS Coronavirus 2 by RT PCR NEGATIVE NEGATIVE Final   Influenza A by PCR NEGATIVE NEGATIVE Final   Influenza B by PCR NEGATIVE NEGATIVE Final    Comment: (NOTE) The Xpert Xpress SARS-CoV-2/FLU/RSV plus assay is intended as an aid in the diagnosis of influenza from Nasopharyngeal swab specimens and should not be used as a sole basis for treatment. Nasal washings and aspirates are unacceptable for Xpert  Xpress SARS-CoV-2/FLU/RSV testing.  Fact Sheet for Patients: BloggerCourse.com  Fact Sheet for Healthcare Providers: SeriousBroker.it  This test is not yet approved or cleared by the [place] FDA and has been authorized for detection and/or diagnosis of SARS-CoV-2 by FDA under an Emergency Use Authorization (EUA). This EUA will remain in effect (meaning this test can be used) for the duration of the COVID-19 declaration under Section 564(b)(1) of the Act, 21 U.S.C. section 360bbb-3(b)(1), unless the authorization is terminated or revoked.     Resp Syncytial Virus by PCR NEGATIVE NEGATIVE Final    Comment: (NOTE) Fact Sheet for  Patients: BloggerCourse.com  Fact Sheet for Healthcare Providers: SeriousBroker.it  This test is not yet approved or cleared by the [place] FDA and has been authorized for detection and/or diagnosis of SARS-CoV-2 by FDA under an Emergency Use Authorization (EUA). This EUA will remain in effect (meaning this test can be used) for the duration of the COVID-19 declaration under Section 564(b)(1) of the Act, 21 U.S.C. section 360bbb-3(b)(1), unless the authorization is terminated or revoked.  Performed at [facility], [address] [place], [place] [address]   Blood culture (routine x 2)     Status: None (Preliminary result)   Collection Time: 01/27/24  9:00 PM   Specimen: BLOOD RIGHT ARM  Result Value Ref Range Status   Specimen Description BLOOD RIGHT ARM  Final   Special Requests   Final    BOTTLES DRAWN AEROBIC AND ANAEROBIC Blood Culture results may not be optimal due to an inadequate volume of blood received in culture bottles   Culture   Final    NO GROWTH 4 DAYS Performed at [facility], [address], [place], [place] [address]    Report Status PENDING  Incomplete  Blood culture (routine x 2)     Status: None (Preliminary result)   Collection Time: 01/27/24  9:05 PM   Specimen: BLOOD LEFT ARM  Result Value Ref Range Status   Specimen Description BLOOD LEFT ARM  Final   Special Requests   Final    BOTTLES DRAWN AEROBIC AND ANAEROBIC Blood Culture results may not be optimal due to an inadequate volume of blood received in culture bottles   Culture   Final    NO GROWTH 4 DAYS Performed at [facility], [address] [place], [place] [address]    Report Status PENDING  Incomplete  Antimicrobials/Microbiology: Anti-infectives (From admission, onward)    Start     Dose/Rate Route Frequency Ordered Stop   01/28/24 2200  cefTRIAXone (ROCEPHIN) 2 g in sodium chloride 0.9 % 100 mL IVPB        2  g 200 mL/hr over 30 Minutes Intravenous Every 24 hours 01/27/24 2245     01/27/24 2300  azithromycin (ZITHROMAX) tablet 500 mg  Status:  Discontinued        500 mg Oral Daily at bedtime 01/27/24 2245 01/31/24 0820   01/27/24 1745  cefTRIAXone (ROCEPHIN) 1 g in sodium chloride 0.9 % 100 mL IVPB        1 g 200 mL/hr over 30 Minutes Intravenous  Once 01/27/24 1732 01/27/24 2154   01/27/24 1745  azithromycin (ZITHROMAX) 500 mg in sodium chloride 0.9 % 250 mL IVPB        500 mg 250 mL/hr over 60 Minutes Intravenous  Once 01/27/24 1732 01/27/24 2317         Component Value Date/Time   SDES BLOOD LEFT ARM  01/27/2024 2105   SPECREQUEST  01/27/2024 2105    BOTTLES DRAWN AEROBIC AND ANAEROBIC Blood Culture results may not be optimal due to an inadequate volume of blood received in culture bottles   CULT  01/27/2024 2105    NO GROWTH 4 DAYS Performed at [facility], [address], [place], [place] [address]    REPTSTATUS PENDING 01/27/2024 2105  Radiology Studies: CARDIAC CATHETERIZATION Result Date: 01/29/2024   Prox RCA lesion is 25% stenosed.   LV end diastolic pressure is mildly elevated.   Hemodynamic findings consistent with moderate pulmonary hypertension. Minimal nonobstructive CAD Mildly elevated LV filling pressures. LVEDP 19 mm Hg. PCWP 24/27 with mean 19 mm Hg Moderate pulmonary HTN. PAP 65/30 with mean 40 mm Hg Cardiac output 5.49 L/min, index 2.83 Plan: medical management. Type 2 NSTEMI   PERIPHERAL VASCULAR CATHETERIZATION Result Date: 01/29/2024   Prox RCA lesion is 25% stenosed.   LV end diastolic pressure is mildly elevated.   Hemodynamic findings consistent with moderate pulmonary hypertension. Minimal nonobstructive CAD Mildly elevated LV filling pressures. LVEDP 19 mm Hg. PCWP 24/27 with mean 19 mm Hg Moderate pulmonary HTN. PAP 65/30 with mean 40 mm Hg Cardiac output 5.49 L/min, index 2.83 Plan: medical management. Type 2 NSTEMI    LOS: 4 days   Total time spent in  review of labs and imaging, patient evaluation, formulation of plan, documentation and communication with family: 35 minutes  Lanae Boast, MD  [facility]  01/31/2024, 9:58 AM

## 2024-02-01 DIAGNOSIS — R0602 Shortness of breath: Secondary | ICD-10-CM | POA: Diagnosis not present

## 2024-02-01 LAB — GLUCOSE, CAPILLARY: Glucose-Capillary: 178 mg/dL — ABNORMAL HIGH (ref 70–99)

## 2024-02-01 LAB — CBC
HCT: 29.7 % — ABNORMAL LOW (ref 39.0–52.0)
Hemoglobin: 8.3 g/dL — ABNORMAL LOW (ref 13.0–17.0)
MCH: 21 pg — ABNORMAL LOW (ref 26.0–34.0)
MCHC: 27.9 g/dL — ABNORMAL LOW (ref 30.0–36.0)
MCV: 75 fL — ABNORMAL LOW (ref 80.0–100.0)
Platelets: 169 10*3/uL (ref 150–400)
RBC: 3.96 MIL/uL — ABNORMAL LOW (ref 4.22–5.81)
RDW: 20.5 % — ABNORMAL HIGH (ref 11.5–15.5)
WBC: 10.4 10*3/uL (ref 4.0–10.5)
nRBC: 0 % (ref 0.0–0.2)

## 2024-02-01 LAB — COMPREHENSIVE METABOLIC PANEL
ALT: 54 U/L — ABNORMAL HIGH (ref 0–44)
AST: 15 U/L (ref 15–41)
Albumin: 2.3 g/dL — ABNORMAL LOW (ref 3.5–5.0)
Alkaline Phosphatase: 50 U/L (ref 38–126)
Anion gap: 6 (ref 5–15)
BUN: 48 mg/dL — ABNORMAL HIGH (ref 8–23)
CO2: 24 mmol/L (ref 22–32)
Calcium: 8.8 mg/dL — ABNORMAL LOW (ref 8.9–10.3)
Chloride: 109 mmol/L (ref 98–111)
Creatinine, Ser: 2.09 mg/dL — ABNORMAL HIGH (ref 0.61–1.24)
GFR, Estimated: 31 mL/min — ABNORMAL LOW (ref >60)
Glucose, Bld: 175 mg/dL — ABNORMAL HIGH (ref 70–99)
Potassium: 5.1 mmol/L (ref 3.5–5.1)
Sodium: 139 mmol/L (ref 135–145)
Total Bilirubin: 0.4 mg/dL (ref 0.0–1.2)
Total Protein: 5.4 g/dL — ABNORMAL LOW (ref 6.5–8.1)

## 2024-02-01 LAB — CULTURE, BLOOD (ROUTINE X 2)
Culture: NO GROWTH
Culture: NO GROWTH

## 2024-02-01 MED ORDER — ATORVASTATIN CALCIUM 10 MG PO TABS
20.0000 mg | ORAL_TABLET | Freq: Every day | ORAL | Status: DC
  Administered 2024-02-01 – 2024-02-03 (×3): 20 mg via ORAL
  Filled 2024-02-01 (×3): qty 2

## 2024-02-01 NOTE — Plan of Care (Signed)

## 2024-02-01 NOTE — Progress Notes (Signed)
   02/01/24 2035  BiPAP/CPAP/SIPAP  BiPAP/CPAP/SIPAP Pt Type Adult  BiPAP/CPAP/SIPAP SERVO  Reason BIPAP/CPAP not in use Non-compliant  Mask Type Full face mask

## 2024-02-01 NOTE — Progress Notes (Signed)
 PROGRESS NOTE Jerry [name].  BJY:[id number] DOB: 30-Nov-1940 DOA: 01/27/2024 PCP: [name], MD  Brief Narrative/Hospital Course: 64 YOM w/ HTN, prostate cancer status post radical cystoprostatectomy with ileal conduit, hyperlipidemia, vascular dementia presented to hospital with shortness of breath dyspnea  w/ progressive dyspnea for weeks, fatigue and found to be hypoxic, febrile w/leukocytosis 24.5 ceat 2.4  b/l ~2  AST and ALT elevated with normal total bilirubin.  Troponin was elevated at [phone].  Lactic acid elevated at 3.3., CXR-patchy left basilar consolidation,CT chest showed PNA LUL/LLL.  Patient was placed on heparin drip, IV antibiotics and admitted for sepsis/pneumonia/respiratory failure/NSTEMI. 3/18> Drop in hemoglobin without any obvious bleeding.  Anemia panel consistent with anemia of chronic disease and some iron deficiency. For ongoing hypoxia management bronchodilators, diuretics, Solu-Medrol started 3/20 due to COPD, and on home dulera as well.  Consultation: Cardiology  Procedure: Cardiac cath 3/18: Mild nonobstructive CAD     Subjective:  See this am on 4l n aao3 feels better Creat 1.9>2.1> 2  hb 9.6>10.9>8.5>8.2> 8.3 Leukocytosis resolved.  Alert awake oriented appears pleasant no complaint  Assessment and Plan: Principal Problem:   Shortness of breath Active Problems:   OSA (obstructive sleep apnea)   Asthma, mild intermittent   Benign essential hypertension   Mixed hyperlipidemia   Type 2 MI (myocardial infarction) (HCC)   Sepsis POA due to pneumonia  Acute hypoxic and hypercapnic respiratory failure in the setting of sepsis and pneumonia Suspect COPD exacerbation: Patient febrile tachycardic tachypneic with leukocytosis lactic acidosis on admission.  Continue ceftriaxone x 7 days.Azithromycin discontinued due to NSVT.  Continue supplemental oxygen, bronchodilators steroid iv started 3/20. Cont Dulera. Cont ptot Continue aspiration  precautions.  NSTEMI-type II MI from demand ischemia: Presented with dyspnea exertion dizziness and troponin peaked from 775>>6425>>3410. W/ Coronary calcification on CT s/p cath 3/18-mild nonobstructive CAD.  Continue risk factor modification aspirin 81, beta-blocker  Frequent NSVT: Checking mag potassium stable, holding of azithromycin and increased metoprolol to 50 monitor in telemetry outpatient cardiology  HTN : BP stable continue amlodipine, stopped HCTZ since increasing metoprolol to 50 .  CKD stage IIIb: Renal function at baseline, fluctuating, continue to monitor Recent Labs    06/27/23 0933 06/28/23 0105 12/22/23 0620 12/27/23 1106 01/27/24 1613 01/27/24 1633 01/28/24 0158 01/28/24 0719 01/29/24 0446 01/29/24 1459 01/29/24 1503 01/30/24 0252 01/31/24 0250 02/01/24 0233  BUN 36* 32* 40* 31* 53*  --  47*  --  52*  --   --  46* 44* 48*  CREATININE 1.98* 1.79* 2.10* 1.96* 2.41*  --  1.92*  --  1.94*  --   --  1.95* 2.10* 2.09*  CO2 26 24 22 21 22   --  19*  --  22  --   --  24 25 24   K 4.5 4.8 5.6* 4.9 5.1   < > 5.4*   < > 5.0 4.6  4.7 4.5 4.3 4.9 5.1   < > = values in this interval not displayed.    Chronic microcytic anemia Of note he does have baseline chronic anemia with hemoglobin up to 7.8 on 06/28/23, 8.7 on 06/26/24. On admission has elevated likely from hemoconcentration given AKI- has drop in hemoglobin without obvious bleeding while on heparin,anemia panel consistent with anemia of chronic disease and some iron deficiency.  Continue iron supplement, trend hemoglobin transfuse less than 8 g Recent Labs  Lab 01/29/24 1459 01/29/24 1503 01/30/24 0252 01/31/24 0250 02/01/24 0233  HGB 10.9*  10.9* 10.9* 8.5* 8.2*  8.3*  HCT 32.0*  32.0* 32.0* 29.4* 29.2* 29.7*    GAD Depression Insomnia: Mood is stable.  Continue home Xanax, Lexapro Zyprexa   Chronic pain: Stable, cont  prn oxcodone   Vascular dementia: continue to monitor.    Deconditioning/debility: Cont ptot   DVT prophylaxis: enoxaparin (LOVENOX) injection 30 mg Start: 01/30/24 0800 SCDs Start: 01/27/24 2226 Code Status:   Code Status: Full Code Family Communication: plan of care discussed with patient.  No family at bedside, wife is usually here later Patient wife was updated extensively 3/21 evening  Patient status is: Remains hospitalized because of severity of illness Level of care: Progressive Cardiac   Dispo: The patient is from: home w/ wife            Anticipated disposition:PT OT recommending home health, disposition once medically stable  1-2 days  Objective: Vitals last 24 hrs: Vitals:   01/31/24 2133 01/31/24 2342 02/01/24 0323 02/01/24 0749  BP:  114/69 108/64   Pulse: 76 75 75   Resp: (!) 22 (!) 22 19   Temp:  97.8 F (36.6 C) 98 F (36.7 C) 98 F (36.7 C)  TempSrc:  Oral Oral Oral  SpO2: 95% 95% 95%   Weight:      Height:       Weight change:   Physical Examination: General exam: alert awake, WEAK, HEENT:Oral mucosa moist, Ear/Nose WNL grossly Respiratory system: Bilaterally mild wheezing BS,no use of accessory muscle Cardiovascular system: S1 & S2 +, No JVD. Gastrointestinal system: Abdomen soft,NT,ND, BS+ Nervous System: Alert, awake, moving all extremities,and following commands. Extremities: LE edema neg,distal peripheral pulses palpable and warm.  Skin: No rashes,no icterus. MSK: Normal muscle bulk,tone, power   Medications reviewed:  Scheduled Meds:  amLODipine  10 mg Oral QHS   aspirin EC  81 mg Oral Daily   enoxaparin (LOVENOX) injection  30 mg Subcutaneous Q24H   escitalopram  5 mg Oral Daily   ferrous sulfate  325 mg Oral Q breakfast   fluticasone  2 spray Each Nare Daily   furosemide  20 mg Oral Daily   methylPREDNISolone (SOLU-MEDROL) injection  60 mg Intravenous Q24H   metoprolol tartrate  50 mg Oral BID   mometasone-formoterol  2 puff Inhalation BID   sodium chloride flush  3 mL Intravenous Q12H    Continuous Infusions:  cefTRIAXone (ROCEPHIN)  IV 2 g (01/31/24 2113)    Diet Order             Diet Heart Room service appropriate? Yes; Fluid consistency: Thin  Diet effective now                  Intake/Output Summary (Last 24 hours) at 02/01/2024 0925 Last data filed at 01/31/2024 2113 Gross per 24 hour  Intake 150 ml  Output 1225 ml  Net -1075 ml   Net IO Since Admission: -4,524.52 mL [02/01/24 0925]  Wt Readings from Last 3 Encounters:  01/28/24 72.5 kg  12/22/23 72.6 kg  12/13/23 79.5 kg     Unresulted Labs (From admission, onward)     Start     Ordered   02/05/24 0500  Creatinine, serum  (enoxaparin (LOVENOX)    CrCl < 30 ml/min)  Once,   R       Comments: while on enoxaparin therapy.    01/29/24 1532   01/29/24 0500  Comprehensive metabolic panel  Daily,   R      01/28/24 1201  Data Reviewed: I have personally reviewed following labs and imaging studies CBC: Recent Labs  Lab 01/27/24 1613 01/27/24 1633 01/28/24 0432 01/28/24 0719 01/29/24 0446 01/29/24 1459 01/29/24 1503 01/30/24 0252 01/31/24 0250 02/01/24 0233  WBC 24.5*  --  22.4*  --  19.5*  --   --  16.5* 13.5* 10.4  NEUTROABS 22.1*  --   --   --   --   --   --   --   --   --   HGB 11.1*   < > 10.6*   < > 9.6* 10.9*  10.9* 10.9* 8.5* 8.2* 8.3*  HCT 40.6   < > 37.6*   < > 33.2* 32.0*  32.0* 32.0* 29.4* 29.2* 29.7*  MCV 77.3*  --  74.8*  --  74.4*  --   --  74.2* 75.3* 75.0*  PLT 233  --  210  --  193  --   --  164 155 169   < > = values in this interval not displayed.   Basic Metabolic Panel:  Recent Labs  Lab 01/28/24 0158 01/28/24 0719 01/29/24 0446 01/29/24 1459 01/29/24 1503 01/30/24 0252 01/31/24 0250 02/01/24 0233  NA 142   < > 142 143  144 140 139 141 139  K 5.4*   < > 5.0 4.6  4.7 4.5 4.3 4.9 5.1  CL 114*  --  110  --   --  110 112* 109  CO2 19*  --  22  --   --  24 25 24   GLUCOSE 105*  --  127*  --   --  106* 97 175*  BUN 47*  --  52*  --   --  46* 44*  48*  CREATININE 1.92*  --  1.94*  --   --  1.95* 2.10* 2.09*  CALCIUM 7.2*  --  8.8*  --   --  8.6* 8.8* 8.8*  MG  --   --  1.6*  --   --  2.0 1.9  --    < > = values in this interval not displayed.   GFR: Estimated Creatinine Clearance: 28.9 mL/min (A) (by C-G formula based on SCr of 2.09 mg/dL (H)). Liver Function Tests:  Recent Labs  Lab 01/28/24 0158 01/29/24 0446 01/30/24 0252 01/31/24 0250 02/01/24 0233  AST 166* 34 19 17 15   ALT 196* 128* 85* 63* 54*  ALKPHOS 62 53 50 49 50  BILITOT 1.0 0.7 0.5 0.4 0.4  PROT 4.8* 5.4* 5.1* 4.9* 5.4*  ALBUMIN 2.5* 2.6* 2.4* 2.3* 2.3*   No results for input(s): "CHOL", "HDL", "LDLCALC", "TRIG", "CHOLHDL", "LDLDIRECT" in the last 72 hours. Sepsis Labs: Recent Labs  Lab 01/28/24 0158 01/28/24 0430 01/28/24 0718 01/28/24 1412  LATICACIDVEN 2.2* 3.3* 0.9 1.9   Recent Results (from the past 240 hours)  Resp panel by RT-PCR (RSV, Flu A&B, Covid) Anterior Nasal Swab     Status: None   Collection Time: 01/27/24  4:13 PM   Specimen: Anterior Nasal Swab  Result Value Ref Range Status   SARS Coronavirus 2 by RT PCR NEGATIVE NEGATIVE Final   Influenza A by PCR NEGATIVE NEGATIVE Final   Influenza B by PCR NEGATIVE NEGATIVE Final    Comment: (NOTE) The Xpert Xpress SARS-CoV-2/FLU/RSV plus assay is intended as an aid in the diagnosis of influenza from Nasopharyngeal swab specimens and should not be used as a sole basis for treatment. Nasal washings and aspirates are unacceptable for Xpert Xpress SARS-CoV-2/FLU/RSV testing.  Fact Sheet for Patients: BloggerCourse.com  Fact Sheet for Healthcare Providers: SeriousBroker.it  This test is not yet approved or cleared by the [place] FDA and has been authorized for detection and/or diagnosis of SARS-CoV-2 by FDA under an Emergency Use Authorization (EUA). This EUA will remain in effect (meaning this test can be used) for the duration of  the COVID-19 declaration under Section 564(b)(1) of the Act, 21 U.S.C. section 360bbb-3(b)(1), unless the authorization is terminated or revoked.     Resp Syncytial Virus by PCR NEGATIVE NEGATIVE Final    Comment: (NOTE) Fact Sheet for Patients: BloggerCourse.com  Fact Sheet for Healthcare Providers: SeriousBroker.it  This test is not yet approved or cleared by the [place] FDA and has been authorized for detection and/or diagnosis of SARS-CoV-2 by FDA under an Emergency Use Authorization (EUA). This EUA will remain in effect (meaning this test can be used) for the duration of the COVID-19 declaration under Section 564(b)(1) of the Act, 21 U.S.C. section 360bbb-3(b)(1), unless the authorization is terminated or revoked.  Performed at [facility], [address] [place], [place] [address]   Blood culture (routine x 2)     Status: None   Collection Time: 01/27/24  9:00 PM   Specimen: BLOOD RIGHT ARM  Result Value Ref Range Status   Specimen Description BLOOD RIGHT ARM  Final   Special Requests   Final    BOTTLES DRAWN AEROBIC AND ANAEROBIC Blood Culture results may not be optimal due to an inadequate volume of blood received in culture bottles   Culture   Final    NO GROWTH 5 DAYS Performed at [facility], [address] [place], [place] [address]    Report Status 02/01/2024 FINAL  Final  Blood culture (routine x 2)     Status: None   Collection Time: 01/27/24  9:05 PM   Specimen: BLOOD LEFT ARM  Result Value Ref Range Status   Specimen Description BLOOD LEFT ARM  Final   Special Requests   Final    BOTTLES DRAWN AEROBIC AND ANAEROBIC Blood Culture results may not be optimal due to an inadequate volume of blood received in culture bottles   Culture   Final    NO GROWTH 5 DAYS Performed at [facility], [address] [place], [place] [address]    Report Status 02/01/2024 FINAL  Final   Antimicrobials/Microbiology: Anti-infectives (From admission, onward)    Start     Dose/Rate Route Frequency Ordered Stop   01/28/24 2200  cefTRIAXone (ROCEPHIN) 2 g in sodium chloride 0.9 % 100 mL IVPB        2 g 200 mL/hr over 30 Minutes Intravenous Every 24 hours 01/27/24 2245     01/27/24 2300  azithromycin (ZITHROMAX) tablet 500 mg  Status:  Discontinued        500 mg Oral Daily at bedtime 01/27/24 2245 01/31/24 0820   01/27/24 1745  cefTRIAXone (ROCEPHIN) 1 g in sodium chloride 0.9 % 100 mL IVPB        1 g 200 mL/hr over 30 Minutes Intravenous  Once 01/27/24 1732 01/27/24 2154   01/27/24 1745  azithromycin (ZITHROMAX) 500 mg in sodium chloride 0.9 % 250 mL IVPB        500 mg 250 mL/hr over 60 Minutes Intravenous  Once 01/27/24 1732 01/27/24 2317         Component Value Date/Time   SDES BLOOD LEFT ARM 01/27/2024 2105   SPECREQUEST  01/27/2024 2105    BOTTLES DRAWN AEROBIC AND ANAEROBIC Blood Culture results may not be optimal due to an inadequate volume of blood received in culture bottles   CULT  01/27/2024 2105    NO GROWTH 5 DAYS Performed at [facility], [address], [place], [place] [address]    REPTSTATUS 02/01/2024 FINAL 01/27/2024 2105  Radiology Studies: [facility] Chest Port 1 View Result Date: 01/31/2024 CLINICAL DATA:  Shortness of breath. EXAM: PORTABLE CHEST 1 VIEW COMPARISON:  January 27, 2024. FINDINGS: Stable cardiomediastinal silhouette. Right lung is clear. Mild left basilar atelectasis or infiltrate is noted with possible small pleural effusion. Bony thorax is unremarkable. IMPRESSION: Mild left basilar atelectasis or infiltrate is noted with small left pleural effusion. Electronically Signed   By: Lupita Raider M.D.   On: 01/31/2024 15:00    LOS: 5 days   Total time spent in review of labs and imaging, patient evaluation, formulation of plan, documentation and communication with family: 35 minutes  Lanae Boast, MD  [facility]  02/01/2024,  9:25 AM

## 2024-02-01 NOTE — Progress Notes (Signed)
   Patient Name: Jerry [name]. Date of Encounter: 02/01/2024 [facility] Cardiologist: Elder Negus, MD   Interval Summary  .    No acute events overnight.  Remains afebrile, WBC decreasing  Vital Signs .    Vitals:   01/31/24 2133 01/31/24 2342 02/01/24 0323 02/01/24 0749  BP:  114/69 108/64   Pulse: 76 75 75   Resp: (!) 22 (!) 22 19   Temp:  97.8 F (36.6 C) 98 F (36.7 C) 98 F (36.7 C)  TempSrc:  Oral Oral Oral  SpO2: 95% 95% 95%   Weight:      Height:        Intake/Output Summary (Last 24 hours) at 02/01/2024 4742 Last data filed at 01/31/2024 2113 Gross per 24 hour  Intake 150 ml  Output 1225 ml  Net -1075 ml      01/28/2024   11:00 PM 01/27/2024    3:16 PM 12/22/2023    5:10 AM  Last 3 Weights  Weight (lbs) 159 lb 13.3 oz 160 lb 160 lb  Weight (kg) 72.5 kg 72.576 kg 72.576 kg      Telemetry/ECG    Sinus Rhythm, sinus arrhythmia - Personally Reviewed  Physical Exam .    GEN: No acute distress.  Neck: No JVD Cardiac: RRR, no murmurs, rubs, or gallops.  Respiratory: Clear to auscultation bilaterally. GI: Soft, nontender, non-distended  MS: No edema  Assessment & Plan .     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 Elevated troponin from demand ischemia in the setting of PNA, sepsis and AKI.  -- Continue ASA 81 mg daily, LFTs decreasing, will start atorvastatin 20  NSVT -- Resolved -- will stop hydrochlorothiazide and increase metoprolol 50mg  BID   Hypertension -- Continue metoprolol to 50mg  BID, amlodipine 10 mg at bedtime   Acute respiratory failure  Moderate pulmonary hypertension PNA -- RHC 3/18 showed mean PAP 40 mmHg, mean PCWP 19 mmHg  Patient currently on 3 L  of oxygen via [place] SpO2 is 93-96%  Still reporting shortness of breath with ambulation -- remains euvolemic on exam  -- suspect will likely need O2 at time of discharge  --Continue lasix 20mg  daily for elevated wedge pressure.  Pulmonary HTN --Mixed picture with elevated wedge and PVR 3.8 (latter likely due to OSA and patient noncompliant with CPAP) --Lasix 20mg  Qday   Per primary Sepsis and acute hypoxic failure 2/2 CAP CKD stage 3b Anxiety Depression Insomnia Chronic pain Vascular dementia  Hypertension    Will sign off, has cardiology follow up scheduled 4/1 with Dr Rosemary Holms.  For questions or updates, please contact [facility] Please consult [email or web address] for contact info under        Signed, Orbie Pyo, MD

## 2024-02-01 NOTE — Progress Notes (Signed)
 Physical Therapy Treatment Patient Details Name: Jerry [name]. MRN: [id number] DOB: 07-10-1941 Today's Date: 02/01/2024   History of Present Illness 83 y.o. male presents to [facility] on 01/27/24 with SOB, dyspnea, and fatigue. Pt with patchy L basilar consolidation, sepsis POA 2/2 PNA. Pt also admitted with NSTEMI, s/p cardiac cath 3/18. PMH includes: Vit D def, hyperlipidemia, prostate cancer s/p radical cystoprostatectomy with ileal conduit,asthma, vascular dementia, CKDIIIb, HTN, chronic pain    PT Comments  Pt resting in bed on arrival and agreeable to session with encouragement. Pt demonstrating bed mobility at supervision level and requiring CGA for transfers and gait with RW for support for safety. Pt continues to be limited by decreased cardiopulmonary endurance requiring 4L O2 to maintain SpO2 >90% with mobility. Educated pt on importance of continued mobility and time up OOB as well as appropriate activity progression with pt verbalizing understanding. Pt continues to benefit from skilled PT services to progress toward functional mobility goals. Pt continues to benefit from skilled PT services to progress toward functional mobility goals.      If plan is discharge home, recommend the following: A little help with walking and/or transfers;A little help with bathing/dressing/bathroom;Assistance with cooking/housework;Assist for transportation;Help with stairs or ramp for entrance   Can travel by private vehicle        Equipment Recommendations  None recommended by PT    Recommendations for Other Services       Precautions / Restrictions Precautions Precautions: Fall Precaution/Restrictions Comments: watch O2 Restrictions Weight Bearing Restrictions Per Provider Order: No     Mobility  Bed Mobility Overal bed mobility: Needs Assistance Bed Mobility: Supine to Sit, Sit to Supine     Supine to sit: HOB elevated, Supervision Sit to supine: Supervision, HOB elevated   General  bed mobility comments: supervision for safety    Transfers Overall transfer level: Needs assistance Equipment used: Rolling walker (2 wheels) Transfers: Sit to/from Stand Sit to Stand: Contact guard assist           General transfer comment: CGA for safety    Ambulation/Gait Ambulation/Gait assistance: Contact guard assist Gait Distance (Feet): 200 Feet Assistive device: Rolling walker (2 wheels) Gait Pattern/deviations: Step-through pattern, Trunk flexed, Narrow base of support, Decreased dorsiflexion - right, Decreased dorsiflexion - left, Decreased step length - right, Decreased step length - left Gait velocity: decr     General Gait Details: short, shuffled steps with a forward flexed trunk. Steady with no LOB, SpO2 85% on 3L increased to 4L to maintain >90%   Stairs             Wheelchair Mobility     Tilt Bed    Modified Rankin (Stroke Patients Only)       Balance Overall balance assessment: Needs assistance, Mild deficits observed, not formally tested Sitting-balance support: No upper extremity supported, Feet supported Sitting balance-Leahy Scale: Good     Standing balance support: Bilateral upper extremity supported, During functional activity, Reliant on assistive device for balance Standing balance-Leahy Scale: Poor Standing balance comment: reliant on RW, can release at sink in static standing to wash hands                            Communication Communication Communication: No apparent difficulties  Cognition Arousal: Alert Behavior During Therapy: WFL for tasks assessed/performed   PT - Cognitive impairments: History of cognitive impairments  Following commands: Impaired Following commands impaired: Follows one step commands with increased time    Cueing Cueing Techniques: Verbal cues, Tactile cues  Exercises      General Comments        Pertinent Vitals/Pain Pain Assessment Pain  Assessment: Faces Faces Pain Scale: No hurt Pain Intervention(s): Monitored during session    Home Living                          Prior Function            PT Goals (current goals can now be found in the care plan section) Acute Rehab PT Goals Patient Stated Goal: to get better PT Goal Formulation: With patient Time For Goal Achievement: 02/13/24 Progress towards PT goals: Progressing toward goals    Frequency    Min 2X/week      PT Plan      Co-evaluation              AM-PAC PT "6 Clicks" Mobility   Outcome Measure  Help needed turning from your back to your side while in a flat bed without using bedrails?: A Little Help needed moving from lying on your back to sitting on the side of a flat bed without using bedrails?: A Little Help needed moving to and from a bed to a chair (including a wheelchair)?: A Little Help needed standing up from a chair using your arms (e.g., wheelchair or bedside chair)?: A Little Help needed to walk in hospital room?: A Little Help needed climbing 3-5 steps with a railing? : A Lot 6 Click Score: 17    End of Session Equipment Utilized During Treatment: Gait belt;Oxygen Activity Tolerance: Patient tolerated treatment well Patient left: in bed;with call bell/phone within reach;with bed alarm set Nurse Communication: Mobility status PT Visit Diagnosis: Unsteadiness on feet (R26.81);Other abnormalities of gait and mobility (R26.89);Muscle weakness (generalized) (M62.81)     Time: 9562-1308 PT Time Calculation (min) (ACUTE ONLY): 21 min  Charges:    $Gait Training: 8-22 mins PT General Charges $$ ACUTE PT VISIT: 1 Visit                     Tamryn Popko R. PTA Acute Rehabilitation Services Office: [phone]   Catalina Antigua 02/01/2024, 12:05 PM

## 2024-02-01 NOTE — Evaluation (Addendum)
 Clinical/Bedside Swallow Evaluation Patient Details  Name: Jerry [name]. MRN: [id number] Date of Birth: 1941-08-15  Today's Date: 02/01/2024 Time: SLP Start Time (ACUTE ONLY): 0815 SLP Stop Time (ACUTE ONLY): 0825 SLP Time Calculation (min) (ACUTE ONLY): 10 min  Past Medical History:  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   Malnutrition of moderate degree 06/28/2023   Mixed hyperlipidemia 07/21/2021   OSA (obstructive sleep apnea) 07/12/2021   NPSG- 03/16/21- AHI 10.3/ hr, desaturation to 87%, body weight 190 lbs     Renal insufficiency 04/17/2015   Syncope 04/17/2015   Tremor 07/21/2021   Vascular dementia (HCC)    Vitamin D deficiency 07/21/2021   Past Surgical History:  Past Surgical History:  Procedure Laterality Date   BACK SURGERY     BIOPSY  10/09/2023   Procedure: BIOPSY;  Surgeon: Kathi Der, MD;  Location: [facility];  Service: Gastroenterology;;   CHOLECYSTECTOMY     COLONOSCOPY WITH PROPOFOL Bilateral 10/09/2023   Procedure: COLONOSCOPY WITH PROPOFOL;  Surgeon: Kathi Der, MD;  Location: [facility];  Service: Gastroenterology;  Laterality: Bilateral;   ESOPHAGOGASTRODUODENOSCOPY (EGD) WITH PROPOFOL Bilateral 10/09/2023   Procedure: ESOPHAGOGASTRODUODENOSCOPY (EGD) WITH PROPOFOL;  Surgeon: Kathi Der, MD;  Location: [facility];  Service: Gastroenterology;  Laterality: Bilateral;   HOT HEMOSTASIS N/A 10/09/2023   Procedure: HOT HEMOSTASIS (ARGON PLASMA COAGULATION/BICAP);   Surgeon: Kathi Der, MD;  Location: [facility];  Service: Gastroenterology;  Laterality: N/A;   KNEE SURGERY Left    LOWER EXTREMITY VENOGRAPHY  01/29/2024   Procedure: LOWER EXTREMITY VENOGRAPHY;  Surgeon: Swaziland, Peter M, MD;  Location: [facility] IN[facility]AS[facility] CV LAB;  Service: Cardiovascular;;   RIGHT/LEFT HEART CATH AND CORONARY ANGIOGRAPHY N/A 01/29/2024   Procedure: RIGHT/LEFT HEART CATH AND CORONARY ANGIOGRAPHY;  Surgeon: Swaziland, Peter M, MD;  Location: [facility] INVASIVE CV LAB;  Service: Cardiovascular;  Laterality: N/A;   SHOULDER SURGERY     for rotator cuff tear   HPI:  83 yr old w/ HTN, 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 CT chest showed PNA LUL/LLL. Per chart admitted for sepsis/pneumonia/respiratory failure/NSTEMI.    Assessment / Plan / Recommendation  Clinical Impression  Pt presents with normal oropharyngeal swallow as observed with breakfast meal. He was self feeding, oromotor abilities adequate with strong volitional cough. He states in the past he may cough once in awhile when eating/drinking but does not occur often. He is only missing 1 tooth otherwise dentition is intact and he masticated sausage, eggs, and pancakes with adequate length of time. No oral residue noted. There were no s/s aspiration with consecutive straw sips thin juice. Aspiration risk appears low per medical history and he states he has not had additional pna in past few years. One CXR six months ago sited "patchy left mid to lower lung opacity and small left effusion. Acute infiltrate is possible" however  he denied being told he had pna. Recommend continue regular texture, thin liquids. pills with thin and no further ST is warranted. SLP Visit Diagnosis: Dysphagia, unspecified (R13.10)    Aspiration Risk  No  limitations    Diet Recommendation Regular;Thin liquid    Liquid Administration via: Straw;Cup Medication Administration: Whole meds with liquid Supervision: Patient able to self feed Postural Changes: Seated upright at 90 degrees    Other  Recommendations Oral Care Recommendations: Oral care BID    Recommendations for follow up therapy are one component of a multi-disciplinary discharge planning process, led by the attending physician.  Recommendations may be updated based on patient status, additional functional criteria and insurance authorization.  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 CT chest showed PNA LUL/LLL. Per chart admitted for sepsis/pneumonia/respiratory failure/NSTEMI. Type of Study: Bedside Swallow Evaluation Previous Swallow Assessment:  (none) Diet Prior to this Study: Regular;Thin liquids (Level 0) Temperature Spikes Noted: No Respiratory Status: Nasal cannula History of Recent Intubation: No Behavior/Cognition: Alert;Cooperative;Pleasant mood Oral Cavity Assessment: Within Functional Limits Oral Care Completed by SLP: No Oral Cavity - Dentition: Adequate natural dentition (says is missing 1 tooth) Vision: Functional for self-feeding Self-Feeding Abilities: Able to feed self Patient Positioning: Other (comment) (on side of bed) Baseline Vocal Quality: Normal Volitional Cough: Strong Volitional Swallow: Able to elicit    Oral/Motor/Sensory Function  Overall Oral Motor/Sensory Function: Within functional limits   Ice Chips Ice chips: Not tested   Thin Liquid Thin Liquid: Within functional limits Presentation: Straw    Nectar Thick Nectar Thick Liquid: Not tested   Honey Thick Honey Thick Liquid: Not tested   Puree Puree: Not tested   Solid     Solid: Within functional limits      Royce Macadamia 02/01/2024,8:40 AM

## 2024-02-02 DIAGNOSIS — R0602 Shortness of breath: Secondary | ICD-10-CM | POA: Diagnosis not present

## 2024-02-02 LAB — COMPREHENSIVE METABOLIC PANEL
ALT: 46 U/L — ABNORMAL HIGH (ref 0–44)
AST: 19 U/L (ref 15–41)
Albumin: 2.4 g/dL — ABNORMAL LOW (ref 3.5–5.0)
Alkaline Phosphatase: 53 U/L (ref 38–126)
Anion gap: 5 (ref 5–15)
BUN: 48 mg/dL — ABNORMAL HIGH (ref 8–23)
CO2: 23 mmol/L (ref 22–32)
Calcium: 8.8 mg/dL — ABNORMAL LOW (ref 8.9–10.3)
Chloride: 110 mmol/L (ref 98–111)
Creatinine, Ser: 2.03 mg/dL — ABNORMAL HIGH (ref 0.61–1.24)
GFR, Estimated: 33 mL/min — ABNORMAL LOW (ref >60)
Glucose, Bld: 166 mg/dL — ABNORMAL HIGH (ref 70–99)
Potassium: 5.8 mmol/L — ABNORMAL HIGH (ref 3.5–5.1)
Sodium: 138 mmol/L (ref 135–145)
Total Bilirubin: 0.3 mg/dL (ref 0.0–1.2)
Total Protein: 5.5 g/dL — ABNORMAL LOW (ref 6.5–8.1)

## 2024-02-02 MED ORDER — SODIUM ZIRCONIUM CYCLOSILICATE 10 G PO PACK
10.0000 g | PACK | Freq: Once | ORAL | Status: AC
  Administered 2024-02-02: 10 g via ORAL
  Filled 2024-02-02: qty 1

## 2024-02-02 MED ORDER — BISACODYL 10 MG RE SUPP: 10.0000 mg | Freq: Every day | RECTAL | Status: DC | PRN

## 2024-02-02 MED ORDER — SENNOSIDES-DOCUSATE SODIUM 8.6-50 MG PO TABS
1.0000 | ORAL_TABLET | Freq: Two times a day (BID) | ORAL | Status: DC
  Administered 2024-02-02 – 2024-02-03 (×3): 1 via ORAL
  Filled 2024-02-02 (×3): qty 1

## 2024-02-02 NOTE — Plan of Care (Signed)

## 2024-02-02 NOTE — Progress Notes (Signed)
 Mobility Specialist Progress Note:    02/02/24 0920  Mobility  Activity Transferred from bed to chair;Ambulated with assistance in hallway;Ambulated with assistance in room  Level of Assistance Contact guard assist, steadying assist  Assistive Device Front wheel walker  Distance Ambulated (ft) 200 ft  Activity Response Tolerated well  Mobility Referral Yes  Mobility visit 1 Mobility  Mobility Specialist Start Time (ACUTE ONLY) 0920  Mobility Specialist Stop Time (ACUTE ONLY) 0933  Mobility Specialist Time Calculation (min) (ACUTE ONLY) 13 min   Pt received in bed, agreeable to mobility session. MinG with RW required for safety with ambulation in hallway. Tolerated well, asx throughout. Unreliable pleth during session. Pt denies SOB. Returned pt to room, SpO2 92% on 4L. Pt agreeable to sit up in chair, left with all needs met, MD in room.    Feliciana Rossetti Mobility Specialist Please contact via Special educational needs teacher or  Rehab office at [phone]

## 2024-02-02 NOTE — Progress Notes (Signed)
 PROGRESS NOTE Jerry [name].  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  Troponin was elevated at [phone].  Lactic acid elevated at 3.3., CXR-patchy left basilar consolidation,CT chest showed PNA LUL/LLL.  Patient was placed on heparin drip, IV antibiotics and admitted for sepsis/pneumonia/respiratory failure/NSTEMI. 3/18> Drop in hemoglobin without any obvious bleeding.  Anemia panel consistent with anemia of chronic disease and some iron deficiency. For ongoing hypoxia management bronchodilators, diuretics, Solu-Medrol started 3/20 due to COPD, and on home dulera as well.  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    Subjective: Patient seen and examined this morning Worked with mobility tech and currently on bedside chair on 4 L [place] Overnight patient afebrile BP stable still on nasal cannula Leg was reviewed shows creatinine at 2.0 but hyperkalemia 5.8 Complains of constipation  Assessment and Plan: Principal Problem:   Shortness of breath Active Problems:   OSA (obstructive sleep apnea)   Asthma, mild intermittent   Benign essential hypertension   Mixed hyperlipidemia   Type 2 MI (myocardial infarction) (HCC)   Sepsis POA due to pneumonia  Acute hypoxic and hypercapnic respiratory failure in the setting of sepsis and pneumonia Suspect COPD exacerbation: Patient febrile tachycardic tachypneic with leukocytosis lactic acidosis on admission.  Continue ceftriaxone x 7 days. Azithromycin completed -further at discontinued due to arrhythmia   On supplemental [place] 4l continue to wean down as tolerated continue to taper steroid, continue Dulera PT OT ambulation aspiration precaution  NSTEMI-type II MI from demand ischemia: Presented with dyspnea exertion dizziness and troponin peaked from 775>>6425>>3410. W/ Coronary calcification on CT s/p cath 3/18-mild nonobstructive CAD.  Continue risk factor modification aspirin 81, beta-blocker  Frequent NSVT: Continue metoprolol adjusted by cardiology.    HTN : BP is controlled. Continue amlodipine and metoprolol.  HCTZ discontinued.    Hyperkalemia: Added Lokelma.  CKD stage IIIb: Renal function at baseline, fluctuating, continue to monitor Recent Labs    06/28/23 0105 12/22/23 0620 12/27/23 1106 01/27/24 1613 01/27/24 1633 01/28/24 0158 01/28/24 0719 01/29/24 0446 01/29/24 1459 01/29/24 1503 01/30/24 0252 01/31/24 0250 02/01/24 0233 02/02/24 0219  BUN 32* 40* 31* 53*  --  47*  --  52*  --   --  46* 44* 48* 48*  CREATININE 1.79* 2.10* 1.96* 2.41*  --  1.92*  --  1.94*  --   --  1.95* 2.10* 2.09* 2.03*  CO2 24 22 21 22   --  19*  --  22  --   --  24 25 24 23   K 4.8 5.6* 4.9 5.1   < > 5.4*   < > 5.0   < > 4.5 4.3 4.9 5.1 5.8*   < > = values in this interval not displayed.    Chronic microcytic anemia B/l hb 7.8 on 06/28/23, 8.7 on 06/26/24.  High in admission likely from hemoconcentration given AKI.   Holding stable in 8 g range.  Monitor nemia panel consistent with anemia of chronic disease and some iron deficiency.  Trend Recent Labs  Lab 01/29/24 1459 01/29/24 1503 01/30/24 0252 01/31/24 0250 02/01/24 0233  HGB 10.9*  10.9* 10.9* 8.5* 8.2*  8.3*  HCT 32.0*  32.0* 32.0* 29.4* 29.2* 29.7*    GAD Depression Insomnia: Mood is stable. Continue home Xanax, Lexapro Zyprexa   Chronic pain: Stable, cont  prn oxcodone   Vascular dementia: continue to monitor.   Deconditioning/debility: Cont ptot   DVT prophylaxis: enoxaparin (LOVENOX) injection 30 mg Start:  01/30/24 0800 SCDs Start: 01/27/24 2226 Code Status:   Code Status: Full Code Family Communication: plan of care discussed with patient.  No family at bedside, wife is usually here later Patient wife was updated extensively 3/21 evening  Patient status is: Remains hospitalized because of severity of illness Level of care: Progressive Cardiac   Dispo: The patient is from: home w/ wife            Anticipated disposition: Home health with PT OT  ~ 24 hrs if respiratory status stable  Objective: Vitals last 24 hrs: Vitals:   02/01/24 2329 02/02/24 0200 02/02/24 0349 02/02/24 0724  BP: 113/69 104/82 113/68 114/69  Pulse: 75 (!) 57 76   Resp: 17 14 17    Temp: 97.8 F (36.6 C)  98.6 F (37 C) 97.9 F (36.6 C)  TempSrc: Oral  Oral Oral  SpO2: 96% 99% 98%   Weight:      Height:       Weight change:   Physical Examination: General exam: alert awake, weak frail HEENT:Oral mucosa moist, Ear/Nose WNL grossly Respiratory system: Bilaterally clear BS,no use of accessory muscle Cardiovascular system: S1 & S2 +, No JVD. Gastrointestinal system: Abdomen soft,NT,ND, BS+ Nervous System: Alert, awake, moving all extremities,and following commands. Extremities: LE edema neg,distal peripheral pulses palpable and warm.  Skin: No rashes,no icterus. MSK: Normal muscle bulk,tone, power   Medications reviewed:  Scheduled Meds:  amLODipine  10 mg Oral QHS   aspirin EC  81 mg Oral Daily   atorvastatin  20 mg Oral Daily   enoxaparin (LOVENOX) injection  30 mg Subcutaneous Q24H   escitalopram  5 mg Oral Daily   ferrous sulfate  325 mg Oral Q breakfast   fluticasone  2 spray Each Nare Daily   furosemide  20 mg Oral Daily   methylPREDNISolone (SOLU-MEDROL) injection  60 mg Intravenous Q24H   metoprolol tartrate  50 mg Oral BID   mometasone-formoterol  2 puff Inhalation BID   senna-docusate  1 tablet Oral BID   sodium chloride flush  3 mL Intravenous Q12H   sodium zirconium cyclosilicate  10 g  Oral Once   Continuous Infusions:  cefTRIAXone (ROCEPHIN)  IV 2 g (02/01/24 2055)    Diet Order             Diet Heart Room service appropriate? Yes; Fluid consistency: Thin  Diet effective now                  Intake/Output Summary (Last 24 hours) at 02/02/2024 0839 Last data filed at 02/02/2024 0351 Gross per 24 hour  Intake 3 ml  Output 2400 ml  Net -2397 ml   Net IO Since Admission: -6,921.52 mL [02/02/24 0839]  Wt Readings from Last 3 Encounters:  01/28/24 72.5 kg  12/22/23 72.6 kg  12/13/23 79.5 kg     Unresulted Labs (From admission, onward)     Start     Ordered   02/05/24 0500  Creatinine, serum  (enoxaparin (LOVENOX)    CrCl < 30 ml/min)  Once,   R       Comments: while on enoxaparin therapy.    01/29/24 1532  Data Reviewed: I have personally reviewed following labs and imaging studies CBC: Recent Labs  Lab 01/27/24 1613 01/27/24 1633 01/28/24 0432 01/28/24 0719 01/29/24 0446 01/29/24 1459 01/29/24 1503 01/30/24 0252 01/31/24 0250 02/01/24 0233  WBC 24.5*  --  22.4*  --  19.5*  --   --  16.5* 13.5* 10.4  NEUTROABS 22.1*  --   --   --   --   --   --   --   --   --   HGB 11.1*   < > 10.6*   < > 9.6* 10.9*  10.9* 10.9* 8.5* 8.2* 8.3*  HCT 40.6   < > 37.6*   < > 33.2* 32.0*  32.0* 32.0* 29.4* 29.2* 29.7*  MCV 77.3*  --  74.8*  --  74.4*  --   --  74.2* 75.3* 75.0*  PLT 233  --  210  --  193  --   --  164 155 169   < > = values in this interval not displayed.   Basic Metabolic Panel:  Recent Labs  Lab 01/29/24 0446 01/29/24 1459 01/29/24 1503 01/30/24 0252 01/31/24 0250 02/01/24 0233 02/02/24 0219  NA 142   < > 140 139 141 139 138  K 5.0   < > 4.5 4.3 4.9 5.1 5.8*  CL 110  --   --  110 112* 109 110  CO2 22  --   --  24 25 24 23   GLUCOSE 127*  --   --  106* 97 175* 166*  BUN 52*  --   --  46* 44* 48* 48*  CREATININE 1.94*  --   --  1.95* 2.10* 2.09* 2.03*  CALCIUM 8.8*  --   --  8.6* 8.8* 8.8* 8.8*  MG 1.6*  --   --  2.0  1.9  --   --    < > = values in this interval not displayed.   GFR: Estimated Creatinine Clearance: 29.8 mL/min (A) (by C-G formula based on SCr of 2.03 mg/dL (H)). Liver Function Tests:  Recent Labs  Lab 01/29/24 0446 01/30/24 0252 01/31/24 0250 02/01/24 0233 02/02/24 0219  AST 34 19 17 15 19   ALT 128* 85* 63* 54* 46*  ALKPHOS 53 50 49 50 53  BILITOT 0.7 0.5 0.4 0.4 0.3  PROT 5.4* 5.1* 4.9* 5.4* 5.5*  ALBUMIN 2.6* 2.4* 2.3* 2.3* 2.4*   No results for input(s): "CHOL", "HDL", "LDLCALC", "TRIG", "CHOLHDL", "LDLDIRECT" in the last 72 hours. Sepsis Labs: Recent Labs  Lab 01/28/24 0158 01/28/24 0430 01/28/24 0718 01/28/24 1412  LATICACIDVEN 2.2* 3.3* 0.9 1.9   Recent Results (from the past 240 hours)  Resp panel by RT-PCR (RSV, Flu A&B, Covid) Anterior Nasal Swab     Status: None   Collection Time: 01/27/24  4:13 PM   Specimen: Anterior Nasal Swab  Result Value Ref Range Status   SARS Coronavirus 2 by RT PCR NEGATIVE NEGATIVE Final   Influenza A by PCR NEGATIVE NEGATIVE Final   Influenza B by PCR NEGATIVE NEGATIVE Final    Comment: (NOTE) The Xpert Xpress SARS-CoV-2/FLU/RSV plus assay is intended as an aid in the diagnosis of influenza from Nasopharyngeal swab specimens and should not be used as a sole basis for treatment. Nasal washings and aspirates are unacceptable for Xpert Xpress SARS-CoV-2/FLU/RSV testing.  Fact Sheet for Patients: BloggerCourse.com  Fact Sheet for Healthcare Providers: SeriousBroker.it  This test is not yet approved or cleared by the [place] FDA and has  been authorized for detection and/or diagnosis of SARS-CoV-2 by FDA under an Emergency Use Authorization (EUA). This EUA will remain in effect (meaning this test can be used) for the duration of the COVID-19 declaration under Section 564(b)(1) of the Act, 21 U.S.C. section 360bbb-3(b)(1), unless the authorization is terminated  or revoked.     Resp Syncytial Virus by PCR NEGATIVE NEGATIVE Final    Comment: (NOTE) Fact Sheet for Patients: BloggerCourse.com  Fact Sheet for Healthcare Providers: SeriousBroker.it  This test is not yet approved or cleared by the [place] FDA and has been authorized for detection and/or diagnosis of SARS-CoV-2 by FDA under an Emergency Use Authorization (EUA). This EUA will remain in effect (meaning this test can be used) for the duration of the COVID-19 declaration under Section 564(b)(1) of the Act, 21 U.S.C. section 360bbb-3(b)(1), unless the authorization is terminated or revoked.  Performed at [facility], [address], [place], [place] [address]   Blood culture (routine x 2)     Status: None   Collection Time: 01/27/24  9:00 PM   Specimen: BLOOD RIGHT ARM  Result Value Ref Range Status   Specimen Description BLOOD RIGHT ARM  Final   Special Requests   Final    BOTTLES DRAWN AEROBIC AND ANAEROBIC Blood Culture results may not be optimal due to an inadequate volume of blood received in culture bottles   Culture   Final    NO GROWTH 5 DAYS Performed at [facility], [address], [place], [place] [address]    Report Status 02/01/2024 FINAL  Final  Blood culture (routine x 2)     Status: None   Collection Time: 01/27/24  9:05 PM   Specimen: BLOOD LEFT ARM  Result Value Ref Range Status   Specimen Description BLOOD LEFT ARM  Final   Special Requests   Final    BOTTLES DRAWN AEROBIC AND ANAEROBIC Blood Culture results may not be optimal due to an inadequate volume of blood received in culture bottles   Culture   Final    NO GROWTH 5 DAYS Performed at [facility], [address], [place], [place] [address]    Report Status 02/01/2024 FINAL  Final  Antimicrobials/Microbiology: Anti-infectives (From admission, onward)    Start     Dose/Rate Route Frequency Ordered Stop   01/28/24 2200   cefTRIAXone (ROCEPHIN) 2 g in sodium chloride 0.9 % 100 mL IVPB        2 g 200 mL/hr over 30 Minutes Intravenous Every 24 hours 01/27/24 2245 02/02/24 2359   01/27/24 2300  azithromycin (ZITHROMAX) tablet 500 mg  Status:  Discontinued        500 mg Oral Daily at bedtime 01/27/24 2245 01/31/24 0820   01/27/24 1745  cefTRIAXone (ROCEPHIN) 1 g in sodium chloride 0.9 % 100 mL IVPB        1 g 200 mL/hr over 30 Minutes Intravenous  Once 01/27/24 1732 01/27/24 2154   01/27/24 1745  azithromycin (ZITHROMAX) 500 mg in sodium chloride 0.9 % 250 mL IVPB        500 mg 250 mL/hr over 60 Minutes Intravenous  Once 01/27/24 1732 01/27/24 2317         Component Value Date/Time   SDES BLOOD LEFT ARM 01/27/2024 2105   SPECREQUEST  01/27/2024 2105    BOTTLES DRAWN AEROBIC AND ANAEROBIC Blood Culture results may not be optimal due to an inadequate volume of blood received in culture bottles  CULT  01/27/2024 2105    NO GROWTH 5 DAYS Performed at [facility], [address], [place], [place] [address]    REPTSTATUS 02/01/2024 FINAL 01/27/2024 2105  Radiology Studies: [facility] Chest Port 1 View Result Date: 01/31/2024 CLINICAL DATA:  Shortness of breath. EXAM: PORTABLE CHEST 1 VIEW COMPARISON:  January 27, 2024. FINDINGS: Stable cardiomediastinal silhouette. Right lung is clear. Mild left basilar atelectasis or infiltrate is noted with possible small pleural effusion. Bony thorax is unremarkable. IMPRESSION: Mild left basilar atelectasis or infiltrate is noted with small left pleural effusion. Electronically Signed   By: Lupita Raider M.D.   On: 01/31/2024 15:00    LOS: 6 days   Total time spent in review of labs and imaging, patient evaluation, formulation of plan, documentation and communication with family: 35 minutes  Lanae Boast, MD  [facility]  02/02/2024, 8:39 AM

## 2024-02-03 ENCOUNTER — Inpatient Hospital Stay (HOSPITAL_COMMUNITY)

## 2024-02-03 DIAGNOSIS — R0602 Shortness of breath: Secondary | ICD-10-CM | POA: Diagnosis not present

## 2024-02-03 LAB — BASIC METABOLIC PANEL
Anion gap: 13 (ref 5–15)
BUN: 51 mg/dL — ABNORMAL HIGH (ref 8–23)
CO2: 19 mmol/L — ABNORMAL LOW (ref 22–32)
Calcium: 9.1 mg/dL (ref 8.9–10.3)
Chloride: 103 mmol/L (ref 98–111)
Creatinine, Ser: 2.14 mg/dL — ABNORMAL HIGH (ref 0.61–1.24)
GFR, Estimated: 31 mL/min — ABNORMAL LOW (ref >60)
Glucose, Bld: 236 mg/dL — ABNORMAL HIGH (ref 70–99)
Potassium: 4.8 mmol/L (ref 3.5–5.1)
Sodium: 135 mmol/L (ref 135–145)

## 2024-02-03 MED ORDER — METOPROLOL TARTRATE 50 MG PO TABS: 50.0000 mg | ORAL_TABLET | Freq: Two times a day (BID) | ORAL | 0 refills | Status: DC

## 2024-02-03 MED ORDER — ASPIRIN 81 MG PO TBEC: 81.0000 mg | DELAYED_RELEASE_TABLET | Freq: Every day | ORAL | 0 refills | Status: DC

## 2024-02-03 MED ORDER — BISACODYL 10 MG RE SUPP: 10.0000 mg | Freq: Once | RECTAL | Status: DC

## 2024-02-03 MED ORDER — SENNOSIDES-DOCUSATE SODIUM 8.6-50 MG PO TABS: 1.0000 | ORAL_TABLET | Freq: Two times a day (BID) | ORAL | 0 refills | Status: DC | PRN

## 2024-02-03 MED ORDER — PREDNISONE 10 MG PO TABS: ORAL_TABLET | ORAL | 0 refills | Status: DC

## 2024-02-03 MED ORDER — ATORVASTATIN CALCIUM 20 MG PO TABS: 20.0000 mg | ORAL_TABLET | Freq: Every day | ORAL | 0 refills | Status: AC

## 2024-02-03 NOTE — Plan of Care (Signed)
 Problem: Education: Goal: Knowledge of General Education information will improve Description: Including pain rating scale, medication(s)/side effects and non-pharmacologic comfort measures 02/03/2024 1820 by Anders Simmonds, RN Outcome: Adequate for Discharge 02/03/2024 1820 by Anders Simmonds, RN Outcome: Progressing   Problem: Health Behavior/Discharge Planning: Goal: Ability to manage health-related needs will improve 02/03/2024 1820 by Anders Simmonds, RN Outcome: Adequate for Discharge 02/03/2024 1820 by Anders Simmonds, RN Outcome: Progressing   Problem: Clinical Measurements: Goal: Ability to maintain clinical measurements within normal limits will improve 02/03/2024 1820 by Anders Simmonds, RN Outcome: Adequate for Discharge 02/03/2024 1820 by Anders Simmonds, RN Outcome: Progressing Goal: Will remain free from infection 02/03/2024 1820 by Anders Simmonds, RN Outcome: Adequate for Discharge 02/03/2024 1820 by Anders Simmonds, RN Outcome: Progressing Goal: Diagnostic test results will improve 02/03/2024 1820 by Anders Simmonds, RN Outcome: Adequate for Discharge 02/03/2024 1820 by Anders Simmonds, RN Outcome: Progressing Goal: Respiratory complications will improve 02/03/2024 1820 by Anders Simmonds, RN Outcome: Adequate for Discharge 02/03/2024 1820 by Anders Simmonds, RN Outcome: Progressing Goal: Cardiovascular complication will be avoided 02/03/2024 1820 by Anders Simmonds, RN Outcome: Adequate for Discharge 02/03/2024 1820 by Anders Simmonds, RN Outcome: Progressing   Problem: Activity: Goal: Risk for activity intolerance will decrease 02/03/2024 1820 by Anders Simmonds, RN Outcome: Adequate for Discharge 02/03/2024 1820 by Anders Simmonds, RN Outcome: Progressing   Problem: Nutrition: Goal: Adequate nutrition will be maintained 02/03/2024 1820 by Anders Simmonds, RN Outcome: Adequate for Discharge 02/03/2024 1820 by Anders Simmonds, RN Outcome: Progressing    Problem: Coping: Goal: Level of anxiety will decrease 02/03/2024 1820 by Anders Simmonds, RN Outcome: Adequate for Discharge 02/03/2024 1820 by Anders Simmonds, RN Outcome: Progressing   Problem: Elimination: Goal: Will not experience complications related to bowel motility 02/03/2024 1820 by Anders Simmonds, RN Outcome: Adequate for Discharge 02/03/2024 1820 by Anders Simmonds, RN Outcome: Progressing Goal: Will not experience complications related to urinary retention 02/03/2024 1820 by Anders Simmonds, RN Outcome: Adequate for Discharge 02/03/2024 1820 by Anders Simmonds, RN Outcome: Progressing   Problem: Pain Managment: Goal: General experience of comfort will improve and/or be controlled 02/03/2024 1820 by Anders Simmonds, RN Outcome: Adequate for Discharge 02/03/2024 1820 by Anders Simmonds, RN Outcome: Progressing   Problem: Safety: Goal: Ability to remain free from injury will improve 02/03/2024 1820 by Anders Simmonds, RN Outcome: Adequate for Discharge 02/03/2024 1820 by Anders Simmonds, RN Outcome: Progressing   Problem: Skin Integrity: Goal: Risk for impaired skin integrity will decrease 02/03/2024 1820 by Anders Simmonds, RN Outcome: Adequate for Discharge 02/03/2024 1820 by Anders Simmonds, RN Outcome: Progressing   Problem: Education: Goal: Understanding of CV disease, CV risk reduction, and recovery process will improve 02/03/2024 1820 by Anders Simmonds, RN Outcome: Adequate for Discharge 02/03/2024 1820 by Anders Simmonds, RN Outcome: Progressing Goal: Individualized Educational Video(s) 02/03/2024 1820 by Anders Simmonds, RN Outcome: Adequate for Discharge 02/03/2024 1820 by Anders Simmonds, RN Outcome: Progressing   Problem: Activity: Goal: Ability to return to baseline activity level will improve 02/03/2024 1820 by Anders Simmonds, RN Outcome: Adequate for Discharge 02/03/2024 1820 by Anders Simmonds, RN Outcome: Progressing   Problem:  Cardiovascular: Goal: Ability to achieve and maintain adequate cardiovascular perfusion will improve 02/03/2024 1820 by Anders Simmonds, RN Outcome: Adequate for Discharge 02/03/2024 1820 by Anders Simmonds, RN Outcome: Progressing  Goal: Vascular access site(s) Level 0-1 will be maintained 02/03/2024 1820 by Anders Simmonds, RN Outcome: Adequate for Discharge 02/03/2024 1820 by Anders Simmonds, RN Outcome: Progressing   Problem: Health Behavior/Discharge Planning: Goal: Ability to safely manage health-related needs after discharge will improve 02/03/2024 1820 by Anders Simmonds, RN Outcome: Adequate for Discharge 02/03/2024 1820 by Anders Simmonds, RN Outcome: Progressing

## 2024-02-03 NOTE — TOC Transition Note (Signed)
 Transition of Care [facility]) - Discharge Note   Patient Details  Name: Jerry [name]. MRN: [id number] Date of Birth: 09/09/1941  Transition of Care [facility]) CM/SW Contact:  Lawerance Sabal, RN Phone Number: 02/03/2024, 4:46 PM   Clinical Narrative:     Notified [facility] that patient will DC and ordered home oxygen through [employer]. I t will be delivered to his room  Final next level of care: Home w Home Health Services Barriers to Discharge: No Barriers Identified   Patient Goals and CMS Choice            Discharge Placement                       Discharge Plan and Services Additional resources added to the After Visit Summary for     Discharge Planning Services: CM Consult            DME Arranged: Oxygen DME Agency: [employer] Date DME Agency Contacted: 02/03/24 Time DME Agency Contacted: [phone] Representative spoke with at DME Agency: Vaughan Basta HH Arranged: PT, OT HH Agency: [facility] Date [facility] Agency Contacted: 02/03/24 Time HH Agency Contacted: [id number] Representative spoke with at [facility] Agency: Kandee Keen  Social Drivers of Health (SDOH) Interventions SDOH Screenings   Food Insecurity: No Food Insecurity (01/28/2024)  Housing: Low Risk  (01/28/2024)  Transportation Needs: No Transportation Needs (01/28/2024)  Utilities: Not At Risk (01/28/2024)  Social Connections: Socially Integrated (01/28/2024)  Tobacco Use: Low Risk  (01/27/2024)  Recent Concern: Tobacco Use - Medium Risk (10/31/2023)   Received from [facility]     Readmission Risk Interventions    06/27/2023   12:12 PM  Readmission Risk Prevention Plan  Transportation Screening Complete  PCP or Specialist Appt within 5-7 Days Complete  Home Care Screening Complete  Medication Review (RN CM) Complete

## 2024-02-03 NOTE — Progress Notes (Signed)
 Occupational Therapy Treatment Patient Details Name: Jerry [name]. MRN: [id number] DOB: 06-17-1941 Today's Date: 02/03/2024   History of present illness 83 y.o. male presents to [facility] on 01/27/24 with SOB, dyspnea, and fatigue. Pt with patchy L basilar consolidation, sepsis POA 2/2 PNA. Pt also admitted with NSTEMI, s/p cardiac cath 3/18. 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 Seen for skilled OT treatment session.  Pt. Able to complete bed mobility with S.  In room ambulation with CGA.  Grooming task at sink, initially in standing to wash face but pt. Unable to maintain safe o2 levels, dropping to low 80s on 2 liters. Max encouragement for sitting, rest break, and PLB then rebound to low 90s.  Seated for oral care, low 90s to 88% during this portion of the task.  Reviewed energy conservation and the 5Ps for safety during ADLs. Pt. Able to verbalize and provide examples used at home prior to admission but notable difficulty implementing today when needed. Pt. Vocal about frustration of wanting to go home ASAP and tired of being here at the hospital.  RN aware.        If plan is discharge home, recommend the following:  A little help with walking and/or transfers;A lot of help with bathing/dressing/bathroom;Assistance with cooking/housework;Direct supervision/assist for medications management;Direct supervision/assist for financial management;Assist for transportation;Help with stairs or ramp for entrance   Equipment Recommendations  None recommended by OT    Recommendations for Other Services      Precautions / Restrictions Precautions Precautions: Fall Precaution/Restrictions Comments: watch O2       Mobility Bed Mobility Overal bed mobility: Needs Assistance Bed Mobility: Supine to Sit     Supine to sit: HOB elevated, Supervision           Transfers Overall transfer level: Needs assistance Equipment used: Rolling walker (2 wheels) Transfers: Sit to/from Stand, Bed to chair/wheelchair/BSC Sit to Stand: Contact guard assist     Step pivot transfers: Contact guard assist     General transfer comment: CGA for safety     Balance                                           ADL either performed or assessed with clinical judgement   ADL Overall ADL's : Needs assistance/impaired     Grooming: Wash/dry face;Oral care;Standing;Sitting;Cueing for safety;Cueing for compensatory techniques Grooming Details (indicate cue type and reason): trial in standing, unable to sustain safe o2 levels.  max encouragement to sit for remainer of grooming tasks             Lower Body Dressing: Sitting/lateral leans;Contact guard assist Lower Body Dressing Details (indicate cue type and reason): able to achieve figure 4 but leanging all the way back with trunk support to come back into upright sitting Toilet Transfer: Contact guard assist;Ambulation;Rolling walker (2 wheels) Toilet Transfer Details (indicate cue type and reason): observed with in room ambulation from eob to sink then across room to recliner         Functional mobility during ADLs: Contact guard assist;Rolling walker (2 wheels) General ADL Comments: 5Ps handout provided and reviewed with pt., he described PLB strategies he would use at home when taking rest breaks after taking the tash can to the curb.  verbalized  understanding of compensatory strategies of sitting vs standing for energy conservation and management of o2 but when came time to sit pt. verbalized resistance and frustration.  wanting to finish in standing even with explanation of o2 levels dropping to low 80s and need to rest, reset, and breathe.  pt. perseverating on need to get home "i gotta get home i need to be home" not receptive to instruction or attempts to calm and redirect to PLB and  sitting for remaining grooming tasks.  describing that the soap was going to dry on his face and was trying to grab the washcloth and cont. his task even with max cues to stop and engage in breathing.  attempted to review we could always wash the soap of in a few min. but o2 staying low for extended periods of time are a much more serious situation.  cont. with emotional support and wanting him to understand that the strategies and tech. i was trying to get him to attempt would align with what he was stating which is he wanted to get back to doing things without people being around telling him what to do.  that therapy is the team that is working to help him achieve as much indepencence as possible in a safe healthy way    Extremity/Trunk Assessment              Vision       Perception     Praxis     Communication Communication Communication: No apparent difficulties   Cognition Arousal: Alert Behavior During Therapy: Agitated Cognition: No family/caregiver present to determine baseline             OT - Cognition Comments: pt with history of vascular dementia, needs encouragement to participate                 Following commands: Impaired Following commands impaired: Follows one step commands with increased time, Follows multi-step commands inconsistently      Cueing   Cueing Techniques: Verbal cues, Tactile cues  Exercises      Shoulder Instructions       General Comments      Pertinent Vitals/ Pain       Pain Assessment Pain Assessment: No/denies pain  Home Living                                          Prior Functioning/Environment              Frequency  Min 2X/week        Progress Toward Goals  OT Goals(current goals can now be found in the care plan section)  Progress towards OT goals: Progressing toward goals     Plan      Co-evaluation                 AM-PAC OT "6 Clicks" Daily Activity     Outcome  Measure   Help from another person eating meals?: A Little Help from another person taking care of personal grooming?: A Little Help from another person toileting, which includes using toliet, bedpan, or urinal?: A Little Help from another person bathing (including washing, rinsing, drying)?: A Lot Help from another person to put on and taking off regular upper body clothing?: A Little Help from another person to put on and taking off regular lower body clothing?: A Lot 6  Click Score: 16    End of Session Equipment Utilized During Treatment: Rolling walker (2 wheels);Oxygen  OT Visit Diagnosis: Unsteadiness on feet (R26.81);Other abnormalities of gait and mobility (R26.89);Muscle weakness (generalized) (M62.81);Other symptoms and signs involving cognitive function;Other (comment)   Activity Tolerance Patient tolerated treatment well   Patient Left in chair;with call bell/phone within reach   Nurse Communication Other (comment) (rn states ok to work with pt., spoke to after for session details including pt.s vocal frustration and resistance to rest breaks and breathing during o2 fluctuations). Rn confirmed chair alarm not needed        Time: 0817-0858 OT Time Calculation (min): 41 min  Charges: OT General Charges $OT Visit: 1 Visit OT Treatments $Self Care/Home Management : 38-52 mins  Boneta Lucks, COTA/L Acute Rehabilitation [phone]   Alessandra Bevels Lorraine-COTA/L 02/03/2024, 9:18 AM

## 2024-02-03 NOTE — Progress Notes (Signed)
 Nurse requested Mobility Specialist to perform oxygen saturation test with pt which includes removing pt from oxygen both at rest and while ambulating.  Below are the results from that testing.     Patient Saturations on Room Air at Rest = spO2 94%  Patient Saturations on Room Air while Ambulating = sp02 77% .  Rested and performed pursed lip breathing for 1 minute with sp02 at 55%.  Patient Saturations on 6 Liters of oxygen while Ambulating = sp02 90%  At end of testing pt left in room on 1  Liters of oxygen.  Reported results to nurse.    Feliciana Rossetti Mobility Specialist Please contact via Special educational needs teacher or  Rehab office at [phone]

## 2024-02-03 NOTE — Discharge Summary (Signed)
 Physician Discharge Summary  Jerry [name]. UJW:[id number] DOB: 14-Apr-1941 DOA: 01/27/2024  PCP: [name], MD  Admit date: 01/27/2024 Discharge date: 02/03/2024 Recommendations for Outpatient Follow-up:  Follow up with PCP in 1 weeks-call for appointment Please obtain BMP/CBC in one week  Discharge Dispo: [facility] Discharge Condition: Stable Code Status:   Code Status: Full Code Diet recommendation:  Diet Order             Diet Heart Room service appropriate? 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  Troponin was elevated at [phone].  Lactic acid elevated at 3.3., CXR-patchy left basilar consolidation,CT chest showed PNA LUL/LLL.  Patient was placed on heparin drip, IV antibiotics and admitted for sepsis/pneumonia/respiratory failure/NSTEMI. 3/18> Drop in hemoglobin without any obvious bleeding.  Anemia panel consistent with anemia of chronic disease and some iron deficiency. For ongoing hypoxia management bronchodilators, diuretics, Solu-Medrol started 3/20 due to COPD, and on home dulera as well.  Oxygen requirement has subsequently improved on 1 L [place], doing well with mobility leukocytosis resolved.  At this time patient is eager to go home on 3/23.  Chest x-ray repeated no significant changes.Patient was again mobilized in the afternoon to ensure he is stable for discharge.   Consultation: Cardiology  Procedure: Cardiac cath 3/18: Mild nonobstructive CAD   Discharge Diagnoses:  Principal Problem:   Shortness of breath Active Problems:   OSA (obstructive sleep apnea)   Asthma, mild  intermittent   Benign essential hypertension   Mixed hyperlipidemia   Type 2 MI (myocardial infarction) (HCC)   Sepsis POA due to pneumonia  Acute hypoxic and hypercapnic respiratory failure in the setting of sepsis and pneumonia Suspect COPD exacerbation: Patient febrile tachycardic tachypneic with leukocytosis lactic acidosis on admission.  Patient completed 7 days of ceftriaxone also received azithromycin to complete course.  Initially on 4 L nasal cannula and oxygen requirement improving nicely, check ambulatory pulse ox prior to discharge, may need to go home on oxygen.  Continue steroid taper Dulera bronchodilators   NSTEMI-type II MI from demand ischemia: Presented with dyspnea exertion dizziness and troponin peaked from 775>>6425>>3410. W/ Coronary calcification on CT s/p cath 3/18-mild nonobstructive CAD.  Continue risk factor modification aspirin 81, beta-blocker   Frequent NSVT: Continue metoprolol adjusted by cardiology.     HTN : BP is controlled. Continue amlodipine and metoprolol.  HCTZ discontinued.     Hyperkalemia: Resolved   CKD stage IIIb: Renal function at baseline, fluctuating, continue to monitor and follow-up outpatient closely   Chronic microcytic anemia B/l hb 7.8 on 06/28/23, 8.7 on 06/26/24.  High in admission likely from hemoconcentration given AKI.   Holding stable in 8 g range.  Monitor nemia panel consistent with anemia of chronic disease and some iron deficiency. Cont po iron  GAD Depression Insomnia: Mood is stable. Continue home Xanax, Lexapro Zyprexa   Chronic pain: Stable, cont  prn oxcodone   Vascular dementia: continue to monitor.    Deconditioning/debility: Cont ptot   Subjective: Alert awake oriented resting comfortably on 1 L nasal cannula at rest And  at bedside eager to go home later today  Discharge Exam: Vitals:   02/03/24 0727 02/03/24 1211  BP: (!) 133/96 105/70  Pulse: 81 82  Resp: 18 20  Temp: 97.6 F (36.4 C) (!)  97.2 F (36.2 C)  SpO2: 98% 95%   General: Pt is alert, awake, not in acute distress Cardiovascular: RRR, S1/S2 +, no rubs, no gallops Respiratory: CTA bilaterally, no wheezing, no rhonchi Abdominal: Soft, NT, ND, bowel sounds + Extremities: no edema, no cyanosis  Discharge Instructions  Discharge Instructions     Discharge instructions   Complete by: As directed    Please call call MD or return to ER for similar or worsening recurring problem that brought you to hospital or if any fever,nausea/vomiting,abdominal pain, uncontrolled pain, chest pain,  shortness of breath or any other alarming symptoms.  Please follow-up your doctor as instructed in a week time and call the office for appointment.  Please avoid alcohol, smoking, or any other illicit substance and maintain healthy habits including taking your regular medications as prescribed.  You were cared for by a hospitalist during your hospital stay. If you have any questions about your discharge medications or the care you received while you were in the hospital after you are discharged, you can call the unit and ask to speak with the hospitalist on call if the hospitalist that took care of you is not available.  Once you are discharged, your primary care physician will handle any further medical issues. Please note that NO REFILLS for any discharge medications will be authorized once you are discharged, as it is imperative that you return to your primary care physician (or establish a relationship with a primary care physician if you do not have one) for your aftercare needs so that they can reassess your need for medications and monitor your lab values   Increase activity slowly   Complete by: As directed       Allergies as of 02/03/2024   No Known Allergies      Medication List     STOP taking these medications    furosemide 40 MG tablet Commonly known as: LASIX   hydrochlorothiazide 12.5 MG tablet Commonly known as:  HYDRODIURIL   loperamide 2 MG capsule Commonly known as: IMODIUM   metoprolol succinate 100 MG 24 hr tablet Commonly known as: TOPROL-XL       TAKE these medications    albuterol 108 (90 Base) MCG/ACT inhaler Commonly known as: VENTOLIN HFA Inhale 2 puffs into the lungs every 6 (six) hours as needed for wheezing or shortness of breath.   ALPRAZolam 0.5 MG tablet Commonly known as: XANAX Take 0.5 mg by mouth 2 (two) times daily as needed for anxiety.   amLODipine 10 MG tablet Commonly known as: NORVASC Take 10 mg by mouth at bedtime.   aspirin EC 81 MG tablet Take 1 tablet (81 mg total) by mouth daily. Swallow whole. Start taking on: February 04, 2024   atorvastatin 20 MG tablet Commonly known as: LIPITOR Take 1 tablet (20 mg total) by mouth daily. Start taking on: February 04, 2024   Cholecalciferol 25 MCG (1000 UT) tablet Take 1,000 Units by mouth daily.   docusate sodium 100 MG capsule Commonly known as: COLACE Take 100 mg by mouth daily as needed for mild constipation.   Dulera 200-5 MCG/ACT Aero Generic drug: mometasone-formoterol Inhale 2 puffs into the lungs 2 (two) times daily.   escitalopram 5 MG tablet Commonly known as: LEXAPRO Take  1 tablet (5 mg total) by mouth daily.   Folivane-Plus Caps Take 1 capsule by mouth daily.   ipratropium-albuterol 0.5-2.5 (3) MG/3ML Soln Commonly known as: DUONEB Inhale 3 mLs into the lungs every 2 (two) hours as needed.   metoprolol tartrate 50 MG tablet Commonly known as: LOPRESSOR Take 1 tablet (50 mg total) by mouth 2 (two) times daily.   OLANZapine 2.5 MG tablet Commonly known as: ZyPREXA Take 1 tablet (2.5 mg total) by mouth 2 (two) times daily as needed (agitation).   predniSONE 10 MG tablet Commonly known as: DELTASONE Take PO 4 tabs daily x 2 days,3 tabs daily x 2 days,2 tabs daily x 2 days,1 tab daily x 2 days then stop.   senna-docusate 8.6-50 MG tablet Commonly known as: Senokot-S Take 1 tablet by  mouth 2 (two) times daily as needed for mild constipation.   VITAMIN B12 PO Take 1 tablet by mouth daily.               Durable Medical Equipment  (From admission, onward)           Start     Ordered   02/03/24 1504  For home use only DME oxygen  Once       Question Answer Comment  Length of Need Lifetime   Mode or (Route) Nasal cannula   Liters per Minute 2   Oxygen delivery system Gas      02/03/24 1503            Follow-up Information     [name], MD Follow up in 1 week(s).   Specialty: Internal Medicine Contact information: [address] [place] [address] [phone]                No Known Allergies  The results of significant diagnostics from this hospitalization (including imaging, microbiology, ancillary and laboratory) are listed below for reference.    Microbiology: Recent Results (from the past 240 hours)  Resp panel by RT-PCR (RSV, Flu A&B, Covid) Anterior Nasal Swab     Status: None   Collection Time: 01/27/24  4:13 PM   Specimen: Anterior Nasal Swab  Result Value Ref Range Status   SARS Coronavirus 2 by RT PCR NEGATIVE NEGATIVE Final   Influenza A by PCR NEGATIVE NEGATIVE Final   Influenza B by PCR NEGATIVE NEGATIVE Final    Comment: (NOTE) The Xpert Xpress SARS-CoV-2/FLU/RSV plus assay is intended as an aid in the diagnosis of influenza from Nasopharyngeal swab specimens and should not be used as a sole basis for treatment. Nasal washings and aspirates are unacceptable for Xpert Xpress SARS-CoV-2/FLU/RSV testing.  Fact Sheet for Patients: BloggerCourse.com  Fact Sheet for Healthcare Providers: SeriousBroker.it  This test is not yet approved or cleared by the [place] FDA and has been authorized for detection and/or diagnosis of SARS-CoV-2 by FDA under an Emergency Use Authorization (EUA). This EUA will remain in effect (meaning this test  can be used) for the duration of the COVID-19 declaration under Section 564(b)(1) of the Act, 21 U.S.C. section 360bbb-3(b)(1), unless the authorization is terminated or revoked.     Resp Syncytial Virus by PCR NEGATIVE NEGATIVE Final    Comment: (NOTE) Fact Sheet for Patients: BloggerCourse.com  Fact Sheet for Healthcare Providers: SeriousBroker.it  This test is not yet approved or cleared by the [place] FDA and has been authorized for detection and/or diagnosis of SARS-CoV-2 by FDA under an Emergency Use Authorization (EUA). This EUA will  remain in effect (meaning this test can be used) for the duration of the COVID-19 declaration under Section 564(b)(1) of the Act, 21 U.S.C. section 360bbb-3(b)(1), unless the authorization is terminated or revoked.  Performed at [facility], [address] [place], [place] [address]   Blood culture (routine x 2)     Status: None   Collection Time: 01/27/24  9:00 PM   Specimen: BLOOD RIGHT ARM  Result Value Ref Range Status   Specimen Description BLOOD RIGHT ARM  Final   Special Requests   Final    BOTTLES DRAWN AEROBIC AND ANAEROBIC Blood Culture results may not be optimal due to an inadequate volume of blood received in culture bottles   Culture   Final    NO GROWTH 5 DAYS Performed at [facility], [address], [place], [place] [address]    Report Status 02/01/2024 FINAL  Final  Blood culture (routine x 2)     Status: None   Collection Time: 01/27/24  9:05 PM   Specimen: BLOOD LEFT ARM  Result Value Ref Range Status   Specimen Description BLOOD LEFT ARM  Final   Special Requests   Final    BOTTLES DRAWN AEROBIC AND ANAEROBIC Blood Culture results may not be optimal due to an inadequate volume of blood received in culture bottles   Culture   Final    NO GROWTH 5 DAYS Performed at [facility], [address] [place], [place] [address]    Report Status  02/01/2024 FINAL  Final    Procedures/Studies: DG Chest Port 1 View Result Date: 02/03/2024 CLINICAL DATA:  Pneumonia. EXAM: PORTABLE CHEST 1 VIEW COMPARISON:  01/31/2024 and CT chest 01/27/2024. FINDINGS: Patient is slightly rotated. Trachea is midline. Heart is enlarged, stable. Mild bibasilar streaky opacities, left greater than right. Small left pleural effusion. IMPRESSION: 1. Patchy bibasilar atelectasis, left greater than right. Difficult to exclude residual pneumonia when compared with 01/27/2024. 2. Small left pleural effusion. Electronically Signed   By: Leanna Battles M.D.   On: 02/03/2024 12:09   DG Chest Port 1 View Result Date: 01/31/2024 CLINICAL DATA:  Shortness of breath. EXAM: PORTABLE CHEST 1 VIEW COMPARISON:  January 27, 2024. FINDINGS: Stable cardiomediastinal silhouette. Right lung is clear. Mild left basilar atelectasis or infiltrate is noted with possible small pleural effusion. Bony thorax is unremarkable. IMPRESSION: Mild left basilar atelectasis or infiltrate is noted with small left pleural effusion. Electronically Signed   By: Lupita Raider M.D.   On: 01/31/2024 15:00   CARDIAC CATHETERIZATION Result Date: 01/29/2024   Prox RCA lesion is 25% stenosed.   LV end diastolic pressure is mildly elevated.   Hemodynamic findings consistent with moderate pulmonary hypertension. Minimal nonobstructive CAD Mildly elevated LV filling pressures. LVEDP 19 mm Hg. PCWP 24/27 with mean 19 mm Hg Moderate pulmonary HTN. PAP 65/30 with mean 40 mm Hg Cardiac output 5.49 L/min, index 2.83 Plan: medical management. Type 2 NSTEMI   PERIPHERAL VASCULAR CATHETERIZATION Result Date: 01/29/2024   Prox RCA lesion is 25% stenosed.   LV end diastolic pressure is mildly elevated.   Hemodynamic findings consistent with moderate pulmonary hypertension. Minimal nonobstructive CAD Mildly elevated LV filling pressures. LVEDP 19 mm Hg. PCWP 24/27 with mean 19 mm Hg Moderate pulmonary HTN. PAP 65/30 with mean 40  mm Hg Cardiac output 5.49 L/min, index 2.83 Plan: medical management. Type 2 NSTEMI   VAS [place] LOWER EXTREMITY VENOUS (DVT) (7a-7p) Result Date: 01/28/2024  Lower  Venous DVT Study Patient Name:  Jerry [name].  Date of Exam:   01/27/2024 Medical Rec #: [id number]          Accession #:    [id number] Date of Birth: [birth year]           Patient Gender: M Patient Age:   10 years Exam Location:  [facility] Procedure:      VAS [place] LOWER EXTREMITY VENOUS (DVT) Referring Phys: Gwyneth Sprout --------------------------------------------------------------------------------  Indications: Edema, Syncope, and SOB.  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 Comparison Study: No prior study on file Performing Technologist: Sherren Kerns RVS  Examination Guidelines: A complete evaluation includes B-mode imaging, spectral Doppler, color Doppler, and power Doppler as needed of all accessible portions of each vessel. Bilateral testing is considered an integral part of a complete examination. Limited examinations for reoccurring indications may be performed as noted. The reflux portion of the exam is performed with the patient in reverse Trendelenburg.  +---------+---------------+---------+-----------+---------------+-------------+ RIGHT    CompressibilityPhasicitySpontaneityProperties     Thrombus                                                                 Aging         +---------+---------------+---------+-----------+---------------+-------------+ CFV      Full           Yes      Yes        Pulsatile                                                                waveform                     +---------+---------------+---------+-----------+---------------+-------------+ SFJ      Full                                                             +---------+---------------+---------+-----------+---------------+-------------+ FV Prox  Full                                                            +---------+---------------+---------+-----------+---------------+-------------+ FV Mid   Full                                                            +---------+---------------+---------+-----------+---------------+-------------+ FV DistalFull                                                            +---------+---------------+---------+-----------+---------------+-------------+  PFV      Full                                                            +---------+---------------+---------+-----------+---------------+-------------+ POP      Full           Yes      No         Pulsatile                                                                waveform                     +---------+---------------+---------+-----------+---------------+-------------+ PTV      Full                                                            +---------+---------------+---------+-----------+---------------+-------------+ PERO     Full                                                            +---------+---------------+---------+-----------+---------------+-------------+ Gastroc  Full                                                            +---------+---------------+---------+-----------+---------------+-------------+   +----+---------------+---------+-----------+------------------+--------------+ LEFTCompressibilityPhasicitySpontaneityProperties        Thrombus Aging +----+---------------+---------+-----------+------------------+--------------+ CFV Full           Yes      No         Pulsatile waveform               +----+---------------+---------+-----------+------------------+--------------+     Summary: RIGHT: - There is no evidence of deep vein thrombosis in the lower extremity.  - No cystic  structure found in the popliteal fossa. Pulsatile waveforms throughout. Interstitial edema noted throughout right lower extremity.  LEFT: - No evidence of common femoral vein obstruction.  Pulsatile waveform.  *See table(s) above for measurements and observations. Electronically signed by Gerarda Fraction on 01/28/2024 at 5:41:41 PM.    Final    CT CHEST ABDOMEN PELVIS WO CONTRAST Result Date: 01/27/2024 CLINICAL DATA:  Sepsis EXAM: CT CHEST, ABDOMEN AND PELVIS WITHOUT CONTRAST TECHNIQUE: Multidetector CT imaging of the chest, abdomen and pelvis was performed following the standard protocol without IV contrast. RADIATION DOSE REDUCTION: This exam was performed according to the departmental dose-optimization program which includes automated exposure control, adjustment of the mA and/or kV according to patient size and/or use of iterative reconstruction technique. COMPARISON:  Chest x-ray today.  Chest CT 04/17/2015  FINDINGS: CT CHEST FINDINGS Cardiovascular: Heart is borderline in size. Scattered coronary artery and aortic calcifications. No evidence of aortic aneurysm. Mediastinum/Nodes: No mediastinal, hilar, or axillary adenopathy. Trachea and esophagus are unremarkable. Thyroid unremarkable. Lungs/Pleura: Mild centrilobular emphysema. Airspace opacities noted in the left upper lobe and left lower lobe most compatible with pneumonia. Nodular area in the left upper lobe measuring up to 1.6 cm likely also related to the infectious process, but follow-up recommended after treatment to ensure resolution. Mild peribronchial thickening. No confluent opacity on the right. No effusions. Musculoskeletal: Chest wall soft tissues are unremarkable. No acute bony abnormality. CT ABDOMEN PELVIS FINDINGS Hepatobiliary: Low-density lesion in the inferior right hepatic lobe measures up to 4.5 cm and is stable since prior study most compatible with cysts. Prior cholecystectomy. No suspicious focal hepatic abnormality or biliary  ductal dilatation. Pancreas: No focal abnormality or ductal dilatation. Spleen: No focal abnormality.  Normal size. Adrenals/Urinary Tract: Adrenal glands normal. 5.5 cm lesion in the upper pole of the left kidney is isodense to the kidney. This is slightly larger than prior study, but most likely reflects a hemorrhagic or proteinaceous cyst. Numerous bilateral low-density lesions in the kidneys are most compatible with cysts. No follow-up imaging recommended. No hydronephrosis. Right lower quadrant urinary diversion. Prior cystectomy. Stomach/Bowel: Normal appendix. Stomach, large and small bowel grossly unremarkable. Small supraumbilical ventral hernia contains the anterior wall of the transverse colon. No bowel obstruction. Vascular/Lymphatic: Aortoiliac atherosclerosis. No evidence of aneurysm or adenopathy. Reproductive: No visible focal abnormality. Other: No free fluid or free air. Musculoskeletal: Postoperative and degenerative changes in the lumbar spine. No acute bony abnormality. IMPRESSION: Patchy airspace opacity in the left upper lobe and left lower lobe most compatible with pneumonia. Nodular component in the left upper lobe most likely also related to pneumonia, but recommend follow-up CT in 3-6 months after treatment to ensure resolution. Mild emphysema. Coronary artery disease, aortic atherosclerosis. No acute findings in the abdomen or pelvis. Electronically Signed   By: Charlett Nose M.D.   On: 01/27/2024 20:14   DG Chest Port 1 View Result Date: 01/27/2024 CLINICAL DATA:  Near syncope, short of breath EXAM: PORTABLE CHEST 1 VIEW COMPARISON:  06/26/2023 FINDINGS: Single frontal view of the chest demonstrates an enlarged cardiac silhouette. There is patchy left basilar consolidation, increased since prior exam. No effusion or pneumothorax. No acute bony abnormalities. IMPRESSION: 1. Patchy left basilar consolidation, slightly more pronounced than prior study. While this could reflect recurrent  aspiration or persistent infection, lack of documented interval clearance may warrant further evaluation with CT. 2. Enlarged cardiac silhouette. Electronically Signed   By: Sharlet Salina M.D.   On: 01/27/2024 16:27   ECHOCARDIOGRAM COMPLETE Result Date: 01/07/2024    ECHOCARDIOGRAM REPORT   Patient Name:   Jerry [name]. Date of Exam: 01/07/2024 Medical Rec #:  [id number]         Height:       72.0 in Accession #:    [id number]        Weight:       160.0 lb Date of Birth:  [birth year]          BSA:          1.938 m Patient Age:    82 years          BP:           155/82 mmHg Patient Gender: M  HR:           105 bpm. Exam Location:  [address] Procedure: 2D Echo, 3D Echo, Cardiac Doppler and Color Doppler (Both Spectral            and Color Flow Doppler were utilized during procedure). Indications:    R60 Edema  History:        Patient has prior history of Echocardiogram examinations, most                 recent 04/18/2015. CKD; Risk Factors:Hypertension and HLD.  Sonographer:    Clearence Ped RCS Referring Phys: [id number] [facility] PATWARDHAN IMPRESSIONS  1. Right ventricular systolic function is mildly reduced. The right ventricular size is normal. There is severely elevated pulmonary artery systolic pressure. The estimated right ventricular systolic pressure is 63.2 mmHg.  2. Left ventricular ejection fraction, by estimation, is 55 to 60%. Left ventricular ejection fraction by 3D volume is 57 %. The left ventricle has normal function. The left ventricle has no regional wall motion abnormalities. There is moderate left ventricular hypertrophy. Left ventricular diastolic parameters are indeterminate. There is the interventricular septum is flattened in systole and diastole, consistent with right ventricular pressure and volume overload.  3. The mitral valve is normal in structure. Trivial mitral valve regurgitation.  4. The aortic valve is tricuspid. Aortic valve regurgitation is not visualized. No  aortic stenosis is present.  5. The inferior vena cava is normal in size with greater than 50% respiratory variability, suggesting right atrial pressure of 3 mmHg. FINDINGS  Left Ventricle: Left ventricular ejection fraction, by estimation, is 55 to 60%. Left ventricular ejection fraction by 3D volume is 57 %. The left ventricle has normal function. The left ventricle has no regional wall motion abnormalities. Strain imaging was not performed. The left ventricular internal cavity size was normal in size. There is moderate left ventricular hypertrophy. The interventricular septum is flattened in systole and diastole, consistent with right ventricular pressure and volume overload. Left ventricular diastolic parameters are indeterminate. Right Ventricle: The right ventricular size is normal. No increase in right ventricular wall thickness. Right ventricular systolic function is mildly reduced. There is severely elevated pulmonary artery systolic pressure. The tricuspid regurgitant velocity is 3.88 m/s, and with an assumed right atrial pressure of 3 mmHg, the estimated right ventricular systolic pressure is 63.2 mmHg. Left Atrium: Left atrial size was normal in size. Right Atrium: Right atrial size was normal in size. Pericardium: Trivial pericardial effusion is present. Mitral Valve: The mitral valve is normal in structure. Trivial mitral valve regurgitation. Tricuspid Valve: The tricuspid valve is normal in structure. Tricuspid valve regurgitation is trivial. Aortic Valve: The aortic valve is tricuspid. Aortic valve regurgitation is not visualized. No aortic stenosis is present. Pulmonic Valve: The pulmonic valve was not well visualized. Pulmonic valve regurgitation is trivial. Aorta: The aortic root and ascending aorta are structurally normal, with no evidence of dilitation. Venous: The inferior vena cava is normal in size with greater than 50% respiratory variability, suggesting right atrial pressure of 3 mmHg.  IAS/Shunts: The atrial septum is grossly normal. Additional Comments: 3D was performed not requiring image post processing on an independent workstation and was normal.  LEFT VENTRICLE PLAX 2D LVIDd:         4.10 cm         Diastology LVIDs:         2.50 cm         LV e' medial:    11.10  cm/s LV PW:         1.40 cm         LV E/e' medial:  6.7 LV IVS:        1.20 cm         LV e' lateral:   9.46 cm/s LVOT diam:     2.00 cm         LV E/e' lateral: 7.9 LV SV:         52 LV SV Index:   27 LVOT Area:     3.14 cm        3D Volume EF                                LV 3D EF:    Left                                             ventricul                                             ar                                             ejection                                             fraction                                             by 3D                                             volume is                                             57 %.                                 3D Volume EF:                                3D EF:        57 %                                LV EDV:       99 ml  LV ESV:       42 ml                                LV SV:        57 ml RIGHT VENTRICLE RV Basal diam:  3.20 cm RV S prime:     12.50 cm/s TAPSE (M-mode): 1.9 cm RVSP:           63.2 mmHg LEFT ATRIUM             Index        RIGHT ATRIUM           Index LA diam:        3.40 cm 1.75 cm/m   RA Pressure: 3.00 mmHg LA Vol (A2C):   56.2 ml 29.00 ml/m  RA Area:     15.40 cm LA Vol (A4C):   32.0 ml 16.51 ml/m  RA Volume:   39.60 ml  20.44 ml/m LA Biplane Vol: 42.1 ml 21.73 ml/m  AORTIC VALVE LVOT Vmax:   113.00 cm/s LVOT Vmean:  71.200 cm/s LVOT VTI:    0.164 m  AORTA Ao Root diam: 3.70 cm Ao Asc diam:  3.20 cm MITRAL VALVE                TRICUSPID VALVE MV Area (PHT):              TR Peak grad:   60.2 mmHg MV Decel Time:              TR Vmax:        388.00 cm/s MV E velocity: 74.40 cm/s   Estimated RAP:  3.00 mmHg  MV A velocity: 106.00 cm/s  RVSP:           63.2 mmHg MV E/A ratio:  0.70                             SHUNTS                             Systemic VTI:  0.16 m                             Systemic Diam: 2.00 cm Epifanio Lesches MD Electronically signed by Epifanio Lesches MD Signature Date/Time: 01/07/2024/3:39:19 PM    Final     Labs: BNP (last 3 results) Recent Labs    06/26/23 1546 01/27/24 1613 01/31/24 1523  BNP 111.2* 36.8 159.5*   Basic Metabolic Panel: Recent Labs  Lab 01/29/24 0446 01/29/24 1459 01/30/24 0252 01/31/24 0250 02/01/24 0233 02/02/24 0219 02/03/24 0857  NA 142   < > 139 141 139 138 135  K 5.0   < > 4.3 4.9 5.1 5.8* 4.8  CL 110  --  110 112* 109 110 103  CO2 22  --  24 25 24 23  19*  GLUCOSE 127*  --  106* 97 175* 166* 236*  BUN 52*  --  46* 44* 48* 48* 51*  CREATININE 1.94*  --  1.95* 2.10* 2.09* 2.03* 2.14*  CALCIUM 8.8*  --  8.6* 8.8* 8.8* 8.8* 9.1  MG 1.6*  --  2.0 1.9  --   --   --    < > = values in this interval not  displayed.   Liver Function Tests: Recent Labs  Lab 01/29/24 0446 01/30/24 0252 01/31/24 0250 02/01/24 0233 02/02/24 0219  AST 34 19 17 15 19   ALT 128* 85* 63* 54* 46*  ALKPHOS 53 50 49 50 53  BILITOT 0.7 0.5 0.4 0.4 0.3  PROT 5.4* 5.1* 4.9* 5.4* 5.5*  ALBUMIN 2.6* 2.4* 2.3* 2.3* 2.4*   No results for input(s): "LIPASE", "AMYLASE" in the last 168 hours. No results for input(s): "AMMONIA" in the last 168 hours. CBC: Recent Labs  Lab 01/27/24 1613 01/27/24 1633 01/28/24 0432 01/28/24 0719 01/29/24 0446 01/29/24 1459 01/29/24 1503 01/30/24 0252 01/31/24 0250 02/01/24 0233  WBC 24.5*  --  22.4*  --  19.5*  --   --  16.5* 13.5* 10.4  NEUTROABS 22.1*  --   --   --   --   --   --   --   --   --   HGB 11.1*   < > 10.6*   < > 9.6* 10.9*  10.9* 10.9* 8.5* 8.2* 8.3*  HCT 40.6   < > 37.6*   < > 33.2* 32.0*  32.0* 32.0* 29.4* 29.2* 29.7*  MCV 77.3*  --  74.8*  --  74.4*  --   --  74.2* 75.3* 75.0*  PLT 233  --  210   --  193  --   --  164 155 169   < > = values in this interval not displayed.   Cardiac Enzymes: No results for input(s): "CKTOTAL", "CKMB", "CKMBINDEX", "TROPONINI" in the last 168 hours. BNP: Invalid input(s): "POCBNP" CBG: Recent Labs  Lab 02/01/24 1145  GLUCAP 178*   D-Dimer No results for input(s): "DDIMER" in the last 72 hours. Hgb A1c No results for input(s): "HGBA1C" in the last 72 hours. Lipid Profile No results for input(s): "CHOL", "HDL", "LDLCALC", "TRIG", "CHOLHDL", "LDLDIRECT" in the last 72 hours. Thyroid function studies No results for input(s): "TSH", "T4TOTAL", "T3FREE", "THYROIDAB" in the last 72 hours.  Invalid input(s): "FREET3" Anemia work up No results for input(s): "VITAMINB12", "FOLATE", "FERRITIN", "TIBC", "IRON", "RETICCTPCT" in the last 72 hours. Urinalysis    Component Value Date/Time   COLORURINE YELLOW 06/26/2023 1520   APPEARANCEUR CLOUDY (A) 06/26/2023 1520   LABSPEC 1.009 06/26/2023 1520   PHURINE 7.0 06/26/2023 1520   GLUCOSEU NEGATIVE 06/26/2023 1520   HGBUR SMALL (A) 06/26/2023 1520   BILIRUBINUR NEGATIVE 06/26/2023 1520   KETONESUR NEGATIVE 06/26/2023 1520   PROTEINUR NEGATIVE 06/26/2023 1520   UROBILINOGEN 0.2 04/17/2015 1820   NITRITE NEGATIVE 06/26/2023 1520   LEUKOCYTESUR LARGE (A) 06/26/2023 1520   Sepsis Labs Recent Labs  Lab 01/29/24 0446 01/30/24 0252 01/31/24 0250 02/01/24 0233  WBC 19.5* 16.5* 13.5* 10.4   Microbiology Recent Results (from the past 240 hours)  Resp panel by RT-PCR (RSV, Flu A&B, Covid) Anterior Nasal Swab     Status: None   Collection Time: 01/27/24  4:13 PM   Specimen: Anterior Nasal Swab  Result Value Ref Range Status   SARS Coronavirus 2 by RT PCR NEGATIVE NEGATIVE Final   Influenza A by PCR NEGATIVE NEGATIVE Final   Influenza B by PCR NEGATIVE NEGATIVE Final    Comment: (NOTE) The Xpert Xpress SARS-CoV-2/FLU/RSV plus assay is intended as an aid in the diagnosis of influenza from  Nasopharyngeal swab specimens and should not be used as a sole basis for treatment. Nasal washings and aspirates are unacceptable for Xpert Xpress SARS-CoV-2/FLU/RSV testing.  Fact Sheet for Patients: BloggerCourse.com  Fact  Sheet for Healthcare Providers: SeriousBroker.it  This test is not yet approved or cleared by the [place] and has been authorized for detection and/or diagnosis of SARS-CoV-2 by FDA under an Emergency Use Authorization (EUA). This EUA will remain in effect (meaning this test can be used) for the duration of the COVID-19 declaration under Section 564(b)(1) of the Act, 21 U.S.C. section 360bbb-3(b)(1), unless the authorization is terminated or revoked.     Resp Syncytial Virus by PCR NEGATIVE NEGATIVE Final    Comment: (NOTE) Fact Sheet for Patients: BloggerCourse.com  Fact Sheet for Healthcare Providers: SeriousBroker.it  This test is not yet approved or cleared by the [place] FDA and has been authorized for detection and/or diagnosis of SARS-CoV-2 by FDA under an Emergency Use Authorization (EUA). This EUA will remain in effect (meaning this test can be used) for the duration of the COVID-19 declaration under Section 564(b)(1) of the Act, 21 U.S.C. section 360bbb-3(b)(1), unless the authorization is terminated or revoked.  Performed at [facility], [address], [place], [place] [address]   Blood culture (routine x 2)     Status: None   Collection Time: 01/27/24  9:00 PM   Specimen: BLOOD RIGHT ARM  Result Value Ref Range Status   Specimen Description BLOOD RIGHT ARM  Final   Special Requests   Final    BOTTLES DRAWN AEROBIC AND ANAEROBIC Blood Culture results may not be optimal due to an inadequate volume of blood received in culture bottles   Culture   Final    NO GROWTH 5 DAYS Performed at [facility], [address] [place], [place] [address]    Report Status 02/01/2024 FINAL  Final  Blood culture (routine x 2)     Status: None   Collection Time: 01/27/24  9:05 PM   Specimen: BLOOD LEFT ARM  Result Value Ref Range Status   Specimen Description BLOOD LEFT ARM  Final   Special Requests   Final    BOTTLES DRAWN AEROBIC AND ANAEROBIC Blood Culture results may not be optimal due to an inadequate volume of blood received in culture bottles   Culture   Final    NO GROWTH 5 DAYS Performed at [facility], [address] [place], [place] [address]    Report Status 02/01/2024 FINAL  Final   Time coordinating discharge: 35 minutes  SIGNED: Lanae Boast, MD  [facility] 02/03/2024, 3:04 PM  If 7PM-7AM, please contact night-coverage [email or web address]

## 2024-02-12 ENCOUNTER — Ambulatory Visit: Payer: Medicare Other | Attending: Cardiology | Admitting: Cardiology

## 2024-02-12 ENCOUNTER — Other Ambulatory Visit: Payer: Self-pay

## 2024-02-12 ENCOUNTER — Encounter: Payer: Self-pay | Admitting: Cardiology

## 2024-02-12 VITALS — BP 112/60 | HR 83 | Resp 16 | Ht 72.0 in | Wt 176.6 lb

## 2024-02-12 DIAGNOSIS — I21A1 Myocardial infarction type 2: Secondary | ICD-10-CM | POA: Diagnosis present

## 2024-02-12 DIAGNOSIS — I272 Pulmonary hypertension, unspecified: Secondary | ICD-10-CM | POA: Diagnosis not present

## 2024-02-12 MED ORDER — FUROSEMIDE 40 MG PO TABS: 40.0000 mg | ORAL_TABLET | Freq: Every day | ORAL | 3 refills | Status: DC

## 2024-02-12 NOTE — Addendum Note (Signed)
 Addended by: Neita Goodnight B on: 02/12/2024 03:45 PM   Modules accepted: Orders

## 2024-02-12 NOTE — Patient Instructions (Addendum)
 Medication Instructions:  RESTART Lasix 40 mg take one tablet by mouth daily   *If you need a refill on your cardiac medications before your next appointment, please call your pharmacy*  Lab Work in 2 months: BMP ProBNP   If you have labs (blood work) drawn today and your tests are completely normal, you will receive your results only by: MyChart Message (if you have MyChart) OR A paper copy in the mail If you have any lab test that is abnormal or we need to change your treatment, we will call you to review the results.  Follow-Up: At [facility], you and your health needs are our priority.  As part of our continuing mission to provide you with exceptional heart care, our providers are all part of one team.  This team includes your primary Cardiologist (physician) and Advanced Practice Providers or APPs (Physician Assistants and Nurse Practitioners) who all work together to provide you with the care you need, when you need it.  New Oxygen orders  Your next appointment:   3 month(s)  Provider:   Elder Negus, MD     We recommend signing up for the patient portal called "MyChart".  Sign up information is provided on this After Visit Summary.  MyChart is used to connect with patients for Virtual Visits (Telemedicine).  Patients are able to view lab/test results, encounter notes, upcoming appointments, etc.  Non-urgent messages can be sent to your provider as well.   To learn more about what you can do with MyChart, go to [email or web address].   Other Instructions       1st Floor: - Lobby - Registration  - Pharmacy  - Lab - Cafe  2nd Floor: - PV Lab - Diagnostic Testing (echo, CT, nuclear med)  3rd Floor: - Vacant  4th Floor: - TCTS (cardiothoracic surgery) - AFib Clinic - Structural Heart Clinic - Vascular Surgery  - Vascular Ultrasound  5th Floor: - HeartCare Cardiology (general and EP) - Clinical Pharmacy for coumadin, hypertension, lipid,  weight-loss medications, and med management appointments    Valet parking services will be available as well.

## 2024-02-12 NOTE — Progress Notes (Signed)
 Cardiology Office Note:  .   Date:  02/12/2024  ID:  Jerry [name]., DOB [birth year], MRN [id number] PCP: [name], MD  [facility] Providers Cardiologist:  Truett Mainland, MD PCP: [name], MD  Chief Complaint  Patient presents with   Hospitalization Follow-up      History of Present Illness: .    Jerry [name]. is a 83 y.o. 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  His troponin increased to a peak of 6000+.  He underwent right and left heart catheterization that showed nonobstructive coronary artery disease, wedge of 19 mmHg, mean PA pressure of 40 mmHg.  This was thought to be moderate pulmonary hypertension, likely mixed picture with elevated range, but also slightly elevated PVR.  It appears that his Lasix was stopped probably on discharge.  Creatinine has stayed roughly around 2, which is about his baseline.      Vitals:   02/12/24 1309  BP: 112/60  Pulse: 83  Resp: 16  SpO2: 95%      ROS:  Review of Systems  Constitutional: Positive for malaise/fatigue.  Cardiovascular:  Positive for dyspnea on exertion and leg swelling. Negative for chest pain, palpitations and syncope.     Studies Reviewed: [name]        EKG 02/12/2024: Normal sinus rhythm Rightward axis Anterior infarct (cited on or before 31-Jan-2024) When compared with ECG of 31-Jan-2024 08:00, PR interval has decreased QRS axis Shifted right Nonspecific T wave abnormality no longer evident in Inferior leads   Independently interpreted 01/2024: Chol 119, TG 94, HDL 61, LDL 39 HbA1C 6.2% Hb 8.3 Cr 2.14, eGFR 31 Trop HS peak 6425 BNP 159  Right and left heart catheterization 01/29/2024:   Prox RCA lesion is 25% stenosed.   LV  end diastolic pressure is mildly elevated.   Hemodynamic findings consistent with moderate pulmonary hypertension.   Minimal nonobstructive CAD Mildly elevated LV filling pressures. LVEDP 19 mm Hg. PCWP 24/27 with mean 19 mm Hg Moderate pulmonary HTN. PAP 65/30 with mean 40 mm Hg Cardiac output 5.49 L/min, index 2.83   Plan: medical management. Type 2 NSTEMI   Echocardiogram 12/2023:  1. Right ventricular systolic function is mildly reduced. The right  ventricular size is normal. There is severely elevated pulmonary artery  systolic pressure. The estimated right ventricular systolic pressure is  63.2 mmHg.   2. Left ventricular ejection fraction, by estimation, is 55 to 60%. Left  ventricular ejection fraction by 3D volume is 57 %. The left ventricle has  normal function. The left ventricle has no regional wall motion  abnormalities. There is moderate left  ventricular hypertrophy. Left ventricular diastolic parameters are  indeterminate. There is the interventricular septum is flattened in  systole and diastole, consistent with right ventricular pressure and  volume overload.   3. The mitral valve is normal in structure. Trivial mitral valve  regurgitation.   4. The aortic valve is tricuspid. Aortic valve regurgitation is not  visualized. No aortic stenosis is present.   5. The inferior vena cava is normal in size with greater than 50%  respiratory variability, suggesting right atrial pressure of 3 mmHg.       Physical Exam:   Physical Exam Vitals and nursing note reviewed.  Constitutional:      General:  He is not in acute distress. Neck:     Vascular: No JVD.  Cardiovascular:     Rate and Rhythm: Normal rate and regular rhythm.     Heart sounds: Normal heart sounds. No murmur heard. Pulmonary:     Effort: Pulmonary effort is normal.     Breath sounds: Normal breath sounds. No wheezing or rales.  Musculoskeletal:     Right lower leg: Edema (Trace) present.     Left  lower leg: Edema (Trace) present.      VISIT DIAGNOSES:   ICD-10-CM   1. Moderate pulmonary hypertension (HCC)  I27.20 EKG 12-Lead    2. Type 2 MI (myocardial infarction) (HCC)  I21.A1         ASSESSMENT AND PLAN: .    Jerry [name]. is a 83 y.o. 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 Mean PA pressure of 40 mmHg, wedge of 19, PVR 3.4. This suggests mixed picture for etiology of pulmonary hypertension. I recommend resuming Lasix, milligram daily. From addition, he would benefit from supplemental oxygen.  Oxygen is a vasodilator for pulmonary vasculature. Recommend 1-2 L 24/7 oxygen. Will initiate home health order today.  Concentrator for supplemental oxygen. Check BMP and proBNP in 2 months.  I will see him for follow-up after that.  If no improvement in symptoms, could consider referral to heart failure clinic for pulmonary hypertension.  H/o type II MI: Continue aspirin, statin.   F/u in 8-12 weeks  Signed, Elder Negus, MD

## 2024-02-13 ENCOUNTER — Telehealth: Payer: Self-pay | Admitting: Cardiology

## 2024-02-13 NOTE — Telephone Encounter (Signed)
 Patient uses [employer] for oxygen supplies.  Information was sent to [employer] yesterday.  I spoke with patient's wife. She has not heard from oxygen company yet and will contact that office.

## 2024-02-13 NOTE — Telephone Encounter (Signed)
 Wife calling in to see if our office had look into a portable oxygent tank for the patient. Please advise

## 2024-02-14 ENCOUNTER — Telehealth: Payer: Self-pay | Admitting: Cardiology

## 2024-02-14 NOTE — Telephone Encounter (Signed)
 Pt c/o medication issue:  1. Name of Medication:    2. How are you currently taking this medication (dosage and times per day)?  metoprolol tartrate (LOPRESSOR) 50 MG tablet    OLANZapine (ZYPREXA) 2.5 MG tablet    3. Are you having a reaction (difficulty breathing--STAT)?   4. What is your medication issue? Calling to get parameter on medication for patient bp. Please advise

## 2024-02-14 NOTE — Telephone Encounter (Signed)
 Contacted home health.and advised that per verbal from Dr. Rosemary Holms he does not want to add parameters to metoprolol and Zyprexa is not controlled by cardiology. Advised Dr. Rosemary Holms that blood pressure was 112/74 with heart rate of 97 to 105 this morning. Home health advised they are concerned that blood pressure will drop with combo of lasix, metoprolol, and Zyprexa. Dr. Rosemary Holms advised. Per verbal from physician, advised home health nurse again that parameters are not needed and to follow up with PCP concerning Zyprexa.

## 2024-02-14 NOTE — Telephone Encounter (Signed)
 Noted.  Thanks MJP

## 2024-02-14 NOTE — Telephone Encounter (Signed)
 Metoprolol tartrate is scheduled 50 mg twice daily, does not need to be titrated. I do not know anything about olanzapine, defer to PCP.  Thanks MJP

## 2024-02-15 ENCOUNTER — Encounter (HOSPITAL_COMMUNITY): Payer: Self-pay | Admitting: Internal Medicine

## 2024-02-15 ENCOUNTER — Emergency Department (HOSPITAL_COMMUNITY)

## 2024-02-15 ENCOUNTER — Inpatient Hospital Stay (HOSPITAL_COMMUNITY)
Admission: EM | Admit: 2024-02-15 | Discharge: 2024-02-18 | DRG: 175 | Disposition: A | Discharge status: Home Health | Attending: Internal Medicine | Admitting: Internal Medicine

## 2024-02-15 ENCOUNTER — Other Ambulatory Visit: Payer: Self-pay

## 2024-02-15 DIAGNOSIS — G4733 Obstructive sleep apnea (adult) (pediatric): Secondary | ICD-10-CM | POA: Diagnosis present

## 2024-02-15 DIAGNOSIS — Z79899 Other long term (current) drug therapy: Secondary | ICD-10-CM

## 2024-02-15 DIAGNOSIS — Z7901 Long term (current) use of anticoagulants: Secondary | ICD-10-CM

## 2024-02-15 DIAGNOSIS — E1122 Type 2 diabetes mellitus with diabetic chronic kidney disease: Secondary | ICD-10-CM | POA: Diagnosis present

## 2024-02-15 DIAGNOSIS — Z7951 Long term (current) use of inhaled steroids: Secondary | ICD-10-CM

## 2024-02-15 DIAGNOSIS — R0602 Shortness of breath: Principal | ICD-10-CM | POA: Diagnosis present

## 2024-02-15 DIAGNOSIS — I130 Hypertensive heart and chronic kidney disease with heart failure and stage 1 through stage 4 chronic kidney disease, or unspecified chronic kidney disease: Secondary | ICD-10-CM | POA: Diagnosis present

## 2024-02-15 DIAGNOSIS — I2699 Other pulmonary embolism without acute cor pulmonale: Secondary | ICD-10-CM | POA: Diagnosis present

## 2024-02-15 DIAGNOSIS — Z833 Family history of diabetes mellitus: Secondary | ICD-10-CM

## 2024-02-15 DIAGNOSIS — J9621 Acute and chronic respiratory failure with hypoxia: Secondary | ICD-10-CM | POA: Diagnosis present

## 2024-02-15 DIAGNOSIS — D509 Iron deficiency anemia, unspecified: Secondary | ICD-10-CM | POA: Diagnosis present

## 2024-02-15 DIAGNOSIS — I252 Old myocardial infarction: Secondary | ICD-10-CM

## 2024-02-15 DIAGNOSIS — N1832 Chronic kidney disease, stage 3b: Secondary | ICD-10-CM | POA: Diagnosis present

## 2024-02-15 DIAGNOSIS — E875 Hyperkalemia: Secondary | ICD-10-CM | POA: Diagnosis not present

## 2024-02-15 DIAGNOSIS — I272 Pulmonary hypertension, unspecified: Secondary | ICD-10-CM | POA: Diagnosis present

## 2024-02-15 DIAGNOSIS — E782 Mixed hyperlipidemia: Secondary | ICD-10-CM | POA: Diagnosis present

## 2024-02-15 DIAGNOSIS — I2694 Multiple subsegmental pulmonary emboli without acute cor pulmonale: Secondary | ICD-10-CM | POA: Diagnosis not present

## 2024-02-15 DIAGNOSIS — I82432 Acute embolism and thrombosis of left popliteal vein: Secondary | ICD-10-CM | POA: Diagnosis present

## 2024-02-15 DIAGNOSIS — J4520 Mild intermittent asthma, uncomplicated: Secondary | ICD-10-CM | POA: Diagnosis present

## 2024-02-15 DIAGNOSIS — Z8551 Personal history of malignant neoplasm of bladder: Secondary | ICD-10-CM

## 2024-02-15 DIAGNOSIS — J189 Pneumonia, unspecified organism: Secondary | ICD-10-CM | POA: Diagnosis present

## 2024-02-15 DIAGNOSIS — Z8546 Personal history of malignant neoplasm of prostate: Secondary | ICD-10-CM

## 2024-02-15 DIAGNOSIS — Z7982 Long term (current) use of aspirin: Secondary | ICD-10-CM

## 2024-02-15 DIAGNOSIS — Z1152 Encounter for screening for COVID-19: Secondary | ICD-10-CM

## 2024-02-15 DIAGNOSIS — F0150 Vascular dementia without behavioral disturbance: Secondary | ICD-10-CM | POA: Diagnosis present

## 2024-02-15 LAB — CBC
HCT: 30.5 % — ABNORMAL LOW (ref 39.0–52.0)
Hemoglobin: 8.4 g/dL — ABNORMAL LOW (ref 13.0–17.0)
MCH: 21.8 pg — ABNORMAL LOW (ref 26.0–34.0)
MCHC: 27.5 g/dL — ABNORMAL LOW (ref 30.0–36.0)
MCV: 79 fL — ABNORMAL LOW (ref 80.0–100.0)
Platelets: 246 10*3/uL (ref 150–400)
RBC: 3.86 MIL/uL — ABNORMAL LOW (ref 4.22–5.81)
RDW: 22.9 % — ABNORMAL HIGH (ref 11.5–15.5)
WBC: 8.5 10*3/uL (ref 4.0–10.5)
nRBC: 0 % (ref 0.0–0.2)

## 2024-02-15 LAB — HEPATIC FUNCTION PANEL
ALT: 21 U/L (ref 0–44)
AST: 23 U/L (ref 15–41)
Albumin: 2.7 g/dL — ABNORMAL LOW (ref 3.5–5.0)
Alkaline Phosphatase: 44 U/L (ref 38–126)
Bilirubin, Direct: <0.1 mg/dL (ref 0.0–0.2)
Total Bilirubin: 0.4 mg/dL (ref 0.0–1.2)
Total Protein: 5.5 g/dL — ABNORMAL LOW (ref 6.5–8.1)

## 2024-02-15 LAB — BASIC METABOLIC PANEL WITH GFR
Anion gap: 8 (ref 5–15)
BUN: 42 mg/dL — ABNORMAL HIGH (ref 8–23)
CO2: 26 mmol/L (ref 22–32)
Calcium: 8.3 mg/dL — ABNORMAL LOW (ref 8.9–10.3)
Chloride: 110 mmol/L (ref 98–111)
Creatinine, Ser: 2.04 mg/dL — ABNORMAL HIGH (ref 0.61–1.24)
GFR, Estimated: 32 mL/min — ABNORMAL LOW (ref >60)
Glucose, Bld: 74 mg/dL (ref 70–99)
Potassium: 4.8 mmol/L (ref 3.5–5.1)
Sodium: 144 mmol/L (ref 135–145)

## 2024-02-15 LAB — HEMOGLOBIN AND HEMATOCRIT, BLOOD
HCT: 30.2 % — ABNORMAL LOW (ref 39.0–52.0)
Hemoglobin: 8.3 g/dL — ABNORMAL LOW (ref 13.0–17.0)

## 2024-02-15 LAB — TROPONIN I (HIGH SENSITIVITY)
Troponin I (High Sensitivity): 27 ng/L — ABNORMAL HIGH (ref <18)
Troponin I (High Sensitivity): 22 ng/L — ABNORMAL HIGH (ref <18)

## 2024-02-15 LAB — RESP PANEL BY RT-PCR (RSV, FLU A&B, COVID)  RVPGX2
Influenza A by PCR: NEGATIVE
Influenza B by PCR: NEGATIVE
Resp Syncytial Virus by PCR: NEGATIVE
SARS Coronavirus 2 by RT PCR: NEGATIVE

## 2024-02-15 LAB — BRAIN NATRIURETIC PEPTIDE: B Natriuretic Peptide: 48.9 pg/mL (ref 0.0–100.0)

## 2024-02-15 LAB — GLUCOSE, CAPILLARY: Glucose-Capillary: 209 mg/dL — ABNORMAL HIGH (ref 70–99)

## 2024-02-15 LAB — MAGNESIUM: Magnesium: 1.6 mg/dL — ABNORMAL LOW (ref 1.7–2.4)

## 2024-02-15 LAB — POC OCCULT BLOOD, ED: Fecal Occult Bld: POSITIVE — AB

## 2024-02-15 MED ORDER — ACETAMINOPHEN 325 MG PO TABS: 650.0000 mg | ORAL_TABLET | Freq: Four times a day (QID) | ORAL | Status: DC | PRN

## 2024-02-15 MED ORDER — ATORVASTATIN CALCIUM 10 MG PO TABS
20.0000 mg | ORAL_TABLET | Freq: Every day | ORAL | Status: DC
  Administered 2024-02-15 – 2024-02-18 (×4): 20 mg via ORAL
  Filled 2024-02-15 (×4): qty 2

## 2024-02-15 MED ORDER — METOPROLOL TARTRATE 50 MG PO TABS
50.0000 mg | ORAL_TABLET | Freq: Two times a day (BID) | ORAL | Status: DC
  Administered 2024-02-15 – 2024-02-18 (×7): 50 mg via ORAL
  Filled 2024-02-15 (×7): qty 1

## 2024-02-15 MED ORDER — ESCITALOPRAM OXALATE 10 MG PO TABS
5.0000 mg | ORAL_TABLET | Freq: Every day | ORAL | Status: DC
  Administered 2024-02-15 – 2024-02-18 (×4): 5 mg via ORAL
  Filled 2024-02-15 (×4): qty 1

## 2024-02-15 MED ORDER — HEPARIN (PORCINE) 25000 UT/250ML-% IV SOLN
900.0000 [IU]/h | INTRAVENOUS | Status: DC
  Administered 2024-02-15: 1250 [IU]/h via INTRAVENOUS
  Administered 2024-02-16: 1150 [IU]/h via INTRAVENOUS
  Administered 2024-02-17: 900 [IU]/h via INTRAVENOUS
  Filled 2024-02-15 (×3): qty 250

## 2024-02-15 MED ORDER — MAGNESIUM SULFATE 2 GM/50ML IV SOLN
2.0000 g | Freq: Once | INTRAVENOUS | Status: DC
  Filled 2024-02-15: qty 50

## 2024-02-15 MED ORDER — SODIUM CHLORIDE 0.9 % IV SOLN
1.0000 g | INTRAVENOUS | Status: DC
  Administered 2024-02-16: 1 g via INTRAVENOUS
  Filled 2024-02-15: qty 10

## 2024-02-15 MED ORDER — SODIUM CHLORIDE 0.9% FLUSH
3.0000 mL | Freq: Two times a day (BID) | INTRAVENOUS | Status: DC
  Administered 2024-02-15 – 2024-02-17 (×5): 3 mL via INTRAVENOUS

## 2024-02-15 MED ORDER — INSULIN ASPART 100 UNIT/ML IJ SOLN
0.0000 [IU] | Freq: Three times a day (TID) | INTRAMUSCULAR | Status: DC
Start: 2024-02-15 — End: 2024-02-18
  Administered 2024-02-15: 2 [IU] via SUBCUTANEOUS

## 2024-02-15 MED ORDER — ALBUTEROL SULFATE (2.5 MG/3ML) 0.083% IN NEBU
2.5000 mg | INHALATION_SOLUTION | Freq: Four times a day (QID) | RESPIRATORY_TRACT | Status: DC | PRN
  Administered 2024-02-17: 2.5 mg via RESPIRATORY_TRACT
  Filled 2024-02-15: qty 3

## 2024-02-15 MED ORDER — SODIUM CHLORIDE 0.9 % IV SOLN
1.0000 g | Freq: Once | INTRAVENOUS | Status: AC
  Administered 2024-02-15: 1 g via INTRAVENOUS
  Filled 2024-02-15: qty 10

## 2024-02-15 MED ORDER — IOHEXOL 350 MG/ML SOLN
60.0000 mL | Freq: Once | INTRAVENOUS | Status: AC | PRN
  Administered 2024-02-15: 60 mL via INTRAVENOUS

## 2024-02-15 MED ORDER — SODIUM CHLORIDE 0.9 % IV SOLN
500.0000 mg | Freq: Once | INTRAVENOUS | Status: AC
  Administered 2024-02-15: 500 mg via INTRAVENOUS
  Filled 2024-02-15: qty 5

## 2024-02-15 MED ORDER — SODIUM CHLORIDE 0.9 % IV SOLN
500.0000 mg | INTRAVENOUS | Status: DC
  Administered 2024-02-16: 500 mg via INTRAVENOUS
  Filled 2024-02-15: qty 5

## 2024-02-15 MED ORDER — PANTOPRAZOLE SODIUM 40 MG IV SOLR
40.0000 mg | Freq: Two times a day (BID) | INTRAVENOUS | Status: DC
  Administered 2024-02-15 – 2024-02-18 (×6): 40 mg via INTRAVENOUS
  Filled 2024-02-15 (×6): qty 10

## 2024-02-15 MED ORDER — ACETAMINOPHEN 650 MG RE SUPP: 650.0000 mg | Freq: Four times a day (QID) | RECTAL | Status: DC | PRN

## 2024-02-15 MED ORDER — OLANZAPINE 2.5 MG PO TABS: 2.5000 mg | ORAL_TABLET | Freq: Two times a day (BID) | ORAL | Status: DC | PRN

## 2024-02-15 MED ORDER — FUROSEMIDE 10 MG/ML IJ SOLN
40.0000 mg | INTRAMUSCULAR | Status: AC
  Administered 2024-02-15: 40 mg via INTRAVENOUS
  Filled 2024-02-15: qty 4

## 2024-02-15 MED ORDER — HYDROCODONE-ACETAMINOPHEN 5-325 MG PO TABS: 1.0000 | ORAL_TABLET | ORAL | Status: DC | PRN

## 2024-02-15 MED ORDER — MORPHINE SULFATE (PF) 2 MG/ML IV SOLN: 2.0000 mg | INTRAVENOUS | Status: DC | PRN

## 2024-02-15 MED ORDER — MOMETASONE FURO-FORMOTEROL FUM 200-5 MCG/ACT IN AERO
2.0000 | INHALATION_SPRAY | Freq: Two times a day (BID) | RESPIRATORY_TRACT | Status: DC
  Administered 2024-02-15 – 2024-02-18 (×6): 2 via RESPIRATORY_TRACT
  Filled 2024-02-15: qty 8.8

## 2024-02-15 MED ORDER — IPRATROPIUM-ALBUTEROL 0.5-2.5 (3) MG/3ML IN SOLN: 3.0000 mL | RESPIRATORY_TRACT | Status: DC | PRN

## 2024-02-15 MED ORDER — HEPARIN BOLUS VIA INFUSION
4000.0000 [IU] | Freq: Once | INTRAVENOUS | Status: AC
Start: 2024-02-15 — End: 2024-02-15
  Administered 2024-02-15: 4000 [IU] via INTRAVENOUS
  Filled 2024-02-15: qty 4000

## 2024-02-15 NOTE — Discharge Instructions (Addendum)
Information on my medicine - ELIQUIS (apixaban)  Why was Eliquis prescribed for you? Eliquis was prescribed to treat blood clots that may have been found in the veins of your legs (deep vein thrombosis) or in your lungs (pulmonary embolism) and to reduce the risk of them occurring again.  What do You need to know about Eliquis ? The starting dose is 10 mg (two 5 mg tablets) taken TWICE daily for the FIRST SEVEN (7) DAYS, then  the dose is reduced to ONE 5 mg tablet taken TWICE daily.  Eliquis may be taken with or without food.   Try to take the dose about the same time in the morning and in the evening. If you have difficulty swallowing the tablet whole please discuss with your pharmacist how to take the medication safely.  Take Eliquis exactly as prescribed and DO NOT stop taking Eliquis without talking to the doctor who prescribed the medication.  Stopping may increase your risk of developing a new blood clot.  Refill your prescription before you run out.  After discharge, you should have regular check-up appointments with your healthcare provider that is prescribing your Eliquis.    What do you do if you miss a dose? If a dose of ELIQUIS is not taken at the scheduled time, take it as soon as possible on the same day and twice-daily administration should be resumed. The dose should not be doubled to make up for a missed dose.  Important Safety Information A possible side effect of Eliquis is bleeding. You should call your healthcare provider right away if you experience any of the following: Bleeding from an injury or your nose that does not stop. Unusual colored urine (red or dark brown) or unusual colored stools (red or black). Unusual bruising for unknown reasons. A serious fall or if you hit your head (even if there is no bleeding).  Some medicines may interact with Eliquis and might increase your risk of bleeding or clotting while on Eliquis. To help avoid this, consult  your healthcare provider or pharmacist prior to using any new prescription or non-prescription medications, including herbals, vitamins, non-steroidal anti-inflammatory drugs (NSAIDs) and supplements.  This website has more information on Eliquis (apixaban): http://www.eliquis.com/eliquis/home

## 2024-02-15 NOTE — ED Triage Notes (Signed)
 Pt BIB [facility] from home d/t SOB.   Pt increasing SOB over the past 2-3 days. HX CHF. Not taking lasix over the past week following possible discontinuation (?). Took lasix Thursday d/t increasing SOB. Recent hospitalization s/t PNA, dc on 3/23. Has completed ABX. Reports suspected fevers. 3 L [place] PRN. EMS VS 90 HR, RR 26. Given duo neb x1 and 125 soul medrol in route.

## 2024-02-15 NOTE — ED Provider Notes (Signed)
 [facility] Emergency Department Provider Note MRN:  [id number]  Arrival date & time: 02/15/24     Chief Complaint   Shortness of Breath   History of Present Illness   Jerry [name]. is a 83 y.o. year-old male presents to the ED with chief complaint of shortness of breath.  Patient reports worsening shortness of breath over the past 2 to 3 days.  He does have history of CHF.  Had recent admission for sepsis secondary to pneumonia with hypoxic respiratory failure.  He was treated with antibiotics.  After recovering in the hospital, patient was released to home.  He reports that he has begun to worsen at home.  He reports fevers at home.  He states that he recently discontinued his Lasix.  EMS gave a DuoNeb for wheezing.    EMS also gave 125 mg of Solu-Medrol.Jerry [name]  History provided by patient.   Review of Systems  Pertinent positive and negative review of systems noted in HPI.    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, Foley catheter in place MSK/SPINE:  No gross deformities, no edema, moves all extremities  SKIN:  no rash, atraumatic   *Additional and/or pertinent findings included in MDM below  Diagnostic and Interventional Summary    EKG Interpretation Date/Time:    Ventricular Rate:    PR Interval:    QRS Duration:    QT Interval:    QTC Calculation:   R Axis:      Text Interpretation:         Labs Reviewed  CBC - Abnormal; Notable for the following components:       Result Value   RBC 3.86 (*)    Hemoglobin 8.4 (*)    HCT 30.5 (*)    MCV 79.0 (*)    MCH 21.8 (*)    MCHC 27.5 (*)    RDW 22.9 (*)    All other components within normal limits  RESP PANEL BY RT-PCR (RSV, FLU A&B, COVID)  [id number]  BASIC METABOLIC PANEL WITH GFR  BRAIN NATRIURETIC PEPTIDE  TROPONIN I (HIGH SENSITIVITY)  TROPONIN I (HIGH SENSITIVITY)    DG Chest 2 View  Final Result      Medications  furosemide (LASIX) injection 40 mg (has no administration in time range)     Procedures  /  Critical Care Procedures  ED Course and Medical Decision Making  I have reviewed the triage vital signs, the nursing notes, and pertinent available records from the EMR.  Social Determinants Affecting Complexity of Care: Patient has no clinically significant social determinants affecting this chief complaint..   ED Course: Clinical Course as of 02/15/24 0622  Fri Feb 15, 2024  0621 CBC(!) No marked leukocytosis, stable anemia [RB]  0621 Resp panel by RT-PCR (RSV, Flu A&B, Covid) Anterior Nasal Swab COVID and flu negative [RB]    Clinical Course User Index [RB] Roxy Horseman, PA-C    Medical Decision Making Patient here  with shortness of breath.  Recent admission for pneumonia and acute hypoxic respiratory failure.  Patient has completed his antibiotics.  For unknown reason, patient discontinued his Lasix.  He is brought in by EMS.  He was never hypoxic with EMS.  He did have wheezing and rales.  He improved with DuoNeb.  Amount and/or Complexity of Data Reviewed Labs: ordered. Decision-making details documented in ED Course. Radiology: ordered.  Risk Prescription drug management.         Consultants:    Treatment and Plan: Patient signed out to oncoming team at shift change.    Plan: Follow up on labs.  Low threshold for admission, but if labs are reassuring, attempt diuresis in ED followed by ambulation challenge.     Final Clinical Impressions(s) / ED  Diagnoses     ICD-10-CM   1. Shortness of breath  R06.02       ED Discharge Orders     None         Discharge Instructions Discussed with and Provided to Patient:   Discharge Instructions   None      Roxy Horseman, PA-C 02/15/24 [id number]    Shon Baton, MD 02/18/24 [phone]

## 2024-02-15 NOTE — H&P (Addendum)
 History and Physical    Patient: Jerry [name]. VWU:[id number] DOB: July 11, 1941 DOA: 02/15/2024 DOS: the patient was seen and examined on 02/15/2024 PCP: [name], MD  Patient coming from: Home Chief complaint: Chief Complaint  Patient presents with   Shortness of Breath   HPI:  Jerry [name]. is a 83 y.o. 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    HPI confirmed with the patient. At the end of the day wife was at bedside requested a call back and discussed with her the case of patient shortness of breath from bilateral PE and improving pneumonia based on this ED Course: Pt in ed at bedside  is alert, oriented and reconfirms the history.  Vital signs in the ED were notable for the following:  Vitals:   02/15/24 1331 02/15/24 1410 02/15/24 1607 02/15/24 1922  BP: 108/66 (!) 135/90 106/76 113/77  Pulse: 84 86 87 84  Temp: 98.5 F (36.9 C) 97.6 F (36.4 C) 97.6 F (36.4 C) 98.2 F (36.8 C)  Resp: 17 18 19 20   Height:  5\' 11"  (1.803 m)    Weight:  78.1 kg    SpO2: 100% 96% 100% 99%  TempSrc:  Oral Oral Oral  BMI (Calculated):  24.02    >>Labs were notable for the following: BMP showing baseline creatinine of 2.04 EGFR of 32, LFTs added and pending. Troponin of 27, BNP of 48.9. Cbc shows wbc of 8.5 and hb of 8.4 and platelet count of 246. Viral panel negative for flu RSV and COVID. FOBT positive.  >>EKG: Independently reviewed: EKG today shows sinus rhythm 85 PR prolonged  at 2 5 QRS of 82 QTc of 387 significant Q waves in V1 and V2 concerning for an anterior infarct, prominent P waves, no ST-T wave changes otherwise. Anterior infarct noted today was there on EKG of March 2 20th 2025.  >>While in the ED patient received the following: Medications  azithromycin (ZITHROMAX) 500 mg in sodium chloride 0.9 % 250 mL IVPB (has no administration in time range)  furosemide (LASIX) injection 40 mg (40 mg Intravenous Given 02/15/24 0648)  iohexol (OMNIPAQUE) 350 MG/ML injection 60 mL (60 mLs Intravenous Contrast Given 02/15/24 1009)  cefTRIAXone (ROCEPHIN) 1 g in sodium chloride 0.9 % 100 mL IVPB (1 g Intravenous New Bag/Given 02/15/24 1147)   Review of Systems  Respiratory:  Positive for shortness of breath.   Neurological:  Positive for dizziness and weakness.  All other systems reviewed and are negative.  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   Malnutrition of moderate  degree 06/28/2023   Mixed hyperlipidemia 07/21/2021   OSA (obstructive sleep apnea) 07/12/2021   NPSG- 03/16/21- AHI 10.3/ hr, desaturation to 87%, body weight 190 lbs     Renal insufficiency 04/17/2015   Syncope 04/17/2015   Tremor 07/21/2021   Vascular dementia (HCC)    Vitamin D deficiency 07/21/2021   Past Surgical History:  Procedure Laterality Date   BACK SURGERY     BIOPSY  10/09/2023   Procedure: BIOPSY;  Surgeon: Kathi Der, MD;  Location: [facility];  Service: Gastroenterology;;   CHOLECYSTECTOMY      COLONOSCOPY WITH PROPOFOL Bilateral 10/09/2023   Procedure: COLONOSCOPY WITH PROPOFOL;  Surgeon: Kathi Der, MD;  Location: [facility];  Service: Gastroenterology;  Laterality: Bilateral;   ESOPHAGOGASTRODUODENOSCOPY (EGD) WITH PROPOFOL Bilateral 10/09/2023   Procedure: ESOPHAGOGASTRODUODENOSCOPY (EGD) WITH PROPOFOL;  Surgeon: Kathi Der, MD;  Location: [facility];  Service: Gastroenterology;  Laterality: Bilateral;   HOT HEMOSTASIS N/A 10/09/2023   Procedure: HOT HEMOSTASIS (ARGON PLASMA COAGULATION/BICAP);  Surgeon: Kathi Der, MD;  Location: [facility];  Service: Gastroenterology;  Laterality: N/A;   KNEE SURGERY Left    LOWER EXTREMITY VENOGRAPHY  01/29/2024   Procedure: LOWER EXTREMITY VENOGRAPHY;  Surgeon: Swaziland, Peter M, MD;  Location: [facility] INVASIVE CV LAB;  Service: Cardiovascular;;   RIGHT/LEFT HEART CATH AND CORONARY ANGIOGRAPHY N/A 01/29/2024   Procedure: RIGHT/LEFT HEART CATH AND CORONARY ANGIOGRAPHY;  Surgeon: Swaziland, Peter M, MD;  Location: [facility] INVASIVE CV LAB;  Service: Cardiovascular;  Laterality: N/A;   SHOULDER SURGERY     for rotator cuff tear    reports that he has never smoked. He has never used smokeless tobacco. He reports current alcohol use of about 3.0 standard drinks of alcohol per week. He reports current drug use. Drug: Marijuana.  No Known Allergies  Family History  Problem Relation Age of Onset   Diabetes Mother    Diabetes Father     Prior to Admission medications   Medication Sig Start Date End Date Taking? Authorizing Provider  albuterol (VENTOLIN HFA) 108 (90 Base) MCG/ACT inhaler Inhale 2 puffs into the lungs every 6 (six) hours as needed for wheezing or shortness of breath. 07/12/21  Yes Young, Joni Fears D, MD  ALPRAZolam Prudy Feeler) 0.5 MG tablet Take 0.5 mg by mouth 2 (two) times daily as needed for anxiety.   Yes [provider]  aspirin EC 81 MG tablet Take 1 tablet (81 mg total) by mouth daily. Swallow whole. 02/04/24  03/05/24 Yes Lanae Boast, MD  atorvastatin (LIPITOR) 20 MG tablet Take 1 tablet (20 mg total) by mouth daily. 02/04/24 03/05/24 Yes Lanae Boast, MD  Cholecalciferol 1000 UNITS tablet Take 1,000 Units by mouth daily.   Yes [provider]  Cyanocobalamin (VITAMIN B12 PO) Take 1 tablet by mouth daily.   Yes [provider]  DULERA 200-5 MCG/ACT AERO Inhale 2 puffs into the lungs 2 (two) times daily.   Yes [provider]  escitalopram (LEXAPRO) 5 MG tablet Take 1 tablet (5 mg total) by mouth daily. 06/06/23 01/26/25 Yes Earney Navy, NP  FeFum-FePoly-FA-B Cmp-C-Biot (FOLIVANE-PLUS) CAPS Take 1 capsule by mouth daily.   Yes [provider]  furosemide (LASIX) 40 MG tablet Take 1 tablet (40 mg total) by mouth daily. Patient taking differently: Take 20 mg by mouth daily. 02/12/24  Yes Patwardhan, Manish J, MD  ipratropium-albuterol (DUONEB) 0.5-2.5 (3) MG/3ML SOLN Inhale 3 mLs into the lungs every 2 (two) hours as needed (SOB/Wheezing).   Yes [provider]  metoprolol tartrate (LOPRESSOR) 50 MG tablet Take 1 tablet (50 mg total) by mouth 2 (two) times daily. 02/03/24 03/04/24 Yes Lanae Boast, MD  OLANZapine (ZYPREXA) 2.5 MG tablet Take 1 tablet (2.5 mg total) by mouth 2 (two) times daily as needed (agitation). 06/05/23  Yes Linwood Dibbles, MD  predniSONE (DELTASONE) 10 MG tablet Take PO 4 tabs daily x 2 days,3 tabs daily x 2 days,2 tabs daily x 2 days,1 tab daily x 2 days then stop. Patient not taking: Reported on 02/15/2024 02/03/24   Lanae Boast, MD                                                                                   Vitals:   02/15/24 1331 02/15/24 1410 02/15/24 1607 02/15/24 1922  BP: 108/66 (!) 135/90 106/76 113/77  Pulse: 84 86 87 84  Resp: 17 18 19 20   Temp: 98.5 F (36.9 C) 97.6 F (36.4 C) 97.6 F (36.4 C) 98.2 F (36.8 C)  TempSrc:  Oral Oral Oral  SpO2: 100% 96% 100% 99%  Weight:  78.1 kg    Height:  5\' 11"  (1.803 m)     Physical  Exam Vitals and nursing note reviewed.  Constitutional:      General: He is not in acute distress. HENT:     Head: Normocephalic and atraumatic.     Right Ear: Hearing normal.     Left Ear: Hearing normal.     Nose: Nose normal. No nasal deformity.     Mouth/Throat:     Lips: Pink.     Tongue: No lesions.  Eyes:     General: Lids are normal.     Extraocular Movements: Extraocular movements intact.  Cardiovascular:     Rate and Rhythm: Normal rate and regular rhythm.     Heart sounds: Normal heart sounds.  Pulmonary:     Effort: Pulmonary effort is normal.     Breath sounds: Normal breath sounds.  Abdominal:     General: Bowel sounds are normal. There is no distension.     Palpations: Abdomen is soft. There is no mass.     Tenderness: There is no abdominal tenderness.  Musculoskeletal:     Right lower leg: No edema.     Left lower leg: No edema.  Skin:    General: Skin is warm.  Neurological:     General: No focal deficit present.     Mental Status: He is alert and oriented to person, place, and time.     Cranial Nerves: Cranial nerves 2-12 are intact.  Psychiatric:        Attention and Perception: Attention normal.        Mood and Affect: Mood normal.        Speech: Speech normal.        Behavior: Behavior normal. Behavior is cooperative.      Labs on Admission: I have personally reviewed following labs and imaging studies  CBC: Recent Labs  Lab 02/15/24 0436 02/15/24 1701  WBC 8.5  --   HGB 8.4* 8.3*  HCT 30.5* 30.2*  MCV 79.0*  --   PLT 246  --    Basic  Metabolic Panel: Recent Labs  Lab 02/15/24 0436 02/15/24 1701  NA 144  --   K 4.8  --   CL 110  --   CO2 26  --   GLUCOSE 74  --   BUN 42*  --   CREATININE 2.04*  --   CALCIUM 8.3*  --   MG  --  1.6*   GFR: Estimated Creatinine Clearance: 30.8 mL/min (A) (by C-G formula based on SCr of 2.04 mg/dL (H)). Liver Function Tests: Recent Labs  Lab 02/15/24 1701  AST 23  ALT 21  ALKPHOS 44   BILITOT 0.4  PROT 5.5*  ALBUMIN 2.7*   No results for input(s): "LIPASE", "AMYLASE" in the last 168 hours. No results for input(s): "AMMONIA" in the last 168 hours. Coagulation Profile: No results for input(s): "INR", "PROTIME" in the last 168 hours. Cardiac Enzymes: No results for input(s): "CKTOTAL", "CKMB", "CKMBINDEX", "TROPONINI" in the last 168 hours. BNP (last 3 results) Recent Labs    12/27/23 1105  PROBNP 1,952*   HbA1C: No results for input(s): "HGBA1C" in the last 72 hours. CBG: Recent Labs  Lab 02/15/24 1606  GLUCAP 209*   Lipid Profile: No results for input(s): "CHOL", "HDL", "LDLCALC", "TRIG", "CHOLHDL", "LDLDIRECT" in the last 72 hours. Thyroid Function Tests: No results for input(s): "TSH", "T4TOTAL", "FREET4", "T3FREE", "THYROIDAB" in the last 72 hours. Anemia Panel: No results for input(s): "VITAMINB12", "FOLATE", "FERRITIN", "TIBC", "IRON", "RETICCTPCT" in the last 72 hours. Urine analysis:    Component Value Date/Time   COLORURINE YELLOW 06/26/2023 1520   APPEARANCEUR CLOUDY (A) 06/26/2023 1520   LABSPEC 1.009 06/26/2023 1520   PHURINE 7.0 06/26/2023 1520   GLUCOSEU NEGATIVE 06/26/2023 1520   HGBUR SMALL (A) 06/26/2023 1520   BILIRUBINUR NEGATIVE 06/26/2023 1520   KETONESUR NEGATIVE 06/26/2023 1520   PROTEINUR NEGATIVE 06/26/2023 1520   UROBILINOGEN 0.2 04/17/2015 1820   NITRITE NEGATIVE 06/26/2023 1520   LEUKOCYTESUR LARGE (A) 06/26/2023 1520   Radiological Exams on Admission: CT Angio Chest PE W/Cm &/Or Wo Cm Result Date: 02/15/2024 CLINICAL DATA:  Shortness of breath EXAM: CT ANGIOGRAPHY CHEST WITH CONTRAST TECHNIQUE: Multidetector CT imaging of the chest was performed using the standard protocol during bolus administration of intravenous contrast. Multiplanar CT image reconstructions and MIPs were obtained to evaluate the vascular anatomy. RADIATION DOSE REDUCTION: This exam was performed according to the departmental dose-optimization program  which includes automated exposure control, adjustment of the mA and/or kV according to patient size and/or use of iterative reconstruction technique. CONTRAST:  60mL OMNIPAQUE IOHEXOL 350 MG/ML SOLN COMPARISON:  X-ray earlier 02/15/2024. CT without contrast 01/27/2024 FINDINGS: Cardiovascular: There is enlarged heart. Coronary calcifications are seen. Small pericardial effusion. Thoracic aorta is normal course and caliber with partially calcified atherosclerotic plaque. There is a bovine type aortic arch, normal variant. Filling defects seen along branches of the pulmonary artery in the right lower lobe, segmental. Some areas also suggested in the left lower lobe consistent with pulmonary emboli. Mild overall clot burden. No reflux of contrast into the intrahepatic IVC. Mediastinum/Nodes: Preserved thyroid gland. The thoracic esophagus is normal course and caliber. No specific abnormal lymph node enlargement seen in the axillary region. Some small less than 1 cm size mediastinal nodes are seen, not pathologic by size criteria. Slightly larger right hilar node identified measuring 18 by 13 mm on series 7, image 202. of note there is also shift of the mediastinum from right to left with left lung volume loss. Lungs/Pleura: Centrilobular emphysematous changes identified.  Once again there is also lower lobe areas of bronchiectasis with mucous plugging. Parenchymal opacities identified as well along both lower lobes with small amount of along the dependent lingula. These areas are similar to previous. Opacity in the more mid upper left upper lobe have shown improvement. Opacities in the right upper lobe are also overall less in amount but there are some new areas more posteriorly in the right upper lobe on series 8, image 69. 5 mm nodule seen along the course of the minor fissure, stable. Diffuse breathing motion. No pneumothorax. Upper Abdomen: Complex upper pole left-sided renal cyst identified as on prior. Please  correlate prior abdomen pelvis CT. Preserved adrenal glands. Prior cholecystectomy Musculoskeletal: Curvature and degenerative changes seen along the spine. Review of the MIP images confirms the above findings. Critical Value/emergent results were called by telephone at the time of interpretation on 02/15/2024 at 10:55 am to provider Dr. Rubin Payor, who verbally acknowledged these results. IMPRESSION: Bilateral lower lobe segmental pulmonary emboli.  Mild clot burden. Slightly improving opacities in both upper lobes with a subtle new area of mild ground-glass opacity right upper lobe. Areas of opacity in both both lower lobes are similar to the prior CT scan. Volume loss and shift of the mediastinum from right to left. Complex left-sided renal cystic foci. Please correlate with prior abdomen pelvis CT Aortic Atherosclerosis (ICD10-I70.0) and Emphysema (ICD10-J43.9). Electronically Signed   By: Karen Kays M.D.   On: 02/15/2024 10:56   DG Chest 2 View Result Date: 02/15/2024 CLINICAL DATA:  Shortness of breath. EXAM: CHEST - 2 VIEW COMPARISON:  02/03/2024 FINDINGS: Subtle retrocardiac opacity is similar to prior compatible with atelectasis or infiltrate. Tiny left pleural effusion. No dense consolidation in the right lung. No pneumothorax. Cardiopericardial silhouette is at upper limits of normal for size. No acute bony abnormality. Degenerative changes are noted in the right shoulder. IMPRESSION: 1. No substantial change in exam. 2. Subtle retrocardiac opacity compatible with atelectasis or infiltrate. 3. Tiny left pleural effusion. Electronically Signed   By: Kennith Center M.D.   On: 02/15/2024 05:17   Data Reviewed: Relevant notes from primary care and specialist visits, past discharge summaries as available in EHR, including Care Everywhere. Prior diagnostic testing as pertinent to current admission diagnoses, Updated medications and problem lists for reconciliation ED course, including vitals, labs,  imaging, treatment and response to treatment,Triage notes, nursing and pharmacy notes and ED provider's notes Notable results as noted in HPI.Discussed case with EDMD/ ED APP/ or Specialty MD on call and as needed.  >> Shortness of breath/acute hypoxic respiratory failure: SpO2: 99 % O2 Flow Rate (L/min): 3 L/min Attributed to patient's bilateral pulmonary embolism in addition to possible pneumonia. Will obtain a procalcitonin level and consider antibiotic therapy if positive. Continue with supportive care and supplemental oxygen for goal O2 sats of 90%. Continue with as needed bronchodilators as well as scheduled every 8 DuoNebs for the next 24 hours. Continuous pulse oximetry and aspiration precaution.  >>Moderate Pulmonary embolism: Discussed with ED provider to start heparin GGT and also obtain Guaiac.  >>2 D Echo 12/2023: 1. Right ventricular systolic function is mildly reduced. The right ventricular size is normal. There is severely elevated pulmonary artery systolic pressure. The estimated right ventricular systolic pressure is 63. 2 mmHg. 2. Left ventricular ejection fraction, by estimation, is 55 to 60% . Left ventricular ejection fraction by 3D volume is 57 % . The left ventricle has normal function. The left ventricle has no regional wall motion  abnormalities. There is moderate left ventricular hypertrophy. Left ventricular diastolic parameters are indeterminate. There is the interventricular septum is flattened in systole and diastole, consistent with right ventricular pressure and volume overload. 3. The mitral valve is normal in structure. Trivial mitral valve regurgitation. 4. The aortic valve is tricuspid. Aortic valve regurgitation is not visualized. No aortic stenosis is present. 5. The inferior vena cava is normal in size with greater than 50% respiratory variability, suggesting right atrial pressure of 3 mmHg.  >> Anemia/ Guaiac positive stool: Chart review shows patient has been  anemic since June 2016 as far back as I can tell.  Anemia has been intermittent, and microcytic.  Most recent colonoscopy done on November 2024 showed multiple colonic angioectasias with bleeding that were treated with APC internal hemorrhoids, patient also had an upper endoscopy which was abnormal showing multiple small angiectasia's with stigmata of recent bleeding that were treated by APC, diffuse mild inflammation erythema and edema in the entire stomach, small hiatal hernia and a nonobstructing Schatzki ring at the GE junction.  Suspect patient may be intermittently bleeding chronically.  Discussed with ED provider to do stool guaiac.  Will start patient on IV PPI-type and screen, n.p.o. after midnight and monitor H&H every 4 hours.  FOBT is positive and contacted [facility].  See Hb trend below: 04/17/15 17:00 04/17/15 17:05 04/18/15 04:07 06/04/23 16:12 06/26/23 15:46 06/27/23 09:33 06/28/23 01:05 12/22/23 06:20 12/27/23 11:07 01/27/24 16:13 01/27/24 16:33 01/27/24 22:48 01/28/24 01:48 01/28/24 04:32 01/28/24 07:19 01/29/24 04:46 01/29/24 14:59 01/29/24 15:03 01/30/24 02:52 01/31/24 02:50 02/01/24 02:33 02/15/24 04:36 02/15/24 17:01  9.8 (L) 12.2 (L) 9.6 (L) 10.8 (L) 9.1 (L) 8.7 (L) 7.8 (L) 11.0 (L) 11.2 (L) 11.1 (L) 14.3 12.2 (L) 11.9 (L) 10.6 (L) 11.6 (L) 9.6 (L) 10.9 (L) 10.9 (L) 10.9 (L) 8.5 (L) 8.2 (L) 8.3 (L) 8.4 (L) 8.3 (L)  low  >> Pulmonary hypertension: Suspect secondary to pulmonary embolism and d/w wife about osa and oral appliance and to d/w dentist if he is appropriate for same.   >> CKD stage IIIb: Lab Results  Component Value Date   CREATININE 2.04 (H) 02/15/2024   CREATININE 2.14 (H) 02/03/2024   CREATININE 2.03 (H) 02/02/2024  Current creatinine is at 2.04 and is at baseline.  Will continue with low rate IV fluids avoid contrast and monitor as patient has received contrast for angio today.  >> Essential hypertension: Vitals:   02/15/24 0815 02/15/24 0830 02/15/24 1030  02/15/24 1045  BP: 112/68 116/67 116/79 121/74   02/15/24 1100 02/15/24 1115 02/15/24 1130 02/15/24 1145  BP: 112/85 122/61 106/66 115/65   02/15/24 1331 02/15/24 1410 02/15/24 1607 02/15/24 1922  BP: 108/66 (!) 135/90 106/76 113/77  Blood pressure is low Home regimen includes metoprolol 50 mg which we will continue. Patient is also on Lasix 40 mg which I will currently hold and will use on a as needed basis as blood pressures low .   >> Diabetes mellitus type 2: Chart review shows patient carries a diagnosis of diabetes mellitus type 2, no antihyperglycemic agents or insulin per med review, med reconciliation is pending, current BMI is 23.95 it is possible that patient's diabetes is resolved.  Patient's last A1c in February was 6.2.  Will monitor with POCT Premeal and 3 times daily and glycemic protocol.    >>Hypomagnesemia: Replaced and will follow.   DVT prophylaxis:  Heparin gtt.  Consults:  None.  Advance Care Planning:    Code Status: Full Code  Family Communication:  None  Disposition Plan:  Home.  Severity of Illness: The appropriate patient status for this patient is OBSERVATION. Observation status is judged to be reasonable and necessary in order to provide the required intensity of service to ensure the patient's safety. The patient's presenting symptoms, physical exam findings, and initial radiographic and laboratory data in the context of their medical condition is felt to place them at decreased risk for further clinical deterioration. Furthermore, it is anticipated that the patient will be medically stable for discharge from the hospital within 2 midnights of admission.   Author: Gertha Calkin, MD 02/15/2024 7:40 PM For any abnormal results or patient question or concern past 8PM please review AMION call schedule and call the assigned MD for [facility] Floor coverage. For on call review [email or web address].   Unresulted Labs (From admission, onward)     Start     Ordered    02/17/24 0500  CBC  Daily,   R      02/15/24 1904   02/17/24 0500  Heparin level (unfractionated)  Daily,   R      02/15/24 1904   02/16/24 0549  Hemoglobin and hematocrit, blood  Now then every 2 hours,   R (with TIMED occurrences)      02/15/24 1937   02/16/24 0500  Comprehensive metabolic panel  Tomorrow morning,   R        02/15/24 1258   02/16/24 0500  CBC  Tomorrow morning,   R        02/15/24 1258   02/16/24 0500  Magnesium  Daily,   R      02/15/24 1938   02/16/24 0200  Heparin level (unfractionated)  Once-Timed,   TIMED        02/15/24 1904            Orders Placed This Encounter  Procedures   Critical Care   Resp panel by RT-PCR (RSV, Flu A&B, Covid) Anterior Nasal Swab   DG Chest 2 View   CT Angio Chest PE W/Cm &/Or Wo Cm   Basic metabolic panel   CBC   Brain natriuretic peptide   Comprehensive metabolic panel   CBC   Magnesium   Hepatic function panel   Glucose, capillary   Heparin level (unfractionated)   CBC   Heparin level (unfractionated)   Hemoglobin and hematocrit, blood   Magnesium   Diet full liquid Room service appropriate? Yes; Fluid consistency: Thin   Diet NPO time specified   Document Height and Actual Weight   Apply Diabetes Mellitus Care Plan   STAT CBG when hypoglycemia is suspected. If treated, recheck every 15 minutes after each treatment until CBG >/= 70 mg/dl   Refer to Hypoglycemia Protocol Sidebar Report for treatment of CBG < 70 mg/dl   No HS correction Insulin   Maintain IV access   Vital signs   Notify physician (specify)   Mobility Protocol: No Restrictions RN to initiate protocols based on patient's level of care   Refer to Sidebar Report Refer to ICU, Med-Surg, Progressive, and Step-Down Mobility Protocol Sidebars   Initiate Adult Central Line Maintenance and Catheter Protocol for patients with central line (CVC, PICC, Port, Hemodialysis, Trialysis)   Daily weights   Intake and Output   Do not place and if present remove  PureWick   Initiate Oral Care Protocol   Initiate Carrier Fluid Protocol   RN may order General Admission PRN Orders utilizing "General Admission PRN medications" (through manage  orders) for the following patient needs: allergy symptoms (Claritin), cold sores (Carmex), cough (Robitussin DM), eye irritation (Liquifilm Tears), hemorrhoids (Tucks), indigestion (Maalox), minor skin irritation (Hydrocortisone Cream), muscle pain Romeo Apple Gay), nose irritation (saline nasal spray) and sore throat (Chloraseptic spray).   Cardiac Monitoring - Continuous Indefinite   Care order/instruction: While pt is npo PLEASE CHECK ACCUCHEK Q 2 HOURLY AND PLACE NEEDED ORDERS THANKS   Full code   Consult to hospitalist  Worsening SOB, PNA, BL subsegmental PEs   heparin per pharmacy consult   Consult to gastroenterology Consult Timeframe: ROUTINE - requires response within 24 hours; Reason for Consult? GIB, ANEMIA , PE.   Pulse oximetry check with vital signs   Oxygen therapy Mode or (Route): Nasal cannula; Liters Per Minute: 2; Keep O2 saturation between: greater than 92 %   POC occult blood, ED Provider will collect   EKG 12-Lead   ED EKG   EKG 12-Lead   Saline lock IV   Place in observation (patient's expected length of stay will be less than 2 midnights)   Aspiration precautions   Fall precautions

## 2024-02-15 NOTE — Progress Notes (Signed)
 PHARMACY - ANTICOAGULATION CONSULT NOTE  Pharmacy Consult for Heparin Indication: pulmonary embolus  No Known Allergies  Patient Measurements: Height: 5\' 11"  (180.3 cm) Weight: 78.1 kg (172 lb 2.9 oz) IBW/kg (Calculated) : 75.3 HEPARIN DW (KG): 78.1  Vital Signs: Temp: 97.6 F (36.4 C) (04/04 1607) Temp Source: Oral (04/04 1607) BP: 106/76 (04/04 1607) Pulse Rate: 87 (04/04 1607)  Labs: Recent Labs    02/15/24 0436 02/15/24 0619 02/15/24 1701  HGB 8.4*  --  8.3*  HCT 30.5*  --  30.2*  PLT 246  --   --   CREATININE 2.04*  --   --   TROPONINIHS 27* 22*  --     Estimated Creatinine Clearance: 30.8 mL/min (A) (by C-G formula based on SCr of 2.04 mg/dL (H)).   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   Malnutrition of moderate degree 06/28/2023   Mixed hyperlipidemia 07/21/2021   OSA (obstructive sleep apnea) 07/12/2021   NPSG- 03/16/21- AHI 10.3/ hr, desaturation to 87%, body weight 190 lbs     Renal insufficiency 04/17/2015   Syncope 04/17/2015   Tremor 07/21/2021   Vascular dementia (HCC)    Vitamin D deficiency 07/21/2021   Assessment: 83 year old male with PE x 2 to begin heparin  Goal of Therapy:  Heparin level 0.3-0.7 units/ml Monitor platelets by anticoagulation protocol: Yes   Plan:  Heparin 4000 units iv bolus x 1 Heparin drip at 1250 units / hr Heparin level in 8 hours Daily heparin level, CBC  Thank you. Okey Regal, PharmD  Elwin Sleight 02/15/2024,7:00  PM

## 2024-02-15 NOTE — ED Provider Notes (Signed)
   Accepted handoff at shift change from [facility]. Please see prior provider note for more detail.   Briefly: Patient is 83 y.o. "chief complaint of shortness of breath.  Patient reports worsening shortness of breath over the past 2 to 3 days.  He does have history of CHF.  Had recent admission for sepsis secondary to pneumonia with hypoxic respiratory failure.  He was treated with antibiotics.  After recovering in the hospital, patient was released to home.  He reports that he has begun to worsen at home.  He reports fevers at home.  He states that he recently discontinued his Lasix.  EMS gave a DuoNeb for wheezing.    EMS also gave 125 mg of Solu-Medrol."  DDX: concern for Anaphylaxis/Angioedema, Aspirated FB, Arrhythmia, CHF, Asthma, COPD, PNA, COVID/Flu/RSV, STEMI, Tamponade, TPNX, Sepsis, PE  Plan:  - Admitting patient for SOB, PNA and BL LL subsegmental PE. - PMHx anemia, CKD, asthma, HTN, HLD, recent hospitalization for PNA and acute hypoxic respiratory failure - now on 2-3L [place] at baseline.  - Patient presents to ED concerned for worsening SOB x2-3 day. Patient felt better for a short time yesterday, but then had a severe coughing fit and SOB around 12AM this morning that did not resolve with breathing treatment, so wife called EMS. EMS gave solumedrol and Duoneb. - physical exam with diffuse wheezing and rales. Vitals stable. Patient exhausted and not able to ambulate in ED d/t no sleep last night. - Respiratory panel negative.  BNP within normal limits.  CBC without leukocytosis.  There is anemia with hemoglobin 8.4 near patient's baseline.  Initial troponin 27 downtrending to 22.  BMP with BUN/Cr near patient's baseline - currently 42/2.04. - CTA chest showing new RUL opacity and subsegmental PE's. - patient provided with Lasix, Azithromycin, and Rocephin in ED.  - given patient's recent hospital admission and quickly worsening symptoms. I believe that it would be in his best interest  for further inpatient treatment. Given more mild appearance of PE's will discuss with hospitalist to decide weather to start patient on DOAC's vs heparin. - Dr. Allena Katz admitting provider. They requested POC Occult Stool which I will obtain now before starting Heparin.    .Critical Care  Performed by: Dorthy Cooler, PA-C Authorized by: Dorthy Cooler, PA-C   Critical care provider statement:    Critical care time (minutes):  30   Critical care was time spent personally by me on the following activities:  Development of treatment plan with patient or surrogate, discussions with consultants, evaluation of patient's response to treatment, examination of patient, ordering and review of laboratory studies, ordering and review of radiographic studies, ordering and performing treatments and interventions, pulse oximetry, re-evaluation of patient's condition and review of old charts   Care discussed with: admitting provider   Comments:     PE and PNA requiring hospital admission     Margarita Rana 02/15/24 1228    Shon Baton, MD 02/18/24 [phone]

## 2024-02-16 DIAGNOSIS — I252 Old myocardial infarction: Secondary | ICD-10-CM | POA: Diagnosis not present

## 2024-02-16 DIAGNOSIS — M7989 Other specified soft tissue disorders: Secondary | ICD-10-CM | POA: Diagnosis not present

## 2024-02-16 DIAGNOSIS — D509 Iron deficiency anemia, unspecified: Secondary | ICD-10-CM | POA: Diagnosis present

## 2024-02-16 DIAGNOSIS — I2699 Other pulmonary embolism without acute cor pulmonale: Secondary | ICD-10-CM | POA: Diagnosis present

## 2024-02-16 DIAGNOSIS — J4520 Mild intermittent asthma, uncomplicated: Secondary | ICD-10-CM | POA: Diagnosis present

## 2024-02-16 DIAGNOSIS — Z79899 Other long term (current) drug therapy: Secondary | ICD-10-CM | POA: Diagnosis not present

## 2024-02-16 DIAGNOSIS — I10 Essential (primary) hypertension: Secondary | ICD-10-CM

## 2024-02-16 DIAGNOSIS — E1122 Type 2 diabetes mellitus with diabetic chronic kidney disease: Secondary | ICD-10-CM | POA: Diagnosis present

## 2024-02-16 DIAGNOSIS — I2694 Multiple subsegmental pulmonary emboli without acute cor pulmonale: Secondary | ICD-10-CM | POA: Diagnosis present

## 2024-02-16 DIAGNOSIS — F0150 Vascular dementia without behavioral disturbance: Secondary | ICD-10-CM | POA: Diagnosis present

## 2024-02-16 DIAGNOSIS — E875 Hyperkalemia: Secondary | ICD-10-CM

## 2024-02-16 DIAGNOSIS — Z1152 Encounter for screening for COVID-19: Secondary | ICD-10-CM | POA: Diagnosis not present

## 2024-02-16 DIAGNOSIS — N1832 Chronic kidney disease, stage 3b: Secondary | ICD-10-CM | POA: Diagnosis present

## 2024-02-16 DIAGNOSIS — Z7951 Long term (current) use of inhaled steroids: Secondary | ICD-10-CM | POA: Diagnosis not present

## 2024-02-16 DIAGNOSIS — Z8546 Personal history of malignant neoplasm of prostate: Secondary | ICD-10-CM | POA: Diagnosis not present

## 2024-02-16 DIAGNOSIS — J9621 Acute and chronic respiratory failure with hypoxia: Secondary | ICD-10-CM | POA: Diagnosis present

## 2024-02-16 DIAGNOSIS — I82432 Acute embolism and thrombosis of left popliteal vein: Secondary | ICD-10-CM | POA: Diagnosis present

## 2024-02-16 DIAGNOSIS — I272 Pulmonary hypertension, unspecified: Secondary | ICD-10-CM | POA: Diagnosis present

## 2024-02-16 DIAGNOSIS — E782 Mixed hyperlipidemia: Secondary | ICD-10-CM | POA: Diagnosis present

## 2024-02-16 DIAGNOSIS — J189 Pneumonia, unspecified organism: Secondary | ICD-10-CM | POA: Diagnosis present

## 2024-02-16 DIAGNOSIS — Z833 Family history of diabetes mellitus: Secondary | ICD-10-CM | POA: Diagnosis not present

## 2024-02-16 DIAGNOSIS — Z7901 Long term (current) use of anticoagulants: Secondary | ICD-10-CM | POA: Diagnosis not present

## 2024-02-16 DIAGNOSIS — Z8551 Personal history of malignant neoplasm of bladder: Secondary | ICD-10-CM | POA: Diagnosis not present

## 2024-02-16 DIAGNOSIS — Z7982 Long term (current) use of aspirin: Secondary | ICD-10-CM | POA: Diagnosis not present

## 2024-02-16 DIAGNOSIS — G4733 Obstructive sleep apnea (adult) (pediatric): Secondary | ICD-10-CM | POA: Diagnosis present

## 2024-02-16 DIAGNOSIS — R0602 Shortness of breath: Secondary | ICD-10-CM | POA: Diagnosis present

## 2024-02-16 DIAGNOSIS — I130 Hypertensive heart and chronic kidney disease with heart failure and stage 1 through stage 4 chronic kidney disease, or unspecified chronic kidney disease: Secondary | ICD-10-CM | POA: Diagnosis present

## 2024-02-16 LAB — GLUCOSE, CAPILLARY
Glucose-Capillary: 114 mg/dL — ABNORMAL HIGH (ref 70–99)
Glucose-Capillary: 96 mg/dL (ref 70–99)
Glucose-Capillary: 94 mg/dL (ref 70–99)
Glucose-Capillary: 103 mg/dL — ABNORMAL HIGH (ref 70–99)
Glucose-Capillary: 106 mg/dL — ABNORMAL HIGH (ref 70–99)

## 2024-02-16 LAB — CBC
HCT: 28.6 % — ABNORMAL LOW (ref 39.0–52.0)
Hemoglobin: 7.9 g/dL — ABNORMAL LOW (ref 13.0–17.0)
MCH: 21.5 pg — ABNORMAL LOW (ref 26.0–34.0)
MCHC: 27.6 g/dL — ABNORMAL LOW (ref 30.0–36.0)
MCV: 77.9 fL — ABNORMAL LOW (ref 80.0–100.0)
Platelets: 215 10*3/uL (ref 150–400)
RBC: 3.67 MIL/uL — ABNORMAL LOW (ref 4.22–5.81)
RDW: 22.4 % — ABNORMAL HIGH (ref 11.5–15.5)
WBC: 7 10*3/uL (ref 4.0–10.5)
nRBC: 0 % (ref 0.0–0.2)

## 2024-02-16 LAB — HEMOGLOBIN AND HEMATOCRIT, BLOOD
HCT: 28.5 % — ABNORMAL LOW (ref 39.0–52.0)
HCT: 31.6 % — ABNORMAL LOW (ref 39.0–52.0)
Hemoglobin: 8.1 g/dL — ABNORMAL LOW (ref 13.0–17.0)
Hemoglobin: 8.6 g/dL — ABNORMAL LOW (ref 13.0–17.0)

## 2024-02-16 LAB — HEPARIN LEVEL (UNFRACTIONATED)
Heparin Unfractionated: 0.74 [IU]/mL — ABNORMAL HIGH (ref 0.30–0.70)
Heparin Unfractionated: 0.77 [IU]/mL — ABNORMAL HIGH (ref 0.30–0.70)
Heparin Unfractionated: 0.94 [IU]/mL — ABNORMAL HIGH (ref 0.30–0.70)

## 2024-02-16 LAB — MAGNESIUM: Magnesium: 1.6 mg/dL — ABNORMAL LOW (ref 1.7–2.4)

## 2024-02-16 LAB — COMPREHENSIVE METABOLIC PANEL WITH GFR
ALT: 19 U/L (ref 0–44)
AST: 16 U/L (ref 15–41)
Albumin: 2.6 g/dL — ABNORMAL LOW (ref 3.5–5.0)
Alkaline Phosphatase: 43 U/L (ref 38–126)
Anion gap: 6 (ref 5–15)
BUN: 39 mg/dL — ABNORMAL HIGH (ref 8–23)
CO2: 27 mmol/L (ref 22–32)
Calcium: 8.8 mg/dL — ABNORMAL LOW (ref 8.9–10.3)
Chloride: 106 mmol/L (ref 98–111)
Creatinine, Ser: 1.85 mg/dL — ABNORMAL HIGH (ref 0.61–1.24)
GFR, Estimated: 36 mL/min — ABNORMAL LOW (ref >60)
Glucose, Bld: 119 mg/dL — ABNORMAL HIGH (ref 70–99)
Potassium: 5.6 mmol/L — ABNORMAL HIGH (ref 3.5–5.1)
Sodium: 139 mmol/L (ref 135–145)
Total Bilirubin: 0.4 mg/dL (ref 0.0–1.2)
Total Protein: 5.2 g/dL — ABNORMAL LOW (ref 6.5–8.1)

## 2024-02-16 MED ORDER — SODIUM ZIRCONIUM CYCLOSILICATE 10 G PO PACK
10.0000 g | PACK | Freq: Once | ORAL | Status: AC
  Administered 2024-02-16: 10 g via ORAL
  Filled 2024-02-16: qty 1

## 2024-02-16 MED ORDER — SODIUM CHLORIDE 0.9 % IV SOLN
100.0000 mg | Freq: Once | INTRAVENOUS | Status: AC
  Administered 2024-02-16: 100 mg via INTRAVENOUS
  Filled 2024-02-16: qty 5

## 2024-02-16 MED ORDER — MAGNESIUM SULFATE 2 GM/50ML IV SOLN
2.0000 g | Freq: Once | INTRAVENOUS | Status: AC
  Administered 2024-02-16: 2 g via INTRAVENOUS
  Filled 2024-02-16 (×2): qty 50

## 2024-02-16 NOTE — Progress Notes (Addendum)
 [facility] PROGRESS NOTE   Jerry [name]. XBJ:[id number] DOB: 1941/02/21 DOA: 02/15/2024  PCP: [name], MD  Brief History: 83 y.o. 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    Evaluation during this hospitalization revealed pulmonary embolism on CT scan.  Patient was hospitalized for further management.    Consultants: [facility]  Procedures: None yet    Subjective/Interval History: Patient denies any chest pain shortness of breath nausea vomiting abdominal pain.    Assessment/Plan:  Acute pulmonary embolism/acute respiratory failure with hypoxia No recent travel but the patient was hospitalized in March for pneumonia. Patient was started on IV heparin.  Hemodynamically stable. Saturations were noted to be 100% on 2 L per nasal cannula.  Should be able to wean off of O2. Echocardiogram in February 2025 showed RV function to be mildly reduced.  LVEF of 55 to 60%.  Do not see any clear indication to repeating the echo. We will proceed with lower extremity Doppler studies.  Microcytic anemia Patient with longstanding history of anemia.  Followed by [facility]ology.  Underwent colonoscopy in November 2024 which showed multiple colonic angioectasias she is with bleeding that were treated with APC.  Noted to have internal hemorrhoids. Will be at high risk of bleeding due to need for anticoagulation. FOBT was positive. Gastroenterology has been consulted.   Continue to monitor closely while patient is on anticoagulation.  Pulmonary hypertension Now with PE.  See above.  Chronic kidney disease stage IIIb/hyperkalemia hypomagnesemia Renal function close to baseline. Lokelma will be ordered for elevated potassium level. Supplement magnesium.  Essential hypertension Monitor blood pressures closely. Patient noted to be on metoprolol.  Diabetes mellitus type 2 Monitor CBGs.  HbA1c was 6.2 in February.   DVT Prophylaxis: On IV heparin Code Status: Full code Family Communication: Will update his wife Disposition Plan: Hopefully return home when improved  Status is: Observation The patient will require care spanning > 2 midnights and should be moved to inpatient because: Acute PE      Medications: Scheduled:  atorvastatin  20 mg Oral Daily   escitalopram  5 mg Oral Daily   insulin aspart  0-6 Units Subcutaneous TID WC   metoprolol tartrate  50 mg Oral BID   mometasone-formoterol  2 puff Inhalation BID   pantoprazole (PROTONIX) IV  40 mg Intravenous Q12H   sodium chloride flush  3 mL Intravenous Q12H   Continuous:  azithromycin     cefTRIAXone (ROCEPHIN)  IV 1 g (02/16/24 0945)   heparin 1,250 Units/hr (02/15/24 2201)   magnesium sulfate bolus IVPB     HYQ:MVHQIONGEXBMW **OR** acetaminophen, albuterol, HYDROcodone-acetaminophen, ipratropium-albuterol, morphine injection, OLANZapine  Antibiotics: Anti-infectives (From admission, onward)    Start     Dose/Rate Route Frequency Ordered Stop   02/16/24 1300  azithromycin (ZITHROMAX) 500 mg in sodium chloride 0.9 % 250 mL IVPB        500 mg 250 mL/hr over 60 Minutes Intravenous Every 24 hours 02/15/24 1847     02/16/24 1000  cefTRIAXone (ROCEPHIN) 1 g in sodium chloride 0.9 % 100  mL IVPB        1 g 200 mL/hr over 30 Minutes Intravenous Every 24 hours 02/15/24 1847     02/15/24 1115  cefTRIAXone (ROCEPHIN) 1 g in sodium chloride 0.9 % 100 mL IVPB        1 g 200 mL/hr over 30  Minutes Intravenous  Once 02/15/24 1114 02/15/24 1221   02/15/24 1115  azithromycin (ZITHROMAX) 500 mg in sodium chloride 0.9 % 250 mL IVPB        500 mg 250 mL/hr over 60 Minutes Intravenous  Once 02/15/24 1114 02/15/24 1325       Objective:  Vital Signs  Vitals:   02/16/24 0500 02/16/24 0529 02/16/24 0801 02/16/24 0931  BP:  136/82 137/88   Pulse:  88 80   Resp:  (!) 22 20   Temp:  98.2 F (36.8 C) (!) 97.4 F (36.3 C)   TempSrc:  Oral Oral   SpO2:  96% 100% 100%  Weight: 77.9 kg     Height:        Intake/Output Summary (Last 24 hours) at 02/16/2024 1005 Last data filed at 02/16/2024 0959 Gross per 24 hour  Intake 1214.96 ml  Output 3900 ml  Net -2685.04 ml   Filed Weights   02/15/24 1410 02/16/24 0500  Weight: 78.1 kg 77.9 kg    General appearance: Awake alert.  In no distress Resp: Clear to auscultation bilaterally.  Normal effort Cardio: S1-S2 is normal regular.  No S3-S4.  No rubs murmurs or bruit GI: Abdomen is soft.  Nontender nondistended.  Bowel sounds are present normal.  No masses organomegaly.  Urostomy is noted. Extremities: No edema.  Full range of motion of lower extremities. Neurologic: Alert and oriented x3.  No focal neurological deficits.    Lab Results:  Data Reviewed: I have personally reviewed following labs and reports of the imaging studies  CBC: Recent Labs  Lab 02/15/24 0436 02/15/24 1701 02/16/24 0250  WBC 8.5  --  7.0  HGB 8.4* 8.3* 7.9*  HCT 30.5* 30.2* 28.6*  MCV 79.0*  --  77.9*  PLT 246  --  215    Basic Metabolic Panel: Recent Labs  Lab 02/15/24 0436 02/15/24 1701 02/16/24 0250  NA 144  --  139  K 4.8  --  5.6*  CL 110  --  106  CO2 26  --  27  GLUCOSE 74  --  119*  BUN 42*  --  39*  CREATININE 2.04*  --  1.85*  CALCIUM 8.3*  --  8.8*  MG  --  1.6* 1.6*    GFR: Estimated Creatinine Clearance: 33.9 mL/min (A) (by C-G formula based on SCr of 1.85 mg/dL (H)).  Liver Function Tests: Recent Labs  Lab  02/15/24 1701 02/16/24 0250  AST 23 16  ALT 21 19  ALKPHOS 44 43  BILITOT 0.4 0.4  PROT 5.5* 5.2*  ALBUMIN 2.7* 2.6*    CBG: Recent Labs  Lab 02/15/24 1606 02/16/24 0559 02/16/24 0811  GLUCAP 209* 114* 96     Recent Results (from the past 240 hours)  Resp panel by RT-PCR (RSV, Flu A&B, Covid) Anterior Nasal Swab     Status: None   Collection Time: 02/15/24  4:43 AM   Specimen: Anterior Nasal Swab  Result Value Ref Range Status   SARS Coronavirus 2 by RT PCR NEGATIVE NEGATIVE Final   Influenza A by PCR NEGATIVE NEGATIVE Final   Influenza B by PCR NEGATIVE NEGATIVE Final  Comment: (NOTE) The Xpert Xpress SARS-CoV-2/FLU/RSV plus assay is intended as an aid in the diagnosis of influenza from Nasopharyngeal swab specimens and should not be used as a sole basis for treatment. Nasal washings and aspirates are unacceptable for Xpert Xpress SARS-CoV-2/FLU/RSV testing.  Fact Sheet for Patients: BloggerCourse.com  Fact Sheet for Healthcare Providers: SeriousBroker.it  This test is not yet approved or cleared by the [place] FDA and has been authorized for detection and/or diagnosis of SARS-CoV-2 by FDA under an Emergency Use Authorization (EUA). This EUA will remain in effect (meaning this test can be used) for the duration of the COVID-19 declaration under Section 564(b)(1) of the Act, 21 U.S.C. section 360bbb-3(b)(1), unless the authorization is terminated or revoked.     Resp Syncytial Virus by PCR NEGATIVE NEGATIVE Final    Comment: (NOTE) Fact Sheet for Patients: BloggerCourse.com  Fact Sheet for Healthcare Providers: SeriousBroker.it  This test is not yet approved or cleared by the [place] FDA and has been authorized for detection and/or diagnosis of SARS-CoV-2 by FDA under an Emergency Use Authorization (EUA). This EUA will remain in effect (meaning  this test can be used) for the duration of the COVID-19 declaration under Section 564(b)(1) of the Act, 21 U.S.C. section 360bbb-3(b)(1), unless the authorization is terminated or revoked.  Performed at [facility], [address] [place], [place] [address]       Radiology Studies: CT Angio Chest PE W/Cm &/Or Wo Cm Result Date: 02/15/2024 CLINICAL DATA:  Shortness of breath EXAM: CT ANGIOGRAPHY CHEST WITH CONTRAST TECHNIQUE: Multidetector CT imaging of the chest was performed using the standard protocol during bolus administration of intravenous contrast. Multiplanar CT image reconstructions and MIPs were obtained to evaluate the vascular anatomy. RADIATION DOSE REDUCTION: This exam was performed according to the departmental dose-optimization program which includes automated exposure control, adjustment of the mA and/or kV according to patient size and/or use of iterative reconstruction technique. CONTRAST:  60mL OMNIPAQUE IOHEXOL 350 MG/ML SOLN COMPARISON:  X-ray earlier 02/15/2024. CT without contrast 01/27/2024 FINDINGS: Cardiovascular: There is enlarged heart. Coronary calcifications are seen. Small pericardial effusion. Thoracic aorta is normal course and caliber with partially calcified atherosclerotic plaque. There is a bovine type aortic arch, normal variant. Filling defects seen along branches of the pulmonary artery in the right lower lobe, segmental. Some areas also suggested in the left lower lobe consistent with pulmonary emboli. Mild overall clot burden. No reflux of contrast into the intrahepatic IVC. Mediastinum/Nodes: Preserved thyroid gland. The thoracic esophagus is normal course and caliber. No specific abnormal lymph node enlargement seen in the axillary region. Some small less than 1 cm size mediastinal nodes are seen, not pathologic by size criteria. Slightly larger right hilar node identified measuring 18 by 13 mm on series 7, image 202. of note there is also shift of the  mediastinum from right to left with left lung volume loss. Lungs/Pleura: Centrilobular emphysematous changes identified. Once again there is also lower lobe areas of bronchiectasis with mucous plugging. Parenchymal opacities identified as well along both lower lobes with small amount of along the dependent lingula. These areas are similar to previous. Opacity in the more mid upper left upper lobe have shown improvement. Opacities in the right upper lobe are also overall less in amount but there are some new areas more posteriorly in the right upper lobe on series 8, image 69. 5 mm nodule seen along the course of the minor fissure, stable. Diffuse breathing motion. No pneumothorax. Upper  Abdomen: Complex upper pole left-sided renal cyst identified as on prior. Please correlate prior abdomen pelvis CT. Preserved adrenal glands. Prior cholecystectomy Musculoskeletal: Curvature and degenerative changes seen along the spine. Review of the MIP images confirms the above findings. Critical Value/emergent results were called by telephone at the time of interpretation on 02/15/2024 at 10:55 am to provider Dr. Rubin Payor, who verbally acknowledged these results. IMPRESSION: Bilateral lower lobe segmental pulmonary emboli.  Mild clot burden. Slightly improving opacities in both upper lobes with a subtle new area of mild ground-glass opacity right upper lobe. Areas of opacity in both both lower lobes are similar to the prior CT scan. Volume loss and shift of the mediastinum from right to left. Complex left-sided renal cystic foci. Please correlate with prior abdomen pelvis CT Aortic Atherosclerosis (ICD10-I70.0) and Emphysema (ICD10-J43.9). Electronically Signed   By: Karen Kays M.D.   On: 02/15/2024 10:56   DG Chest 2 View Result Date: 02/15/2024 CLINICAL DATA:  Shortness of breath. EXAM: CHEST - 2 VIEW COMPARISON:  02/03/2024 FINDINGS: Subtle retrocardiac opacity is similar to prior compatible with atelectasis or infiltrate.  Tiny left pleural effusion. No dense consolidation in the right lung. No pneumothorax. Cardiopericardial silhouette is at upper limits of normal for size. No acute bony abnormality. Degenerative changes are noted in the right shoulder. IMPRESSION: 1. No substantial change in exam. 2. Subtle retrocardiac opacity compatible with atelectasis or infiltrate. 3. Tiny left pleural effusion. Electronically Signed   By: Kennith Center M.D.   On: 02/15/2024 05:17       LOS: 0 days   Jerry [name]  [facility] Pager on [email or web address]  02/16/2024, 10:05 AM

## 2024-02-16 NOTE — Plan of Care (Signed)
   Problem: Education: Goal: Ability to describe self-care measures that may prevent or decrease complications (Diabetes Survival Skills Education) will improve Outcome: Progressing

## 2024-02-16 NOTE — Plan of Care (Signed)
 Pt weight continuing to go down

## 2024-02-16 NOTE — Progress Notes (Signed)
 PHARMACY - ANTICOAGULATION  Pharmacy Consult for Heparin Indication: pulmonary embolus Brief A/P: Heparin level slightly supratherapeutic Continue Heparin at current rate for now No Known Allergies  Patient Measurements: Height: 5\' 11"  (180.3 cm) Weight: 78.1 kg (172 lb 2.9 oz) IBW/kg (Calculated) : 75.3 HEPARIN DW (KG): 78.1  Vital Signs: Temp: 97.9 F (36.6 C) (04/04 2351) Temp Source: Oral (04/04 2351) BP: 116/79 (04/04 2351) Pulse Rate: 87 (04/04 2351)  Labs: Recent Labs    02/15/24 0436 02/15/24 0619 02/15/24 1701 02/16/24 0250  HGB 8.4*  --  8.3* 7.9*  HCT 30.5*  --  30.2* 28.6*  PLT 246  --   --  215  HEPARINUNFRC  --   --   --  0.74*  CREATININE 2.04*  --   --  1.85*  TROPONINIHS 27* 22*  --   --     Estimated Creatinine Clearance: 33.9 mL/min (A) (by C-G formula based on SCr of 1.85 mg/dL (H)).  Assessment: 83 year old male with PE for heparin  Goal of Therapy:  Heparin level 0.3-0.7 units/ml Monitor platelets by anticoagulation protocol: Yes   Plan:  No change to heparin for now Check heparin level in 6 hours and adjust rate if needed at that time  Eddie Candle 02/16/2024,4:03 AM

## 2024-02-16 NOTE — Consult Note (Signed)
 [facility] Consultation Note  Referring Provider: [facility] Primary Care Physician:  [name], MD Primary Gastroenterologist:  Dr. Levora Angel  Reason for Consultation:  anemia  HPI: Jerry [name]. is a 83 y.o. male whom we've been asked to see for interval anemia, and hemoccult positive stools.  Recent hospitalization for pneumonia and recurrent admission for recurrent shortness of breath and newly diagnosed pulmonary emboli.  Patient has no overt blood in stool.  No abdominal pain.  No hematemesis.   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   Malnutrition of moderate degree 06/28/2023   Mixed hyperlipidemia 07/21/2021   OSA (obstructive sleep apnea) 07/12/2021   NPSG- 03/16/21- AHI 10.3/ hr, desaturation to 87%, body weight 190 lbs     Renal insufficiency 04/17/2015   Syncope 04/17/2015   Tremor 07/21/2021   Vascular dementia (HCC)    Vitamin D deficiency 07/21/2021    Past Surgical History:  Procedure Laterality Date   BACK SURGERY     BIOPSY  10/09/2023   Procedure: BIOPSY;  Surgeon: Kathi Der, MD;  Location: [facility];  Service: Gastroenterology;;   CHOLECYSTECTOMY     COLONOSCOPY WITH PROPOFOL Bilateral 10/09/2023   Procedure: COLONOSCOPY WITH PROPOFOL;  Surgeon: Kathi Der, MD;  Location: [facility];  Service: Gastroenterology;  Laterality: Bilateral;   ESOPHAGOGASTRODUODENOSCOPY (EGD) WITH PROPOFOL Bilateral 10/09/2023   Procedure:  ESOPHAGOGASTRODUODENOSCOPY (EGD) WITH PROPOFOL;  Surgeon: Kathi Der, MD;  Location: [facility];  Service: Gastroenterology;  Laterality: Bilateral;   HOT HEMOSTASIS N/A 10/09/2023   Procedure: HOT HEMOSTASIS (ARGON PLASMA COAGULATION/BICAP);  Surgeon: Kathi Der, MD;  Location: [facility];  Service: Gastroenterology;  Laterality: N/A;   KNEE SURGERY Left    LOWER EXTREMITY VENOGRAPHY  01/29/2024   Procedure: LOWER EXTREMITY VENOGRAPHY;  Surgeon: Swaziland, Peter M, MD;  Location: [facility] INVASIVE CV LAB;  Service: Cardiovascular;;   RIGHT/LEFT HEART CATH AND CORONARY ANGIOGRAPHY N/A 01/29/2024   Procedure: RIGHT/LEFT HEART CATH AND CORONARY ANGIOGRAPHY;  Surgeon: Swaziland, Peter M, MD;  Location: [facility] INVASIVE CV LAB;  Service: Cardiovascular;  Laterality: N/A;   SHOULDER SURGERY     for rotator cuff tear    Prior to Admission medications   Medication Sig Start Date End Date Taking? Authorizing Provider  albuterol (VENTOLIN HFA) 108 (90 Base) MCG/ACT inhaler Inhale 2 puffs into the lungs every 6 (six) hours as needed for wheezing or shortness of breath. 07/12/21  Yes Young, Joni Fears D, MD  ALPRAZolam Prudy Feeler) 0.5 MG tablet Take 0.5 mg by mouth 2 (two) times daily as needed for anxiety.   Yes [provider]  aspirin EC 81 MG tablet Take 1 tablet (81 mg total) by mouth daily. Swallow whole. 02/04/24 03/05/24 Yes Lanae Boast, MD  atorvastatin (LIPITOR) 20 MG tablet Take 1 tablet (20 mg total) by mouth daily. 02/04/24 03/05/24 Yes Lanae Boast, MD  Cholecalciferol 1000 UNITS tablet Take 1,000 Units by mouth daily.   Yes [provider]  Cyanocobalamin (VITAMIN B12 PO) Take 1 tablet by mouth  daily.   Yes [provider]  DULERA 200-5 MCG/ACT AERO Inhale 2 puffs into the lungs 2 (two) times daily.   Yes [provider]  escitalopram (LEXAPRO) 5 MG tablet Take 1 tablet (5 mg total) by mouth daily. 06/06/23 01/26/25 Yes Earney Navy, NP  FeFum-FePoly-FA-B Cmp-C-Biot  (FOLIVANE-PLUS) CAPS Take 1 capsule by mouth daily.   Yes [provider]  furosemide (LASIX) 40 MG tablet Take 1 tablet (40 mg total) by mouth daily. Patient taking differently: Take 20 mg by mouth daily. 02/12/24  Yes Patwardhan, Manish J, MD  ipratropium-albuterol (DUONEB) 0.5-2.5 (3) MG/3ML SOLN Inhale 3 mLs into the lungs every 2 (two) hours as needed (SOB/Wheezing).   Yes [provider]  metoprolol tartrate (LOPRESSOR) 50 MG tablet Take 1 tablet (50 mg total) by mouth 2 (two) times daily. 02/03/24 03/04/24 Yes Lanae Boast, MD  OLANZapine (ZYPREXA) 2.5 MG tablet Take 1 tablet (2.5 mg total) by mouth 2 (two) times daily as needed (agitation). 06/05/23  Yes Linwood Dibbles, MD  predniSONE (DELTASONE) 10 MG tablet Take PO 4 tabs daily x 2 days,3 tabs daily x 2 days,2 tabs daily x 2 days,1 tab daily x 2 days then stop. Patient not taking: Reported on 02/15/2024 02/03/24   Lanae Boast, MD    Current Facility-Administered Medications  Medication Dose Route Frequency Provider Last Rate Last Admin   acetaminophen (TYLENOL) tablet 650 mg  650 mg Oral Q6H PRN Gertha Calkin, MD       Or   acetaminophen (TYLENOL) suppository 650 mg  650 mg Rectal Q6H PRN Gertha Calkin, MD       albuterol (PROVENTIL) (2.5 MG/3ML) 0.083% nebulizer solution 2.5 mg  2.5 mg Inhalation Q6H PRN Gertha Calkin, MD       atorvastatin (LIPITOR) tablet 20 mg  20 mg Oral Daily Irena Cords V, MD   20 mg at 02/16/24 0933   azithromycin (ZITHROMAX) 500 mg in sodium chloride 0.9 % 250 mL IVPB  500 mg Intravenous Q24H Irena Cords V, MD       cefTRIAXone (ROCEPHIN) 1 g in sodium chloride 0.9 % 100 mL IVPB  1 g Intravenous Q24H Irena Cords V, MD 200 mL/hr at 02/16/24 0945 1 g at 02/16/24 0945   escitalopram (LEXAPRO) tablet 5 mg  5 mg Oral Daily Irena Cords V, MD   5 mg at 02/16/24 0932   heparin ADULT infusion 100 units/mL (25000 units/234mL)  1,250 Units/hr Intravenous Continuous Irena Cords V, MD 12.5 mL/hr at 02/15/24 2201 1,250  Units/hr at 02/15/24 2201   HYDROcodone-acetaminophen (NORCO/VICODIN) 5-325 MG per tablet 1 tablet  1 tablet Oral Q4H PRN Gertha Calkin, MD       insulin aspart (novoLOG) injection 0-6 Units  0-6 Units Subcutaneous TID WC Gertha Calkin, MD   2 Units at 02/15/24 1819   ipratropium-albuterol (DUONEB) 0.5-2.5 (3) MG/3ML nebulizer solution 3 mL  3 mL Inhalation Q2H PRN Gertha Calkin, MD       magnesium sulfate IVPB 2 g 50 mL  2 g Intravenous Once Osvaldo Shipper, MD       metoprolol tartrate (LOPRESSOR) tablet 50 mg  50 mg Oral BID Gertha Calkin, MD   50 mg at 02/16/24 0933   mometasone-formoterol (DULERA) 200-5 MCG/ACT inhaler 2 puff  2 puff Inhalation BID Gertha Calkin, MD   2 puff at 02/16/24 0925   morphine (PF) 2 MG/ML injection 2 mg  2 mg Intravenous Q4H PRN  Gertha Calkin, MD       OLANZapine [facility]) tablet 2.5 mg  2.5 mg Oral BID PRN Gertha Calkin, MD       pantoprazole (PROTONIX) injection 40 mg  40 mg Intravenous Q12H Gertha Calkin, MD   40 mg at 02/16/24 1914   sodium chloride flush (NS) 0.9 % injection 3 mL  3 mL Intravenous Q12H Gertha Calkin, MD   3 mL at 02/16/24 7829    Allergies as of 02/15/2024   (No Known Allergies)    Family History  Problem Relation Age of Onset   Diabetes Mother    Diabetes Father     Social History   Socioeconomic History   Marital status: Married    Spouse name: Not on file   Number of children: Not on file   Years of education: Not on file   Highest education level: Not on file  Occupational History   Not on file  Tobacco Use   Smoking status: Never   Smokeless tobacco: Never  Vaping Use   Vaping status: Never Used  Substance and Sexual Activity   Alcohol use: Yes    Alcohol/week: 3.0 standard drinks of alcohol    Types: 3 Standard drinks or equivalent per week   Drug use: Yes    Types: Marijuana   Sexual activity: Not on file  Other Topics Concern   Not on file  Social History Narrative   Not on file   Social Drivers of Health    Financial Resource Strain: Not on file  Food Insecurity: No Food Insecurity (02/15/2024)   Hunger Vital Sign    Worried About Running Out of Food in the Last Year: Never true    Ran Out of Food in the Last Year: Never true  Transportation Needs: No Transportation Needs (02/15/2024)   PRAPARE - Administrator, Civil Service (Medical): No    Lack of Transportation (Non-Medical): No  Physical Activity: Not on file  Stress: Not on file  Social Connections: Socially Integrated (02/15/2024)   Social Connection and Isolation Panel [NHANES]    Frequency of Communication with Friends and Family: Three times a week    Frequency of Social Gatherings with Friends and Family: Twice a week    Attends Religious Services: 1 to 4 times per year    Active Member of Clubs or Organizations: Yes    Attends Banker Meetings: 1 to 4 times per year    Marital Status: Married  Catering manager Violence: Not At Risk (02/15/2024)   Humiliation, Afraid, Rape, and Kick questionnaire    Fear of Current or Ex-Partner: No    Emotionally Abused: No    Physically Abused: No    Sexually Abused: No    Review of Systems: As per HPI, all others negative  Physical Exam: Vital signs in last 24 hours: Temp:  [97.4 F (36.3 C)-98.6 F (37 C)] 97.4 F (36.3 C) (04/05 0801) Pulse Rate:  [75-88] 80 (04/05 0801) Resp:  [17-22] 20 (04/05 0801) BP: (106-137)/(65-90) 137/88 (04/05 0801) SpO2:  [96 %-100 %] 100 % (04/05 0931) Weight:  [77.9 kg-78.1 kg] 77.9 kg (04/05 0500) Last BM Date : 02/14/24 General:   Alert,  Well-developed, well-nourished, pleasant and cooperative in NAD Head:  Normocephalic and atraumatic. Eyes:  Sclera clear, no icterus.   Conjunctiva pale Ears:  Normal auditory acuity. Nose:  No deformity, discharge,  or lesions. Mouth:  No deformity or lesions.  Oropharynx pale  and dry Neck:  Supple; no masses or thyromegaly. Lungs:  Mild dyspnea; no other visible respiratory  distress Abdomen:  Soft, nontender and nondistended. No masses, hepatosplenomegaly or hernias noted. Normal bowel sounds, without guarding, and without rebound.     Msk:  Symmetrical without gross deformities. Normal posture. Pulses:  Normal pulses noted. Extremities:  Without clubbing or edema. Neurologic:  Alert and  oriented x4;  grossly normal neurologically. Skin:  Intact without significant lesions or rashes. Psych:  Alert and cooperative. Normal mood and affect.   Lab Results: Recent Labs    02/15/24 0436 02/15/24 1701 02/16/24 0250 02/16/24 0852  WBC 8.5  --  7.0  --   HGB 8.4* 8.3* 7.9* 8.1*  HCT 30.5* 30.2* 28.6* 28.5*  PLT 246  --  215  --    BMET Recent Labs    02/15/24 0436 02/16/24 0250  NA 144 139  K 4.8 5.6*  CL 110 106  CO2 26 27  GLUCOSE 74 119*  BUN 42* 39*  CREATININE 2.04* 1.85*  CALCIUM 8.3* 8.8*   LFT Recent Labs    02/15/24 1701 02/16/24 0250  PROT 5.5* 5.2*  ALBUMIN 2.7* 2.6*  AST 23 16  ALT 21 19  ALKPHOS 44 43  BILITOT 0.4 0.4  BILIDIR <0.1  --   IBILI NOT CALCULATED  --    PT/INR No results for input(s): "LABPROT", "INR" in the last 72 hours.  Studies/Results: CT Angio Chest PE W/Cm &/Or Wo Cm Result Date: 02/15/2024 CLINICAL DATA:  Shortness of breath EXAM: CT ANGIOGRAPHY CHEST WITH CONTRAST TECHNIQUE: Multidetector CT imaging of the chest was performed using the standard protocol during bolus administration of intravenous contrast. Multiplanar CT image reconstructions and MIPs were obtained to evaluate the vascular anatomy. RADIATION DOSE REDUCTION: This exam was performed according to the departmental dose-optimization program which includes automated exposure control, adjustment of the mA and/or kV according to patient size and/or use of iterative reconstruction technique. CONTRAST:  60mL OMNIPAQUE IOHEXOL 350 MG/ML SOLN COMPARISON:  X-ray earlier 02/15/2024. CT without contrast 01/27/2024 FINDINGS: Cardiovascular: There is  enlarged heart. Coronary calcifications are seen. Small pericardial effusion. Thoracic aorta is normal course and caliber with partially calcified atherosclerotic plaque. There is a bovine type aortic arch, normal variant. Filling defects seen along branches of the pulmonary artery in the right lower lobe, segmental. Some areas also suggested in the left lower lobe consistent with pulmonary emboli. Mild overall clot burden. No reflux of contrast into the intrahepatic IVC. Mediastinum/Nodes: Preserved thyroid gland. The thoracic esophagus is normal course and caliber. No specific abnormal lymph node enlargement seen in the axillary region. Some small less than 1 cm size mediastinal nodes are seen, not pathologic by size criteria. Slightly larger right hilar node identified measuring 18 by 13 mm on series 7, image 202. of note there is also shift of the mediastinum from right to left with left lung volume loss. Lungs/Pleura: Centrilobular emphysematous changes identified. Once again there is also lower lobe areas of bronchiectasis with mucous plugging. Parenchymal opacities identified as well along both lower lobes with small amount of along the dependent lingula. These areas are similar to previous. Opacity in the more mid upper left upper lobe have shown improvement. Opacities in the right upper lobe are also overall less in amount but there are some new areas more posteriorly in the right upper lobe on series 8, image 69. 5 mm nodule seen along the course of the minor fissure, stable. Diffuse breathing motion.  No pneumothorax. Upper Abdomen: Complex upper pole left-sided renal cyst identified as on prior. Please correlate prior abdomen pelvis CT. Preserved adrenal glands. Prior cholecystectomy Musculoskeletal: Curvature and degenerative changes seen along the spine. Review of the MIP images confirms the above findings. Critical Value/emergent results were called by telephone at the time of interpretation on  02/15/2024 at 10:55 am to provider Dr. Rubin Payor, who verbally acknowledged these results. IMPRESSION: Bilateral lower lobe segmental pulmonary emboli.  Mild clot burden. Slightly improving opacities in both upper lobes with a subtle new area of mild ground-glass opacity right upper lobe. Areas of opacity in both both lower lobes are similar to the prior CT scan. Volume loss and shift of the mediastinum from right to left. Complex left-sided renal cystic foci. Please correlate with prior abdomen pelvis CT Aortic Atherosclerosis (ICD10-I70.0) and Emphysema (ICD10-J43.9). Electronically Signed   By: Karen Kays M.D.   On: 02/15/2024 10:56   DG Chest 2 View Result Date: 02/15/2024 CLINICAL DATA:  Shortness of breath. EXAM: CHEST - 2 VIEW COMPARISON:  02/03/2024 FINDINGS: Subtle retrocardiac opacity is similar to prior compatible with atelectasis or infiltrate. Tiny left pleural effusion. No dense consolidation in the right lung. No pneumothorax. Cardiopericardial silhouette is at upper limits of normal for size. No acute bony abnormality. Degenerative changes are noted in the right shoulder. IMPRESSION: 1. No substantial change in exam. 2. Subtle retrocardiac opacity compatible with atelectasis or infiltrate. 3. Tiny left pleural effusion. Electronically Signed   By: Kennith Center M.D.   On: 02/15/2024 05:17    Impression:   Recurrent anemia.  Hemoccult positive without overt bleeding. Recent admission pneumonia and hypoxic respiratory failure. Newly diagnosed pulmonary embolism. 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  Plan:   Patient is at high risk for repeat endoscopy/colonoscopy at this time, given his recent hypoxic respiratory failure as well as newly diagnosed pulmonary emboli. At this point, I would advise IV iron and PPI and follow Hgb. Transfuse PRBCs if needed. Would treat PE's with heparin drip and see if there is appreciable persistent drop in Hgb and/or overt bleeding,  would have to reconsider endoscopy/colonoscopy this admission; if not, then would ideally treat medically with plan for egd/colonoscopy as outpatient in the next few months. Clear liquid diet OK. [facility] will follow.   LOS: 0 days   Keiri Solano M  02/16/2024, 11:23 AM  Cell [phone] If no answer or after 5 PM call [phone]

## 2024-02-16 NOTE — Progress Notes (Signed)
 PHARMACY - ANTICOAGULATION  Pharmacy Consult for Heparin Indication: pulmonary embolus  No Known Allergies  Patient Measurements: Height: 5\' 11"  (180.3 cm) Weight: 77.9 kg (171 lb 11.8 oz) IBW/kg (Calculated) : 75.3 HEPARIN DW (KG): 78.1  Vital Signs: Temp: 98.4 F (36.9 C) (04/05 1155) Temp Source: Oral (04/05 1155) BP: 125/82 (04/05 1155) Pulse Rate: 80 (04/05 0801)  Labs: Recent Labs    02/15/24 0436 02/15/24 0619 02/15/24 1701 02/16/24 0250 02/16/24 0852 02/16/24 1012  HGB 8.4*  --  8.3* 7.9* 8.1*  --   HCT 30.5*  --  30.2* 28.6* 28.5*  --   PLT 246  --   --  215  --   --   HEPARINUNFRC  --   --   --  0.74*  --  0.77*  CREATININE 2.04*  --   --  1.85*  --   --   TROPONINIHS 27* 22*  --   --   --   --     Estimated Creatinine Clearance: 33.9 mL/min (A) (by C-G formula based on SCr of 1.85 mg/dL (H)).  Assessment: 83 year old male with PE. PMH signficant for CKD IIIb, NSTEMI, pulmonary HTN, prostate cancer. Not on anticoagulants PTA. Pharmacy is consulted for heparin management.  Heparin level 0.77 is supratherapeutic with heparin running at 1250 units/hr. Hgb (8.1) and PLTs (215) are stable. FOBT is positive, no drop in Hgb. Per RN, no report of pauses, issues with the line, or signs of bleeding.    Goal of Therapy:  Heparin level 0.3-0.7 units/ml Monitor platelets by anticoagulation protocol: Yes   Plan:  Decrease heparin infusion to 1150 units/hr Check 6 hour heparin level Monitor daily heparin level, CBC, and signs/symptoms of bleeding  Burnett Kanaris 02/16/2024,12:38 PM

## 2024-02-17 ENCOUNTER — Inpatient Hospital Stay (HOSPITAL_COMMUNITY)

## 2024-02-17 DIAGNOSIS — M7989 Other specified soft tissue disorders: Secondary | ICD-10-CM | POA: Diagnosis not present

## 2024-02-17 DIAGNOSIS — I2699 Other pulmonary embolism without acute cor pulmonale: Secondary | ICD-10-CM | POA: Diagnosis not present

## 2024-02-17 DIAGNOSIS — D509 Iron deficiency anemia, unspecified: Secondary | ICD-10-CM | POA: Diagnosis not present

## 2024-02-17 DIAGNOSIS — I10 Essential (primary) hypertension: Secondary | ICD-10-CM | POA: Diagnosis not present

## 2024-02-17 DIAGNOSIS — E875 Hyperkalemia: Secondary | ICD-10-CM | POA: Diagnosis not present

## 2024-02-17 LAB — GLUCOSE, CAPILLARY
Glucose-Capillary: 78 mg/dL (ref 70–99)
Glucose-Capillary: 77 mg/dL (ref 70–99)
Glucose-Capillary: 98 mg/dL (ref 70–99)
Glucose-Capillary: 124 mg/dL — ABNORMAL HIGH (ref 70–99)
Glucose-Capillary: 137 mg/dL — ABNORMAL HIGH (ref 70–99)

## 2024-02-17 LAB — BASIC METABOLIC PANEL WITH GFR
Anion gap: 7 (ref 5–15)
BUN: 33 mg/dL — ABNORMAL HIGH (ref 8–23)
CO2: 28 mmol/L (ref 22–32)
Calcium: 8.8 mg/dL — ABNORMAL LOW (ref 8.9–10.3)
Chloride: 107 mmol/L (ref 98–111)
Creatinine, Ser: 1.93 mg/dL — ABNORMAL HIGH (ref 0.61–1.24)
GFR, Estimated: 35 mL/min — ABNORMAL LOW (ref >60)
Glucose, Bld: 79 mg/dL (ref 70–99)
Potassium: 4.5 mmol/L (ref 3.5–5.1)
Sodium: 142 mmol/L (ref 135–145)

## 2024-02-17 LAB — HEMOGLOBIN AND HEMATOCRIT, BLOOD
HCT: 31.4 % — ABNORMAL LOW (ref 39.0–52.0)
Hemoglobin: 8.9 g/dL — ABNORMAL LOW (ref 13.0–17.0)

## 2024-02-17 LAB — CBC
HCT: 28.5 % — ABNORMAL LOW (ref 39.0–52.0)
Hemoglobin: 8 g/dL — ABNORMAL LOW (ref 13.0–17.0)
MCH: 21.3 pg — ABNORMAL LOW (ref 26.0–34.0)
MCHC: 28.1 g/dL — ABNORMAL LOW (ref 30.0–36.0)
MCV: 76 fL — ABNORMAL LOW (ref 80.0–100.0)
Platelets: 214 10*3/uL (ref 150–400)
RBC: 3.75 MIL/uL — ABNORMAL LOW (ref 4.22–5.81)
RDW: 22 % — ABNORMAL HIGH (ref 11.5–15.5)
WBC: 4.7 10*3/uL (ref 4.0–10.5)
nRBC: 0 % (ref 0.0–0.2)

## 2024-02-17 LAB — HEPARIN LEVEL (UNFRACTIONATED)
Heparin Unfractionated: 0.63 [IU]/mL (ref 0.30–0.70)
Heparin Unfractionated: 0.44 [IU]/mL (ref 0.30–0.70)

## 2024-02-17 LAB — MAGNESIUM: Magnesium: 2 mg/dL (ref 1.7–2.4)

## 2024-02-17 NOTE — Progress Notes (Signed)
 PHARMACY - ANTICOAGULATION  Pharmacy Consult for Heparin Indication: pulmonary embolus  No Known Allergies  Patient Measurements: Height: 5\' 11"  (180.3 cm) Weight: 77.2 kg (170 lb 3.1 oz) IBW/kg (Calculated) : 75.3 HEPARIN DW (KG): 78.1  Vital Signs: Temp: 98.3 F (36.8 C) (04/06 0351) Temp Source: Oral (04/06 0351) BP: 143/73 (04/06 0351) Pulse Rate: 85 (04/06 0351)  Labs: Recent Labs    02/15/24 0436 02/15/24 7829 02/15/24 1701 02/16/24 0250 02/16/24 0852 02/16/24 1012 02/16/24 1554 02/16/24 1810 02/17/24 0246  HGB 8.4*  --    < > 7.9* 8.1*  --  8.6*  --  8.0*  HCT 30.5*  --    < > 28.6* 28.5*  --  31.6*  --  28.5*  PLT 246  --   --  215  --   --   --   --  214  HEPARINUNFRC  --   --    < > 0.74*  --  0.77*  --  0.94* 0.63  CREATININE 2.04*  --   --  1.85*  --   --   --   --  1.93*  TROPONINIHS 27* 22*  --   --   --   --   --   --   --    < > = values in this interval not displayed.    Estimated Creatinine Clearance: 32.5 mL/min (A) (by C-G formula based on SCr of 1.93 mg/dL (H)).  Assessment: 83 year old male with PE. PMH signficant for CKD IIIb, NSTEMI, pulmonary HTN, prostate cancer. Not on anticoagulants PTA. Pharmacy is consulted for heparin management.  Heparin level 0.77 is supratherapeutic with heparin running at 1250 units/hr. Hgb (8.1) and PLTs (215) are stable. FOBT is positive, no drop in Hgb. Per RN, no report of pauses, issues with the line, or signs of bleeding.   4/6 AM update:  Heparin level therapeutic after rate decrease  Goal of Therapy:  Heparin level 0.3-0.7 units/ml Monitor platelets by anticoagulation protocol: Yes   Plan:  Cont heparin at 900 units/hr Heparin level in 8 hours  Abran Duke, PharmD, BCPS Clinical Pharmacist Phone: [phone]

## 2024-02-17 NOTE — Progress Notes (Signed)
 BLE venous duplex has been completed.  Preliminary findings given to Dr. Rito Ehrlich.   Results can be found under chart review under CV PROC. 02/17/2024 1:02 PM Vansh Reckart RVT, RDMS

## 2024-02-17 NOTE — Progress Notes (Signed)
 Subjective: No overt bleeding. No abdominal pain. Is hungry.  Objective: Vital signs in last 24 hours: Temp:  [98.2 F (36.8 C)-98.5 F (36.9 C)] 98.3 F (36.8 C) (04/06 0740) Pulse Rate:  [64-87] 64 (04/06 0740) Resp:  [18-20] 18 (04/06 0740) BP: (119-143)/(73-84) 126/82 (04/06 0740) SpO2:  [98 %-100 %] 98 % (04/06 0845) Weight:  [77.2 kg] 77.2 kg (04/06 0351) Weight change: -0.9 kg Last BM Date : 02/16/24  PE: GEN:  NAD ABD:  Soft, non-tender NEURO:  No encephalopathy  Lab Results: CBC    Component Value Date/Time   WBC 4.7 02/17/2024 0246   RBC 3.75 (L) 02/17/2024 0246   HGB 8.0 (L) 02/17/2024 0246   HGB 11.2 (L) 12/27/2023 1107   HCT 28.5 (L) 02/17/2024 0246   HCT 38.8 12/27/2023 1107   PLT 214 02/17/2024 0246   PLT 310 12/27/2023 1107   MCV 76.0 (L) 02/17/2024 0246   MCV 73 (L) 12/27/2023 1107   MCH 21.3 (L) 02/17/2024 0246   MCHC 28.1 (L) 02/17/2024 0246   RDW 22.0 (H) 02/17/2024 0246   RDW 19.0 (H) 12/27/2023 1107   LYMPHSABS 1.0 01/27/2024 1613   MONOABS 1.5 (H) 01/27/2024 1613   EOSABS 0.0 01/27/2024 1613   BASOSABS 0.0 01/27/2024 1613  CMP     Component Value Date/Time   NA 142 02/17/2024 0246   NA 142 12/27/2023 1106   K 4.5 02/17/2024 0246   CL 107 02/17/2024 0246   CO2 28 02/17/2024 0246   GLUCOSE 79 02/17/2024 0246   BUN 33 (H) 02/17/2024 0246   BUN 31 (H) 12/27/2023 1106   CREATININE 1.93 (H) 02/17/2024 0246   CALCIUM 8.8 (L) 02/17/2024 0246   PROT 5.2 (L) 02/16/2024 0250   PROT 6.6 12/27/2023 1106   ALBUMIN 2.6 (L) 02/16/2024 0250   ALBUMIN 3.9 12/27/2023 1106   AST 16 02/16/2024 0250   ALT 19 02/16/2024 0250   ALKPHOS 43 02/16/2024 0250   BILITOT 0.4 02/16/2024 0250   BILITOT 0.4 12/27/2023 1106   EGFR 34 (L) 12/27/2023 1106   GFRNONAA 35 (L) 02/17/2024 0246    Assessment:  Recurrent anemia.  Hemoccult positive without overt bleeding. Recent admission pneumonia and hypoxic respiratory failure. Newly diagnosed pulmonary  embolism. 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  Plan:  Patient is at high risk for repeat endoscopy/colonoscopy at this time, given his recent hypoxic respiratory failure as well as newly diagnosed pulmonary emboli. Suggest IV iron and PPI and follow Hgb and transfuse PRBCs if needed. Would for now continue to treat PE's with heparin drip and see if there is appreciable persistent drop in Hgb and/or overt bleeding, would have to reconsider endoscopy/colonoscopy this admission; if not, then would ideally treat medically with plan for egd/colonoscopy as outpatient in the next few months. Advance today to soft diet. [facility] will follow.   Freddy Jaksch 02/17/2024, 9:41 AM   Cell [phone] If no answer or after 5 PM call [phone]

## 2024-02-17 NOTE — Progress Notes (Signed)
 PHARMACY - ANTICOAGULATION CONSULT NOTE  Pharmacy Consult for Heparin Indication: pulmonary embolus  No Known Allergies  Patient Measurements: Height: 5\' 11"  (180.3 cm) Weight: 77.2 kg (170 lb 3.1 oz) IBW/kg (Calculated) : 75.3 HEPARIN DW (KG): 78.1  Vital Signs: Temp: 98.5 F (36.9 C) (04/06 1626) Temp Source: Oral (04/06 1626) BP: 117/56 (04/06 1626) Pulse Rate: 55 (04/06 1626)  Labs: Recent Labs    02/15/24 0436 02/15/24 0619 02/15/24 1701 02/16/24 0250 02/16/24 0852 02/16/24 1554 02/16/24 1810 02/17/24 0246 02/17/24 1845  HGB 8.4*  --    < > 7.9*   < > 8.6*  --  8.0* 8.9*  HCT 30.5*  --    < > 28.6*   < > 31.6*  --  28.5* 31.4*  PLT 246  --   --  215  --   --   --  214  --   HEPARINUNFRC  --   --   --  0.74*   < >  --  0.94* 0.63 0.44  CREATININE 2.04*  --   --  1.85*  --   --   --  1.93*  --   TROPONINIHS 27* 22*  --   --   --   --   --   --   --    < > = values in this interval not displayed.    Estimated Creatinine Clearance: 32.5 mL/min (A) (by C-G formula based on SCr of 1.93 mg/dL (H)).  Assessment: 83 year old male with PE. PMH signficant for CKD IIIb, NSTEMI, pulmonary HTN, prostate cancer. Not on anticoagulants PTA. Pharmacy is consulted for heparin management.   Heparin level therapeutic (0.63) this am on 900 units/hr and remains therapeutic (0.44) in pm.  Hgb 8.0 this am and 8.9 this pm. Platelet count stable. FOB+ stool on 4/4. No overt bleeding noted. GI following.  Goal of Therapy:  Heparin level 0.3-0.7 units/ml Monitor platelets by anticoagulation protocol: Yes   Plan:  Continue heparin drip at 900 units/hr. Next heparin level and CBC in am. Follow up for transition to oral anticoagulation.  Dennie Fetters, [place] 02/17/2024,7:54 PM

## 2024-02-17 NOTE — Progress Notes (Signed)
 Patient had 6 beat run Jerry Stakes, MD aware

## 2024-02-17 NOTE — Progress Notes (Addendum)
 [facility] PROGRESS NOTE   Jerry [name]. WUJ:[id number] DOB: 08/29/1941 DOA: 02/15/2024  PCP: [name], MD  Brief History: 83 y.o. 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    Evaluation during this hospitalization revealed pulmonary embolism on CT scan.  Patient was hospitalized for further management.    Consultants: [facility]  Procedures: None    Subjective/Interval History: Patient feels well.  Denies any chest pain or shortness of breath this morning.  States that he uses oxygen occasionally at home.  Has oxygen tank.     Assessment/Plan:  Acute pulmonary embolism/acute respiratory failure with hypoxia No recent travel but the patient was hospitalized in March for pneumonia. Patient was started on IV heparin.   Saturations are in the late 90s on 2 L.  Try to wean it off.  Patient however does mention that as he has oxygen at home which he uses on an as-needed basis. Echocardiogram in February 2025 showed RV function to be mildly reduced.  LVEF of 55 to 60%.  Do not see any clear indication to repeating the echo. Lower extremity Doppler studies pending. Hemoglobin has been stable.  No overt bleeding noted.  If hemoglobin remains stable tomorrow then anticipate the patient can be transition to oral anticoagulation.  See below as well.  Microcytic anemia/concern for GI bleed Patient with longstanding history of anemia.  Followed by [facility]  gastroenterology.  Underwent colonoscopy in November 2024 which showed multiple colonic angioectasias with bleeding that were treated with APC.  Noted to have internal hemorrhoids. Will be at high risk of bleeding due to need for anticoagulation. FOBT was positive. Seen by gastroenterology.  Not thought to be a good candidate for endoscopic evaluation due to high risk.  No overt bleeding has been noted. Continue IV heparin for another 24 hours. Hemoglobin stable for the most part. Continue PPI. Diet has been advanced since there is no plan for endoscopy as of now.  Concern for pneumonia Patient noted to be on azithromycin and ceftriaxone.  He was treated for pneumonia during recent hospitalization.  According to discharge summary from 3/23 he completed 7 days of ceftriaxone.  Patient's symptomatology more likely due to PE rather than an infectious process.  WBC is normal.  He is afebrile.  Do not see any clear indication to continue with antibiotics.  Will discontinue.  Pulmonary hypertension Now with PE.  See above.  Chronic kidney disease stage IIIb/hyperkalemia hypomagnesemia Based on previous labs his baseline seems to be around 1.9-2.0. Renal function close to baseline. Potassium level improved with Lokelma. Magnesium level has improved.  Essential hypertension Blood pressure is reasonably well-controlled.  Patient is on metoprolol.  Diabetes mellitus type 2 Monitor CBGs.  HbA1c was 6.2 in February.   DVT Prophylaxis: On IV heparin Code Status: Full code Family Communication: Will update his wife Disposition Plan: Hopefully return home when improved.  PT eval    Medications: Scheduled:  atorvastatin  20 mg Oral Daily   escitalopram  5 mg Oral Daily   insulin aspart  0-6 Units Subcutaneous TID WC  metoprolol tartrate  50 mg Oral BID   mometasone-formoterol  2 puff Inhalation BID   pantoprazole (PROTONIX) IV  40 mg Intravenous Q12H   sodium chloride flush  3 mL  Intravenous Q12H   Continuous:  azithromycin 500 mg (02/16/24 1354)   cefTRIAXone (ROCEPHIN)  IV 1 g (02/16/24 0945)   heparin 900 Units/hr (02/16/24 1936)   JXB:JYNWGNFAOZHYQ **OR** acetaminophen, albuterol, HYDROcodone-acetaminophen, ipratropium-albuterol, morphine injection, OLANZapine  Antibiotics: Anti-infectives (From admission, onward)    Start     Dose/Rate Route Frequency Ordered Stop   02/16/24 1300  azithromycin (ZITHROMAX) 500 mg in sodium chloride 0.9 % 250 mL IVPB        500 mg 250 mL/hr over 60 Minutes Intravenous Every 24 hours 02/15/24 1847     02/16/24 1000  cefTRIAXone (ROCEPHIN) 1 g in sodium chloride 0.9 % 100 mL IVPB        1 g 200 mL/hr over 30 Minutes Intravenous Every 24 hours 02/15/24 1847     02/15/24 1115  cefTRIAXone (ROCEPHIN) 1 g in sodium chloride 0.9 % 100 mL IVPB        1 g 200 mL/hr over 30 Minutes Intravenous  Once 02/15/24 1114 02/15/24 1221   02/15/24 1115  azithromycin (ZITHROMAX) 500 mg in sodium chloride 0.9 % 250 mL IVPB        500 mg 250 mL/hr over 60 Minutes Intravenous  Once 02/15/24 1114 02/15/24 1325       Objective:  Vital Signs  Vitals:   02/17/24 0014 02/17/24 0351 02/17/24 0740 02/17/24 0845  BP: 119/76 (!) 143/73 126/82   Pulse: 79 85 64   Resp:  20 18   Temp: 98.5 F (36.9 C) 98.3 F (36.8 C) 98.3 F (36.8 C)   TempSrc: Oral Oral Oral   SpO2: 99% 99% 99% 98%  Weight:  77.2 kg    Height:        Intake/Output Summary (Last 24 hours) at 02/17/2024 0920 Last data filed at 02/17/2024 0355 Gross per 24 hour  Intake 269.59 ml  Output 2751 ml  Net -2481.41 ml   Filed Weights   02/15/24 1410 02/16/24 0500 02/17/24 0351  Weight: 78.1 kg 77.9 kg 77.2 kg    General appearance: Awake alert.  In no distress Resp: Diminished air entry at the bases with no wheezing.  No definite crackles. Cardio: S1-S2 is normal regular.  No S3-S4.  No rubs murmurs or bruit GI: Abdomen is soft.  Nontender nondistended.  Bowel sounds are  present normal.  No masses organomegaly Extremities: No edema.  Full range of motion of lower extremities. Neurologic: Alert and oriented x3.  No focal neurological deficits.    Lab Results:  Data Reviewed: I have personally reviewed following labs and reports of the imaging studies  CBC: Recent Labs  Lab 02/15/24 0436 02/15/24 1701 02/16/24 0250 02/16/24 0852 02/16/24 1554 02/17/24 0246  WBC 8.5  --  7.0  --   --  4.7  HGB 8.4* 8.3* 7.9* 8.1* 8.6* 8.0*  HCT 30.5* 30.2* 28.6* 28.5* 31.6* 28.5*  MCV 79.0*  --  77.9*  --   --  76.0*  PLT 246  --  215  --   --  214    Basic Metabolic Panel: Recent Labs  Lab 02/15/24 0436 02/15/24 1701 02/16/24 0250 02/17/24 0246  NA 144  --  139 142  K 4.8  --  5.6* 4.5  CL 110  --  106 107  CO2 26  --  27 28  GLUCOSE 74  --  119* 79  BUN 42*  --  39* 33*  CREATININE 2.04*  --  1.85* 1.93*  CALCIUM 8.3*  --  8.8* 8.8*  MG  --  1.6* 1.6* 2.0    GFR: Estimated Creatinine Clearance: 32.5 mL/min (A) (by C-G formula based on SCr of 1.93 mg/dL (H)).  Liver Function Tests: Recent Labs  Lab 02/15/24 1701 02/16/24 0250  AST 23 16  ALT 21 19  ALKPHOS 44 43  BILITOT 0.4 0.4  PROT 5.5* 5.2*  ALBUMIN 2.7* 2.6*    CBG: Recent Labs  Lab 02/16/24 1144 02/16/24 1707 02/16/24 2041 02/17/24 0111 02/17/24 0526  GLUCAP 94 103* 106* 78 77     Recent Results (from the past 240 hours)  Resp panel by RT-PCR (RSV, Flu A&B, Covid) Anterior Nasal Swab     Status: None   Collection Time: 02/15/24  4:43 AM   Specimen: Anterior Nasal Swab  Result Value Ref Range Status   SARS Coronavirus 2 by RT PCR NEGATIVE NEGATIVE Final   Influenza A by PCR NEGATIVE NEGATIVE Final   Influenza B by PCR NEGATIVE NEGATIVE Final    Comment: (NOTE) The Xpert Xpress SARS-CoV-2/FLU/RSV plus assay is intended as an aid in the diagnosis of influenza from Nasopharyngeal swab specimens and should not be used as a sole basis for treatment. Nasal washings  and aspirates are unacceptable for Xpert Xpress SARS-CoV-2/FLU/RSV testing.  Fact Sheet for Patients: BloggerCourse.com  Fact Sheet for Healthcare Providers: SeriousBroker.it  This test is not yet approved or cleared by the [place] FDA and has been authorized for detection and/or diagnosis of SARS-CoV-2 by FDA under an Emergency Use Authorization (EUA). This EUA will remain in effect (meaning this test can be used) for the duration of the COVID-19 declaration under Section 564(b)(1) of the Act, 21 U.S.C. section 360bbb-3(b)(1), unless the authorization is terminated or revoked.     Resp Syncytial Virus by PCR NEGATIVE NEGATIVE Final    Comment: (NOTE) Fact Sheet for Patients: BloggerCourse.com  Fact Sheet for Healthcare Providers: SeriousBroker.it  This test is not yet approved or cleared by the [place] FDA and has been authorized for detection and/or diagnosis of SARS-CoV-2 by FDA under an Emergency Use Authorization (EUA). This EUA will remain in effect (meaning this test can be used) for the duration of the COVID-19 declaration under Section 564(b)(1) of the Act, 21 U.S.C. section 360bbb-3(b)(1), unless the authorization is terminated or revoked.  Performed at [facility], [address] [place], [place] [address]       Radiology Studies: CT Angio Chest PE W/Cm &/Or Wo Cm Result Date: 02/15/2024 CLINICAL DATA:  Shortness of breath EXAM: CT ANGIOGRAPHY CHEST WITH CONTRAST TECHNIQUE: Multidetector CT imaging of the chest was performed using the standard protocol during bolus administration of intravenous contrast. Multiplanar CT image reconstructions and MIPs were obtained to evaluate the vascular anatomy. RADIATION DOSE REDUCTION: This exam was performed according to the departmental dose-optimization program which includes automated exposure control,  adjustment of the mA and/or kV according to patient size and/or use of iterative reconstruction technique. CONTRAST:  60mL OMNIPAQUE IOHEXOL 350 MG/ML SOLN COMPARISON:  X-ray earlier 02/15/2024. CT without contrast 01/27/2024 FINDINGS: Cardiovascular: There is enlarged heart. Coronary calcifications are seen. Small pericardial effusion. Thoracic aorta is normal course and caliber with partially calcified atherosclerotic plaque. There is a bovine type aortic arch, normal variant. Filling defects seen along branches of the pulmonary artery in  the right lower lobe, segmental. Some areas also suggested in the left lower lobe consistent with pulmonary emboli. Mild overall clot burden. No reflux of contrast into the intrahepatic IVC. Mediastinum/Nodes: Preserved thyroid gland. The thoracic esophagus is normal course and caliber. No specific abnormal lymph node enlargement seen in the axillary region. Some small less than 1 cm size mediastinal nodes are seen, not pathologic by size criteria. Slightly larger right hilar node identified measuring 18 by 13 mm on series 7, image 202. of note there is also shift of the mediastinum from right to left with left lung volume loss. Lungs/Pleura: Centrilobular emphysematous changes identified. Once again there is also lower lobe areas of bronchiectasis with mucous plugging. Parenchymal opacities identified as well along both lower lobes with small amount of along the dependent lingula. These areas are similar to previous. Opacity in the more mid upper left upper lobe have shown improvement. Opacities in the right upper lobe are also overall less in amount but there are some new areas more posteriorly in the right upper lobe on series 8, image 69. 5 mm nodule seen along the course of the minor fissure, stable. Diffuse breathing motion. No pneumothorax. Upper Abdomen: Complex upper pole left-sided renal cyst identified as on prior. Please correlate prior abdomen pelvis CT. Preserved  adrenal glands. Prior cholecystectomy Musculoskeletal: Curvature and degenerative changes seen along the spine. Review of the MIP images confirms the above findings. Critical Value/emergent results were called by telephone at the time of interpretation on 02/15/2024 at 10:55 am to provider Dr. Rubin Payor, who verbally acknowledged these results. IMPRESSION: Bilateral lower lobe segmental pulmonary emboli.  Mild clot burden. Slightly improving opacities in both upper lobes with a subtle new area of mild ground-glass opacity right upper lobe. Areas of opacity in both both lower lobes are similar to the prior CT scan. Volume loss and shift of the mediastinum from right to left. Complex left-sided renal cystic foci. Please correlate with prior abdomen pelvis CT Aortic Atherosclerosis (ICD10-I70.0) and Emphysema (ICD10-J43.9). Electronically Signed   By: Karen Kays M.D.   On: 02/15/2024 10:56       LOS: 1 day   Niomi Valent  [facility] Pager on [email or web address]  02/17/2024, 9:20 AM

## 2024-02-17 NOTE — Plan of Care (Signed)

## 2024-02-18 ENCOUNTER — Telehealth (HOSPITAL_COMMUNITY): Payer: Self-pay | Admitting: Pharmacy Technician

## 2024-02-18 ENCOUNTER — Other Ambulatory Visit (HOSPITAL_COMMUNITY): Payer: Self-pay

## 2024-02-18 DIAGNOSIS — R0602 Shortness of breath: Secondary | ICD-10-CM | POA: Diagnosis not present

## 2024-02-18 LAB — GLUCOSE, CAPILLARY
Glucose-Capillary: 110 mg/dL — ABNORMAL HIGH (ref 70–99)
Glucose-Capillary: 95 mg/dL (ref 70–99)
Glucose-Capillary: 79 mg/dL (ref 70–99)

## 2024-02-18 LAB — CBC
HCT: 28.2 % — ABNORMAL LOW (ref 39.0–52.0)
Hemoglobin: 8.1 g/dL — ABNORMAL LOW (ref 13.0–17.0)
MCH: 22.1 pg — ABNORMAL LOW (ref 26.0–34.0)
MCHC: 28.7 g/dL — ABNORMAL LOW (ref 30.0–36.0)
MCV: 76.8 fL — ABNORMAL LOW (ref 80.0–100.0)
Platelets: 208 10*3/uL (ref 150–400)
RBC: 3.67 MIL/uL — ABNORMAL LOW (ref 4.22–5.81)
RDW: 21.8 % — ABNORMAL HIGH (ref 11.5–15.5)
WBC: 6.8 10*3/uL (ref 4.0–10.5)
nRBC: 0 % (ref 0.0–0.2)

## 2024-02-18 LAB — BASIC METABOLIC PANEL WITH GFR
Anion gap: 8 (ref 5–15)
BUN: 33 mg/dL — ABNORMAL HIGH (ref 8–23)
CO2: 27 mmol/L (ref 22–32)
Calcium: 8.5 mg/dL — ABNORMAL LOW (ref 8.9–10.3)
Chloride: 106 mmol/L (ref 98–111)
Creatinine, Ser: 2.23 mg/dL — ABNORMAL HIGH (ref 0.61–1.24)
GFR, Estimated: 29 mL/min — ABNORMAL LOW (ref >60)
Glucose, Bld: 151 mg/dL — ABNORMAL HIGH (ref 70–99)
Potassium: 4.6 mmol/L (ref 3.5–5.1)
Sodium: 141 mmol/L (ref 135–145)

## 2024-02-18 LAB — HEPARIN LEVEL (UNFRACTIONATED): Heparin Unfractionated: 0.44 [IU]/mL (ref 0.30–0.70)

## 2024-02-18 MED ORDER — APIXABAN (ELIQUIS) VTE STARTER PACK (10MG AND 5MG)
ORAL_TABLET | ORAL | 0 refills | Status: DC
  Filled 2024-02-18: qty 74, 30d supply, fill #0

## 2024-02-18 MED ORDER — APIXABAN 5 MG PO TABS: 5.0000 mg | ORAL_TABLET | Freq: Two times a day (BID) | ORAL | 2 refills | Status: DC

## 2024-02-18 MED ORDER — PANTOPRAZOLE SODIUM 40 MG PO TBEC
40.0000 mg | DELAYED_RELEASE_TABLET | Freq: Two times a day (BID) | ORAL | 2 refills | Status: AC
  Filled 2024-02-18 (×2): qty 60, 30d supply, fill #0

## 2024-02-18 MED ORDER — APIXABAN (ELIQUIS) VTE STARTER PACK (10MG AND 5MG): ORAL_TABLET | ORAL | 0 refills | Status: DC

## 2024-02-18 MED ORDER — APIXABAN 5 MG PO TABS
10.0000 mg | ORAL_TABLET | Freq: Two times a day (BID) | ORAL | Status: DC
  Administered 2024-02-18: 10 mg via ORAL
  Filled 2024-02-18: qty 2

## 2024-02-18 MED ORDER — APIXABAN 5 MG PO TABS
5.0000 mg | ORAL_TABLET | Freq: Two times a day (BID) | ORAL | 2 refills | Status: DC
  Filled 2024-02-18: qty 60, 30d supply, fill #0

## 2024-02-18 MED ORDER — PANTOPRAZOLE SODIUM 40 MG PO TBEC: 40.0000 mg | DELAYED_RELEASE_TABLET | Freq: Two times a day (BID) | ORAL | 2 refills | Status: DC

## 2024-02-18 MED ORDER — APIXABAN 5 MG PO TABS
5.0000 mg | ORAL_TABLET | Freq: Two times a day (BID) | ORAL | Status: DC
Start: 2024-02-25 — End: 2024-02-18

## 2024-02-18 NOTE — Discharge Summary (Incomplete)
 Physician Discharge Summary   Patient: Jerry [name]. MRN: [id number] DOB: 02-08-1941  Admit date:     02/15/2024  Discharge date: 02/18/24  Discharge Physician: MDALA-GAUSI, Gwenette Greet   PCP: [name], MD   Recommendations at discharge:  {Tip this will not be part of the note when signed- Example include specific recommendations for outpatient follow-up, pending tests to follow-up on. (Optional):26781}  ***  Discharge Diagnoses: Principal Problem:   SOB (shortness of breath) Active Problems:   Acute pulmonary embolism (HCC)  Resolved Problems:   * No resolved hospital problems. [facility] Course: No notes on file  Assessment and Plan: No notes have been filed under this hospital service. Service: Hospitalist     {Tip this will not be part of the note when signed Body mass index is 23.58 kg/m. , ,  (Optional):26781}  {(NOTE) Pain control PDMP Statment (Optional):26782} Consultants: *** Procedures performed: ***  Disposition: {Plan; Disposition:26390} Diet recommendation:  Discharge Diet Orders (From admission, onward)     Start     Ordered   02/18/24 0000  Diet - low sodium heart healthy        02/18/24 1335           {Diet_Plan:26776} DISCHARGE MEDICATION: Allergies as of 02/18/2024   No Known Allergies      Medication List     STOP taking these medications    predniSONE 10 MG tablet Commonly known as: DELTASONE       TAKE these medications    albuterol 108 (90 Base) MCG/ACT inhaler Commonly known as: VENTOLIN HFA Inhale 2 puffs into the lungs every 6 (six) hours as needed for wheezing or shortness of breath.   ALPRAZolam 0.5 MG tablet Commonly known as: XANAX Take 0.5 mg by mouth 2 (two) times daily as needed for anxiety.   Eliquis DVT/PE Starter Pack Generic drug: Apixaban Starter Pack (10mg  and 5mg ) Take as directed on package: start with two-5mg  tablets twice daily for 7 days. On day 8, switch to one-5mg  tablet twice  daily.   apixaban 5 MG Tabs tablet Commonly known as: ELIQUIS Take 1 tablet (5 mg total) by mouth 2 (two) times daily. Start taking on: Mar 19, 2024   aspirin EC 81 MG tablet Take 1 tablet (81 mg total) by mouth daily. Swallow whole.   atorvastatin 20 MG tablet Commonly known as: LIPITOR Take 1 tablet (20 mg total) by mouth daily.   Cholecalciferol 25 MCG (1000 UT) tablet Take 1,000 Units by mouth daily.   Dulera 200-5 MCG/ACT Aero Generic drug: mometasone-formoterol Inhale 2 puffs into the lungs 2 (two) times daily.   escitalopram 5 MG tablet Commonly known as: LEXAPRO Take 1 tablet (5 mg total) by mouth daily.   Folivane-Plus Caps Take 1 capsule by mouth daily.   furosemide 40 MG tablet Commonly known as: LASIX Take 0.5 tablets (20 mg total) by mouth daily.   ipratropium-albuterol 0.5-2.5 (3) MG/3ML Soln Commonly known as: DUONEB Inhale 3 mLs into the lungs every 2 (two) hours as needed (SOB/Wheezing).   metoprolol tartrate 50 MG tablet Commonly known as: LOPRESSOR Take 1 tablet (50 mg total) by mouth 2 (two) times daily.   OLANZapine 2.5 MG tablet Commonly known as: ZyPREXA Take 1 tablet (2.5 mg total) by mouth 2 (two) times daily as needed (agitation).   pantoprazole 40 MG tablet Commonly known as: Protonix Take 1 tablet (40 mg total) by mouth 2 (two) times daily.   VITAMIN B12 PO Take 1  tablet by mouth daily.        Follow-up Information     [name], MD Follow up.   Specialty: Internal Medicine Why: April 9th @11 :30 Contact information: [address] [place] [address] [phone]         Care, [facility] Follow up.   Specialty: Home Health Services Why: Agency will call you to coordinate apt times Contact information: [address] [place] [address] [phone]                Discharge Exam: Filed Weights   02/16/24 0500 02/17/24 0351 02/18/24 0411  Weight: 77.9 kg 77.2 kg  76.7 kg   ***  Condition at discharge: {DC Condition:26389}  The results of significant diagnostics from this hospitalization (including imaging, microbiology, ancillary and laboratory) are listed below for reference.   Imaging Studies: VAS [place] LOWER EXTREMITY VENOUS (DVT) Result Date: 02/17/2024  Lower Venous DVT Study Patient Name:  Jerry [name].  Date of Exam:   02/17/2024 Medical Rec #: [id number]          Accession #:    [id number] Date of Birth: [birth year]           Patient Gender: M Patient Age:   83 years Exam Location:  [facility] Procedure:      VAS [place] LOWER EXTREMITY VENOUS (DVT) Referring Phys: Osvaldo Shipper --------------------------------------------------------------------------------  Indications: SOB, and pulmonary embolism.  Comparison Study: Previous exam on 01/27/2024 (RLEV) was negative for DVT Performing Technologist: Ernestene Mention RVT, RDMS  Examination Guidelines: A complete evaluation includes B-mode imaging, spectral Doppler, color Doppler, and power Doppler as needed of all accessible portions of each vessel. Bilateral testing is considered an integral part of a complete examination. Limited examinations for reoccurring indications may be performed as noted. The reflux portion of the exam is performed with the patient in reverse Trendelenburg.  +---------+---------------+---------+-----------+----------+--------------+ RIGHT    CompressibilityPhasicitySpontaneityPropertiesThrombus Aging +---------+---------------+---------+-----------+----------+--------------+ CFV      Full           Yes      Yes                                 +---------+---------------+---------+-----------+----------+--------------+ SFJ      Full                                                        +---------+---------------+---------+-----------+----------+--------------+ FV Prox  Full           Yes      Yes                                  +---------+---------------+---------+-----------+----------+--------------+ FV Mid   Full           Yes      Yes                                 +---------+---------------+---------+-----------+----------+--------------+ FV DistalFull           Yes      Yes                                 +---------+---------------+---------+-----------+----------+--------------+  PFV      Full                                                        +---------+---------------+---------+-----------+----------+--------------+ POP      Full           Yes      Yes                                 +---------+---------------+---------+-----------+----------+--------------+ PTV      Full                                                        +---------+---------------+---------+-----------+----------+--------------+ PERO     Full                                                        +---------+---------------+---------+-----------+----------+--------------+   +---------+---------------+---------+-----------+----------+--------------+ LEFT     CompressibilityPhasicitySpontaneityPropertiesThrombus Aging +---------+---------------+---------+-----------+----------+--------------+ CFV      Full           Yes      Yes                                 +---------+---------------+---------+-----------+----------+--------------+ SFJ      Full                                                        +---------+---------------+---------+-----------+----------+--------------+ FV Prox  Full           Yes      Yes                                 +---------+---------------+---------+-----------+----------+--------------+ FV Mid   Full           Yes      Yes                                 +---------+---------------+---------+-----------+----------+--------------+ FV DistalFull           Yes      Yes                                  +---------+---------------+---------+-----------+----------+--------------+ PFV      Full                                                        +---------+---------------+---------+-----------+----------+--------------+  POP      Partial        Yes      Yes                  Acute          +---------+---------------+---------+-----------+----------+--------------+ PTV      Full                                                        +---------+---------------+---------+-----------+----------+--------------+ PERO     Full                                                        +---------+---------------+---------+-----------+----------+--------------+     Summary: BILATERAL: -No evidence of popliteal cyst, bilaterally. RIGHT: - There is no evidence of deep vein thrombosis in the lower extremity.  LEFT: - Findings consistent with acute deep vein thrombosis involving the left popliteal vein.   *See table(s) above for measurements and observations. Electronically signed by Coral Else MD on 02/17/2024 at 1:28:34 PM.    Final    CT Angio Chest PE W/Cm &/Or Wo Cm Result Date: 02/15/2024 CLINICAL DATA:  Shortness of breath EXAM: CT ANGIOGRAPHY CHEST WITH CONTRAST TECHNIQUE: Multidetector CT imaging of the chest was performed using the standard protocol during bolus administration of intravenous contrast. Multiplanar CT image reconstructions and MIPs were obtained to evaluate the vascular anatomy. RADIATION DOSE REDUCTION: This exam was performed according to the departmental dose-optimization program which includes automated exposure control, adjustment of the mA and/or kV according to patient size and/or use of iterative reconstruction technique. CONTRAST:  60mL OMNIPAQUE IOHEXOL 350 MG/ML SOLN COMPARISON:  X-ray earlier 02/15/2024. CT without contrast 01/27/2024 FINDINGS: Cardiovascular: There is enlarged heart. Coronary calcifications are seen. Small pericardial effusion. Thoracic aorta is  normal course and caliber with partially calcified atherosclerotic plaque. There is a bovine type aortic arch, normal variant. Filling defects seen along branches of the pulmonary artery in the right lower lobe, segmental. Some areas also suggested in the left lower lobe consistent with pulmonary emboli. Mild overall clot burden. No reflux of contrast into the intrahepatic IVC. Mediastinum/Nodes: Preserved thyroid gland. The thoracic esophagus is normal course and caliber. No specific abnormal lymph node enlargement seen in the axillary region. Some small less than 1 cm size mediastinal nodes are seen, not pathologic by size criteria. Slightly larger right hilar node identified measuring 18 by 13 mm on series 7, image 202. of note there is also shift of the mediastinum from right to left with left lung volume loss. Lungs/Pleura: Centrilobular emphysematous changes identified. Once again there is also lower lobe areas of bronchiectasis with mucous plugging. Parenchymal opacities identified as well along both lower lobes with small amount of along the dependent lingula. These areas are similar to previous. Opacity in the more mid upper left upper lobe have shown improvement. Opacities in the right upper lobe are also overall less in amount but there are some new areas more posteriorly in the right upper lobe on series 8, image 69. 5 mm nodule seen along the course of the minor fissure, stable. Diffuse breathing motion. No pneumothorax. Upper Abdomen: Complex upper  pole left-sided renal cyst identified as on prior. Please correlate prior abdomen pelvis CT. Preserved adrenal glands. Prior cholecystectomy Musculoskeletal: Curvature and degenerative changes seen along the spine. Review of the MIP images confirms the above findings. Critical Value/emergent results were called by telephone at the time of interpretation on 02/15/2024 at 10:55 am to provider Dr. Rubin Payor, who verbally acknowledged these results. IMPRESSION:  Bilateral lower lobe segmental pulmonary emboli.  Mild clot burden. Slightly improving opacities in both upper lobes with a subtle new area of mild ground-glass opacity right upper lobe. Areas of opacity in both both lower lobes are similar to the prior CT scan. Volume loss and shift of the mediastinum from right to left. Complex left-sided renal cystic foci. Please correlate with prior abdomen pelvis CT Aortic Atherosclerosis (ICD10-I70.0) and Emphysema (ICD10-J43.9). Electronically Signed   By: Karen Kays M.D.   On: 02/15/2024 10:56   DG Chest 2 View Result Date: 02/15/2024 CLINICAL DATA:  Shortness of breath. EXAM: CHEST - 2 VIEW COMPARISON:  02/03/2024 FINDINGS: Subtle retrocardiac opacity is similar to prior compatible with atelectasis or infiltrate. Tiny left pleural effusion. No dense consolidation in the right lung. No pneumothorax. Cardiopericardial silhouette is at upper limits of normal for size. No acute bony abnormality. Degenerative changes are noted in the right shoulder. IMPRESSION: 1. No substantial change in exam. 2. Subtle retrocardiac opacity compatible with atelectasis or infiltrate. 3. Tiny left pleural effusion. Electronically Signed   By: Kennith Center M.D.   On: 02/15/2024 05:17   DG Chest Port 1 View Result Date: 02/03/2024 CLINICAL DATA:  Pneumonia. EXAM: PORTABLE CHEST 1 VIEW COMPARISON:  01/31/2024 and CT chest 01/27/2024. FINDINGS: Patient is slightly rotated. Trachea is midline. Heart is enlarged, stable. Mild bibasilar streaky opacities, left greater than right. Small left pleural effusion. IMPRESSION: 1. Patchy bibasilar atelectasis, left greater than right. Difficult to exclude residual pneumonia when compared with 01/27/2024. 2. Small left pleural effusion. Electronically Signed   By: Leanna Battles M.D.   On: 02/03/2024 12:09   DG Chest Port 1 View Result Date: 01/31/2024 CLINICAL DATA:  Shortness of breath. EXAM: PORTABLE CHEST 1 VIEW COMPARISON:  January 27, 2024.  FINDINGS: Stable cardiomediastinal silhouette. Right lung is clear. Mild left basilar atelectasis or infiltrate is noted with possible small pleural effusion. Bony thorax is unremarkable. IMPRESSION: Mild left basilar atelectasis or infiltrate is noted with small left pleural effusion. Electronically Signed   By: Lupita Raider M.D.   On: 01/31/2024 15:00   CARDIAC CATHETERIZATION Result Date: 01/29/2024   Prox RCA lesion is 25% stenosed.   LV end diastolic pressure is mildly elevated.   Hemodynamic findings consistent with moderate pulmonary hypertension. Minimal nonobstructive CAD Mildly elevated LV filling pressures. LVEDP 19 mm Hg. PCWP 24/27 with mean 19 mm Hg Moderate pulmonary HTN. PAP 65/30 with mean 40 mm Hg Cardiac output 5.49 L/min, index 2.83 Plan: medical management. Type 2 NSTEMI   PERIPHERAL VASCULAR CATHETERIZATION Result Date: 01/29/2024   Prox RCA lesion is 25% stenosed.   LV end diastolic pressure is mildly elevated.   Hemodynamic findings consistent with moderate pulmonary hypertension. Minimal nonobstructive CAD Mildly elevated LV filling pressures. LVEDP 19 mm Hg. PCWP 24/27 with mean 19 mm Hg Moderate pulmonary HTN. PAP 65/30 with mean 40 mm Hg Cardiac output 5.49 L/min, index 2.83 Plan: medical management. Type 2 NSTEMI   VAS [place] LOWER EXTREMITY VENOUS (DVT) (7a-7p) Result Date: 01/28/2024  Lower Venous DVT Study Patient Name:  Jerry [name].  Date of Exam:   01/27/2024 Medical Rec #: [id number]          Accession #:    [id number] Date of Birth: [birth year]           Patient Gender: M Patient Age:   8 years Exam Location:  [facility] Procedure:      VAS [place] LOWER EXTREMITY VENOUS (DVT) Referring Phys: Gwyneth Sprout --------------------------------------------------------------------------------  Indications: Edema, Syncope, and SOB.  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 Comparison  Study: No prior study on file Performing Technologist: Sherren Kerns RVS  Examination Guidelines: A complete evaluation includes B-mode imaging, spectral Doppler, color Doppler, and power Doppler as needed of all accessible portions of each vessel. Bilateral testing is considered an integral part of a complete examination. Limited examinations for reoccurring indications may be performed as noted. The reflux portion of the exam is performed with the patient in reverse Trendelenburg.  +---------+---------------+---------+-----------+---------------+-------------+ RIGHT    CompressibilityPhasicitySpontaneityProperties     Thrombus                                                                 Aging         +---------+---------------+---------+-----------+---------------+-------------+ CFV      Full           Yes      Yes        Pulsatile                                                                waveform                     +---------+---------------+---------+-----------+---------------+-------------+ SFJ      Full                                                            +---------+---------------+---------+-----------+---------------+-------------+ FV Prox  Full                                                            +---------+---------------+---------+-----------+---------------+-------------+ FV Mid   Full                                                            +---------+---------------+---------+-----------+---------------+-------------+ FV DistalFull                                                            +---------+---------------+---------+-----------+---------------+-------------+  PFV      Full                                                            +---------+---------------+---------+-----------+---------------+-------------+ POP      Full           Yes      No         Pulsatile                                                                 waveform                     +---------+---------------+---------+-----------+---------------+-------------+ PTV      Full                                                            +---------+---------------+---------+-----------+---------------+-------------+ PERO     Full                                                            +---------+---------------+---------+-----------+---------------+-------------+ Gastroc  Full                                                            +---------+---------------+---------+-----------+---------------+-------------+   +----+---------------+---------+-----------+------------------+--------------+ LEFTCompressibilityPhasicitySpontaneityProperties        Thrombus Aging +----+---------------+---------+-----------+------------------+--------------+ CFV Full           Yes      No         Pulsatile waveform               +----+---------------+---------+-----------+------------------+--------------+     Summary: RIGHT: - There is no evidence of deep vein thrombosis in the lower extremity.  - No cystic structure found in the popliteal fossa. Pulsatile waveforms throughout. Interstitial edema noted throughout right lower extremity.  LEFT: - No evidence of common femoral vein obstruction.  Pulsatile waveform.  *See table(s) above for measurements and observations. Electronically signed by Gerarda Fraction on 01/28/2024 at 5:41:41 PM.    Final    CT CHEST ABDOMEN PELVIS WO CONTRAST Result Date: 01/27/2024 CLINICAL DATA:  Sepsis EXAM: CT CHEST, ABDOMEN AND PELVIS WITHOUT CONTRAST TECHNIQUE: Multidetector CT imaging of the chest, abdomen and pelvis was performed following the standard protocol without IV contrast. RADIATION DOSE REDUCTION: This exam was performed according to the departmental dose-optimization program which includes automated exposure control, adjustment of the mA and/or kV according to patient size  and/or use of iterative reconstruction technique. COMPARISON:  Chest x-ray today.  Chest CT  04/17/2015 FINDINGS: CT CHEST FINDINGS Cardiovascular: Heart is borderline in size. Scattered coronary artery and aortic calcifications. No evidence of aortic aneurysm. Mediastinum/Nodes: No mediastinal, hilar, or axillary adenopathy. Trachea and esophagus are unremarkable. Thyroid unremarkable. Lungs/Pleura: Mild centrilobular emphysema. Airspace opacities noted in the left upper lobe and left lower lobe most compatible with pneumonia. Nodular area in the left upper lobe measuring up to 1.6 cm likely also related to the infectious process, but follow-up recommended after treatment to ensure resolution. Mild peribronchial thickening. No confluent opacity on the right. No effusions. Musculoskeletal: Chest wall soft tissues are unremarkable. No acute bony abnormality. CT ABDOMEN PELVIS FINDINGS Hepatobiliary: Low-density lesion in the inferior right hepatic lobe measures up to 4.5 cm and is stable since prior study most compatible with cysts. Prior cholecystectomy. No suspicious focal hepatic abnormality or biliary ductal dilatation. Pancreas: No focal abnormality or ductal dilatation. Spleen: No focal abnormality.  Normal size. Adrenals/Urinary Tract: Adrenal glands normal. 5.5 cm lesion in the upper pole of the left kidney is isodense to the kidney. This is slightly larger than prior study, but most likely reflects a hemorrhagic or proteinaceous cyst. Numerous bilateral low-density lesions in the kidneys are most compatible with cysts. No follow-up imaging recommended. No hydronephrosis. Right lower quadrant urinary diversion. Prior cystectomy. Stomach/Bowel: Normal appendix. Stomach, large and small bowel grossly unremarkable. Small supraumbilical ventral hernia contains the anterior wall of the transverse colon. No bowel obstruction. Vascular/Lymphatic: Aortoiliac atherosclerosis. No evidence of aneurysm or adenopathy.  Reproductive: No visible focal abnormality. Other: No free fluid or free air. Musculoskeletal: Postoperative and degenerative changes in the lumbar spine. No acute bony abnormality. IMPRESSION: Patchy airspace opacity in the left upper lobe and left lower lobe most compatible with pneumonia. Nodular component in the left upper lobe most likely also related to pneumonia, but recommend follow-up CT in 3-6 months after treatment to ensure resolution. Mild emphysema. Coronary artery disease, aortic atherosclerosis. No acute findings in the abdomen or pelvis. Electronically Signed   By: Charlett Nose M.D.   On: 01/27/2024 20:14   DG Chest Port 1 View Result Date: 01/27/2024 CLINICAL DATA:  Near syncope, short of breath EXAM: PORTABLE CHEST 1 VIEW COMPARISON:  06/26/2023 FINDINGS: Single frontal view of the chest demonstrates an enlarged cardiac silhouette. There is patchy left basilar consolidation, increased since prior exam. No effusion or pneumothorax. No acute bony abnormalities. IMPRESSION: 1. Patchy left basilar consolidation, slightly more pronounced than prior study. While this could reflect recurrent aspiration or persistent infection, lack of documented interval clearance may warrant further evaluation with CT. 2. Enlarged cardiac silhouette. Electronically Signed   By: Sharlet Salina M.D.   On: 01/27/2024 16:27    Microbiology: Results for orders placed or performed during the hospital encounter of 02/15/24  Resp panel by RT-PCR (RSV, Flu A&B, Covid) Anterior Nasal Swab     Status: None   Collection Time: 02/15/24  4:43 AM   Specimen: Anterior Nasal Swab  Result Value Ref Range Status   SARS Coronavirus 2 by RT PCR NEGATIVE NEGATIVE Final   Influenza A by PCR NEGATIVE NEGATIVE Final   Influenza B by PCR NEGATIVE NEGATIVE Final    Comment: (NOTE) The Xpert Xpress SARS-CoV-2/FLU/RSV plus assay is intended as an aid in the diagnosis of influenza from Nasopharyngeal swab specimens and should not  be used as a sole basis for treatment. Nasal washings and aspirates are unacceptable for Xpert Xpress SARS-CoV-2/FLU/RSV testing.  Fact Sheet for Patients: BloggerCourse.com  Fact Sheet for Healthcare Providers:  SeriousBroker.it  This test is not yet approved or cleared by the [place] and has been authorized for detection and/or diagnosis of SARS-CoV-2 by FDA under an Emergency Use Authorization (EUA). This EUA will remain in effect (meaning this test can be used) for the duration of the COVID-19 declaration under Section 564(b)(1) of the Act, 21 U.S.C. section 360bbb-3(b)(1), unless the authorization is terminated or revoked.     Resp Syncytial Virus by PCR NEGATIVE NEGATIVE Final    Comment: (NOTE) Fact Sheet for Patients: BloggerCourse.com  Fact Sheet for Healthcare Providers: SeriousBroker.it  This test is not yet approved or cleared by the [place] FDA and has been authorized for detection and/or diagnosis of SARS-CoV-2 by FDA under an Emergency Use Authorization (EUA). This EUA will remain in effect (meaning this test can be used) for the duration of the COVID-19 declaration under Section 564(b)(1) of the Act, 21 U.S.C. section 360bbb-3(b)(1), unless the authorization is terminated or revoked.  Performed at [facility], [address], [place], [place] [address]     Labs: CBC: Recent Labs  Lab 02/15/24 [phone] 02/15/24 1701 02/16/24 0250 02/16/24 0852 02/16/24 1554 02/17/24 0246 02/17/24 1845 02/18/24 0302  WBC 8.5  --  7.0  --   --  4.7  --  6.8  HGB 8.4*   < > 7.9* 8.1* 8.6* 8.0* 8.9* 8.1*  HCT 30.5*   < > 28.6* 28.5* 31.6* 28.5* 31.4* 28.2*  MCV 79.0*  --  77.9*  --   --  76.0*  --  76.8*  PLT 246  --  215  --   --  214  --  208   < > = values in this interval not displayed.   Basic Metabolic Panel: Recent Labs  Lab 02/15/24 0436  02/15/24 1701 02/16/24 0250 02/17/24 0246 02/18/24 0302  NA 144  --  139 142 141  K 4.8  --  5.6* 4.5 4.6  CL 110  --  106 107 106  CO2 26  --  27 28 27   GLUCOSE 74  --  119* 79 151*  BUN 42*  --  39* 33* 33*  CREATININE 2.04*  --  1.85* 1.93* 2.23*  CALCIUM 8.3*  --  8.8* 8.8* 8.5*  MG  --  1.6* 1.6* 2.0  --    Liver Function Tests: Recent Labs  Lab 02/15/24 1701 02/16/24 0250  AST 23 16  ALT 21 19  ALKPHOS 44 43  BILITOT 0.4 0.4  PROT 5.5* 5.2*  ALBUMIN 2.7* 2.6*   CBG: Recent Labs  Lab 02/17/24 1622 02/17/24 2111 02/18/24 0411 02/18/24 0754 02/18/24 1122  GLUCAP 124* 137* 110* 95 79    Discharge time spent: {LESS THAN/GREATER THAN:26388} 30 minutes.  Signed: MDALA-GAUSI, Gwenette Greet, MD [facility] 02/18/2024

## 2024-02-18 NOTE — Progress Notes (Signed)
 [facility] Gastroenterology Progress Note  Jerry [name]. 83 y.o. [birth year]  CC: Anemia, heme positive stool   Subjective: Patient seen and examined at bedside.  Denies any GI issues.  Having brown stools.  ROS : Afebrile, negative for nausea and vomiting   Objective: Vital signs in last 24 hours: Vitals:   02/18/24 0734 02/18/24 0831  BP: (!) 166/83 (!) 145/79  Pulse: 83   Resp: 20   Temp: 98.7 F (37.1 C)   SpO2: 97%     Physical Exam: Elderly patient, sitting comfortably.  Not in acute distress.  Abdominal exam benign.  Alert and oriented x 3.  Mood and affect normal.  Lab Results: Recent Labs    02/16/24 0250 02/17/24 0246 02/18/24 0302  NA 139 142 141  K 5.6* 4.5 4.6  CL 106 107 106  CO2 27 28 27   GLUCOSE 119* 79 151*  BUN 39* 33* 33*  CREATININE 1.85* 1.93* 2.23*  CALCIUM 8.8* 8.8* 8.5*  MG 1.6* 2.0  --    Recent Labs    02/15/24 1701 02/16/24 0250  AST 23 16  ALT 21 19  ALKPHOS 44 43  BILITOT 0.4 0.4  PROT 5.5* 5.2*  ALBUMIN 2.7* 2.6*   Recent Labs    02/17/24 0246 02/17/24 1845 02/18/24 0302  WBC 4.7  --  6.8  HGB 8.0* 8.9* 8.1*  HCT 28.5* 31.4* 28.2*  MCV 76.0*  --  76.8*  PLT 214  --  208   No results for input(s): "LABPROT", "INR" in the last 72 hours.    Assessment/Plan: - Recurrent anemia with heme positive stool. 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  Was found to have a fair prep and internal hemorrhoids. -Newly diagnosed pulmonary embolism. 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  Thought to be supply/demand mismatch.  Recommendations ------------------------ - Patient's  hemoglobin is stable on heparin drip.  No overt bleeding.  Currently having brown-colored stool. - No further inpatient GI workup planned at this time. - Okay to switch him to oral anticoagulation from GI standpoint.  GI will sign off.  Call us back if needed   Kathi Der MD, FACP 02/18/2024, 8:46 AM  Contact #  [phone]

## 2024-02-18 NOTE — Evaluation (Signed)
 Physical Therapy Evaluation Patient Details Name: Jerry [name]. MRN: [id number] DOB: [birth year] Today's Date: 02/18/2024  History of Present Illness  Dymir Neeson. is a 83 y.o. male admitted for SOB, anemia, and anemia/GI bleed. 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 Pt pleasant, cooperative, and able to follow all commands, Pt functioning at contact guard mobility from transfer standpoint. Pt amb with RW and demo'd improved gait kinematics and stability. Pt instructed to use RW at home until his home PT progress' him. Pt agreed. Pt to benefit from continuation of HHPT services  to progrses pt back to amb without AD and minimize fall risk. Acute PT to cont to follow.      If plan is discharge home, recommend the following: A little help with walking and/or transfers;A little help with bathing/dressing/bathroom;Assistance with cooking/housework;Assist for transportation;Help with stairs or ramp for entrance   Can travel by private vehicle        Equipment Recommendations None recommended by PT  Recommendations for Other Services       Functional Status Assessment Patient has had a recent decline in their functional status and demonstrates the ability to make significant improvements in function in a reasonable and predictable amount of time.     Precautions / Restrictions Precautions Precautions: Fall Restrictions Weight Bearing Restrictions Per Provider Order: No      Mobility  Bed Mobility Overal bed mobility: Needs Assistance Bed Mobility: Supine to Sit      Supine to sit: HOB elevated, Supervision     General bed mobility comments: supervision for safety, increased time, no physical assist    Transfers Overall transfer level: Needs assistance Equipment used: Rolling walker (2 wheels) Transfers: Sit to/from Stand, Bed to chair/wheelchair/BSC Sit to Stand: Contact guard assist   Step pivot transfers: Contact guard assist       General transfer comment: CGA for safety    Ambulation/Gait Ambulation/Gait assistance: Contact guard assist Gait Distance (Feet): 180 Feet Assistive device: Rolling walker (2 wheels) Gait Pattern/deviations: Step-through pattern, Trunk flexed, Narrow base of support, Decreased dorsiflexion - right, Decreased dorsiflexion - left, Decreased step length - right, Decreased step length - left Gait velocity: decr Gait velocity interpretation: <1.31 ft/sec, indicative of household ambulator   General Gait Details: short steps, progressively loosing proximity to walker, able to correct with verbal cues. Pts SPO2 at 91% on 2lO2 via [place]  Stairs            Wheelchair Mobility     Tilt Bed    Modified Rankin (Stroke Patients Only)       Balance Overall balance assessment: Needs assistance Sitting-balance support: No upper extremity supported, Feet supported Sitting balance-Leahy Scale: Good     Standing balance support: Bilateral upper extremity supported, During functional activity, Reliant on assistive device for balance Standing balance-Leahy Scale: Poor Standing balance comment: reliant on RW, can release at sink in static standing                             Pertinent Vitals/Pain Pain Assessment Pain Assessment: No/denies pain Faces Pain Scale: No hurt  Home Living Family/patient expects to be discharged to:: Private residence Living Arrangements: Spouse/significant other Available Help at Discharge: Available 24 hours/day Type of Home: House Home Access: Stairs to  enter Entrance Stairs-Rails: Right Entrance Stairs-Number of Steps: 1   Home Layout: One level Home Equipment: Grab bars - toilet;Shower seat;Grab bars - Chartered loss adjuster (2 wheels) Additional Comments: Has HHOT HHPT, and nurse come out currently    Prior Function Prior Level of Function : Independent/Modified Independent;Driving             Mobility Comments: walks with a RW ADLs Comments: independent in self care, no family available to confirm     Extremity/Trunk Assessment   Upper Extremity Assessment Upper Extremity Assessment: Overall WFL for tasks assessed    Lower Extremity Assessment Lower Extremity Assessment: Generalized weakness    Cervical / Trunk Assessment Cervical / Trunk Assessment: Kyphotic  Communication   Communication Communication: No apparent difficulties    Cognition Arousal: Alert     PT - Cognitive impairments: History of cognitive impairments                         Following commands: Intact Following commands impaired: Follows one step commands with increased time     Cueing Cueing Techniques: Verbal cues, Tactile cues     General Comments      Exercises     Assessment/Plan    PT Assessment Patient needs continued PT services  PT Problem List Decreased strength;Decreased activity tolerance;Decreased balance;Decreased mobility;Cardiopulmonary status limiting activity       PT Treatment Interventions DME instruction;Gait training;Stair training;Functional mobility training;Therapeutic activities;Therapeutic exercise;Balance training;Neuromuscular re-education;Patient/family education    PT Goals (Current goals can be found in the Care Plan section)  Acute Rehab PT Goals Patient Stated Goal: to get better PT Goal Formulation: With patient Time For Goal Achievement: 03/03/24 Potential to Achieve Goals: Good    Frequency Min 2X/week     Co-evaluation               AM-PAC PT "6 Clicks"  Mobility  Outcome Measure Help needed turning from your back to your side while in a flat bed without using bedrails?: A Little Help needed moving from lying on your back to sitting on the side of a flat bed without using bedrails?: A Little Help needed moving to and from a bed to a chair (including a wheelchair)?: A Little Help needed standing up from a chair using your arms (e.g., wheelchair or bedside chair)?: A Little Help needed to walk in hospital room?: A Little Help needed climbing 3-5 steps with a railing? : A Lot 6 Click Score: 17    End of Session Equipment Utilized During Treatment: Gait belt;Oxygen Activity Tolerance: Patient tolerated treatment well Patient left: with call bell/phone within reach;in chair;with chair alarm set Nurse Communication: Mobility status PT Visit Diagnosis: Unsteadiness on feet (R26.81);Other abnormalities of gait and mobility (R26.89);Muscle weakness (generalized) (M62.81)    Time: 1478-2956 PT Time Calculation (min) (ACUTE ONLY): 25 min   Charges:   PT Evaluation $PT Eval Moderate Complexity: 1 Mod PT Treatments $Gait Training: 8-22 mins PT General Charges $$ ACUTE PT VISIT: 1 Visit         Lewis Shock, PT, DPT Acute Rehabilitation Services Secure chat preferred Office #: [phone]   Iona Hansen 02/18/2024, 1:05 PM

## 2024-02-18 NOTE — Evaluation (Signed)
 Occupational Therapy Evaluation Patient Details Name: Jerry [name]. MRN: [id number] DOB: 21-Dec-1940 Today's Date: 02/18/2024   History of Present Illness   Jerry Mall. is a 83 y.o. male admitted for SOB, anemia, and anemia/GI bleed. 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 PTA pt lives with his wife and was Ind with ADLs/selfcare, used cane or RW for mobility and was driving. Pt currently requires Sup with bed mobility, min A with LB ADLs, min A with toileting and CGA/min A with mobility/transfers using RW. Pt with LOB x 1 when standing to pull up pants, and was safely assisted to sitting back onto bed. Pt would benefit from acute OT services to address impairments to maximize level of function and safety     If plan is discharge home, recommend the following:   A little help with walking and/or transfers;A lot of help with bathing/dressing/bathroom;Assistance with cooking/housework;Direct supervision/assist for medications management;Direct supervision/assist for financial management;Assist for transportation;Help with stairs or ramp for entrance     Functional Status Assessment   Patient has had a recent decline in their functional status and demonstrates the ability to make significant improvements in function in a reasonable and predictable amount of time.     Equipment Recommendations   None recommended by OT     Recommendations for Other Services          Precautions/Restrictions   Precautions Precautions: Fall Restrictions Weight Bearing Restrictions Per Provider Order: No     Mobility Bed Mobility Overal bed mobility: Needs Assistance Bed Mobility: Supine to Sit, Sit to Supine     Supine to sit: HOB elevated, Supervision Sit to supine: Supervision, HOB elevated   General bed mobility comments: supervision for safety, increased time, no physical assist    Transfers Overall transfer level: Needs assistance Equipment used: Rolling walker (2 wheels) Transfers: Sit to/from Stand, Bed to chair/wheelchair/BSC Sit to Stand: Min assist     Step pivot transfers: Contact guard assist     General transfer comment: CGA for safety      Balance Overall balance assessment: Needs assistance Sitting-balance support: No upper extremity supported, Feet supported Sitting balance-Leahy Scale: Good     Standing balance support: Bilateral upper extremity supported, During functional activity, Reliant on assistive device for balance Standing balance-Leahy Scale: Poor                             ADL either performed or assessed with clinical judgement   ADL Overall ADL's : Needs assistance/impaired Eating/Feeding: Independent;Sitting   Grooming: Wash/dry face;Oral care;Wash/dry hands;Contact guard assist;Standing   Upper Body Bathing: Set up;Sitting   Lower Body Bathing: Minimal assistance;Sit to/from stand Lower Body Bathing Details (indicate cue type and reason): simulated Upper Body Dressing : Set up;Sitting   Lower Body Dressing: Minimal assistance;Sit to/from stand Lower Body Dressing Details (indicate cue type and reason): LOB pulling up pants, sat back onto bed Toilet Transfer: Contact guard assist;Ambulation;Rolling walker (2 wheels);Regular Toilet;Grab bars;Cueing for safety   Toileting- Clothing Manipulation  and Hygiene: Minimal assistance;Sit to/from stand       Functional mobility during ADLs:  Minimal assistance;Contact guard assist;+2 for safety/equipment;Cueing for safety       Vision Baseline Vision/History: 1 Wears glasses Ability to See in Adequate Light: 0 Adequate Patient Visual Report: No change from baseline       Perception         Praxis         Pertinent Vitals/Pain Pain Assessment Pain Assessment: No/denies pain Faces Pain Scale: No hurt Pain Intervention(s): Monitored during session, Repositioned     Extremity/Trunk Assessment Upper Extremity Assessment Upper Extremity Assessment: Generalized weakness   Lower Extremity Assessment Lower Extremity Assessment: Defer to PT evaluation   Cervical / Trunk Assessment Cervical / Trunk Assessment: Kyphotic   Communication Communication Communication: No apparent difficulties   Cognition Arousal: Alert Behavior During Therapy: WFL for tasks assessed/performed                                 Following commands: Intact Following commands impaired: Follows one step commands with increased time     Cueing  General Comments   Cueing Techniques: Verbal cues;Tactile cues      Exercises     Shoulder Instructions      Home Living Family/patient expects to be discharged to:: Private residence Living Arrangements: Spouse/significant other Available Help at Discharge: Available 24 hours/day Type of Home: House Home Access: Stairs to enter [employer] of Steps: 1 Entrance Stairs-Rails: Right Home Layout: One level     Bathroom Shower/Tub: Producer, television/film/video: Handicapped height     Home Equipment: Grab bars - toilet;Shower seat;Grab bars - Chartered loss adjuster (2 wheels)   Additional Comments: Has HHOT HHPT, and nurse come out currently      Prior Functioning/Environment Prior Level of Function : Independent/Modified Independent;Driving             Mobility Comments: walks with a RW ADLs Comments: Ind with ADLs/selcare    OT Problem  List: Decreased strength;Impaired balance (sitting and/or standing);Decreased activity tolerance;Decreased cognition;Decreased safety awareness;Decreased knowledge of use of DME or AE   OT Treatment/Interventions: Self-care/ADL training;DME and/or AE instruction;Therapeutic activities;Patient/family education;Balance training      OT Goals(Current goals can be found in the care plan section)   Acute Rehab OT Goals Patient Stated Goal: go home OT Goal Formulation: With patient Time For Goal Achievement: 03/03/24 Potential to Achieve Goals: Good ADL Goals Pt Will Perform Grooming: with supervision;standing Pt Will Perform Lower Body Bathing: with contact guard assist;with supervision;sit to/from stand Pt Will Perform Lower Body Dressing: with contact guard assist;with supervision;sit to/from stand Pt Will Transfer to Toilet: with supervision;ambulating;regular height toilet;grab bars Pt Will Perform Toileting - Clothing Manipulation and hygiene: with contact guard assist;with supervision;sit to/from stand Pt Will Perform Tub/Shower Transfer: with contact guard assist;with supervision;ambulating;rolling walker;shower seat;grab bars   OT Frequency:  Min 2X/week    Co-evaluation              AM-PAC OT "6 Clicks" Daily Activity     Outcome Measure Help from another person eating meals?: None Help from another person taking care of personal grooming?: A Little Help from another person toileting, which includes using toliet, bedpan, or urinal?: A Little Help from another person bathing (including washing, rinsing, drying)?: A Little Help from another person to put on and taking off regular upper body clothing?: A Little Help from  another person to put on and taking off regular lower body clothing?: A Little 6 Click Score: 19   End of Session Equipment Utilized During Treatment: Rolling walker (2 wheels);Gait belt  Activity Tolerance: Patient tolerated treatment well Patient left:  with call bell/phone within reach;in bed;with bed alarm set  OT Visit Diagnosis: Unsteadiness on feet (R26.81);Other abnormalities of gait and mobility (R26.89);Muscle weakness (generalized) (M62.81);Other symptoms and signs involving cognitive function;Other (comment)                Time: 1610-9604 OT Time Calculation (min): 22 min Charges:  OT Evaluation $OT Eval Low Complexity: 1 Low    Galen Manila 02/18/2024, 2:13 PM

## 2024-02-18 NOTE — TOC Transition Note (Addendum)
 Transition of Care [facility]) - Discharge Note   Patient Details  Name: Jerry [name]. MRN: [id number] Date of Birth: 03/16/1941  [facility] of [facility] CM/SW Contact:  Leone Haven, RN Phone Number: 02/18/2024, 2:27 PM   Clinical Narrative:    For d today he is already active with [facility] will need resumption orders.  Wife will bring the oxygen tank to transport him home with.  NCM informed patient of his copay of 85.29 for eliquis for refills.     Final next level of care: Home w Home Health Services Barriers to Discharge: No Barriers Identified   Patient Goals and CMS Choice Patient states their goals for this hospitalization and ongoing recovery are:: return home with [facility] CMS Medicare.gov Compare Post Acute Care list provided to:: Patient Choice offered to / list presented to : Patient      Discharge Placement                       Discharge Plan and Services Additional resources added to the After Visit Summary for     Discharge Planning Services: CM Consult Post Acute Care Choice: Home Health          DME Arranged: N/A         HH Arranged: RN, PT HH Agency: [facility] Date [facility] Agency Contacted: 02/18/24 Time HH Agency Contacted: 1422 Representative spoke with at [facility] Agency: Kandee Keen  Social Drivers of Health (SDOH) Interventions SDOH Screenings   Food Insecurity: No Food Insecurity (02/15/2024)  Housing: Low Risk  (02/15/2024)  Transportation Needs: No Transportation Needs (02/15/2024)  Utilities: Not At Risk (02/15/2024)  Social Connections: Socially Integrated (02/15/2024)  Tobacco Use: Low Risk  (02/15/2024)     Readmission Risk Interventions    02/18/2024    2:20 PM 06/27/2023   12:12 PM  Readmission Risk Prevention Plan  Transportation Screening Complete Complete  PCP or Specialist Appt within 5-7 Days  Complete  PCP or Specialist Appt within 3-5 Days Complete   Home Care Screening  Complete  Medication Review (RN CM)  Complete  HRI  or Home Care Consult Complete   Palliative Care Screening Not Applicable   Medication Review (RN Care Manager) Complete

## 2024-02-18 NOTE — Discharge Summary (Incomplete)
 Physician Discharge Summary   Patient: Jerry [name]. MRN: [id number] DOB: [birth year]  Admit date:     02/15/2024  Discharge date: {dischdate:26783}  Discharge Physician: MDALA-GAUSI, Jerry [name]   PCP: Jerry Devon, MD   Recommendations at discharge:  {Tip this will not be part of the note when signed- Example include specific recommendations for outpatient follow-up, pending tests to follow-up on. (Optional):26781}  ***  Discharge Diagnoses: Principal Problem:   SOB (shortness of breath) Active Problems:   Acute pulmonary embolism (HCC)  Resolved Problems:   * No resolved hospital problems. [facility] Course: No notes on file  Assessment and Plan: No notes have been filed under this hospital service. Service: Hospitalist     {Tip this will not be part of the note when signed Body mass index is 23.58 kg/m. , ,  (Optional):26781}  {(NOTE) Pain control PDMP Statment (Optional):26782} Consultants: *** Procedures performed: ***  Disposition: {Plan; Disposition:26390} Diet recommendation:  Discharge Diet Orders (From admission, onward)     Start     Ordered   02/18/24 0000  Diet - low sodium heart healthy        02/18/24 1335           {Diet_Plan:26776} DISCHARGE MEDICATION: Allergies as of 02/18/2024   No Known Allergies      Medication List     STOP taking these medications    predniSONE 10 MG tablet Commonly known as: DELTASONE       TAKE these medications    albuterol 108 (90 Base) MCG/ACT inhaler Commonly known as: VENTOLIN HFA Inhale 2 puffs into the lungs every 6 (six) hours as needed for wheezing or shortness of breath.   ALPRAZolam 0.5 MG tablet Commonly known as: XANAX Take 0.5 mg by mouth 2 (two) times daily as needed for anxiety.   Apixaban Starter Pack (10mg  and 5mg ) Commonly known as: ELIQUIS STARTER PACK Take as directed on package: start with two-5mg  tablets twice daily for 7 days. On day 8, switch to one-5mg  tablet  twice daily.   apixaban 5 MG Tabs tablet Commonly known as: ELIQUIS Take 1 tablet (5 mg total) by mouth 2 (two) times daily. Start taking on: Mar 19, 2024   aspirin EC 81 MG tablet Take 1 tablet (81 mg total) by mouth daily. Swallow whole.   atorvastatin 20 MG tablet Commonly known as: LIPITOR Take 1 tablet (20 mg total) by mouth daily.   Cholecalciferol 25 MCG (1000 UT) tablet Take 1,000 Units by mouth daily.   Dulera 200-5 MCG/ACT Aero Generic drug: mometasone-formoterol Inhale 2 puffs into the lungs 2 (two) times daily.   escitalopram 5 MG tablet Commonly known as: LEXAPRO Take 1 tablet (5 mg total) by mouth daily.   Folivane-Plus Caps Take 1 capsule by mouth daily.   furosemide 40 MG tablet Commonly known as: LASIX Take 0.5 tablets (20 mg total) by mouth daily.   ipratropium-albuterol 0.5-2.5 (3) MG/3ML Soln Commonly known as: DUONEB Inhale 3 mLs into the lungs every 2 (two) hours as needed (SOB/Wheezing).   metoprolol tartrate 50 MG tablet Commonly known as: LOPRESSOR Take 1 tablet (50 mg total) by mouth 2 (two) times daily.   OLANZapine 2.5 MG tablet Commonly known as: ZyPREXA Take 1 tablet (2.5 mg total) by mouth 2 (two) times daily as needed (agitation).   pantoprazole 40 MG tablet Commonly known as: Protonix Take 1 tablet (40 mg total) by mouth 2 (two) times daily.   VITAMIN B12 PO Take 1  tablet by mouth daily.        Follow-up Information     Jerry Devon, MD Follow up.   Specialty: Internal Medicine Why: April 9th @11 :30 Contact information: [address] [place] [address] [phone]                Discharge Exam: [name] Weights   02/16/24 0500 02/17/24 0351 02/18/24 0411  Weight: 77.9 kg 77.2 kg 76.7 kg   ***  Condition at discharge: {DC Condition:26389}  The results of significant diagnostics from this hospitalization (including imaging, microbiology, ancillary and laboratory) are listed below for  reference.   Imaging Studies: VAS [place] LOWER EXTREMITY VENOUS (DVT) Result Date: 02/17/2024  Lower Venous DVT Study Patient Name:  Jerry [name].  Date of Exam:   02/17/2024 Medical Rec #: [id number]          Accession #:    [id number] Date of Birth: May 13, 1941           Patient Gender: M Patient Age:   83 years Exam Location:  [facility] Procedure:      VAS [place] LOWER EXTREMITY VENOUS (DVT) Referring Phys: Osvaldo Shipper --------------------------------------------------------------------------------  Indications: SOB, and pulmonary embolism.  Comparison Study: Previous exam on 01/27/2024 ([facility]LEV) was negative for DVT Performing Technologist: Ernestene Mention RVT, RDMS  Examination Guidelines: A complete evaluation includes B-mode imaging, spectral Doppler, color Doppler, and power Doppler as needed of all accessible portions of each vessel. Bilateral testing is considered an integral part of a complete examination. Limited examinations for reoccurring indications may be performed as noted. The reflux portion of the exam is performed with the patient in reverse Trendelenburg.  +---------+---------------+---------+-----------+----------+--------------+ RIGHT    CompressibilityPhasicitySpontaneityPropertiesThrombus Aging +---------+---------------+---------+-----------+----------+--------------+ CFV      Full           Yes      Yes                                 +---------+---------------+---------+-----------+----------+--------------+ SFJ      Full                                                        +---------+---------------+---------+-----------+----------+--------------+ FV Prox  Full           Yes      Yes                                 +---------+---------------+---------+-----------+----------+--------------+ FV Mid   Full           Yes      Yes                                 +---------+---------------+---------+-----------+----------+--------------+ FV DistalFull            Yes      Yes                                 +---------+---------------+---------+-----------+----------+--------------+ PFV      Full                                                        +---------+---------------+---------+-----------+----------+--------------+  POP      Full           Yes      Yes                                 +---------+---------------+---------+-----------+----------+--------------+ PTV      Full                                                        +---------+---------------+---------+-----------+----------+--------------+ PERO     Full                                                        +---------+---------------+---------+-----------+----------+--------------+   +---------+---------------+---------+-----------+----------+--------------+ LEFT     CompressibilityPhasicitySpontaneityPropertiesThrombus Aging +---------+---------------+---------+-----------+----------+--------------+ CFV      Full           Yes      Yes                                 +---------+---------------+---------+-----------+----------+--------------+ SFJ      Full                                                        +---------+---------------+---------+-----------+----------+--------------+ FV Prox  Full           Yes      Yes                                 +---------+---------------+---------+-----------+----------+--------------+ FV Mid   Full           Yes      Yes                                 +---------+---------------+---------+-----------+----------+--------------+ FV DistalFull           Yes      Yes                                 +---------+---------------+---------+-----------+----------+--------------+ PFV      Full                                                        +---------+---------------+---------+-----------+----------+--------------+ POP      Partial        Yes      Yes                  Acute           +---------+---------------+---------+-----------+----------+--------------+ PTV      Full                                                        +---------+---------------+---------+-----------+----------+--------------+  PERO     Full                                                        +---------+---------------+---------+-----------+----------+--------------+     Summary: BILATERAL: -No evidence of popliteal cyst, bilaterally. RIGHT: - There is no evidence of deep vein thrombosis in the lower extremity.  LEFT: - Findings consistent with acute deep vein thrombosis involving the left popliteal vein.   *See table(s) above for measurements and observations. Electronically signed by Coral Else MD on 02/17/2024 at 1:28:34 PM.    Final    CT Angio Chest PE W/Cm &/Or Wo Cm Result Date: 02/15/2024 CLINICAL DATA:  Shortness of breath EXAM: CT ANGIOGRAPHY CHEST WITH CONTRAST TECHNIQUE: Multidetector CT imaging of the chest was performed using the standard protocol during bolus administration of intravenous contrast. Multiplanar CT image reconstructions and MIPs were obtained to evaluate the vascular anatomy. RADIATION DOSE REDUCTION: This exam was performed according to the departmental dose-optimization program which includes automated exposure control, adjustment of the mA and/or kV according to patient size and/or use of iterative reconstruction technique. CONTRAST:  60mL OMNIPAQUE IOHEXOL 350 MG/ML SOLN COMPARISON:  X-ray earlier 02/15/2024. CT without contrast 01/27/2024 FINDINGS: Cardiovascular: There is enlarged heart. Coronary calcifications are seen. Small pericardial effusion. Thoracic aorta is normal course and caliber with partially calcified atherosclerotic plaque. There is a bovine type aortic arch, normal variant. Filling defects seen along branches of the pulmonary artery in the right lower lobe, segmental. Some areas also suggested in the left lower lobe consistent with pulmonary  emboli. Mild overall clot burden. No reflux of contrast into the intrahepatic IVC. Mediastinum/Nodes: Preserved thyroid gland. The thoracic esophagus is normal course and caliber. No specific abnormal lymph node enlargement seen in the axillary region. Some small less than 1 cm size mediastinal nodes are seen, not pathologic by size criteria. Slightly larger right hilar node identified measuring 18 by 13 mm on series 7, image 202. of note there is also shift of the mediastinum from right to left with left lung volume loss. Lungs/Pleura: Centrilobular emphysematous changes identified. Once again there is also lower lobe areas of bronchiectasis with mucous plugging. Parenchymal opacities identified as well along both lower lobes with small amount of along the dependent lingula. These areas are similar to previous. Opacity in the more mid upper left upper lobe have shown improvement. Opacities in the right upper lobe are also overall less in amount but there are some new areas more posteriorly in the right upper lobe on series 8, image 69. 5 mm nodule seen along the course of the minor fissure, stable. Diffuse breathing motion. No pneumothorax. Upper Abdomen: Complex upper pole left-sided renal cyst identified as on prior. Please correlate prior abdomen pelvis CT. Preserved adrenal glands. Prior cholecystectomy Musculoskeletal: Curvature and degenerative changes seen along the spine. Review of the MIP images confirms the above findings. Critical Value/emergent results were called by telephone at the time of interpretation on 02/15/2024 at 10:55 am to provider Dr. Rubin Payor, who verbally acknowledged these results. IMPRESSION: Bilateral lower lobe segmental pulmonary emboli.  Mild clot burden. Slightly improving opacities in both upper lobes with a subtle new area of mild ground-glass opacity right upper lobe. Areas of opacity in both both lower lobes are similar to the prior CT scan. Volume loss and shift  of the  mediastinum from right to left. Complex left-sided renal cystic foci. Please correlate with prior abdomen pelvis CT Aortic Atherosclerosis (ICD10-I70.0) and Emphysema (ICD10-J43.9). Electronically Signed   By: Karen Kays M.D.   On: 02/15/2024 10:56   DG Chest 2 View Result Date: 02/15/2024 CLINICAL DATA:  Shortness of breath. EXAM: CHEST - 2 VIEW COMPARISON:  02/03/2024 FINDINGS: Subtle retrocardiac opacity is similar to prior compatible with atelectasis or infiltrate. Tiny left pleural effusion. No dense consolidation in the right lung. No pneumothorax. Cardiopericardial silhouette is at upper limits of normal for size. No acute bony abnormality. Degenerative changes are noted in the right shoulder. IMPRESSION: 1. No substantial change in exam. 2. Subtle retrocardiac opacity compatible with atelectasis or infiltrate. 3. Tiny left pleural effusion. Electronically Signed   By: Kennith Center M.D.   On: 02/15/2024 05:17   DG Chest Port 1 View Result Date: 02/03/2024 CLINICAL DATA:  Pneumonia. EXAM: PORTABLE CHEST 1 VIEW COMPARISON:  01/31/2024 and CT chest 01/27/2024. FINDINGS: Patient is slightly rotated. Trachea is midline. Heart is enlarged, stable. Mild bibasilar streaky opacities, left greater than right. Small left pleural effusion. IMPRESSION: 1. Patchy bibasilar atelectasis, left greater than right. Difficult to exclude residual pneumonia when compared with 01/27/2024. 2. Small left pleural effusion. Electronically Signed   By: Leanna Battles M.D.   On: 02/03/2024 12:09   DG Chest Port 1 View Result Date: 01/31/2024 CLINICAL DATA:  Shortness of breath. EXAM: PORTABLE CHEST 1 VIEW COMPARISON:  January 27, 2024. FINDINGS: Stable cardiomediastinal silhouette. Right lung is clear. Mild left basilar atelectasis or infiltrate is noted with possible small pleural effusion. Bony thorax is unremarkable. IMPRESSION: Mild left basilar atelectasis or infiltrate is noted with small left pleural effusion.  Electronically Signed   By: Lupita Raider M.D.   On: 01/31/2024 15:00   CARDIAC CATHETERIZATION Result Date: 01/29/2024   Prox RCA lesion is 25% stenosed.   LV end diastolic pressure is mildly elevated.   Hemodynamic findings consistent with moderate pulmonary hypertension. Minimal nonobstructive CAD Mildly elevated LV filling pressures. LVEDP 19 mm Hg. PCWP 24/27 with mean 19 mm Hg Moderate pulmonary HTN. PAP 65/30 with mean 40 mm Hg Cardiac output 5.49 L/min, index 2.83 Plan: medical management. Type 2 NSTEMI   PERIPHERAL VASCULAR CATHETERIZATION Result Date: 01/29/2024   Prox RCA lesion is 25% stenosed.   LV end diastolic pressure is mildly elevated.   Hemodynamic findings consistent with moderate pulmonary hypertension. Minimal nonobstructive CAD Mildly elevated LV filling pressures. LVEDP 19 mm Hg. PCWP 24/27 with mean 19 mm Hg Moderate pulmonary HTN. PAP 65/30 with mean 40 mm Hg Cardiac output 5.49 L/min, index 2.83 Plan: medical management. Type 2 NSTEMI   VAS [place] LOWER EXTREMITY VENOUS (DVT) (7a-7p) Result Date: 01/28/2024  Lower Venous DVT Study Patient Name:  Jervis Trapani.  Date of Exam:   01/27/2024 Medical Rec #: [id number]          Accession #:    [id number] Date of Birth: 04/27/1941           Patient Gender: M Patient Age:   34 years Exam Location:  [facility] Procedure:      VAS [place] LOWER EXTREMITY VENOUS (DVT) Referring Phys: Gwyneth Sprout --------------------------------------------------------------------------------  Indications: Edema, Syncope, and SOB.  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 Comparison Study: No prior study on file Performing Technologist: Sherren Kerns RVS  Examination Guidelines: A complete evaluation  includes B-mode imaging, spectral Doppler, color Doppler, and power Doppler as needed of all accessible portions of each vessel. Bilateral testing is considered an  integral part of a complete examination. Limited examinations for reoccurring indications may be performed as noted. The reflux portion of the exam is performed with the patient in reverse Trendelenburg.  +---------+---------------+---------+-----------+---------------+-------------+ RIGHT    CompressibilityPhasicitySpontaneityProperties     Thrombus                                                                 Aging         +---------+---------------+---------+-----------+---------------+-------------+ CFV      Full           Yes      Yes        Pulsatile                                                                waveform                     +---------+---------------+---------+-----------+---------------+-------------+ SFJ      Full                                                            +---------+---------------+---------+-----------+---------------+-------------+ FV Prox  Full                                                            +---------+---------------+---------+-----------+---------------+-------------+ FV Mid   Full                                                            +---------+---------------+---------+-----------+---------------+-------------+ FV DistalFull                                                            +---------+---------------+---------+-----------+---------------+-------------+ PFV      Full                                                            +---------+---------------+---------+-----------+---------------+-------------+ POP      Full  Yes      No         Pulsatile                                                                waveform                     +---------+---------------+---------+-----------+---------------+-------------+ PTV      Full                                                             +---------+---------------+---------+-----------+---------------+-------------+ PERO     Full                                                            +---------+---------------+---------+-----------+---------------+-------------+ Gastroc  Full                                                            +---------+---------------+---------+-----------+---------------+-------------+   +----+---------------+---------+-----------+------------------+--------------+ LEFTCompressibilityPhasicitySpontaneityProperties        Thrombus Aging +----+---------------+---------+-----------+------------------+--------------+ CFV Full           Yes      No         Pulsatile waveform               +----+---------------+---------+-----------+------------------+--------------+     Summary: RIGHT: - There is no evidence of deep vein thrombosis in the lower extremity.  - No cystic structure found in the popliteal fossa. Pulsatile waveforms throughout. Interstitial edema noted throughout right lower extremity.  LEFT: - No evidence of common femoral vein obstruction.  Pulsatile waveform.  *See table(s) above for measurements and observations. Electronically signed by Gerarda Fraction on 01/28/2024 at 5:41:41 PM.    Final    CT CHEST ABDOMEN PELVIS WO CONTRAST Result Date: 01/27/2024 CLINICAL DATA:  Sepsis EXAM: CT CHEST, ABDOMEN AND PELVIS WITHOUT CONTRAST TECHNIQUE: Multidetector CT imaging of the chest, abdomen and pelvis was performed following the standard protocol without IV contrast. RADIATION DOSE REDUCTION: This exam was performed according to the departmental dose-optimization program which includes automated exposure control, adjustment of the mA and/or kV according to patient size and/or use of iterative reconstruction technique. COMPARISON:  Chest x-ray today.  Chest CT 04/17/2015 FINDINGS: CT CHEST FINDINGS Cardiovascular: Heart is borderline in size. Scattered coronary artery and aortic  calcifications. No evidence of aortic aneurysm. Mediastinum/Nodes: No mediastinal, hilar, or axillary adenopathy. Trachea and esophagus are unremarkable. Thyroid unremarkable. Lungs/Pleura: Mild centrilobular emphysema. Airspace opacities noted in the left upper lobe and left lower lobe most compatible with pneumonia. Nodular area in the left upper lobe measuring up to 1.6 cm likely also related to the infectious process, but follow-up recommended after treatment to ensure resolution. Mild  peribronchial thickening. No confluent opacity on the right. No effusions. Musculoskeletal: Chest wall soft tissues are unremarkable. No acute bony abnormality. CT ABDOMEN PELVIS FINDINGS Hepatobiliary: Low-density lesion in the inferior right hepatic lobe measures up to 4.5 cm and is stable since prior study most compatible with cysts. Prior cholecystectomy. No suspicious focal hepatic abnormality or biliary ductal dilatation. Pancreas: No focal abnormality or ductal dilatation. Spleen: No focal abnormality.  Normal size. Adrenals/Urinary Tract: Adrenal glands normal. 5.5 cm lesion in the upper pole of the left kidney is isodense to the kidney. This is slightly larger than prior study, but most likely reflects a hemorrhagic or proteinaceous cyst. Numerous bilateral low-density lesions in the kidneys are most compatible with cysts. No follow-up imaging recommended. No hydronephrosis. Right lower quadrant urinary diversion. Prior cystectomy. Stomach/Bowel: Normal appendix. Stomach, large and small bowel grossly unremarkable. Small supraumbilical ventral hernia contains the anterior wall of the transverse colon. No bowel obstruction. Vascular/Lymphatic: Aortoiliac atherosclerosis. No evidence of aneurysm or adenopathy. Reproductive: No visible focal abnormality. Other: No free fluid or free air. Musculoskeletal: Postoperative and degenerative changes in the lumbar spine. No acute bony abnormality. IMPRESSION: Patchy airspace opacity  in the left upper lobe and left lower lobe most compatible with pneumonia. Nodular component in the left upper lobe most likely also related to pneumonia, but recommend follow-up CT in 3-6 months after treatment to ensure resolution. Mild emphysema. Coronary artery disease, aortic atherosclerosis. No acute findings in the abdomen or pelvis. Electronically Signed   By: Charlett Nose M.D.   On: 01/27/2024 20:14   DG Chest Port 1 View Result Date: 01/27/2024 CLINICAL DATA:  Near syncope, short of breath EXAM: PORTABLE CHEST 1 VIEW COMPARISON:  06/26/2023 FINDINGS: Single frontal view of the chest demonstrates an enlarged cardiac silhouette. There is patchy left basilar consolidation, increased since prior exam. No effusion or pneumothorax. No acute bony abnormalities. IMPRESSION: 1. Patchy left basilar consolidation, slightly more pronounced than prior study. While this could reflect recurrent aspiration or persistent infection, lack of documented interval clearance may warrant further evaluation with CT. 2. Enlarged cardiac silhouette. Electronically Signed   By: Sharlet Salina M.D.   On: 01/27/2024 16:27    Microbiology: Results for orders placed or performed during the hospital encounter of 02/15/24  Resp panel by RT-PCR (RSV, Flu A&B, Covid) Anterior Nasal Swab     Status: None   Collection Time: 02/15/24  4:43 AM   Specimen: Anterior Nasal Swab  Result Value Ref Range Status   SARS Coronavirus 2 by RT PCR NEGATIVE NEGATIVE Final   Influenza A by PCR NEGATIVE NEGATIVE Final   Influenza B by PCR NEGATIVE NEGATIVE Final    Comment: (NOTE) The Xpert Xpress SARS-CoV-2/FLU/RSV plus assay is intended as an aid in the diagnosis of influenza from Nasopharyngeal swab specimens and should not be used as a sole basis for treatment. Nasal washings and aspirates are unacceptable for Xpert Xpress SARS-CoV-2/FLU/RSV testing.  Fact Sheet for Patients: BloggerCourse.com  Fact Sheet  for Healthcare Providers: SeriousBroker.it  This test is not yet approved or cleared by the [place] FDA and has been authorized for detection and/or diagnosis of SARS-CoV-2 by FDA under an Emergency Use Authorization (EUA). This EUA will remain in effect (meaning this test can be used) for the duration of the COVID-19 declaration under Section 564(b)(1) of the Act, 21 U.S.C. section 360bbb-3(b)(1), unless the authorization is terminated or revoked.     Resp Syncytial Virus by PCR NEGATIVE NEGATIVE Final  Comment: (NOTE) Fact Sheet for Patients: BloggerCourse.com  Fact Sheet for Healthcare Providers: SeriousBroker.it  This test is not yet approved or cleared by the [place] FDA and has been authorized for detection and/or diagnosis of SARS-CoV-2 by FDA under an Emergency Use Authorization (EUA). This EUA will remain in effect (meaning this test can be used) for the duration of the COVID-19 declaration under Section 564(b)(1) of the Act, 21 U.S.C. section 360bbb-3(b)(1), unless the authorization is terminated or revoked.  Performed at [facility] [facility], [address], [place], [place] [address]     Labs: CBC: Recent Labs  Lab 02/15/24 [phone] 02/15/24 1701 02/16/24 0250 02/16/24 0852 02/16/24 1554 02/17/24 0246 02/17/24 1845 02/18/24 0302  WBC 8.5  --  7.0  --   --  4.7  --  6.8  HGB 8.4*   < > 7.9* 8.1* 8.6* 8.0* 8.9* 8.1*  HCT 30.5*   < > 28.6* 28.5* 31.6* 28.5* 31.4* 28.2*  MCV 79.0*  --  77.9*  --   --  76.0*  --  76.8*  PLT 246  --  215  --   --  214  --  208   < > = values in this interval not displayed.   Basic Metabolic Panel: Recent Labs  Lab 02/15/24 0436 02/15/24 1701 02/16/24 0250 02/17/24 0246 02/18/24 0302  NA 144  --  139 142 141  K 4.8  --  5.6* 4.5 4.6  CL 110  --  106 107 106  CO2 26  --  27 28 27   GLUCOSE 74  --  119* 79 151*  BUN 42*  --  39* 33* 33*   CREATININE 2.04*  --  1.85* 1.93* 2.23*  CALCIUM 8.3*  --  8.8* 8.8* 8.5*  MG  --  1.6* 1.6* 2.0  --    Liver Function Tests: Recent Labs  Lab 02/15/24 1701 02/16/24 0250  AST 23 16  ALT 21 19  ALKPHOS 44 43  BILITOT 0.4 0.4  PROT 5.5* 5.2*  ALBUMIN 2.7* 2.6*   CBG: Recent Labs  Lab 02/17/24 1622 02/17/24 2111 02/18/24 0411 02/18/24 0754 02/18/24 1122  GLUCAP 124* 137* 110* 95 79    Discharge time spent: {LESS THAN/GREATER THAN:26388} 30 minutes.  Signed: MDALA-GAUSI, Jerry Greet, MD [facility] 02/18/2024

## 2024-02-18 NOTE — Telephone Encounter (Signed)
 Patient Product/process development scientist completed.    The patient is insured through [employer]. Patient has Medicare and is not eligible for a copay card, but may be able to apply for patient assistance or Medicare RX Payment Plan (Patient Must reach out to their plan, if eligible for payment plan), if available.    Ran test claim for Eliquis 5 mg and the current 30 day co-pay is $85.29  Ran test claim for Xarelto 20 mg and the current 30 day co-pay is $84.13  This test claim was processed through [employer]- copay amounts may vary at other pharmacies due to [employer], or as the patient moves through the different stages of their insurance plan.     Roland Earl, CPHT Pharmacy Technician III Certified Patient Advocate [facility] Patient Advocate Team Direct Number: [phone]  Fax: [phone]

## 2024-02-18 NOTE — TOC Initial Note (Signed)
 Transition of Care (TOC) - Initial/Assessment Note    Patient Details  Name: Jerry [name]. MRN: [id number] Date of Birth: March 05, 1941  Transition of Care [facility]) CM/SW Contact:    Leone Haven, RN Phone Number: 02/18/2024, 2:25 PM  Clinical Narrative:                 From home with spouse, has PCP and insurance on file, states has no HH services in place at this time , has walker, cane, grab bars 3/1 and home oxygen with [employer] at home.  States wife will transport them home at [employer] and family is support system, states [facility] [address]  Pta self ambulatory.  NCM offered choice , patient states he is active with [facility] and would like to continue with them. He has Onalee Hua for PT and Mrs Earlene Plater for his RN.  NCM asked MD for [facility] orders.  Wife will transport him home and she ill bring his oxygen tank.  Expected Discharge Plan: Home w Home Health Services Barriers to Discharge: No Barriers Identified   Patient Goals and CMS Choice Patient states their goals for this hospitalization and ongoing recovery are:: return home with [facility] CMS Medicare.gov Compare Post Acute Care list provided to:: Patient Choice offered to / list presented to : Patient      Expected Discharge Plan and Services   Discharge Planning Services: CM Consult Post Acute Care Choice: Home Health   Expected Discharge Date: 02/18/24               DME Arranged: N/A         HH Arranged: RN, PT HH Agency: [facility] Date [facility] Agency Contacted: 02/18/24 Time HH Agency Contacted: 1422 Representative spoke with at [facility] Agency: Kandee Keen  Prior Living Arrangements/Services   Lives with:: Spouse Patient language and need for interpreter reviewed:: Yes Do you feel safe going back to the place where you live?: Yes      Need for Family Participation in Patient Care: Yes (Comment) Care giver support system in place?: Yes (comment) Current home services: DME (rolling walker, cane,  3/1, grab bars, oxygen) Criminal Activity/Legal Involvement Pertinent to Current Situation/Hospitalization: No - Comment as needed  Activities of Daily Living   ADL Screening (condition at time of admission) Independently performs ADLs?: No Does the patient have a NEW difficulty with bathing/dressing/toileting/self-feeding that is expected to last >3 days?: No Does the patient have a NEW difficulty with getting in/out of bed, walking, or climbing stairs that is expected to last >3 days?: No Does the patient have a NEW difficulty with communication that is expected to last >3 days?: No Is the patient deaf or have difficulty hearing?: No Does the patient have difficulty seeing, even when wearing glasses/contacts?: No Does the patient have difficulty concentrating, remembering, or making decisions?: No  Permission Sought/Granted Permission sought to share information with : Case Manager, Family Supports Permission granted to share information with : Yes, Verbal Permission Granted  Share Information with NAME: Jerry [name]  Permission granted to share info w AGENCY: [facility]  Permission granted to share info w Relationship: wife  Permission granted to share info w Contact Information: [phone]  Emotional Assessment Appearance:: Appears stated age Attitude/Demeanor/Rapport: Engaged Affect (typically observed): Appropriate Orientation: : Oriented to Self, Oriented to Place, Oriented to  Time, Oriented to Situation Alcohol / Substance Use: Not Applicable Psych Involvement: No (comment)  Admission diagnosis:  Shortness of  breath [R06.02] SOB (shortness of breath) [R06.02] Acute pulmonary embolism (HCC) [I26.99] Patient Active Problem List   Diagnosis Date Noted   Acute pulmonary embolism (HCC) 02/16/2024   SOB (shortness of breath) 02/15/2024   Moderate pulmonary hypertension (HCC) 02/12/2024   Type 2 MI (myocardial infarction) (HCC) 01/30/2024   Shortness of breath 01/27/2024    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) 01/22/2024   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 12/21/2023   Complication of Ileal conduit (HCC) 12/21/2023   Bilateral leg edema 12/13/2023   Screening for diabetes mellitus 12/13/2023   Malnutrition of moderate degree 06/28/2023   CAP (community acquired pneumonia) 06/26/2023   History of malignant neoplasm of bladder 07/21/2021   Allergic rhinitis 07/21/2021   Asthma 07/21/2021   Benign essential hypertension 07/21/2021   Degenerative cervical spinal stenosis 07/21/2021   Mixed hyperlipidemia 07/21/2021   Tremor 07/21/2021   Vitamin D deficiency 07/21/2021   OSA (obstructive sleep apnea) 07/12/2021   Asthma, mild intermittent    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 [place] [address] Phone: [phone] Fax: [phone]     Social Drivers of Health (SDOH) Social History: SDOH Screenings   Food Insecurity: No Food Insecurity (02/15/2024)  Housing: Low Risk  (02/15/2024)  Transportation Needs: No Transportation Needs (02/15/2024)  Utilities: Not At Risk (02/15/2024)  Social Connections: Socially Integrated (02/15/2024)  Tobacco Use: Low Risk  (02/15/2024)   SDOH Interventions:     Readmission Risk Interventions    02/18/2024    2:20 PM 06/27/2023   12:12 PM  Readmission Risk Prevention Plan  Transportation Screening Complete Complete  PCP or Specialist Appt within 5-7 Days  Complete  PCP or Specialist Appt within 3-5 Days Complete   Home Care Screening  Complete  Medication Review (RN CM)  Complete  HRI or Home Care Consult Complete   Palliative  Care Screening Not Applicable   Medication Review (RN Care Manager) Complete

## 2024-02-18 NOTE — Progress Notes (Addendum)
 PHARMACY - ANTICOAGULATION CONSULT NOTE  Pharmacy Consult for Heparin Indication: pulmonary embolus  No Known Allergies  Patient Measurements: Height: 5\' 11"  (180.3 cm) Weight: 76.7 kg (169 lb 1.6 oz) IBW/kg (Calculated) : 75.3 HEPARIN DW (KG): 78.1  Vital Signs: Temp: 98.7 F (37.1 C) (04/07 0734) Temp Source: Oral (04/07 0734) BP: 145/79 (04/07 0831) Pulse Rate: 83 (04/07 0734)  Labs: Recent Labs    02/16/24 0250 02/16/24 0852 02/17/24 0246 02/17/24 1845 02/18/24 0302  HGB 7.9*   < > 8.0* 8.9* 8.1*  HCT 28.6*   < > 28.5* 31.4* 28.2*  PLT 215  --  214  --  208  HEPARINUNFRC 0.74*   < > 0.63 0.44 0.44  CREATININE 1.85*  --  1.93*  --  2.23*   < > = values in this interval not displayed.    Estimated Creatinine Clearance: 28.1 mL/min (A) (by C-G formula based on SCr of 2.23 mg/dL (H)).  Assessment: 83 year old male with PE. PMH signficant for CKD IIIb, NSTEMI, pulmonary HTN, prostate cancer. Not on anticoagulants PTA. Pharmacy is consulted for heparin management.   -Heparin level therapeutic (0.44) this am on 900 units/hr  -hg= 8.9 -FOBT + on 4/4 and GI saw with no plans for procedures  Goal of Therapy:  Heparin level 0.3-0.7 units/ml Monitor platelets by anticoagulation protocol: Yes   Plan:  Continue heparin drip at 900 units/hr. Next heparin level and CBC in am. Follow up for transition to oral anticoagulation.  Harland German, PharmD Clinical Pharmacist **Pharmacist phone directory can now be found on [email or web address] ([email or web address]).  Listed under [facility].  Addendum -plans to change to apixaban today (cost ~ $85/month)  Plan -Discontinue heparin  -apixaban 10mg  po bid for 7 days then 5mg  po bid  Harland German, PharmD Clinical Pharmacist **Pharmacist phone directory can now be found on [email or web address] (PW [email or web address]).  Listed under [facility].

## 2024-02-18 NOTE — Plan of Care (Signed)
  Problem: Coping: Goal: Ability to adjust to condition or change in health will improve Outcome: Progressing   Problem: Fluid Volume: Goal: Ability to maintain a balanced intake and output will improve Outcome: Progressing   Problem: Health Behavior/Discharge Planning: Goal: Ability to manage health-related needs will improve Outcome: Progressing   Problem: Tissue Perfusion: Goal: Adequacy of tissue perfusion will improve Outcome: Progressing

## 2024-02-21 ENCOUNTER — Emergency Department (HOSPITAL_COMMUNITY)

## 2024-02-21 ENCOUNTER — Encounter (HOSPITAL_COMMUNITY): Payer: Self-pay | Admitting: Internal Medicine

## 2024-02-21 ENCOUNTER — Inpatient Hospital Stay (HOSPITAL_COMMUNITY)
Admission: EM | Admit: 2024-02-21 | Discharge: 2024-02-24 | DRG: 377 | Disposition: A | Discharge status: Home Health | Attending: Internal Medicine | Admitting: Internal Medicine

## 2024-02-21 DIAGNOSIS — K5521 Angiodysplasia of colon with hemorrhage: Principal | ICD-10-CM | POA: Diagnosis present

## 2024-02-21 DIAGNOSIS — I2699 Other pulmonary embolism without acute cor pulmonale: Secondary | ICD-10-CM | POA: Diagnosis present

## 2024-02-21 DIAGNOSIS — I82409 Acute embolism and thrombosis of unspecified deep veins of unspecified lower extremity: Secondary | ICD-10-CM

## 2024-02-21 DIAGNOSIS — I82432 Acute embolism and thrombosis of left popliteal vein: Secondary | ICD-10-CM | POA: Diagnosis present

## 2024-02-21 DIAGNOSIS — N184 Chronic kidney disease, stage 4 (severe): Secondary | ICD-10-CM | POA: Diagnosis present

## 2024-02-21 DIAGNOSIS — Z79899 Other long term (current) drug therapy: Secondary | ICD-10-CM

## 2024-02-21 DIAGNOSIS — E861 Hypovolemia: Secondary | ICD-10-CM | POA: Diagnosis present

## 2024-02-21 DIAGNOSIS — F0150 Vascular dementia without behavioral disturbance: Secondary | ICD-10-CM | POA: Insufficient documentation

## 2024-02-21 DIAGNOSIS — F0154 Vascular dementia, unspecified severity, with anxiety: Secondary | ICD-10-CM | POA: Diagnosis present

## 2024-02-21 DIAGNOSIS — Z86718 Personal history of other venous thrombosis and embolism: Secondary | ICD-10-CM | POA: Diagnosis not present

## 2024-02-21 DIAGNOSIS — I129 Hypertensive chronic kidney disease with stage 1 through stage 4 chronic kidney disease, or unspecified chronic kidney disease: Secondary | ICD-10-CM | POA: Diagnosis present

## 2024-02-21 DIAGNOSIS — Z95828 Presence of other vascular implants and grafts: Secondary | ICD-10-CM | POA: Diagnosis not present

## 2024-02-21 DIAGNOSIS — R578 Other shock: Secondary | ICD-10-CM | POA: Diagnosis present

## 2024-02-21 DIAGNOSIS — I214 Non-ST elevation (NSTEMI) myocardial infarction: Secondary | ICD-10-CM | POA: Diagnosis present

## 2024-02-21 DIAGNOSIS — I272 Pulmonary hypertension, unspecified: Secondary | ICD-10-CM | POA: Diagnosis present

## 2024-02-21 DIAGNOSIS — E872 Acidosis, unspecified: Secondary | ICD-10-CM | POA: Diagnosis present

## 2024-02-21 DIAGNOSIS — F0153 Vascular dementia, unspecified severity, with mood disturbance: Secondary | ICD-10-CM | POA: Diagnosis present

## 2024-02-21 DIAGNOSIS — Z833 Family history of diabetes mellitus: Secondary | ICD-10-CM

## 2024-02-21 DIAGNOSIS — Z8546 Personal history of malignant neoplasm of prostate: Secondary | ICD-10-CM

## 2024-02-21 DIAGNOSIS — I82402 Acute embolism and thrombosis of unspecified deep veins of left lower extremity: Secondary | ICD-10-CM

## 2024-02-21 DIAGNOSIS — E119 Type 2 diabetes mellitus without complications: Secondary | ICD-10-CM

## 2024-02-21 DIAGNOSIS — G4733 Obstructive sleep apnea (adult) (pediatric): Secondary | ICD-10-CM | POA: Diagnosis not present

## 2024-02-21 DIAGNOSIS — I2609 Other pulmonary embolism with acute cor pulmonale: Secondary | ICD-10-CM | POA: Diagnosis not present

## 2024-02-21 DIAGNOSIS — Z9981 Dependence on supplemental oxygen: Secondary | ICD-10-CM

## 2024-02-21 DIAGNOSIS — F411 Generalized anxiety disorder: Secondary | ICD-10-CM | POA: Diagnosis not present

## 2024-02-21 DIAGNOSIS — N179 Acute kidney failure, unspecified: Secondary | ICD-10-CM | POA: Diagnosis present

## 2024-02-21 DIAGNOSIS — N1832 Chronic kidney disease, stage 3b: Secondary | ICD-10-CM | POA: Insufficient documentation

## 2024-02-21 DIAGNOSIS — K2970 Gastritis, unspecified, without bleeding: Secondary | ICD-10-CM | POA: Diagnosis not present

## 2024-02-21 DIAGNOSIS — I10 Essential (primary) hypertension: Secondary | ICD-10-CM | POA: Diagnosis present

## 2024-02-21 DIAGNOSIS — K921 Melena: Secondary | ICD-10-CM | POA: Diagnosis not present

## 2024-02-21 DIAGNOSIS — D62 Acute posthemorrhagic anemia: Secondary | ICD-10-CM | POA: Diagnosis present

## 2024-02-21 DIAGNOSIS — E782 Mixed hyperlipidemia: Secondary | ICD-10-CM | POA: Diagnosis present

## 2024-02-21 DIAGNOSIS — I21A1 Myocardial infarction type 2: Secondary | ICD-10-CM

## 2024-02-21 DIAGNOSIS — D509 Iron deficiency anemia, unspecified: Secondary | ICD-10-CM | POA: Diagnosis present

## 2024-02-21 DIAGNOSIS — Z9079 Acquired absence of other genital organ(s): Secondary | ICD-10-CM

## 2024-02-21 DIAGNOSIS — R571 Hypovolemic shock: Secondary | ICD-10-CM | POA: Diagnosis present

## 2024-02-21 DIAGNOSIS — K31811 Angiodysplasia of stomach and duodenum with bleeding: Secondary | ICD-10-CM | POA: Diagnosis present

## 2024-02-21 DIAGNOSIS — J45909 Unspecified asthma, uncomplicated: Secondary | ICD-10-CM | POA: Diagnosis present

## 2024-02-21 DIAGNOSIS — I959 Hypotension, unspecified: Secondary | ICD-10-CM | POA: Insufficient documentation

## 2024-02-21 DIAGNOSIS — Z8551 Personal history of malignant neoplasm of bladder: Secondary | ICD-10-CM

## 2024-02-21 DIAGNOSIS — J4520 Mild intermittent asthma, uncomplicated: Secondary | ICD-10-CM | POA: Diagnosis present

## 2024-02-21 DIAGNOSIS — Z7982 Long term (current) use of aspirin: Secondary | ICD-10-CM

## 2024-02-21 DIAGNOSIS — Z9049 Acquired absence of other specified parts of digestive tract: Secondary | ICD-10-CM

## 2024-02-21 DIAGNOSIS — J9611 Chronic respiratory failure with hypoxia: Secondary | ICD-10-CM | POA: Diagnosis present

## 2024-02-21 DIAGNOSIS — E1122 Type 2 diabetes mellitus with diabetic chronic kidney disease: Secondary | ICD-10-CM | POA: Diagnosis present

## 2024-02-21 DIAGNOSIS — K922 Gastrointestinal hemorrhage, unspecified: Principal | ICD-10-CM

## 2024-02-21 DIAGNOSIS — Z86711 Personal history of pulmonary embolism: Secondary | ICD-10-CM

## 2024-02-21 DIAGNOSIS — K449 Diaphragmatic hernia without obstruction or gangrene: Secondary | ICD-10-CM | POA: Diagnosis present

## 2024-02-21 DIAGNOSIS — I2690 Septic pulmonary embolism without acute cor pulmonale: Secondary | ICD-10-CM

## 2024-02-21 DIAGNOSIS — M1990 Unspecified osteoarthritis, unspecified site: Secondary | ICD-10-CM | POA: Diagnosis present

## 2024-02-21 DIAGNOSIS — Z7984 Long term (current) use of oral hypoglycemic drugs: Secondary | ICD-10-CM

## 2024-02-21 DIAGNOSIS — J4489 Other specified chronic obstructive pulmonary disease: Secondary | ICD-10-CM | POA: Diagnosis present

## 2024-02-21 DIAGNOSIS — K222 Esophageal obstruction: Secondary | ICD-10-CM | POA: Diagnosis present

## 2024-02-21 DIAGNOSIS — Z87442 Personal history of urinary calculi: Secondary | ICD-10-CM

## 2024-02-21 DIAGNOSIS — D6832 Hemorrhagic disorder due to extrinsic circulating anticoagulants: Secondary | ICD-10-CM | POA: Diagnosis present

## 2024-02-21 DIAGNOSIS — Z7951 Long term (current) use of inhaled steroids: Secondary | ICD-10-CM

## 2024-02-21 DIAGNOSIS — Z7901 Long term (current) use of anticoagulants: Secondary | ICD-10-CM

## 2024-02-21 DIAGNOSIS — E875 Hyperkalemia: Secondary | ICD-10-CM | POA: Diagnosis present

## 2024-02-21 DIAGNOSIS — D649 Anemia, unspecified: Secondary | ICD-10-CM

## 2024-02-21 DIAGNOSIS — F0390 Unspecified dementia without behavioral disturbance: Secondary | ICD-10-CM | POA: Diagnosis not present

## 2024-02-21 DIAGNOSIS — I2510 Atherosclerotic heart disease of native coronary artery without angina pectoris: Secondary | ICD-10-CM | POA: Diagnosis present

## 2024-02-21 DIAGNOSIS — M7989 Other specified soft tissue disorders: Secondary | ICD-10-CM | POA: Diagnosis not present

## 2024-02-21 LAB — PREPARE RBC (CROSSMATCH)

## 2024-02-21 LAB — COMPREHENSIVE METABOLIC PANEL WITH GFR
ALT: 21 U/L (ref 0–44)
AST: 21 U/L (ref 15–41)
Albumin: 2.5 g/dL — ABNORMAL LOW (ref 3.5–5.0)
Alkaline Phosphatase: 37 U/L — ABNORMAL LOW (ref 38–126)
Anion gap: 9 (ref 5–15)
BUN: 65 mg/dL — ABNORMAL HIGH (ref 8–23)
CO2: 23 mmol/L (ref 22–32)
Calcium: 8.5 mg/dL — ABNORMAL LOW (ref 8.9–10.3)
Chloride: 109 mmol/L (ref 98–111)
Creatinine, Ser: 2.78 mg/dL — ABNORMAL HIGH (ref 0.61–1.24)
GFR, Estimated: 22 mL/min — ABNORMAL LOW (ref >60)
Glucose, Bld: 130 mg/dL — ABNORMAL HIGH (ref 70–99)
Potassium: 6 mmol/L — ABNORMAL HIGH (ref 3.5–5.1)
Sodium: 141 mmol/L (ref 135–145)
Total Bilirubin: 0.4 mg/dL (ref 0.0–1.2)
Total Protein: 5 g/dL — ABNORMAL LOW (ref 6.5–8.1)

## 2024-02-21 LAB — LACTIC ACID, PLASMA: Lactic Acid, Venous: 2 mmol/L (ref 0.5–1.9)

## 2024-02-21 LAB — ABO/RH: ABO/RH(D): O POS

## 2024-02-21 LAB — CBC WITH DIFFERENTIAL/PLATELET
Abs Immature Granulocytes: 0.15 10*3/uL — ABNORMAL HIGH (ref 0.00–0.07)
Basophils Absolute: 0 10*3/uL (ref 0.0–0.1)
Basophils Relative: 0 %
Eosinophils Absolute: 0.3 10*3/uL (ref 0.0–0.5)
Eosinophils Relative: 3 %
HCT: 23.1 % — ABNORMAL LOW (ref 39.0–52.0)
Hemoglobin: 6.2 g/dL — CL (ref 13.0–17.0)
Immature Granulocytes: 1 %
Lymphocytes Relative: 24 %
Lymphs Abs: 2.5 10*3/uL (ref 0.7–4.0)
MCH: 21.8 pg — ABNORMAL LOW (ref 26.0–34.0)
MCHC: 26.8 g/dL — ABNORMAL LOW (ref 30.0–36.0)
MCV: 81.1 fL (ref 80.0–100.0)
Monocytes Absolute: 0.8 10*3/uL (ref 0.1–1.0)
Monocytes Relative: 8 %
Neutro Abs: 6.7 10*3/uL (ref 1.7–7.7)
Neutrophils Relative %: 64 %
Platelets: 178 10*3/uL (ref 150–400)
RBC: 2.85 MIL/uL — ABNORMAL LOW (ref 4.22–5.81)
RDW: 21.3 % — ABNORMAL HIGH (ref 11.5–15.5)
WBC: 10.5 10*3/uL (ref 4.0–10.5)
nRBC: 0 % (ref 0.0–0.2)

## 2024-02-21 LAB — GLUCOSE, CAPILLARY: Glucose-Capillary: 99 mg/dL (ref 70–99)

## 2024-02-21 LAB — POC OCCULT BLOOD, ED: Fecal Occult Bld: POSITIVE — AB

## 2024-02-21 LAB — PROTIME-INR
INR: 1.6 — ABNORMAL HIGH (ref 0.8–1.2)
Prothrombin Time: 18.9 s — ABNORMAL HIGH (ref 11.4–15.2)

## 2024-02-21 MED ORDER — INSULIN ASPART 100 UNIT/ML IJ SOLN
0.0000 [IU] | Freq: Three times a day (TID) | INTRAMUSCULAR | Status: DC
Start: 2024-02-22 — End: 2024-02-24

## 2024-02-21 MED ORDER — OLANZAPINE 5 MG PO TABS: 2.5000 mg | ORAL_TABLET | Freq: Two times a day (BID) | ORAL | Status: DC | PRN

## 2024-02-21 MED ORDER — PANTOPRAZOLE SODIUM 40 MG IV SOLR: 40.0000 mg | Freq: Two times a day (BID) | INTRAVENOUS | Status: DC

## 2024-02-21 MED ORDER — ESCITALOPRAM OXALATE 10 MG PO TABS
5.0000 mg | ORAL_TABLET | Freq: Every day | ORAL | Status: DC
  Administered 2024-02-22 – 2024-02-24 (×3): 5 mg via ORAL
  Filled 2024-02-21 (×4): qty 1

## 2024-02-21 MED ORDER — FLUTICASONE FUROATE-VILANTEROL 100-25 MCG/ACT IN AEPB
1.0000 | INHALATION_SPRAY | Freq: Two times a day (BID) | RESPIRATORY_TRACT | Status: DC
  Administered 2024-02-22 – 2024-02-24 (×5): 1 via RESPIRATORY_TRACT
  Filled 2024-02-21: qty 28

## 2024-02-21 MED ORDER — SODIUM ZIRCONIUM CYCLOSILICATE 10 G PO PACK: 10.0000 g | PACK | Freq: Once | ORAL | Status: DC

## 2024-02-21 MED ORDER — SODIUM CHLORIDE 0.9% FLUSH: 3.0000 mL | INTRAVENOUS | Status: DC | PRN

## 2024-02-21 MED ORDER — ALPRAZOLAM 0.5 MG PO TABS: 0.5000 mg | ORAL_TABLET | Freq: Two times a day (BID) | ORAL | Status: DC | PRN

## 2024-02-21 MED ORDER — LACTATED RINGERS IV SOLN: INTRAVENOUS | Status: DC

## 2024-02-21 MED ORDER — IPRATROPIUM-ALBUTEROL 0.5-2.5 (3) MG/3ML IN SOLN: 3.0000 mL | RESPIRATORY_TRACT | Status: DC | PRN

## 2024-02-21 MED ORDER — ACETAMINOPHEN 325 MG PO TABS
650.0000 mg | ORAL_TABLET | Freq: Four times a day (QID) | ORAL | Status: DC | PRN
  Administered 2024-02-24: 650 mg via ORAL
  Filled 2024-02-21: qty 2

## 2024-02-21 MED ORDER — ALBUTEROL SULFATE (2.5 MG/3ML) 0.083% IN NEBU
10.0000 mg | INHALATION_SOLUTION | Freq: Once | RESPIRATORY_TRACT | Status: AC
  Administered 2024-02-21: 10 mg via RESPIRATORY_TRACT
  Filled 2024-02-21: qty 12

## 2024-02-21 MED ORDER — ALBUTEROL SULFATE (2.5 MG/3ML) 0.083% IN NEBU
2.5000 mg | INHALATION_SOLUTION | Freq: Four times a day (QID) | RESPIRATORY_TRACT | Status: DC | PRN
Start: 2024-02-21 — End: 2024-02-24
  Administered 2024-02-22: 2.5 mg via RESPIRATORY_TRACT
  Filled 2024-02-21: qty 3

## 2024-02-21 MED ORDER — MEMANTINE HCL 10 MG PO TABS
5.0000 mg | ORAL_TABLET | Freq: Two times a day (BID) | ORAL | Status: DC
  Administered 2024-02-21 – 2024-02-24 (×6): 5 mg via ORAL
  Filled 2024-02-21 (×5): qty 1

## 2024-02-21 MED ORDER — SODIUM CHLORIDE 0.9% FLUSH
3.0000 mL | Freq: Two times a day (BID) | INTRAVENOUS | Status: DC
Start: 2024-02-21 — End: 2024-02-22
  Administered 2024-02-21 – 2024-02-22 (×3): 3 mL via INTRAVENOUS

## 2024-02-21 MED ORDER — INSULIN ASPART 100 UNIT/ML IJ SOLN: 0.0000 [IU] | Freq: Every day | INTRAMUSCULAR | Status: DC

## 2024-02-21 MED ORDER — ONDANSETRON HCL 4 MG/2ML IJ SOLN: 4.0000 mg | Freq: Four times a day (QID) | INTRAMUSCULAR | Status: DC | PRN

## 2024-02-21 MED ORDER — CALCIUM GLUCONATE-NACL 1-0.675 GM/50ML-% IV SOLN: 1.0000 g | INTRAVENOUS | Status: DC

## 2024-02-21 MED ORDER — LACTATED RINGERS IV BOLUS
1000.0000 mL | Freq: Once | INTRAVENOUS | Status: AC
  Administered 2024-02-21: 1000 mL via INTRAVENOUS

## 2024-02-21 MED ORDER — PANTOPRAZOLE SODIUM 40 MG IV SOLR
40.0000 mg | Freq: Two times a day (BID) | INTRAVENOUS | Status: DC
  Administered 2024-02-22 – 2024-02-23 (×3): 40 mg via INTRAVENOUS
  Filled 2024-02-21 (×3): qty 10

## 2024-02-21 MED ORDER — ATORVASTATIN CALCIUM 10 MG PO TABS
20.0000 mg | ORAL_TABLET | Freq: Every day | ORAL | Status: DC
  Administered 2024-02-22 – 2024-02-24 (×3): 20 mg via ORAL
  Filled 2024-02-21 (×4): qty 2

## 2024-02-21 MED ORDER — ONDANSETRON HCL 4 MG PO TABS: 4.0000 mg | ORAL_TABLET | Freq: Four times a day (QID) | ORAL | Status: DC | PRN

## 2024-02-21 MED ORDER — PANTOPRAZOLE SODIUM 40 MG IV SOLR
40.0000 mg | Freq: Once | INTRAVENOUS | Status: AC
  Administered 2024-02-21: 40 mg via INTRAVENOUS
  Filled 2024-02-21: qty 10

## 2024-02-21 MED ORDER — SODIUM CHLORIDE 0.9 % IV SOLN: 250.0000 mL | INTRAVENOUS | Status: DC | PRN

## 2024-02-21 MED ORDER — ACETAMINOPHEN 650 MG RE SUPP: 650.0000 mg | Freq: Four times a day (QID) | RECTAL | Status: DC | PRN

## 2024-02-21 MED ORDER — SODIUM CHLORIDE 0.9% IV SOLUTION: Freq: Once | INTRAVENOUS | Status: DC

## 2024-02-21 NOTE — H&P (Addendum)
 History and Physical    Jerry [name]. ZOX:[id number] DOB: [birth year] DOA: 02/21/2024  PCP: [name], MD   Patient coming from: Home   Chief Complaint:  Chief Complaint  Patient presents with   Rectal Bleeding   Hypotension   ED TRIAGE note:  Patient BIB [facility] from home after wife and home health nurse saw blood in the toilet this morning and through out the day. Patient BP remained <90 sys all day, initial EMS 80/40, 2nd 98/50 after LR. Recently admitted for PE and put on blood thinners. EMS reports blood in toilet on their arrival as well.       HPI:  Jerry [name]. is a 83 y.o. 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  And also has acute pulmonary embolism and left lower extremity DVT being started on Eliquis and discharged from hospital on 02/18/2024 presented to the ED for lower GI bleed.  During my evaluation at the bedside patient reported generalized abdominal pain.  Per patient and his wife at the bedside the blood was mixed with the stool and burgundy in color.  Reported previous episode of GI bleed given he has history of internal hemorrhoid.  Wife at the bedside requesting for EGD and colonoscopy to look for the source of the GI bleed.  Patient does not have any other complaint at this time.  Denies any chest pain, shortness of breath and palpitation.   ED Course:  At presentation to ED patient blood pressure was 88/61 and most recent blood pressure is 97/66.  Tachycardic and O2 sat 98% room air.  FOBT positive. CMP showing elevated potassium 6, elevated creatinine 2.78, elevated BUN 65, low albumin 2.5 and GFR 22. CBC showing  hemoglobin has been dropped 8.1-6.2.  Baseline hemoglobin around 8-9.  Normal platelet and WBC count. Elevated pro time INR 18.9 and 1.6.  Pending chest x-ray.  In the ED patient has been given 1 L of LR bolus, Transfusing 2 unit of blood. ED physician consulted [facility] Dr. Levora Angel who will evaluate patient in the daytime.    Hospitalist has been consulted for further evaluation management of GI bleed, hypotension and recent pulmonary embolism and left lower extremity PT.   Significant labs in the ED: Lab Orders         Comprehensive metabolic panel         CBC with Differential         Protime-INR         Hemoglobin and hematocrit, blood         CBC         Comprehensive metabolic panel         Lactic acid, plasma         Urinalysis, Routine w reflex microscopic -Urine, Clean Catch         POC occult blood, ED Provider will collect       Review of Systems:  Review of Systems  Constitutional:  Positive for malaise/fatigue. Negative for chills, fever and weight loss.  Respiratory:  Negative for cough, sputum production, shortness of breath and wheezing.   Cardiovascular:  Negative for chest pain, palpitations and leg swelling.  Gastrointestinal:  Positive for blood in stool and melena. Negative for abdominal  pain, constipation, diarrhea, heartburn, nausea and vomiting.  Genitourinary:  Negative for dysuria, frequency, hematuria and urgency.  Musculoskeletal:  Negative for back pain, falls, joint pain, myalgias and neck pain.  Neurological:  Negative for dizziness and headaches.  Endo/Heme/Allergies:  Does not bruise/bleed easily.  Psychiatric/Behavioral:  The patient is not nervous/anxious.     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   Malnutrition of moderate degree 06/28/2023   Mixed hyperlipidemia 07/21/2021   OSA (obstructive sleep apnea) 07/12/2021   NPSG- 03/16/21- AHI 10.3/ hr, desaturation to 87%, body weight 190 lbs     Renal insufficiency 04/17/2015   Syncope 04/17/2015   Tremor 07/21/2021   Vascular dementia (HCC)    Vitamin D deficiency 07/21/2021    Past Surgical History:  Procedure Laterality Date   BACK SURGERY     BIOPSY  10/09/2023   Procedure: BIOPSY;  Surgeon: Kathi Der, MD;  Location: [facility];  Service: Gastroenterology;;   CHOLECYSTECTOMY     COLONOSCOPY WITH PROPOFOL Bilateral 10/09/2023   Procedure: COLONOSCOPY WITH PROPOFOL;  Surgeon: Kathi Der, MD;  Location: [facility];  Service: Gastroenterology;  Laterality: Bilateral;   ESOPHAGOGASTRODUODENOSCOPY (EGD) WITH PROPOFOL Bilateral 10/09/2023   Procedure: ESOPHAGOGASTRODUODENOSCOPY (EGD) WITH PROPOFOL;  Surgeon: Kathi Der, MD;  Location: [facility];  Service: Gastroenterology;  Laterality: Bilateral;   HOT HEMOSTASIS N/A 10/09/2023   Procedure: HOT HEMOSTASIS (ARGON PLASMA COAGULATION/BICAP);  Surgeon: Kathi Der, MD;  Location: [facility];  Service: Gastroenterology;  Laterality: N/A;   KNEE SURGERY Left    LOWER EXTREMITY VENOGRAPHY  01/29/2024   Procedure: LOWER EXTREMITY VENOGRAPHY;  Surgeon: Swaziland, Peter M, MD;  Location: [facility] INVASIVE CV LAB;  Service: Cardiovascular;;   RIGHT/LEFT HEART CATH AND CORONARY ANGIOGRAPHY N/A 01/29/2024   Procedure: RIGHT/LEFT HEART CATH AND CORONARY ANGIOGRAPHY;  Surgeon: Swaziland, Peter M, MD;  Location: [facility] INVASIVE CV LAB;  Service: Cardiovascular;  Laterality: N/A;   SHOULDER SURGERY     for rotator cuff tear     reports that he has  never smoked. He has never used smokeless tobacco. He reports current alcohol use of about 3.0 standard drinks of alcohol per week. He reports current drug use. Drug: Marijuana.  No Known Allergies  Family History  Problem Relation Age of Onset   Diabetes Mother    Diabetes Father     Prior to Admission medications   Medication Sig Start Date End Date Taking? Authorizing Provider  albuterol (VENTOLIN HFA) 108 (90 Base) MCG/ACT inhaler Inhale 2 puffs into the lungs every 6 (six) hours as needed for wheezing or shortness of breath. 07/12/21   Waymon Budge, MD  ALPRAZolam Prudy Feeler) 0.5 MG tablet Take 0.5 mg by mouth 2 (two) times daily as needed for anxiety.    [provider]  apixaban (ELIQUIS) 5 MG TABS tablet Take 1 tablet (5 mg total) by mouth 2 (two) times daily. 03/19/24   Mdala-Gausi, Gwenette Greet, MD  APIXABAN Everlene Balls) VTE STARTER PACK (10MG  AND 5MG ) Take as directed  on package: start with two-5mg  tablets twice daily for 7 days. On day 8, switch to one-5mg  tablet twice daily. 02/18/24   Mdala-Gausi, Gwenette Greet, MD  aspirin EC 81 MG tablet Take 1 tablet (81 mg total) by mouth daily. Swallow whole. 02/04/24 03/05/24  Lanae Boast, MD  atorvastatin (LIPITOR) 20 MG tablet Take 1 tablet (20 mg total) by mouth daily. 02/04/24 03/05/24  Lanae Boast, MD  Cholecalciferol 1000 UNITS tablet Take 1,000 Units by mouth daily.    [provider]  Cyanocobalamin (VITAMIN B12 PO) Take 1 tablet by mouth daily.    [provider]  DULERA 200-5 MCG/ACT AERO Inhale 2 puffs into the lungs 2 (two) times daily.    [provider]  escitalopram (LEXAPRO) 5 MG tablet Take 1 tablet (5 mg total) by mouth daily. 06/06/23 01/26/25  Earney Navy, NP  FeFum-FePoly-FA-B Cmp-C-Biot Henreitta Cea) CAPS Take 1 capsule by mouth daily.    [provider]  furosemide (LASIX) 40 MG tablet Take 0.5 tablets (20 mg total) by mouth daily. 02/18/24   Mdala-Gausi, Gwenette Greet, MD   ipratropium-albuterol (DUONEB) 0.5-2.5 (3) MG/3ML SOLN Inhale 3 mLs into the lungs every 2 (two) hours as needed (SOB/Wheezing).    [provider]  metoprolol tartrate (LOPRESSOR) 50 MG tablet Take 1 tablet (50 mg total) by mouth 2 (two) times daily. 02/03/24 03/04/24  Lanae Boast, MD  OLANZapine (ZYPREXA) 2.5 MG tablet Take 1 tablet (2.5 mg total) by mouth 2 (two) times daily as needed (agitation). 06/05/23   Linwood Dibbles, MD  pantoprazole (PROTONIX) 40 MG tablet Take 1 tablet (40 mg total) by mouth 2 (two) times daily. 02/18/24   Mdala-Gausi, Gwenette Greet, MD     Physical Exam: Vitals:   02/21/24 1739 02/21/24 1800  BP: (!) 88/61 97/66  Pulse: 100 (!) 101  Resp: 17   Temp: 98.1 F (36.7 C)   TempSrc: Oral   SpO2: 99% 98%    Physical Exam Vitals and nursing note reviewed.  Constitutional:      Appearance: He is ill-appearing.  HENT:     Mouth/Throat:     Mouth: Mucous membranes are dry.  Eyes:     Pupils: Pupils are equal, round, and reactive to light.  Cardiovascular:     Rate and Rhythm: Regular rhythm. Tachycardia present.     Pulses: Normal pulses.     Heart sounds: Normal heart sounds.  Pulmonary:     Effort: Pulmonary effort is normal.     Breath sounds: Normal breath sounds.  Abdominal:     General: Bowel sounds are normal. There is no distension.     Tenderness: There is no abdominal tenderness. There is no guarding or rebound.  Musculoskeletal:     Cervical back: Neck supple.     Right lower leg: No edema.     Left lower leg: No edema.  Skin:    General: Skin is dry.     Capillary Refill: Capillary refill takes less than 2 seconds.  Neurological:     Mental Status: He is alert and oriented to person, place, and time.  Psychiatric:        Mood and Affect: Mood normal.        Thought Content: Thought content normal.      Labs on Admission: I have personally reviewed following labs and imaging studies  CBC: Recent Labs  Lab 02/15/24 0436  02/15/24 1701 02/16/24 0250 02/16/24 0852 02/16/24 1554 02/17/24 0246 02/17/24 1845 02/18/24 0302 02/21/24  1755  WBC 8.5  --  7.0  --   --  4.7  --  6.8 10.5  NEUTROABS  --   --   --   --   --   --   --   --  6.7  HGB 8.4*   < > 7.9*   < > 8.6* 8.0* 8.9* 8.1* 6.2*  HCT 30.5*   < > 28.6*   < > 31.6* 28.5* 31.4* 28.2* 23.1*  MCV 79.0*  --  77.9*  --   --  76.0*  --  76.8* 81.1  PLT 246  --  215  --   --  214  --  208 178   < > = values in this interval not displayed.   Basic Metabolic Panel: Recent Labs  Lab 02/15/24 0436 02/15/24 1701 02/16/24 0250 02/17/24 0246 02/18/24 0302 02/21/24 1755  NA 144  --  139 142 141 141  K 4.8  --  5.6* 4.5 4.6 6.0*  CL 110  --  106 107 106 109  CO2 26  --  27 28 27 23   GLUCOSE 74  --  119* 79 151* 130*  BUN 42*  --  39* 33* 33* 65*  CREATININE 2.04*  --  1.85* 1.93* 2.23* 2.78*  CALCIUM 8.3*  --  8.8* 8.8* 8.5* 8.5*  MG  --  1.6* 1.6* 2.0  --   --    GFR: Estimated Creatinine Clearance: 22.6 mL/min (A) (by C-G formula based on SCr of 2.78 mg/dL (H)). Liver Function Tests: Recent Labs  Lab 02/15/24 1701 02/16/24 0250 02/21/24 1755  AST 23 16 21   ALT 21 19 21   ALKPHOS 44 43 37*  BILITOT 0.4 0.4 0.4  PROT 5.5* 5.2* 5.0*  ALBUMIN 2.7* 2.6* 2.5*   No results for input(s): "LIPASE", "AMYLASE" in the last 168 hours. No results for input(s): "AMMONIA" in the last 168 hours. Coagulation Profile: Recent Labs  Lab 02/21/24 1755  INR 1.6*   Cardiac Enzymes: Recent Labs  Lab 02/15/24 0436 02/15/24 0619  TROPONINIHS 27* 22*   BNP (last 3 results) Recent Labs    01/27/24 1613 01/31/24 1523 02/15/24 0611  BNP 36.8 159.5* 48.9   HbA1C: No results for input(s): "HGBA1C" in the last 72 hours. CBG: Recent Labs  Lab 02/17/24 1622 02/17/24 2111 02/18/24 0411 02/18/24 0754 02/18/24 1122  GLUCAP 124* 137* 110* 95 79   Lipid Profile: No results for input(s): "CHOL", "HDL", "LDLCALC", "TRIG", "CHOLHDL", "LDLDIRECT" in the  last 72 hours. Thyroid Function Tests: No results for input(s): "TSH", "T4TOTAL", "FREET4", "T3FREE", "THYROIDAB" in the last 72 hours. Anemia Panel: No results for input(s): "VITAMINB12", "FOLATE", "FERRITIN", "TIBC", "IRON", "RETICCTPCT" in the last 72 hours. Urine analysis:    Component Value Date/Time   COLORURINE YELLOW 06/26/2023 1520   APPEARANCEUR CLOUDY (A) 06/26/2023 1520   LABSPEC 1.009 06/26/2023 1520   PHURINE 7.0 06/26/2023 1520   GLUCOSEU NEGATIVE 06/26/2023 1520   HGBUR SMALL (A) 06/26/2023 1520   BILIRUBINUR NEGATIVE 06/26/2023 1520   KETONESUR NEGATIVE 06/26/2023 1520   PROTEINUR NEGATIVE 06/26/2023 1520   UROBILINOGEN 0.2 04/17/2015 1820   NITRITE NEGATIVE 06/26/2023 1520   LEUKOCYTESUR LARGE (A) 06/26/2023 1520    Radiological Exams on Admission: I have personally reviewed images No results found.   EKG: Pending EKG    Assessment/Plan: Principal Problem:   GI bleed Active Problems:   Acute pulmonary embolism (HCC)   DVT (deep venous thrombosis) (HCC)   Hyperkalemia  Asthma, chronic   Essential hypertension   Pulmonary hypertension (HCC)   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)   Hypotension   Vascular dementia (HCC)   Non-insulin dependent type 2 diabetes mellitus (HCC)    Assessment and Plan: GI bleed Hemorrhagic shock Hypotension-secondary to GI bleed and hypovolemia -Patient has been presented to emergency department with complaining of noticing blood in the toilet bowl.  In the ED patient systolic blood pressure remained below 90 throughout the day and EMS found 80/40.  Blood pressure has been improved to 97/66 after giving 1 L of LR bolus in the ED. - Patient recently admitted for pulmonary embolism acute hypoxic respiratory failure in the setting of pulmonary embolism, suspected pneumonia. -CTA chest/4 showed bilateral PE with mild clot burden. Ultrasound of the lower extremity showed acute DVT of the  left popliteal vein. - Holding Eliquis and aspirin. - CBC showing hemoglobin dropped to 6.1.  Baseline hemoglobin around 8-9. -Transfusing 2 unit of blood tonight, giving total 2 L of LR bolus, checking lactic acid level, continue maintenance fluid LR 125 cc/h.  Holding home blood pressure regimen. -In the ED patient has been given Protonix 40 mg IV.  Continue Protonix 40 mg twice daily. - Per chart review patient has chronic microcytic anemia concern for occult GI bleed.  He follows [facility].  Underwent colonoscopy in November 2024 showing multiple colonic angioectasias with bleeding that has been treated with APC also noted to have internal hemorrhoid.  During last admission patient has been evaluated by gastroenterology not a good candidate for endoscopic evaluation due to multiple comorbidities. -FOBT positive this time. -Informed [facility] Dr. Bosie Clos will evaluate patient. - Continue to monitor H&H every 8 hours, transfuse as needed to keep the goal hemoglobin above 8. -Clear liquid diet in case patient will need urgent endoscopy colonoscopy if clinically decompensate. -Consulted PCCM for further recommendation. Addendum - Elevated lactic acid 2.  Lactic acidosis in the setting of hypotension and hemorrhagic shock in the context of GI bleed.  In the ED patient has been given 1 L of LR bolus.  Giving second liter of LR bolus and continue maintenance fluid LR 125 cc/h.  Also transfusing 2 units of blood.  Will continue to trend lactic acid.   Acute pulmonary embolism Left lower extremity DVT -CTA chest/4 showed bilateral PE with mild clot burden. Ultrasound of the lower extremity showed acute DVT of the left popliteal vein. -Due to GI bleed holding Eliquis. -Consulted PCCM for further recommendation. Discussed case with PCCM Dr. Gaynell Face recommended to obtain lower extremity DVT and echocardiogram.  If US shows left lower DVT still present in that case patient will need IVC filter however if  it showed resolution of DVT in that case need to decide risk versus benefit continuation of Eliquis in the future after GI bleed resolves. -Pending bilateral lower extremity ultrasound and echocardiogram. -If ultrasound showed persistent DVT in that case need to consult IR for IVC placement. -Discussed this plan with patient's wife at the bedside.   Recent NSTEMI -Holding aspirin.  Patient has recent NSTEMI in setting of pulmonary embolism.   Hyperkalemia -Elevated potassium 6.  Hyperkalemia in the setting of AKI. Treating with calcium gluconate, Lokelma albuterol nebulizer and insulin.   Asthma Chronic respiratory failure 3 L oxygen at baseline - Patient recently found to have acute hypoxic respiratory failure during last hospital admission O2 sat was 85% on room air.  He has been sent home with  3 L oxygen. - Currently O2 sat 98% room air. -Continue Breo Ellipta twice daily and albuterol as needed.  Pulmonary hypertension -History of pulmonary hypertension.  New diagnosis of pulmonary embolism in 02/2024.  Patient follows up with cardiology outpatient.  AKI on CKD stage IIIb -Creatinine 2.78.  Baseline creatinine around 1.8 to 2.14. -AKI on CKD stage IIIb in the setting of hypovolemia in the setting of GI bleed. - In the ED patient has been resuscitated with 2 L of LR bolus.  Continue maintenance fluid. - Checking UA, urine creatinine and sodium - Avoid nephrotoxic agent and renally adjust medications.   Dementia -Continue memantine twice daily.  Generalized anxiety disorder - Continue Lexapro  Non-insulin-dependent DM type II -Continue clear liquid diet and sliding scale insulin   DVT prophylaxis:  SCDs.  Deferring pharmacological DVT prophylaxis in the setting of GI bleed Code Status:  Full Code Diet: Clear liquid diet Family Communication:   Family was present at bedside, at the time of interview. Opportunity was given to ask question and all questions were answered  satisfactorily.  Disposition Plan: Continue monitor development of overt GI bleed, hemodynamic decompensation.  Monitor H&H and transfuse as needed goal to keep hemoglobin above 8 Consults: Gastroenterology and PCCM Admission status:   Inpatient, Step Down Unit  Severity of Illness: The appropriate patient status for this patient is INPATIENT. Inpatient status is judged to be reasonable and necessary in order to provide the required intensity of service to ensure the patient's safety. The patient's presenting symptoms, physical exam findings, and initial radiographic and laboratory data in the context of their chronic comorbidities is felt to place them at high risk for further clinical deterioration. Furthermore, it is not anticipated that the patient will be medically stable for discharge from the hospital within 2 midnights of admission.   * I certify that at the point of admission it is my clinical judgment that the patient will require inpatient hospital care spanning beyond 2 midnights from the point of admission due to high intensity of service, high risk for further deterioration and high frequency of surveillance required.[name]    Tereasa Coop, MD [facility]  How to contact the [facility] Attending or Consulting provider 7A - 7P or covering provider during after hours 7P -7A, for this patient.  Check the care team in [facility] and look for a) attending/consulting [facility] provider listed and b) the [facility] team listed Log into [email or web address] and use [place]'s universal password to access. If you do not have the password, please contact the hospital operator. Locate the [facility] provider you are looking for under [facility] and page to a number that you can be directly reached. If you still have difficulty reaching the provider, please page the [facility] (Director on Call) for the Hospitalists listed on amion for assistance.  02/21/2024, 8:19 PM

## 2024-02-21 NOTE — ED Notes (Signed)
 CCMD called and notified

## 2024-02-21 NOTE — ED Notes (Signed)
 Called floor and spoke to Charge patient will be enroute to floor.

## 2024-02-21 NOTE — ED Notes (Signed)
 Patient continues LR running from EMS per Dr Particia Nearing at bedside.

## 2024-02-21 NOTE — ED Triage Notes (Signed)
 Patient BIB [facility] from home after wife and home health nurse saw blood in the toilet this morning and through out the day. Patient BP remained <90 sys all day, initial EMS 80/40, 2nd 98/50 after LR. Recently admitted for PE and put on blood thinners. EMS reports blood in toilet on their arrival as well.

## 2024-02-21 NOTE — ED Provider Notes (Signed)
 [place] EMERGENCY DEPARTMENT AT [facility] Provider Note   CSN: [id number] Arrival date & time: 02/21/24  1728     History  Chief Complaint  Patient presents with   Rectal Bleeding   Hypotension    Jerry [name]. is a 83 y.o. male.  Pt is a 83 yo male with pmhx significant for asthma, arthritis, htn, dementia, bladder cancer s/p urostomy, ckd, sleep apnea, hld, htn, anemia and recent PE.  Pt was admitted from 4/4-4/7 for PE and was started on eliquis.  Pt's wife noticed blood in toilet this am.  Pt had some more blood in the toilet this afternoon and EMS was called.  Pt's bp low upon EMS arrival (80s).  IV started and 50 cc given en route (close trip).   Pt denies any pain.       Home Medications Prior to Admission medications   Medication Sig Start Date End Date Taking? Authorizing Provider  amoxicillin-clavulanate (AUGMENTIN) 875-125 MG tablet Take 1 tablet by mouth 2 (two) times daily. 02/20/24  Yes [provider]  apixaban (ELIQUIS) 5 MG TABS tablet Take 1 tablet (5 mg total) by mouth 2 (two) times daily. 03/19/24  Yes Mdala-Gausi, Gwenette Greet, MD  Chlorphen-PE-Acetaminophen 4-10-325 MG TABS Take 1 tablet by mouth daily as needed (For sinus). 02/20/24  Yes [provider]  memantine (NAMENDA) 5 MG tablet Take 5 mg by mouth 2 (two) times daily. 02/17/24  Yes [provider]  albuterol (VENTOLIN HFA) 108 (90 Base) MCG/ACT inhaler Inhale 2 puffs into the lungs every 6 (six) hours as needed for wheezing or shortness of breath. 07/12/21   Waymon Budge, MD  ALPRAZolam Prudy Feeler) 0.5 MG tablet Take 0.5 mg by mouth 2 (two) times daily as needed for anxiety.    [provider]  APIXABAN Everlene Balls) VTE STARTER PACK (10MG  AND 5MG ) Take as directed on package: start with two-5mg  tablets twice daily for 7 days. On day 8, switch to one-5mg  tablet twice daily. 02/18/24   Mdala-Gausi, Gwenette Greet, MD  aspirin EC 81 MG tablet Take 1 tablet (81 mg  total) by mouth daily. Swallow whole. 02/04/24 03/05/24  Lanae Boast, MD  atorvastatin (LIPITOR) 20 MG tablet Take 1 tablet (20 mg total) by mouth daily. 02/04/24 03/05/24  Lanae Boast, MD  Cholecalciferol 1000 UNITS tablet Take 1,000 Units by mouth daily.    [provider]  Cyanocobalamin (VITAMIN B12 PO) Take 1 tablet by mouth daily.    [provider]  DULERA 200-5 MCG/ACT AERO Inhale 2 puffs into the lungs 2 (two) times daily.    [provider]  escitalopram (LEXAPRO) 5 MG tablet Take 1 tablet (5 mg total) by mouth daily. 06/06/23 01/26/25  Earney Navy, NP  FeFum-FePoly-FA-B Cmp-C-Biot Henreitta Cea) CAPS Take 1 capsule by mouth daily.    [provider]  furosemide (LASIX) 40 MG tablet Take 0.5 tablets (20 mg total) by mouth daily. 02/18/24   Mdala-Gausi, Gwenette Greet, MD  ipratropium-albuterol (DUONEB) 0.5-2.5 (3) MG/3ML SOLN Inhale 3 mLs into the lungs every 2 (two) hours as needed (SOB/Wheezing).    [provider]  metoprolol tartrate (LOPRESSOR) 50 MG tablet Take 1 tablet (50 mg total) by mouth 2 (two) times daily. 02/03/24 03/04/24  Lanae Boast, MD  OLANZapine (ZYPREXA) 2.5 MG tablet Take 1 tablet (2.5 mg total) by mouth 2 (two) times daily as needed (agitation). 06/05/23   Linwood Dibbles, MD  pantoprazole (PROTONIX) 40 MG tablet Take 1 tablet (  40 mg total) by mouth 2 (two) times daily. 02/18/24   Mdala-Gausi, Gwenette Greet, MD      Allergies    Patient has no known allergies.    Review of Systems   Review of Systems  Gastrointestinal:  Positive for blood in stool.  All other systems reviewed and are negative.   Physical Exam Updated Vital Signs BP 97/66   Pulse (!) 101   Temp 98.1 F (36.7 C) (Oral)   Resp 17   SpO2 98%  Physical Exam Vitals and nursing note reviewed.  Constitutional:      General: He is in acute distress.     Appearance: Normal appearance. He is ill-appearing.  HENT:     Head: Normocephalic and atraumatic.      Right Ear: External ear normal.     Left Ear: External ear normal.     Nose: Nose normal.     Mouth/Throat:     Mouth: Mucous membranes are moist.     Pharynx: Oropharynx is clear.  Eyes:     Extraocular Movements: Extraocular movements intact.     Conjunctiva/sclera: Conjunctivae normal.     Pupils: Pupils are equal, round, and reactive to light.  Cardiovascular:     Rate and Rhythm: Normal rate and regular rhythm.     Pulses: Normal pulses.     Heart sounds: Normal heart sounds.  Pulmonary:     Effort: Pulmonary effort is normal.     Breath sounds: Normal breath sounds.  Abdominal:     General: Abdomen is flat. Bowel sounds are normal.     Palpations: Abdomen is soft.     Comments: Urostomy noted  Genitourinary:    Rectum: Guaiac result positive.     Comments: Melanotic stool on exam Musculoskeletal:        General: Normal range of motion.     Cervical back: Normal range of motion and neck supple.  Skin:    General: Skin is warm.     Capillary Refill: Capillary refill takes less than 2 seconds.  Neurological:     General: No focal deficit present.     Mental Status: He is alert and oriented to person, place, and time.  Psychiatric:        Mood and Affect: Mood normal.        Behavior: Behavior normal.     ED Results / Procedures / Treatments   Labs (all labs ordered are listed, but only abnormal results are displayed) Labs Reviewed  COMPREHENSIVE METABOLIC PANEL WITH GFR - Abnormal; Notable for the following components:      Result Value   Potassium 6.0 (*)    Glucose, Bld 130 (*)    BUN 65 (*)    Creatinine, Ser 2.78 (*)    Calcium 8.5 (*)    Total Protein 5.0 (*)    Albumin 2.5 (*)    Alkaline Phosphatase 37 (*)    GFR, Estimated 22 (*)    All other components within normal limits  CBC WITH DIFFERENTIAL/PLATELET - Abnormal; Notable for the following components:   RBC 2.85 (*)    Hemoglobin 6.2 (*)    HCT 23.1 (*)    MCH 21.8 (*)    MCHC 26.8 (*)    RDW  21.3 (*)    Abs Immature Granulocytes 0.15 (*)    All other components within normal limits  PROTIME-INR - Abnormal; Notable for the following components:   Prothrombin Time 18.9 (*)    INR 1.6 (*)  All other components within normal limits  POC OCCULT BLOOD, ED - Abnormal; Notable for the following components:   Fecal Occult Bld POSITIVE (*)    All other components within normal limits  TYPE AND SCREEN  ABO/RH  PREPARE RBC (CROSSMATCH)    EKG None  Radiology No results found.  Procedures Procedures    Medications Ordered in ED Medications  0.9 %  sodium chloride infusion (Manually program via Guardrails IV Fluids) (has no administration in time range)  sodium zirconium cyclosilicate (LOKELMA) packet 10 g (has no administration in time range)  lactated ringers bolus 1,000 mL (1,000 mLs Intravenous New Bag/Given 02/21/24 1749)  pantoprazole (PROTONIX) injection 40 mg (40 mg Intravenous Given 02/21/24 1829)    ED Course/ Medical Decision Making/ A&P                                 Medical Decision Making Amount and/or Complexity of Data Reviewed Labs: ordered. Radiology: ordered.  Risk Prescription drug management. Decision regarding hospitalization.   This patient presents to the ED for concern of gi bleed, this involves an extensive number of treatment options, and is a complaint that carries with it a high risk of complications and morbidity.  The differential diagnosis includes upper/lower gi bleed   Co morbidities that complicate the patient evaluation  asthma, arthritis, htn, dementia, bladder cancer s/p urostomy, ckd, sleep apnea, hld, htn, anemia and recent PE   Additional history obtained:  Additional history obtained from epic chart review External records from outside source obtained and reviewed including EMS Report   Lab Tests:  I Ordered, and personally interpreted labs.  The pertinent results include:  cbc down to 6.2 (8.1 on 4/7), cmp with  k elevated at 6, bun elevated at 65, cr elevated at 2.78 (4.6, 33 and 2.23 on 4/3),    Imaging Studies ordered:  I ordered imaging studies including cxr  I independently visualized and interpreted imaging which showed  Probable small left effusion with mild atelectasis or infiltrate at  the left base.   I agree with the radiologist interpretation   Cardiac Monitoring:  The patient was maintained on a cardiac monitor.  I personally viewed and interpreted the cardiac monitored which showed an underlying rhythm of: nsr   Medicines ordered and prescription drug management:  I ordered medication including protonix/ivfs/transfusion  for sx  Reevaluation of the patient after these medicines showed that the patient improved I have reviewed the patients home medicines and have made adjustments as needed   Test Considered:  ct   Critical Interventions:  transfusion   Consultations Obtained:  I requested consultation with the gastroenterologist (Dr. Levora Angel),  and discussed lab and imaging findings as well as pertinent plan - he will consult in the am. Pt d/w Dr. Janalyn Shy ([facility]) who will admit.   Problem List / ED Course:  GI bleed:  pt given ivfs and protonix.  BP has improved.  [facility] secure chat message sent to see pt in am Symptomatic anemia:  1 unit transfusion given.  Pt's wife did consent. Hyperkalemia:  lokelma given + IVFs   Reevaluation:  After the interventions noted above, I reevaluated the patient and found that they have :improved   Social Determinants of Health:  Lives at home   Dispostion:  After consideration of the diagnostic results and the patients response to treatment, I feel that the patent would benefit from admit.  CRITICAL CARE Performed by: Jacalyn Lefevre   Total critical care time: 30 minutes  Critical care time was exclusive of separately billable procedures and treating other patients.  Critical care was necessary to treat  or prevent imminent or life-threatening deterioration.  Critical care was time spent personally by me on the following activities: development of treatment plan with patient and/or surrogate as well as nursing, discussions with consultants, evaluation of patient's response to treatment, examination of patient, obtaining history from patient or surrogate, ordering and performing treatments and interventions, ordering and review of laboratory studies, ordering and review of radiographic studies, pulse oximetry and re-evaluation of patient's condition.         Final Clinical Impression(s) / ED Diagnoses Final diagnoses:  Gastrointestinal hemorrhage, unspecified gastrointestinal hemorrhage type  Symptomatic anemia  CKD (chronic kidney disease) stage 4, GFR 15-29 ml/min (HCC)  Hyperkalemia    Rx / DC Orders ED Discharge Orders     None         Jacalyn Lefevre, MD 02/21/24 2339

## 2024-02-22 ENCOUNTER — Encounter (HOSPITAL_COMMUNITY): Payer: Self-pay | Admitting: Internal Medicine

## 2024-02-22 ENCOUNTER — Other Ambulatory Visit: Payer: Self-pay

## 2024-02-22 ENCOUNTER — Inpatient Hospital Stay (HOSPITAL_COMMUNITY)

## 2024-02-22 ENCOUNTER — Encounter (HOSPITAL_COMMUNITY)
Admission: EM | Disposition: A | Discharge status: Home Health | Payer: Self-pay | Source: Home / Self Care | Attending: Internal Medicine

## 2024-02-22 ENCOUNTER — Inpatient Hospital Stay (HOSPITAL_COMMUNITY): Admitting: Anesthesiology

## 2024-02-22 DIAGNOSIS — K922 Gastrointestinal hemorrhage, unspecified: Secondary | ICD-10-CM

## 2024-02-22 DIAGNOSIS — I82432 Acute embolism and thrombosis of left popliteal vein: Secondary | ICD-10-CM

## 2024-02-22 DIAGNOSIS — M7989 Other specified soft tissue disorders: Secondary | ICD-10-CM

## 2024-02-22 DIAGNOSIS — I2609 Other pulmonary embolism with acute cor pulmonale: Secondary | ICD-10-CM | POA: Diagnosis not present

## 2024-02-22 DIAGNOSIS — I82409 Acute embolism and thrombosis of unspecified deep veins of unspecified lower extremity: Secondary | ICD-10-CM

## 2024-02-22 DIAGNOSIS — Z86718 Personal history of other venous thrombosis and embolism: Secondary | ICD-10-CM | POA: Diagnosis not present

## 2024-02-22 DIAGNOSIS — I10 Essential (primary) hypertension: Secondary | ICD-10-CM | POA: Diagnosis not present

## 2024-02-22 DIAGNOSIS — K222 Esophageal obstruction: Secondary | ICD-10-CM | POA: Diagnosis not present

## 2024-02-22 DIAGNOSIS — K2970 Gastritis, unspecified, without bleeding: Secondary | ICD-10-CM | POA: Diagnosis not present

## 2024-02-22 DIAGNOSIS — K449 Diaphragmatic hernia without obstruction or gangrene: Secondary | ICD-10-CM | POA: Diagnosis not present

## 2024-02-22 DIAGNOSIS — N179 Acute kidney failure, unspecified: Secondary | ICD-10-CM | POA: Diagnosis not present

## 2024-02-22 DIAGNOSIS — G4733 Obstructive sleep apnea (adult) (pediatric): Secondary | ICD-10-CM

## 2024-02-22 DIAGNOSIS — Z86711 Personal history of pulmonary embolism: Secondary | ICD-10-CM

## 2024-02-22 DIAGNOSIS — E875 Hyperkalemia: Secondary | ICD-10-CM | POA: Diagnosis not present

## 2024-02-22 HISTORY — PX: VENA CAVA UMBRELLA NECK APPROACH: SHX1851

## 2024-02-22 HISTORY — PX: ESOPHAGOGASTRODUODENOSCOPY: SHX5428

## 2024-02-22 LAB — BASIC METABOLIC PANEL WITH GFR
Anion gap: 6 (ref 5–15)
Anion gap: 7 (ref 5–15)
BUN: 65 mg/dL — ABNORMAL HIGH (ref 8–23)
BUN: 49 mg/dL — ABNORMAL HIGH (ref 8–23)
CO2: 22 mmol/L (ref 22–32)
CO2: 25 mmol/L (ref 22–32)
Calcium: 8.2 mg/dL — ABNORMAL LOW (ref 8.9–10.3)
Calcium: 8.6 mg/dL — ABNORMAL LOW (ref 8.9–10.3)
Chloride: 112 mmol/L — ABNORMAL HIGH (ref 98–111)
Chloride: 111 mmol/L (ref 98–111)
Creatinine, Ser: 2.59 mg/dL — ABNORMAL HIGH (ref 0.61–1.24)
Creatinine, Ser: 2.41 mg/dL — ABNORMAL HIGH (ref 0.61–1.24)
GFR, Estimated: 24 mL/min — ABNORMAL LOW (ref >60)
GFR, Estimated: 26 mL/min — ABNORMAL LOW (ref >60)
Glucose, Bld: 141 mg/dL — ABNORMAL HIGH (ref 70–99)
Glucose, Bld: 120 mg/dL — ABNORMAL HIGH (ref 70–99)
Potassium: 6.3 mmol/L (ref 3.5–5.1)
Potassium: 5.7 mmol/L — ABNORMAL HIGH (ref 3.5–5.1)
Sodium: 140 mmol/L (ref 135–145)
Sodium: 143 mmol/L (ref 135–145)

## 2024-02-22 LAB — COMPREHENSIVE METABOLIC PANEL WITH GFR
ALT: 15 U/L (ref 0–44)
AST: 21 U/L (ref 15–41)
Albumin: 2.3 g/dL — ABNORMAL LOW (ref 3.5–5.0)
Alkaline Phosphatase: 33 U/L — ABNORMAL LOW (ref 38–126)
Anion gap: 10 (ref 5–15)
BUN: 56 mg/dL — ABNORMAL HIGH (ref 8–23)
CO2: 23 mmol/L (ref 22–32)
Calcium: 8.4 mg/dL — ABNORMAL LOW (ref 8.9–10.3)
Chloride: 108 mmol/L (ref 98–111)
Creatinine, Ser: 2.43 mg/dL — ABNORMAL HIGH (ref 0.61–1.24)
GFR, Estimated: 26 mL/min — ABNORMAL LOW (ref >60)
Glucose, Bld: 102 mg/dL — ABNORMAL HIGH (ref 70–99)
Potassium: 5.5 mmol/L — ABNORMAL HIGH (ref 3.5–5.1)
Sodium: 141 mmol/L (ref 135–145)
Total Bilirubin: 0.7 mg/dL (ref 0.0–1.2)
Total Protein: 4.3 g/dL — ABNORMAL LOW (ref 6.5–8.1)

## 2024-02-22 LAB — HEMOGLOBIN AND HEMATOCRIT, BLOOD
HCT: 18.8 % — ABNORMAL LOW (ref 39.0–52.0)
Hemoglobin: 5.5 g/dL — CL (ref 13.0–17.0)

## 2024-02-22 LAB — POCT I-STAT, CHEM 8
BUN: 48 mg/dL — ABNORMAL HIGH (ref 8–23)
Calcium, Ion: 1.16 mmol/L (ref 1.15–1.40)
Chloride: 110 mmol/L (ref 98–111)
Creatinine, Ser: 2.5 mg/dL — ABNORMAL HIGH (ref 0.61–1.24)
Glucose, Bld: 95 mg/dL (ref 70–99)
HCT: 23 % — ABNORMAL LOW (ref 39.0–52.0)
Hemoglobin: 7.8 g/dL — ABNORMAL LOW (ref 13.0–17.0)
Potassium: 5.1 mmol/L (ref 3.5–5.1)
Sodium: 141 mmol/L (ref 135–145)
TCO2: 21 mmol/L — ABNORMAL LOW (ref 22–32)

## 2024-02-22 LAB — CBC
HCT: 25 % — ABNORMAL LOW (ref 39.0–52.0)
Hemoglobin: 7.9 g/dL — ABNORMAL LOW (ref 13.0–17.0)
MCH: 25.7 pg — ABNORMAL LOW (ref 26.0–34.0)
MCHC: 31.6 g/dL (ref 30.0–36.0)
MCV: 81.4 fL (ref 80.0–100.0)
Platelets: 130 10*3/uL — ABNORMAL LOW (ref 150–400)
RBC: 3.07 MIL/uL — ABNORMAL LOW (ref 4.22–5.81)
RDW: 19 % — ABNORMAL HIGH (ref 11.5–15.5)
WBC: 10.1 10*3/uL (ref 4.0–10.5)
nRBC: 0 % (ref 0.0–0.2)

## 2024-02-22 LAB — ECHOCARDIOGRAM COMPLETE
Area-P 1/2: 5.5 cm2
S' Lateral: 2.4 cm

## 2024-02-22 LAB — GLUCOSE, CAPILLARY
Glucose-Capillary: 102 mg/dL — ABNORMAL HIGH (ref 70–99)
Glucose-Capillary: 109 mg/dL — ABNORMAL HIGH (ref 70–99)
Glucose-Capillary: 105 mg/dL — ABNORMAL HIGH (ref 70–99)

## 2024-02-22 LAB — LACTIC ACID, PLASMA
Lactic Acid, Venous: 4 mmol/L (ref 0.5–1.9)
Lactic Acid, Venous: 2.5 mmol/L (ref 0.5–1.9)
Lactic Acid, Venous: 2.5 mmol/L (ref 0.5–1.9)

## 2024-02-22 LAB — PREPARE RBC (CROSSMATCH)

## 2024-02-22 SURGERY — EGD (ESOPHAGOGASTRODUODENOSCOPY)
Anesthesia: Monitor Anesthesia Care

## 2024-02-22 SURGERY — INSERTION, UMBRELLA FILTER, INFERIOR VENA CAVA
Anesthesia: Monitor Anesthesia Care | Site: Groin | Laterality: Right

## 2024-02-22 MED ORDER — GUAIFENESIN-DM 100-10 MG/5ML PO SYRP
5.0000 mL | ORAL_SOLUTION | ORAL | Status: DC | PRN
  Administered 2024-02-23 – 2024-02-24 (×3): 5 mL via ORAL
  Filled 2024-02-22 (×4): qty 5

## 2024-02-22 MED ORDER — LIDOCAINE HCL (PF) 1 % IJ SOLN
INTRAMUSCULAR | Status: DC | PRN
  Administered 2024-02-22: 9 mL

## 2024-02-22 MED ORDER — DEXTROSE 50 % IV SOLN
1.0000 | Freq: Once | INTRAVENOUS | Status: AC
  Administered 2024-02-22: 50 mL via INTRAVENOUS
  Filled 2024-02-22: qty 50

## 2024-02-22 MED ORDER — SODIUM CHLORIDE 0.9 % IV SOLN: INTRAVENOUS | Status: DC

## 2024-02-22 MED ORDER — DEXMEDETOMIDINE HCL IN NACL 80 MCG/20ML IV SOLN
INTRAVENOUS | Status: DC | PRN
  Administered 2024-02-22: 4 ug via INTRAVENOUS
  Administered 2024-02-22: 8 ug via INTRAVENOUS
  Administered 2024-02-22 (×3): 4 ug via INTRAVENOUS

## 2024-02-22 MED ORDER — MIDAZOLAM HCL 5 MG/5ML IJ SOLN
INTRAMUSCULAR | Status: DC | PRN
  Administered 2024-02-22: .5 mg via INTRAVENOUS

## 2024-02-22 MED ORDER — ACETAMINOPHEN 325 MG PO TABS
650.0000 mg | ORAL_TABLET | Freq: Once | ORAL | Status: AC
  Administered 2024-02-22: 650 mg via ORAL
  Filled 2024-02-22 (×2): qty 2

## 2024-02-22 MED ORDER — PROPOFOL 500 MG/50ML IV EMUL
INTRAVENOUS | Status: AC
  Filled 2024-02-22: qty 50

## 2024-02-22 MED ORDER — DEXMEDETOMIDINE HCL IN NACL 80 MCG/20ML IV SOLN
INTRAVENOUS | Status: AC
  Filled 2024-02-22: qty 20

## 2024-02-22 MED ORDER — HEPARIN 6000 UNIT IRRIGATION SOLUTION
Status: AC
Start: 2024-02-22 — End: ?
  Filled 2024-02-22: qty 500

## 2024-02-22 MED ORDER — SODIUM ZIRCONIUM CYCLOSILICATE 10 G PO PACK
10.0000 g | PACK | Freq: Every day | ORAL | Status: AC
  Administered 2024-02-22: 10 g via ORAL
  Filled 2024-02-22: qty 1

## 2024-02-22 MED ORDER — LACTATED RINGERS IV BOLUS
1000.0000 mL | Freq: Once | INTRAVENOUS | Status: AC
  Administered 2024-02-22: 1000 mL via INTRAVENOUS

## 2024-02-22 MED ORDER — LACTATED RINGERS IV SOLN: INTRAVENOUS | Status: DC

## 2024-02-22 MED ORDER — MIDAZOLAM HCL 2 MG/2ML IJ SOLN
INTRAMUSCULAR | Status: AC
  Filled 2024-02-22: qty 2

## 2024-02-22 MED ORDER — LIDOCAINE HCL (PF) 1 % IJ SOLN
INTRAMUSCULAR | Status: AC
Start: 2024-02-22 — End: ?
  Filled 2024-02-22: qty 30

## 2024-02-22 MED ORDER — PROPOFOL 500 MG/50ML IV EMUL
INTRAVENOUS | Status: DC | PRN
  Administered 2024-02-22: 20 mg via INTRAVENOUS
  Administered 2024-02-22: 150 ug/kg/min via INTRAVENOUS

## 2024-02-22 MED ORDER — INSULIN ASPART 100 UNIT/ML IJ SOLN: 0.0000 [IU] | INTRAMUSCULAR | Status: DC | PRN

## 2024-02-22 MED ORDER — ALBUTEROL SULFATE (2.5 MG/3ML) 0.083% IN NEBU
10.0000 mg | INHALATION_SOLUTION | Freq: Once | RESPIRATORY_TRACT | Status: AC
  Administered 2024-02-22: 10 mg via RESPIRATORY_TRACT
  Filled 2024-02-22: qty 12

## 2024-02-22 MED ORDER — SODIUM ZIRCONIUM CYCLOSILICATE 10 G PO PACK
10.0000 g | PACK | Freq: Two times a day (BID) | ORAL | Status: AC
  Administered 2024-02-22 – 2024-02-23 (×2): 10 g via ORAL
  Filled 2024-02-22 (×2): qty 1

## 2024-02-22 MED ORDER — ORAL CARE MOUTH RINSE: 15.0000 mL | Freq: Once | OROMUCOSAL | Status: AC

## 2024-02-22 MED ORDER — HEPARIN 6000 UNIT IRRIGATION SOLUTION
Status: DC | PRN
  Administered 2024-02-22: 1

## 2024-02-22 MED ORDER — CHLORHEXIDINE GLUCONATE 0.12 % MT SOLN
OROMUCOSAL | Status: AC
  Administered 2024-02-22: 15 mL via OROMUCOSAL
  Filled 2024-02-22: qty 15

## 2024-02-22 MED ORDER — CHLORHEXIDINE GLUCONATE 0.12 % MT SOLN: 15.0000 mL | Freq: Once | OROMUCOSAL | Status: AC

## 2024-02-22 MED ORDER — DIPHENHYDRAMINE HCL 25 MG PO CAPS
25.0000 mg | ORAL_CAPSULE | Freq: Once | ORAL | Status: AC
  Administered 2024-02-22: 25 mg via ORAL
  Filled 2024-02-22 (×2): qty 1

## 2024-02-22 MED ORDER — INSULIN ASPART 100 UNIT/ML IV SOLN
5.0000 [IU] | Freq: Once | INTRAVENOUS | Status: AC
  Administered 2024-02-22: 5 [IU] via INTRAVENOUS

## 2024-02-22 MED ORDER — SODIUM CHLORIDE 0.9% IV SOLUTION: Freq: Once | INTRAVENOUS | Status: AC

## 2024-02-22 MED ORDER — SODIUM CHLORIDE (PF) 0.9 % IJ SOLN
INTRAVENOUS | Status: DC | PRN
  Administered 2024-02-22: 4 mL via INTRAMUSCULAR

## 2024-02-22 MED ORDER — BENZONATATE 100 MG PO CAPS
200.0000 mg | ORAL_CAPSULE | Freq: Three times a day (TID) | ORAL | Status: DC | PRN
  Administered 2024-02-22 – 2024-02-23 (×2): 200 mg via ORAL
  Filled 2024-02-22 (×2): qty 2

## 2024-02-22 MED ORDER — CALCIUM GLUCONATE-NACL 1-0.675 GM/50ML-% IV SOLN
1.0000 g | Freq: Once | INTRAVENOUS | Status: AC
  Administered 2024-02-22: 1000 mg via INTRAVENOUS
  Filled 2024-02-22: qty 50

## 2024-02-22 MED ORDER — LIDOCAINE HCL 1 % IJ SOLN
INTRAMUSCULAR | Status: AC
  Filled 2024-02-22: qty 20

## 2024-02-22 SURGICAL SUPPLY — 59 items
BAG COUNTER SPONGE SURGICOUNT (BAG) ×1 IMPLANT
BAG SNAP BAND KOVER 36X36 (MISCELLANEOUS) ×1 IMPLANT
BLADE SURG 11 STRL SS (BLADE) ×1 IMPLANT
BNDG ELASTIC 4X5.8 VLCR STR LF (GAUZE/BANDAGES/DRESSINGS) IMPLANT
CANISTER SUCT 3000ML PPV (MISCELLANEOUS) ×1 IMPLANT
CATH ANGIO 5F BER2 65CM (CATHETERS) ×1 IMPLANT
CATH OMNI FLUSH .035X70CM (CATHETERS) IMPLANT
CLIP LIGATING EXTRA MED SLVR (CLIP) ×1 IMPLANT
CLIP LIGATING EXTRA SM BLUE (MISCELLANEOUS) ×1 IMPLANT
COVER DOME SNAP 22 D (MISCELLANEOUS) ×1 IMPLANT
COVER PROBE W GEL 5X96 (DRAPES) ×1 IMPLANT
COVER SURGICAL LIGHT HANDLE (MISCELLANEOUS) ×1 IMPLANT
DERMABOND ADVANCED .7 DNX12 (GAUZE/BANDAGES/DRESSINGS) ×1 IMPLANT
DRAIN CHANNEL 15F RND FF W/TCR (WOUND CARE) IMPLANT
DRAPE X-RAY CASS 24X20 (DRAPES) IMPLANT
ELECT REM PT RETURN 9FT ADLT (ELECTROSURGICAL) IMPLANT
ELECTRODE REM PT RTRN 9FT ADLT (ELECTROSURGICAL) IMPLANT
EVACUATOR SILICONE 100CC (DRAIN) IMPLANT
FILTER VC CELECT-FEMORAL (Filter) IMPLANT
GLOVE BIO SURGEON STRL SZ7.5 (GLOVE) ×1 IMPLANT
GOWN STRL REUS W/ TWL LRG LVL3 (GOWN DISPOSABLE) ×2 IMPLANT
GOWN STRL REUS W/ TWL XL LVL3 (GOWN DISPOSABLE) ×1 IMPLANT
HEMOSTAT SNOW SURGICEL 2X4 (HEMOSTASIS) IMPLANT
KIT BASIN OR (CUSTOM PROCEDURE TRAY) ×1 IMPLANT
KIT TURNOVER KIT B (KITS) ×1 IMPLANT
NDL PERC 18GX7CM (NEEDLE) ×1 IMPLANT
NEEDLE PERC 18GX7CM (NEEDLE) IMPLANT
NS IRRIG 1000ML POUR BTL (IV SOLUTION) IMPLANT
PACK CV ACCESS (CUSTOM PROCEDURE TRAY) IMPLANT
PACK ENDO MINOR (CUSTOM PROCEDURE TRAY) IMPLANT
PACK GENERAL/GYN (CUSTOM PROCEDURE TRAY) ×1 IMPLANT
PACK PERIPHERAL VASCULAR (CUSTOM PROCEDURE TRAY) IMPLANT
PAD ARMBOARD POSITIONER FOAM (MISCELLANEOUS) ×2 IMPLANT
PADDING CAST ABS COTTON 6X4 NS (CAST SUPPLIES) IMPLANT
POWDER SURGICEL 3.0 GRAM (HEMOSTASIS) IMPLANT
SET COLLECT BLD 21X3/4 12 (NEEDLE) IMPLANT
SET MICROPUNCTURE 5F STIFF (MISCELLANEOUS) IMPLANT
SHEATH AVANTI 11CM 5FR (SHEATH) ×1 IMPLANT
STOPCOCK 4 WAY LG BORE MALE ST (IV SETS) IMPLANT
STOPCOCK MORSE 400PSI 3WAY (MISCELLANEOUS) ×1 IMPLANT
SUT ETHILON 3 0 PS 1 (SUTURE) IMPLANT
SUT MNCRL AB 4-0 PS2 18 (SUTURE) IMPLANT
SUT PROLENE 5 0 C 1 24 (SUTURE) IMPLANT
SUT PROLENE 6 0 BV (SUTURE) IMPLANT
SUT PROLENE 7 0 BV 1 (SUTURE) IMPLANT
SUT SILK 2 0 SH (SUTURE) IMPLANT
SUT VIC AB 2-0 CT1 TAPERPNT 27 (SUTURE) IMPLANT
SUT VIC AB 3-0 SH 27X BRD (SUTURE) IMPLANT
SYR 10ML LL (SYRINGE) ×1 IMPLANT
SYR 20ML LL LF (SYRINGE) ×1 IMPLANT
SYR 30ML LL (SYRINGE) ×1 IMPLANT
SYR MEDRAD MARK V 150ML (SYRINGE) IMPLANT
TOWEL GREEN STERILE (TOWEL DISPOSABLE) ×1 IMPLANT
TRAY FOLEY MTR SLVR 16FR STAT (SET/KITS/TRAYS/PACK) IMPLANT
TUBING EXTENTION W/L.L. (IV SETS) IMPLANT
TUBING HIGH PRESSURE 120CM (CONNECTOR) ×1 IMPLANT
UNDERPAD 30X36 HEAVY ABSORB (UNDERPADS AND DIAPERS) IMPLANT
WATER STERILE IRR 1000ML POUR (IV SOLUTION) IMPLANT
WIRE BENTSON .035X145CM (WIRE) ×1 IMPLANT

## 2024-02-22 NOTE — Progress Notes (Signed)
  Echocardiogram 2D Echocardiogram has been performed.  Leda Roys RDCS 02/22/2024, 10:08 AM

## 2024-02-22 NOTE — Anesthesia Postprocedure Evaluation (Signed)
 Anesthesia Post Note  Patient: Jerry [name].  Procedure(s) Performed: INSERTION, UMBRELLA FILTER, INFERIOR VENA CAVA (Right: Groin)     Patient location during evaluation: PACU Anesthesia Type: MAC Level of consciousness: awake Pain management: pain level controlled Vital Signs Assessment: post-procedure vital signs reviewed and stable Respiratory status: spontaneous breathing, nonlabored ventilation and respiratory function stable Cardiovascular status: stable and blood pressure returned to baseline Postop Assessment: no apparent nausea or vomiting Anesthetic complications: no   No notable events documented.  Last Vitals:  Vitals:   02/22/24 1315 02/22/24 1335  BP: 131/66 124/78  Pulse: 91 94  Resp: 19 14  Temp: 36.9 C 36.4 C  SpO2: 97% 99%    Last Pain:  Vitals:   02/22/24 1335  TempSrc: Temporal  PainSc: 0-No pain                 Linton Rump

## 2024-02-22 NOTE — Progress Notes (Signed)
 [facility] Gastroenterology Progress Note  Jerry [name]. 82 y.o. 1941-07-03  CC: Anemia, heme positive stool   Subjective: This is a 83 year old patient known to GI services from recent admission.  Was admitted to the hospital earlier in April was diagnosed with DVT and PE.  Was subsequently started on heparin drip and was switched to oral anticoagulation after not having any bleeding during hospitalization.  Was discharged on February 18, 2024.  Presented back yesterday with black and maroon-colored stool.  Found to have drop in hemoglobin. Patient seen and examined at bedside.  He is complaining of black tarry stool as well as maroon-colored stool for last few days.  Denies any abdominal pain.  Denies any nausea or vomiting.  ROS : Afebrile, negative for nausea and vomiting   Objective: Vital signs in last 24 hours: Vitals:   02/22/24 0916 02/22/24 0922  BP:    Pulse:    Resp:    Temp:    SpO2: 97% 97%    Physical Exam: Elderly patient, sitting comfortably.  Not in acute distress.  Abdominal exam benign.  Alert and oriented x 3.  Mood and affect normal.  Lab Results: Recent Labs    02/21/24 1755 02/22/24 0029  NA 141 140  K 6.0* 6.3*  CL 109 112*  CO2 23 22  GLUCOSE 130* 141*  BUN 65* 65*  CREATININE 2.78* 2.59*  CALCIUM 8.5* 8.2*   Recent Labs    02/21/24 1755  AST 21  ALT 21  ALKPHOS 37*  BILITOT 0.4  PROT 5.0*  ALBUMIN 2.5*   Recent Labs    02/21/24 1755 02/22/24 0029  WBC 10.5  --   NEUTROABS 6.7  --   HGB 6.2* 5.5*  HCT 23.1* 18.8*  MCV 81.1  --   PLT 178  --    Recent Labs    02/21/24 1755  LABPROT 18.9*  INR 1.6*      Assessment/Plan: - Acute GI bleed with melena and maroon-colored stool.  Likely brisk upper GI bleed in setting of anticoagulation use.  Last use of anticoagulation around yesterday or 2 days ago   -Acute blood loss anemia.  Hemoglobin down to 5.5.  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  Was found to have a fair prep and internal hemorrhoids.  -Newly diagnosed pulmonary embolism. 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  Thought to be supply/demand mismatch.  Recommendations ------------------------ - Patient has received 3 units of blood transfusion overnight.  Case discussed with hospitalist Dr. [name].  Repeat CBC is pending. - Plan for urgent EGD today  - Unfortunately, bleeding from AVMs may not be seen with CT angio and our best course of action would be to do endoscopic evaluation and treatment.  Risks (bleeding, infection, bowel perforation that could require surgery, sedation-related changes in cardiopulmonary systems), benefits (identification and possible treatment of source of symptoms, exclusion of certain causes of symptoms), and alternatives (watchful waiting, radiographic imaging studies, empiric medical treatment)  were explained to patient/family in detail and patient wishes to proceed.     Kathi Der MD, FACP 02/22/2024, 9:31 AM  Contact #  [phone]

## 2024-02-22 NOTE — Plan of Care (Signed)

## 2024-02-22 NOTE — Anesthesia Postprocedure Evaluation (Signed)
 Anesthesia Post Note  Patient: Jerry [name].  Procedure(s) Performed: EGD (ESOPHAGOGASTRODUODENOSCOPY)     Patient location during evaluation: PACU Anesthesia Type: MAC Level of consciousness: awake Pain management: pain level controlled Vital Signs Assessment: post-procedure vital signs reviewed and stable Respiratory status: spontaneous breathing, nonlabored ventilation and respiratory function stable Cardiovascular status: stable and blood pressure returned to baseline Postop Assessment: no apparent nausea or vomiting Anesthetic complications: no   No notable events documented.  Last Vitals:  Vitals:   02/22/24 2100 02/22/24 2145  BP:    Pulse:    Resp:    Temp:    SpO2: 100% 100%    Last Pain:  Vitals:   02/22/24 2055  TempSrc: Oral  PainSc: 0-No pain                 Linton Rump

## 2024-02-22 NOTE — Interval H&P Note (Signed)
 History and Physical Interval Note:  02/22/2024 11:30 AM  Jerry [name].  has presented today for surgery, with the diagnosis of Deep venous thrombous.  The various methods of treatment have been discussed with the patient and family. After consideration of risks, benefits and other options for treatment, the patient has consented to  Procedure(s): INSERTION, UMBRELLA FILTER, INFERIOR VENA CAVA (N/A) as a surgical intervention.  The patient's history has been reviewed, patient examined, no change in status, stable for surgery.  I have reviewed the patient's chart and labs.  Questions were answered to the patient's satisfaction.     Lemar Livings

## 2024-02-22 NOTE — Brief Op Note (Signed)
 02/22/2024  2:29 PM  PATIENT:  Jerry [name].  83 y.o. male  PRE-OPERATIVE DIAGNOSIS:  GI bleed  POST-OPERATIVE DIAGNOSIS:  normal egd no bleeding noted  PROCEDURE:  Procedure(s): EGD (ESOPHAGOGASTRODUODENOSCOPY) (N/A)  SURGEON:  Surgeons and Role:    * Ameisha Mcclellan, MD - Primary  Findings ------------ - EGD negative for any evidence of active bleeding.  Recommendations ------------------------- - Start clear liquid diet -Monitor H&H -Melena could be from right colonic bleed.  Consider colonoscopy over the weekend if having ongoing bleeding. -GI will follow  Kathi Der MD, FACP 02/22/2024, 2:30 PM  Contact #  [phone]

## 2024-02-22 NOTE — Anesthesia Preprocedure Evaluation (Signed)
 Anesthesia Evaluation  Patient identified by MRN, date of birth, ID band Patient awake    Reviewed: Allergy & Precautions, NPO status , Patient's Chart, lab work & pertinent test results  History of Anesthesia Complications Negative for: history of anesthetic complications  Airway Mallampati: III  TM Distance: >3 FB Neck ROM: Full    Dental  (+) Dental Advisory Given   Pulmonary neg shortness of breath, asthma , sleep apnea , neg COPD, neg recent URI, PE Chronic hypoxic respiratory failure on 3 L of oxygen at baseline   Pulmonary exam normal breath sounds clear to auscultation       Cardiovascular hypertension, Pt. 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  male with history of HTN, HLD, DM-2, C Kd stage IIIb, nonobstructive CAD-managed medically, chronic microcytic anemia-likely due to intermittent GI bleeding in  the setting of colonic/gastric AVMs, vascular dementia-who was recently hospitalized from 4/4-4/7 for PE/DVT-started on anticoagulation-presented with black/maroon-colored stools-found to have acute blood loss anemia, AKI on CT  kidney stage IIIb along with hyperkalemia.  Hgb 5.5 s/p 3 units pRBCs  K 6.3  Reproductive/Obstetrics                              Anesthesia Physical Anesthesia Plan  ASA: 4  Anesthesia Plan: MAC   Post-op Pain Management: Precedex   Induction: Intravenous  PONV Risk Score and Plan: 1 and Treatment may vary due to age or medical condition, Propofol infusion and TIVA  Airway Management Planned: Natural Airway and Simple Face Mask  Additional Equipment:   Intra-op Plan:   Post-operative Plan:   Informed Consent: I have reviewed the patients History and Physical, chart, labs and discussed the procedure including the risks, benefits and alternatives for the proposed anesthesia with the patient or authorized representative who has indicated his/her understanding and acceptance.     Dental advisory given  Plan Discussed with: CRNA and Anesthesiologist  Anesthesia Plan Comments: (Discussed with patient risks of MAC including, but not limited to, minor pain or discomfort, hearing people in the room, and possible need for backup general anesthesia. Risks for general anesthesia also discussed including, but not limited to, sore throat, hoarse voice, chipped/damaged teeth, injury to vocal cords, nausea and vomiting, allergic reactions, lung infection, heart attack, stroke, and death. All questions answered.  Consent obtained prior to patient's IVC filter placement procedure earlier today. )         Anesthesia Quick Evaluation

## 2024-02-22 NOTE — Plan of Care (Signed)

## 2024-02-22 NOTE — Progress Notes (Signed)
 Repeat H&H showing hemoglobin 5.5. -Transfusing total 3 units of blood tonight.  Will follow-up with H&H and transfuse as needed goal to keep hemoglobin above 8.

## 2024-02-22 NOTE — Op Note (Signed)
    Patient name: Jerry [name]. MRN: [id number] DOB: 1941-03-24 Sex: male  02/22/2024 Pre-operative Diagnosis: dvt with contraindication to anticoagulation Post-operative diagnosis:  Same Surgeon:  Luanna Salk. Randie Heinz, MD Procedure Performed: 1.  Percutaneous ultrasound-guided access right common femoral vein 2.  Placement of infrarenal Cook Celect IVC filter with IVC venogram   Indications: 83 year old male with recent diagnosis of DVT and PE requiring anticoagulation.  He is subsequently found to have a GI bleed not requiring transfusion and is contraindicated to continue anticoagulation and is indicated for IVC filter.  Findings: Bilateral renal veins were identified with IVC venogram and the IVC was patent.  The filter was placed just below the bilateral renal veins.   Procedure:  The patient was identified in the holding area and taken to the operating room where MAC anesthesia was induced.  He was sterilely prepped and draped in the right groin in the usual fashion, timeout was called.  Ultrasound was used to identify what was a compressible common femoral vein we identified the saphenofemoral junction and the area was anesthetized 1% lidocaine and cannulated with a micropuncture needle followed by wire and a micropuncture sheath.  A Bentson wire was placed under fluoroscopic guidance and the introducer sheath was placed up to the approximate level of L2-L3 and central venogram was performed.  We then placed the sheath over the dilator up to the level of the renal veins which were marked on the screen and the filter was deployed.  Given history of spinal instrumentation we then rotated LAO and performed single shot fluoroscopy as completion imaging.  Sheath was removed and pressure was held.  The patient tolerated the procedure without immediate complication.  Contrast: 4 cc   Shadara Lopez C. Randie Heinz, MD [facility] Office: [phone] Pager: [phone]

## 2024-02-22 NOTE — Progress Notes (Signed)
 PROGRESS NOTE        PATIENT DETAILS Name: Jerry [name]. Age: 83 y.o. Sex: male Date of Birth: 28-Jan-1941 Admit Date: 02/21/2024 Admitting Physician Tereasa Coop, MD ZOX:[id number], Cala Bradford, MD  Brief Summary: Patient is a 83 y.o.  male with history of HTN, HLD, DM-2, C Kd stage IIIb, nonobstructive CAD-managed medically, chronic microcytic anemia-likely due to intermittent GI bleeding in the setting of colonic/gastric AVMs, vascular dementia-who was recently hospitalized from 4/4-4/7 for PE/DVT-started on anticoagulation-presented with black/maroon-colored stools-found to have acute blood loss anemia, AKI on CT kidney stage IIIb along with hyperkalemia.  Significant events: 4/10>> admit to [facility]-GI bleed-AKI/hyperkalemia.  Significant studies: 4/04>> CTA chest: B/L lower lobe segmental PE. 4/06>> B/L lower Doppler: Acute DVT left popliteal vein 4/10>> CXR: No PNA  Significant microbiology data: None  Procedures: None  Consults: GI  Subjective: 1-2 black-colored stools overnight.  None this morning.  Transfuse 3 units overnight-last transfusion completed around 6:30 AM.  Await/alert-no abdominal pain.  Spouse at bedside.  Objective: Vitals: Blood pressure (!) 95/53, pulse 99, temperature 98.6 F (37 C), temperature source Oral, resp. rate 17, SpO2 95%.   Exam: Gen Exam:Alert awake-not in any distress HEENT:atraumatic, normocephalic Chest: B/L clear to auscultation anteriorly CVS:S1S2 regular Abdomen:soft non tender, non distended Extremities:no edema Neurology: Non focal Skin: no rash  Pertinent Labs/Radiology:    Latest Ref Rng & Units 02/22/2024   12:29 AM 02/21/2024    5:55 PM 02/18/2024    3:02 AM  CBC  WBC 4.0 - 10.5 K/uL  10.5  6.8   Hemoglobin 13.0 - 17.0 g/dL 5.5  6.2  8.1   Hematocrit 39.0 - 52.0 % 18.8  23.1  28.2   Platelets 150 - 400 K/uL  178  208     Lab Results  Component Value Date   NA 140 02/22/2024   K 6.3 (HH)  02/22/2024   CL 112 (H) 02/22/2024   CO2 22 02/22/2024      Assessment/Plan: Presumed upper GI bleed with acute blood loss anemia and hypovolemic shock in the setting of recent initiation of anticoagulation for DVT/PE. Has been resuscitated with IVF and 3 units of PRBC-BP now stable this morning Last melanotic appearing stool per night RN was sometime late last night-no active bleeding this morning. Continue PPI Eliquis on hold-Per spouse last dose was apparently on 4/9 evening Await posttransfusion CBC Keep n.p.o. Spoke with [facility] Dr. Rosalita Chessman will follow and make further recommendations regarding endoscopy evaluation.  AKI on CKD stage IIIb Hemodynamically mediated due to GI bleed/hypotension BP now stable  Hyperkalemia Due to worsening AKI Has been treated with insulin/D50/Lokelma Awaiting repeat electrolyte panel  Lactic acidosis Due to hypoperfusion from GI bleeding-sepsis ruled out.  HTN BP soft but stable Metoprolol on hold  History of nonobstructive CAD No anginal symptoms-attempt to keep Hb> 8 Aspirin on hold.  Recent PE and left popliteal DVT Unable to anticoagulate Will discuss with vascular surgery regarding possible filter placement (recent Doppler on 4/6 positive for popliteal DVT)  Asthma without exacerbation Pulmonary hypertension Chronic hypoxic respiratory failure on 3 L of oxygen at baseline Continue bronchodilators Stable on 3 L of oxygen  DM-2 (A1c 6.2 on 2/13) CBGs stable on SSI  HLD Statin   Dementia Mood disorder Delirium precautions Continue Namenda/olanzapine  BMI: Estimated body mass index is 23.58 kg/m as calculated from  the following:   Height as of 02/15/24: 5\' 11"  (1.803 m).   Weight as of 02/18/24: 76.7 kg.   Code status:   Code Status: Full Code   DVT Prophylaxis: SCDs Start: 02/21/24 1921 Place TED hose Start: 02/21/24 1921   Family Communication: Spouse at bedside   Disposition Plan: Status is:  Inpatient Remains inpatient appropriate because: Severity of illness   Planned Discharge Destination:Home   Diet: Diet Order             Diet NPO time specified Except for: Sips with Meds, Ice Chips  Diet effective now                     Antimicrobial agents: Anti-infectives (From admission, onward)    None        MEDICATIONS: Scheduled Meds:  sodium chloride   Intravenous Once   atorvastatin  20 mg Oral Daily   escitalopram  5 mg Oral Daily   fluticasone furoate-vilanterol  1 puff Inhalation BID   insulin aspart  0-5 Units Subcutaneous QHS   insulin aspart  0-6 Units Subcutaneous TID WC   memantine  5 mg Oral BID   pantoprazole (PROTONIX) IV  40 mg Intravenous Q12H   sodium chloride flush  3 mL Intravenous Q12H   sodium zirconium cyclosilicate  10 g Oral Once   Continuous Infusions:  sodium chloride     calcium gluconate     lactated ringers 150 mL/hr at 02/22/24 0305   PRN Meds:.sodium chloride, acetaminophen **OR** acetaminophen, albuterol, ALPRAZolam, OLANZapine, ondansetron **OR** ondansetron (ZOFRAN) IV, sodium chloride flush   I have personally reviewed following labs and imaging studies  LABORATORY DATA: CBC: Recent Labs  Lab 02/16/24 0250 02/16/24 0852 02/17/24 0246 02/17/24 1845 02/18/24 0302 02/21/24 1755 02/22/24 0029  WBC 7.0  --  4.7  --  6.8 10.5  --   NEUTROABS  --   --   --   --   --  6.7  --   HGB 7.9*   < > 8.0* 8.9* 8.1* 6.2* 5.5*  HCT 28.6*   < > 28.5* 31.4* 28.2* 23.1* 18.8*  MCV 77.9*  --  76.0*  --  76.8* 81.1  --   PLT 215  --  214  --  208 178  --    < > = values in this interval not displayed.    Basic Metabolic Panel: Recent Labs  Lab 02/15/24 1701 02/16/24 0250 02/17/24 0246 02/18/24 0302 02/21/24 1755 02/22/24 0029  NA  --  139 142 141 141 140  K  --  5.6* 4.5 4.6 6.0* 6.3*  CL  --  106 107 106 109 112*  CO2  --  27 28 27 23 22   GLUCOSE  --  119* 79 151* 130* 141*  BUN  --  39* 33* 33* 65* 65*   CREATININE  --  1.85* 1.93* 2.23* 2.78* 2.59*  CALCIUM  --  8.8* 8.8* 8.5* 8.5* 8.2*  MG 1.6* 1.6* 2.0  --   --   --     GFR: Estimated Creatinine Clearance: 24.2 mL/min (A) (by C-G formula based on SCr of 2.59 mg/dL (H)).  Liver Function Tests: Recent Labs  Lab 02/15/24 1701 02/16/24 0250 02/21/24 1755  AST 23 16 21   ALT 21 19 21   ALKPHOS 44 43 37*  BILITOT 0.4 0.4 0.4  PROT 5.5* 5.2* 5.0*  ALBUMIN 2.7* 2.6* 2.5*   No results for input(s): "LIPASE", "AMYLASE" in the  last 168 hours. No results for input(s): "AMMONIA" in the last 168 hours.  Coagulation Profile: Recent Labs  Lab 02/21/24 1755  INR 1.6*    Cardiac Enzymes: No results for input(s): "CKTOTAL", "CKMB", "CKMBINDEX", "TROPONINI" in the last 168 hours.  BNP (last 3 results) Recent Labs    12/27/23 1105  PROBNP 1,952*    Lipid Profile: No results for input(s): "CHOL", "HDL", "LDLCALC", "TRIG", "CHOLHDL", "LDLDIRECT" in the last 72 hours.  Thyroid Function Tests: No results for input(s): "TSH", "T4TOTAL", "FREET4", "T3FREE", "THYROIDAB" in the last 72 hours.  Anemia Panel: No results for input(s): "VITAMINB12", "FOLATE", "FERRITIN", "TIBC", "IRON", "RETICCTPCT" in the last 72 hours.  Urine analysis:    Component Value Date/Time   COLORURINE YELLOW 06/26/2023 1520   APPEARANCEUR CLOUDY (A) 06/26/2023 1520   LABSPEC 1.009 06/26/2023 1520   PHURINE 7.0 06/26/2023 1520   GLUCOSEU NEGATIVE 06/26/2023 1520   HGBUR SMALL (A) 06/26/2023 1520   BILIRUBINUR NEGATIVE 06/26/2023 1520   KETONESUR NEGATIVE 06/26/2023 1520   PROTEINUR NEGATIVE 06/26/2023 1520   UROBILINOGEN 0.2 04/17/2015 1820   NITRITE NEGATIVE 06/26/2023 1520   LEUKOCYTESUR LARGE (A) 06/26/2023 1520    Sepsis Labs: Lactic Acid, Venous    Component Value Date/Time   LATICACIDVEN 4.0 (HH) 02/22/2024 0029    MICROBIOLOGY: Recent Results (from the past 240 hours)  Resp panel by RT-PCR (RSV, Flu A&B, Covid) Anterior Nasal Swab      Status: None   Collection Time: 02/15/24  4:43 AM   Specimen: Anterior Nasal Swab  Result Value Ref Range Status   SARS Coronavirus 2 by RT PCR NEGATIVE NEGATIVE Final   Influenza A by PCR NEGATIVE NEGATIVE Final   Influenza B by PCR NEGATIVE NEGATIVE Final    Comment: (NOTE) The Xpert Xpress SARS-CoV-2/FLU/RSV plus assay is intended as an aid in the diagnosis of influenza from Nasopharyngeal swab specimens and should not be used as a sole basis for treatment. Nasal washings and aspirates are unacceptable for Xpert Xpress SARS-CoV-2/FLU/RSV testing.  Fact Sheet for Patients: BloggerCourse.com  Fact Sheet for Healthcare Providers: SeriousBroker.it  This test is not yet approved or cleared by the [place] FDA and has been authorized for detection and/or diagnosis of SARS-CoV-2 by FDA under an Emergency Use Authorization (EUA). This EUA will remain in effect (meaning this test can be used) for the duration of the COVID-19 declaration under Section 564(b)(1) of the Act, 21 U.S.C. section 360bbb-3(b)(1), unless the authorization is terminated or revoked.     Resp Syncytial Virus by PCR NEGATIVE NEGATIVE Final    Comment: (NOTE) Fact Sheet for Patients: BloggerCourse.com  Fact Sheet for Healthcare Providers: SeriousBroker.it  This test is not yet approved or cleared by the [place] FDA and has been authorized for detection and/or diagnosis of SARS-CoV-2 by FDA under an Emergency Use Authorization (EUA). This EUA will remain in effect (meaning this test can be used) for the duration of the COVID-19 declaration under Section 564(b)(1) of the Act, 21 U.S.C. section 360bbb-3(b)(1), unless the authorization is terminated or revoked.  Performed at [facility], [address] [place], [place] [address]     RADIOLOGY STUDIES/RESULTS: DG Chest Port 1 View Result  Date: 02/21/2024 CLINICAL DATA:  GI bleed EXAM: PORTABLE CHEST 1 VIEW COMPARISON:  02/15/2024 FINDINGS: Probable small left effusion with mild atelectasis or infiltrate persisting at the left base. Stable cardiomediastinal silhouette. No pneumothorax IMPRESSION: Probable small left effusion with mild atelectasis or infiltrate at the left base. Electronically  Signed   By: Jasmine Pang M.D.   On: 02/21/2024 20:33     LOS: 1 day   Jeoffrey Massed, MD  [facility]    To contact the attending provider between 7A-7P or the covering provider during after hours 7P-7A, please log into the web site [email or web address] and access using universal [place] password for that web site. If you do not have the password, please call the hospital operator.  02/22/2024, 9:02 AM

## 2024-02-22 NOTE — Consult Note (Addendum)
 Hospital Consult    Reason for Consult:  in need of IVC filter Requesting Physician:  [name] MRN #:  [id number]  History of Present Illness: This is a 83 y.o. male with hx of PE and DVT.  He has been found to have GIB with acute blood loss anemia requiring transfusion.  Vascular surgery consulted for IVC filter placement. He had BLE duplex on 02/17/2024 revealing acute DVT in the left popliteal vein.  Overnight, he had a potassium of 6.3, hgb 5.5, lactic acid of 4.0 and creatinine of 2.59 (hx CKD).  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   Malnutrition of moderate degree 06/28/2023   Mixed hyperlipidemia 07/21/2021   OSA (obstructive sleep apnea) 07/12/2021   NPSG- 03/16/21- AHI 10.3/ hr, desaturation to 87%, body weight 190 lbs     Renal insufficiency 04/17/2015   Syncope 04/17/2015   Tremor 07/21/2021   Vascular dementia (HCC)    Vitamin D deficiency 07/21/2021    Past Surgical History:  Procedure Laterality Date   BACK SURGERY     BIOPSY  10/09/2023   Procedure: BIOPSY;  Surgeon: Kathi Der, MD;  Location: [facility];  Service: Gastroenterology;;   CHOLECYSTECTOMY     COLONOSCOPY WITH PROPOFOL Bilateral 10/09/2023   Procedure: COLONOSCOPY WITH PROPOFOL;  Surgeon: Kathi Der, MD;  Location: [facility];  Service: Gastroenterology;  Laterality: Bilateral;   ESOPHAGOGASTRODUODENOSCOPY (EGD) WITH PROPOFOL Bilateral 10/09/2023   Procedure: ESOPHAGOGASTRODUODENOSCOPY (EGD) WITH PROPOFOL;   Surgeon: Kathi Der, MD;  Location: [facility];  Service: Gastroenterology;  Laterality: Bilateral;   HOT HEMOSTASIS N/A 10/09/2023   Procedure: HOT HEMOSTASIS (ARGON PLASMA COAGULATION/BICAP);  Surgeon: Kathi Der, MD;  Location: [facility];  Service: Gastroenterology;  Laterality: N/A;   KNEE SURGERY Left    LOWER EXTREMITY VENOGRAPHY  01/29/2024   Procedure: LOWER EXTREMITY VENOGRAPHY;  Surgeon: Swaziland, Peter M, MD;  Location: [facility] INVASIVE CV LAB;  Service: Cardiovascular;;   RIGHT/LEFT HEART CATH AND CORONARY ANGIOGRAPHY N/A 01/29/2024   Procedure: RIGHT/LEFT HEART CATH AND CORONARY ANGIOGRAPHY;  Surgeon: Swaziland, Peter M, MD;  Location: [facility] INVASIVE CV LAB;  Service: Cardiovascular;  Laterality: N/A;   SHOULDER SURGERY     for rotator cuff tear    No Known Allergies  Prior to Admission medications   Medication Sig Start Date End Date Taking? Authorizing Provider  albuterol (VENTOLIN HFA) 108 (90 Base) MCG/ACT inhaler Inhale 2 puffs into the lungs every 6 (six) hours as needed for wheezing or shortness of breath. 07/12/21  Yes Young, Joni Fears D, MD  ALPRAZolam Prudy Feeler) 0.5 MG tablet Take 0.5 mg by mouth 2 (two) times daily as needed for anxiety.   Yes [provider]  amoxicillin-clavulanate (AUGMENTIN) 875-125 MG tablet Take 1 tablet by mouth 2 (two) times daily. 02/20/24  Yes [provider]  APIXABAN Everlene Balls) VTE STARTER PACK (10MG  AND 5MG ) Take as directed on package: start with two-5mg  tablets twice daily for 7 days. On day 8, switch to one-5mg  tablet twice daily. 02/18/24  Yes  Mdala-Gausi, Gwenette Greet, MD  aspirin EC 81 MG tablet Take 1 tablet (81 mg total) by mouth daily. Swallow whole. 02/04/24 03/05/24 Yes Lanae Boast, MD  atorvastatin (LIPITOR) 20 MG tablet Take 1 tablet (20 mg total) by mouth daily. 02/04/24 03/05/24 Yes Lanae Boast, MD  Chlorphen-PE-Acetaminophen 4-10-325 MG TABS Take 1 tablet by mouth daily as needed (For sinus). 02/20/24  Yes [provider]  Cholecalciferol 1000 UNITS tablet Take 1,000 Units by mouth daily.   Yes [provider]  Cyanocobalamin (VITAMIN B12 PO) Take 1 tablet by mouth daily.   Yes [provider]  DULERA 200-5 MCG/ACT AERO Inhale 2 puffs into the lungs 2 (two) times daily.   Yes [provider]  escitalopram (LEXAPRO) 5 MG tablet Take 1 tablet (5 mg total) by mouth daily. 06/06/23 01/26/25 Yes Earney Navy, NP  FeFum-FePoly-FA-B Cmp-C-Biot (FOLIVANE-PLUS) CAPS Take 1 capsule by mouth daily.   Yes [provider]  furosemide (LASIX) 40 MG tablet Take 0.5 tablets (20 mg total) by mouth daily. 02/18/24  Yes Mdala-Gausi, Gwenette Greet, MD  ipratropium-albuterol (DUONEB) 0.5-2.5 (3) MG/3ML SOLN Inhale 3 mLs into the lungs every 2 (two) hours as needed (SOB/Wheezing).   Yes [provider]  memantine (NAMENDA) 5 MG tablet Take 5 mg by mouth 2 (two) times daily. 02/17/24  Yes [provider]  metoprolol tartrate (LOPRESSOR) 50 MG tablet Take 1 tablet (50 mg total) by mouth 2 (two) times daily. 02/03/24 03/04/24 Yes Lanae Boast, MD  OLANZapine (ZYPREXA) 2.5 MG tablet Take 1 tablet (2.5 mg total) by mouth 2 (two) times daily as needed (agitation). 06/05/23  Yes Linwood Dibbles, MD  pantoprazole (PROTONIX) 40 MG tablet Take 1 tablet (40 mg total) by mouth 2 (two) times daily. 02/18/24  Yes Mdala-Gausi, Gwenette Greet, MD  apixaban (ELIQUIS) 5 MG TABS tablet Take 1 tablet (5 mg total) by mouth 2 (two) times daily. Patient not taking: Reported on 02/21/2024 03/19/24   Mdala-Gausi, Gwenette Greet, MD    Social History   Socioeconomic History   Marital status: Married    Spouse name: Not on file   Number of children: Not on file   Years of education: Not on file   Highest education level: Not on file  Occupational History   Not on file  Tobacco Use   Smoking status: Never   Smokeless tobacco: Never  Vaping Use   Vaping status: Never Used  Substance and Sexual  Activity   Alcohol use: Yes    Alcohol/week: 3.0 standard drinks of alcohol    Types: 3 Standard drinks or equivalent per week   Drug use: Yes    Types: Marijuana   Sexual activity: Not on file  Other Topics Concern   Not on file  Social History Narrative   Not on file   Social Drivers of Health   Financial Resource Strain: Not on file  Food Insecurity: No Food Insecurity (02/15/2024)   Hunger Vital Sign    Worried About Running Out of Food in the Last Year: Never true    Ran Out of Food in the Last Year: Never true  Transportation Needs: No Transportation Needs (02/15/2024)   PRAPARE - Administrator, Civil Service (Medical): No    Lack of Transportation (Non-Medical): No  Physical Activity: Not on file  Stress: Not on file  Social Connections: Socially Integrated (02/15/2024)   Social Connection and Isolation Panel [NHANES]    Frequency of Communication with Friends and  Family: Three times a week    Frequency of Social Gatherings with Friends and Family: Twice a week    Attends Religious Services: 1 to 4 times per year    Active Member of [employer] or Organizations: Yes    Attends Banker Meetings: 1 to 4 times per year    Marital Status: Married  Catering manager Violence: Not At Risk (02/15/2024)   Humiliation, Afraid, Rape, and Kick questionnaire    Fear of Current or Ex-Partner: No    Emotionally Abused: No    Physically Abused: No    Sexually Abused: No     Family History  Problem Relation Age of Onset   Diabetes Mother    Diabetes Father     ROS: [x]  Positive   [ ]  Negative   [ ]  All sytems reviewed and are negative  Cardiac: []  chest pain/pressure []  hx MI []  SOB   Vascular: [x]  hx of DVT []  swelling in legs  Pulmonary: [x]  OSA   Neurologic: [x]  vascular dementia   Hematologic: [x]  anemia  Endocrine:   []  diabetes []  thyroid disease  GI [x]  GIB  GU: [x]  CKD  Psychiatric: []  anxiety []   depression  Musculoskeletal: [x]  arthritis  Integumentary: []  rashes []  ulcers  Constitutional: []  fever  []  chills  Physical Examination  Vitals:   02/22/24 0916 02/22/24 0922  BP:    Pulse:    Resp:    Temp:    SpO2: 97% 97%     General:  WDWN in NAD Gait: Not observed HENT: WNL, normocephalic Pulmonary: normal non-labored breathing Neurologic: A&O X 3 Psychiatric:  The pt has Normal affect.   CBC    Component Value Date/Time   WBC 10.5 02/21/2024 1755   RBC 2.85 (L) 02/21/2024 1755   HGB 5.5 (LL) 02/22/2024 0029   HGB 11.2 (L) 12/27/2023 1107   HCT 18.8 (L) 02/22/2024 0029   HCT 38.8 12/27/2023 1107   PLT 178 02/21/2024 1755   PLT 310 12/27/2023 1107   MCV 81.1 02/21/2024 1755   MCV 73 (L) 12/27/2023 1107   MCH 21.8 (L) 02/21/2024 1755   MCHC 26.8 (L) 02/21/2024 1755   RDW 21.3 (H) 02/21/2024 1755   RDW 19.0 (H) 12/27/2023 1107   LYMPHSABS 2.5 02/21/2024 1755   MONOABS 0.8 02/21/2024 1755   EOSABS 0.3 02/21/2024 1755   BASOSABS 0.0 02/21/2024 1755    BMET    Component Value Date/Time   NA 140 02/22/2024 0029   NA 142 12/27/2023 1106   K 6.3 (HH) 02/22/2024 0029   CL 112 (H) 02/22/2024 0029   CO2 22 02/22/2024 0029   GLUCOSE 141 (H) 02/22/2024 0029   BUN 65 (H) 02/22/2024 0029   BUN 31 (H) 12/27/2023 1106   CREATININE 2.59 (H) 02/22/2024 0029   CALCIUM 8.2 (L) 02/22/2024 0029   GFRNONAA 24 (L) 02/22/2024 0029   GFRAA >60 04/18/2015 0407    COAGS: Lab Results  Component Value Date   INR 1.6 (H) 02/21/2024       ASSESSMENT/PLAN: This is a 83 y.o. male with DVT/PE now with GIB in need of IVC filter   -plan for OR with Dr. Randie Heinz this morning for IVC filter placement-on his way to pre op holding currently.  Pt without questions currently.  Wants to go to sleep for procedure.  -Dr. Randie Heinz to evaluate pt and determine further plan   Doreatha Massed, PA-C [facility] [phone]  VASCULAR STAFF ADDENDUM: I have  independently interviewed and examined the patient. I agree with the above.  I had a long conversation with both the patient and his wife regarding the above.  IVC filter is indicated due to the inability to tolerate anticoagulation.  After discussing the risks and benefits, Purl elected to proceed.  Victorino Sparrow MD [facility] Phone Number: [phone] 02/22/2024 11:06 AM

## 2024-02-22 NOTE — H&P (View-Only) (Signed)
 Hospital Consult    Reason for Consult:  in need of IVC filter Requesting Physician:  [name] MRN #:  [id number]  History of Present Illness: This is a 83 y.o. male with hx of PE and DVT.  He has been found to have GIB with acute blood loss anemia requiring transfusion.  Vascular surgery consulted for IVC filter placement. He had BLE duplex on 02/17/2024 revealing acute DVT in the left popliteal vein.  Overnight, he had a potassium of 6.3, hgb 5.5, lactic acid of 4.0 and creatinine of 2.59 (hx CKD).  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   Malnutrition of moderate degree 06/28/2023   Mixed hyperlipidemia 07/21/2021   OSA (obstructive sleep apnea) 07/12/2021   NPSG- 03/16/21- AHI 10.3/ hr, desaturation to 87%, body weight 190 lbs     Renal insufficiency 04/17/2015   Syncope 04/17/2015   Tremor 07/21/2021   Vascular dementia (HCC)    Vitamin D deficiency 07/21/2021    Past Surgical History:  Procedure Laterality Date   BACK SURGERY     BIOPSY  10/09/2023   Procedure: BIOPSY;  Surgeon: Kathi Der, MD;  Location: [facility];  Service: Gastroenterology;;   CHOLECYSTECTOMY     COLONOSCOPY WITH PROPOFOL Bilateral 10/09/2023   Procedure: COLONOSCOPY WITH PROPOFOL;  Surgeon: Kathi Der, MD;  Location: [facility];  Service: Gastroenterology;  Laterality: Bilateral;   ESOPHAGOGASTRODUODENOSCOPY (EGD) WITH PROPOFOL Bilateral 10/09/2023   Procedure: ESOPHAGOGASTRODUODENOSCOPY (EGD) WITH PROPOFOL;   Surgeon: Kathi Der, MD;  Location: [facility];  Service: Gastroenterology;  Laterality: Bilateral;   HOT HEMOSTASIS N/A 10/09/2023   Procedure: HOT HEMOSTASIS (ARGON PLASMA COAGULATION/BICAP);  Surgeon: Kathi Der, MD;  Location: [facility];  Service: Gastroenterology;  Laterality: N/A;   KNEE SURGERY Left    LOWER EXTREMITY VENOGRAPHY  01/29/2024   Procedure: LOWER EXTREMITY VENOGRAPHY;  Surgeon: Swaziland, Peter M, MD;  Location: [facility] INVASIVE CV LAB;  Service: Cardiovascular;;   RIGHT/LEFT HEART CATH AND CORONARY ANGIOGRAPHY N/A 01/29/2024   Procedure: RIGHT/LEFT HEART CATH AND CORONARY ANGIOGRAPHY;  Surgeon: Swaziland, Peter M, MD;  Location: [facility] INVASIVE CV LAB;  Service: Cardiovascular;  Laterality: N/A;   SHOULDER SURGERY     for rotator cuff tear    No Known Allergies  Prior to Admission medications   Medication Sig Start Date End Date Taking? Authorizing Provider  albuterol (VENTOLIN HFA) 108 (90 Base) MCG/ACT inhaler Inhale 2 puffs into the lungs every 6 (six) hours as needed for wheezing or shortness of breath. 07/12/21  Yes Young, Joni Fears D, MD  ALPRAZolam Prudy Feeler) 0.5 MG tablet Take 0.5 mg by mouth 2 (two) times daily as needed for anxiety.   Yes [provider]  amoxicillin-clavulanate (AUGMENTIN) 875-125 MG tablet Take 1 tablet by mouth 2 (two) times daily. 02/20/24  Yes [provider]  APIXABAN Everlene Balls) VTE STARTER PACK (10MG  AND 5MG ) Take as directed on package: start with two-5mg  tablets twice daily for 7 days. On day 8, switch to one-5mg  tablet twice daily. 02/18/24  Yes  Mdala-Gausi, Gwenette Greet, MD  aspirin EC 81 MG tablet Take 1 tablet (81 mg total) by mouth daily. Swallow whole. 02/04/24 03/05/24 Yes Lanae Boast, MD  atorvastatin (LIPITOR) 20 MG tablet Take 1 tablet (20 mg total) by mouth daily. 02/04/24 03/05/24 Yes Lanae Boast, MD  Chlorphen-PE-Acetaminophen 4-10-325 MG TABS Take 1 tablet by mouth daily as needed (For sinus). 02/20/24  Yes [provider]  Cholecalciferol 1000 UNITS tablet Take 1,000 Units by mouth daily.   Yes [provider]  Cyanocobalamin (VITAMIN B12 PO) Take 1 tablet by mouth daily.   Yes [provider]  DULERA 200-5 MCG/ACT AERO Inhale 2 puffs into the lungs 2 (two) times daily.   Yes [provider]  escitalopram (LEXAPRO) 5 MG tablet Take 1 tablet (5 mg total) by mouth daily. 06/06/23 01/26/25 Yes Earney Navy, NP  FeFum-FePoly-FA-B Cmp-C-Biot (FOLIVANE-PLUS) CAPS Take 1 capsule by mouth daily.   Yes [provider]  furosemide (LASIX) 40 MG tablet Take 0.5 tablets (20 mg total) by mouth daily. 02/18/24  Yes Mdala-Gausi, Gwenette Greet, MD  ipratropium-albuterol (DUONEB) 0.5-2.5 (3) MG/3ML SOLN Inhale 3 mLs into the lungs every 2 (two) hours as needed (SOB/Wheezing).   Yes [provider]  memantine (NAMENDA) 5 MG tablet Take 5 mg by mouth 2 (two) times daily. 02/17/24  Yes [provider]  metoprolol tartrate (LOPRESSOR) 50 MG tablet Take 1 tablet (50 mg total) by mouth 2 (two) times daily. 02/03/24 03/04/24 Yes Lanae Boast, MD  OLANZapine (ZYPREXA) 2.5 MG tablet Take 1 tablet (2.5 mg total) by mouth 2 (two) times daily as needed (agitation). 06/05/23  Yes Linwood Dibbles, MD  pantoprazole (PROTONIX) 40 MG tablet Take 1 tablet (40 mg total) by mouth 2 (two) times daily. 02/18/24  Yes Mdala-Gausi, Gwenette Greet, MD  apixaban (ELIQUIS) 5 MG TABS tablet Take 1 tablet (5 mg total) by mouth 2 (two) times daily. Patient not taking: Reported on 02/21/2024 03/19/24   Mdala-Gausi, Gwenette Greet, MD    Social History   Socioeconomic History   Marital status: Married    Spouse name: Not on file   Number of children: Not on file   Years of education: Not on file   Highest education level: Not on file  Occupational History   Not on file  Tobacco Use   Smoking status: Never   Smokeless tobacco: Never  Vaping Use   Vaping status: Never Used  Substance and Sexual  Activity   Alcohol use: Yes    Alcohol/week: 3.0 standard drinks of alcohol    Types: 3 Standard drinks or equivalent per week   Drug use: Yes    Types: Marijuana   Sexual activity: Not on file  Other Topics Concern   Not on file  Social History Narrative   Not on file   Social Drivers of Health   Financial Resource Strain: Not on file  Food Insecurity: No Food Insecurity (02/15/2024)   Hunger Vital Sign    Worried About Running Out of Food in the Last Year: Never true    Ran Out of Food in the Last Year: Never true  Transportation Needs: No Transportation Needs (02/15/2024)   PRAPARE - Administrator, Civil Service (Medical): No    Lack of Transportation (Non-Medical): No  Physical Activity: Not on file  Stress: Not on file  Social Connections: Socially Integrated (02/15/2024)   Social Connection and Isolation Panel [NHANES]    Frequency of Communication with Friends and  Family: Three times a week    Frequency of Social Gatherings with Friends and Family: Twice a week    Attends Religious Services: 1 to 4 times per year    Active Member of [employer] or Organizations: Yes    Attends Banker Meetings: 1 to 4 times per year    Marital Status: Married  Catering manager Violence: Not At Risk (02/15/2024)   Humiliation, Afraid, Rape, and Kick questionnaire    Fear of Current or Ex-Partner: No    Emotionally Abused: No    Physically Abused: No    Sexually Abused: No     Family History  Problem Relation Age of Onset   Diabetes Mother    Diabetes Father     ROS: [x]  Positive   [ ]  Negative   [ ]  All sytems reviewed and are negative  Cardiac: []  chest pain/pressure []  hx MI []  SOB   Vascular: [x]  hx of DVT []  swelling in legs  Pulmonary: [x]  OSA   Neurologic: [x]  vascular dementia   Hematologic: [x]  anemia  Endocrine:   []  diabetes []  thyroid disease  GI [x]  GIB  GU: [x]  CKD  Psychiatric: []  anxiety []   depression  Musculoskeletal: [x]  arthritis  Integumentary: []  rashes []  ulcers  Constitutional: []  fever  []  chills  Physical Examination  Vitals:   02/22/24 0916 02/22/24 0922  BP:    Pulse:    Resp:    Temp:    SpO2: 97% 97%     General:  WDWN in NAD Gait: Not observed HENT: WNL, normocephalic Pulmonary: normal non-labored breathing Neurologic: A&O X 3 Psychiatric:  The pt has Normal affect.   CBC    Component Value Date/Time   WBC 10.5 02/21/2024 1755   RBC 2.85 (L) 02/21/2024 1755   HGB 5.5 (LL) 02/22/2024 0029   HGB 11.2 (L) 12/27/2023 1107   HCT 18.8 (L) 02/22/2024 0029   HCT 38.8 12/27/2023 1107   PLT 178 02/21/2024 1755   PLT 310 12/27/2023 1107   MCV 81.1 02/21/2024 1755   MCV 73 (L) 12/27/2023 1107   MCH 21.8 (L) 02/21/2024 1755   MCHC 26.8 (L) 02/21/2024 1755   RDW 21.3 (H) 02/21/2024 1755   RDW 19.0 (H) 12/27/2023 1107   LYMPHSABS 2.5 02/21/2024 1755   MONOABS 0.8 02/21/2024 1755   EOSABS 0.3 02/21/2024 1755   BASOSABS 0.0 02/21/2024 1755    BMET    Component Value Date/Time   NA 140 02/22/2024 0029   NA 142 12/27/2023 1106   K 6.3 (HH) 02/22/2024 0029   CL 112 (H) 02/22/2024 0029   CO2 22 02/22/2024 0029   GLUCOSE 141 (H) 02/22/2024 0029   BUN 65 (H) 02/22/2024 0029   BUN 31 (H) 12/27/2023 1106   CREATININE 2.59 (H) 02/22/2024 0029   CALCIUM 8.2 (L) 02/22/2024 0029   GFRNONAA 24 (L) 02/22/2024 0029   GFRAA >60 04/18/2015 0407    COAGS: Lab Results  Component Value Date   INR 1.6 (H) 02/21/2024       ASSESSMENT/PLAN: This is a 83 y.o. male with DVT/PE now with GIB in need of IVC filter   -plan for OR with Dr. Randie Heinz this morning for IVC filter placement-on his way to pre op holding currently.  Pt without questions currently.  Wants to go to sleep for procedure.  -Dr. Randie Heinz to evaluate pt and determine further plan   Doreatha Massed, PA-C [facility] [phone]  VASCULAR STAFF ADDENDUM: I have  independently interviewed and examined the patient. I agree with the above.  I had a long conversation with both the patient and his wife regarding the above.  IVC filter is indicated due to the inability to tolerate anticoagulation.  After discussing the risks and benefits, Jerry [name] elected to proceed.  Victorino Sparrow MD [facility] Phone Number: [phone] 02/22/2024 11:06 AM

## 2024-02-22 NOTE — Op Note (Signed)
 [facility] Patient Name: Jerry [name] Procedure Date : 02/22/2024 MRN: [id number] Attending MD: Kathi Der , MD, [id number] Date of Birth: 12/18/1940 CSN: [id number] Age: 83 Admit Type: Inpatient Procedure:                Upper GI endoscopy Indications:              Melena Providers:                Kathi Der, MD, Stephens Shire RN, RN, Salley Scarlet, Technician Referring MD:              Medicines:                Sedation Administered by an Anesthesia Professional Complications:            No immediate complications. Estimated Blood Loss:     Estimated blood loss was minimal. Procedure:                Pre-Anesthesia Assessment:                           - Prior to the procedure, a History and Physical                            was performed, and patient medications and                            allergies were reviewed. The patient's tolerance of                            previous anesthesia was also reviewed. The risks                            and benefits of the procedure and the sedation                            options and risks were discussed with the patient.                            All questions were answered, and informed consent                            was obtained. Prior Anticoagulants: The patient has                            taken Eliquis (apixaban). ASA Grade Assessment: III                            - A patient with severe systemic disease. After                            reviewing the risks and benefits, the patient was  deemed in satisfactory condition to undergo the                            procedure.                           After obtaining informed consent, the endoscope was                            passed under direct vision. Throughout the                            procedure, the patient's blood pressure, pulse, and                            oxygen saturations  were monitored continuously. The                            [id number]-H190 ([id number]) Olympus endoscope was introduced                            through the mouth, and advanced to the second part                            of duodenum. The upper GI endoscopy was                            accomplished without difficulty. The patient                            tolerated the procedure well. Scope In: Scope Out: Findings:      A non-obstructing Schatzki ring was found at the gastroesophageal       junction.      A small hiatal hernia was present.      Scattered mild inflammation characterized by congestion (edema) and       erythema was found in the entire examined stomach.      The cardia and gastric fundus were normal on retroflexion.      The duodenal bulb, first portion of the duodenum and second portion of       the duodenum were normal. Impression:               - Non-obstructing Schatzki ring.                           - Small hiatal hernia.                           - Gastritis.                           - Normal duodenal bulb, first portion of the                            duodenum and second portion of the duodenum.                           -  No specimens collected. Recommendation:           - Return patient to hospital ward for ongoing care.                           - Clear liquid diet.                           - Continue present medications.                           - Perform a colonoscopy PRN. Procedure Code(s):        --- Professional ---                           [phone], Esophagogastroduodenoscopy, flexible,                            transoral; diagnostic, including collection of                            specimen(s) by brushing or washing, when performed                            (separate procedure) Diagnosis Code(s):        --- Professional ---                           K22.2, Esophageal obstruction                           K44.9, Diaphragmatic hernia without  obstruction or                            gangrene                           K29.70, Gastritis, unspecified, without bleeding                           K92.1, Melena (includes Hematochezia) CPT copyright 2022 American Medical Association. All rights reserved. The codes documented in this report are preliminary and upon coder review may  be revised to meet current compliance requirements. Kathi Der, MD Kathi Der, MD 02/22/2024 2:39:40 PM Number of Addenda: 0

## 2024-02-22 NOTE — Transfer of Care (Signed)
 Immediate Anesthesia Transfer of Care Note  Patient: Jerry [name].  Procedure(s) Performed: INSERTION, UMBRELLA FILTER, INFERIOR VENA CAVA (Right: Groin)  Patient Location: PACU  Anesthesia Type:MAC  Level of Consciousness: awake, drowsy, and patient cooperative  Airway & Oxygen Therapy: Patient Spontanous Breathing and Patient connected to nasal cannula oxygen  Post-op Assessment: Report given to RN and Post -op Vital signs reviewed and stable  Post vital signs: Reviewed and stable  Last Vitals:  Vitals Value Taken Time  BP 155/79 02/22/24 1245  Temp    Pulse 94 02/22/24 1246  Resp 17 02/22/24 1246  SpO2 100 % 02/22/24 1246  Vitals shown include unfiled device data.  Last Pain:  Vitals:   02/22/24 1057  TempSrc: Tympanic  PainSc: 0-No pain         Complications: No notable events documented.

## 2024-02-22 NOTE — Transfer of Care (Signed)
 Immediate Anesthesia Transfer of Care Note  Patient: Jerry [name].  Procedure(s) Performed: EGD (ESOPHAGOGASTRODUODENOSCOPY)  Patient Location: PACU  Anesthesia Type:MAC  Level of Consciousness: drowsy  Airway & Oxygen Therapy: Patient Spontanous Breathing  Post-op Assessment: Report given to RN  Post vital signs: Reviewed and stable  Last Vitals:  Vitals Value Taken Time  BP 104/53 02/22/24 1432  Temp    Pulse 100 02/22/24 1435  Resp 22 02/22/24 1435  SpO2 100 % 02/22/24 1435  Vitals shown include unfiled device data.  Last Pain:  Vitals:   02/22/24 1335  TempSrc: Temporal  PainSc: 0-No pain         Complications: No notable events documented.

## 2024-02-22 NOTE — Progress Notes (Signed)
 Repeat lactic acid is 4 trended up from 2.  Giving 1 L of LR bolus.  Continue maintenance fluid LR 150 cc/h.

## 2024-02-22 NOTE — Anesthesia Preprocedure Evaluation (Addendum)
 Anesthesia Evaluation  Patient identified by MRN, date of birth, ID band Patient awake    Reviewed: Allergy & Precautions, NPO status , Patient's Chart, lab work & pertinent test results  History of Anesthesia Complications Negative for: history of anesthetic complications  Airway Mallampati: III  TM Distance: >3 FB Neck ROM: Full    Dental  (+) Dental Advisory Given   Pulmonary neg shortness of breath, asthma , sleep apnea , neg COPD, neg recent URI, PE Chronic hypoxic respiratory failure on 3 L of oxygen at baseline   Pulmonary exam normal breath sounds clear to auscultation       Cardiovascular hypertension (metoprolol), Pt. 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  male with history of HTN, HLD, DM-2, C Kd stage IIIb, nonobstructive CAD-managed medically, chronic microcytic anemia-likely due to intermittent GI  bleeding in the setting of colonic/gastric AVMs, vascular dementia-who was recently hospitalized from 4/4-4/7 for PE/DVT-started on anticoagulation-presented with black/maroon-colored stools-found to have acute blood loss anemia, AKI on  CT kidney stage IIIb along with hyperkalemia.  Hgb 5.5 s/p 3 units pRBCs  K 6.3  Reproductive/Obstetrics                             Anesthesia Physical Anesthesia Plan  ASA: 4  Anesthesia Plan: MAC   Post-op Pain Management: Precedex   Induction: Intravenous  PONV Risk Score and Plan: 1 and Treatment may vary due to age or medical condition  Airway Management Planned: Natural Airway and Simple Face Mask  Additional Equipment:   Intra-op Plan:   Post-operative Plan:   Informed Consent: I have reviewed the patients History and Physical, chart, labs and discussed the procedure including the risks, benefits and alternatives for the proposed anesthesia with the patient or authorized representative who has indicated his/her understanding and acceptance.     Dental advisory given  Plan Discussed with: CRNA and Anesthesiologist  Anesthesia Plan Comments: (Discussed with patient risks of MAC including, but not limited to, minor pain or discomfort, hearing people in the room, and possible need for backup general anesthesia. Risks for general anesthesia also discussed including, but not limited to, sore throat, hoarse voice, chipped/damaged teeth, injury to vocal cords, nausea and vomiting, allergic reactions, lung infection, heart attack, stroke, and death. All questions answered. )        Anesthesia Quick Evaluation

## 2024-02-23 DIAGNOSIS — E875 Hyperkalemia: Secondary | ICD-10-CM | POA: Diagnosis not present

## 2024-02-23 DIAGNOSIS — I10 Essential (primary) hypertension: Secondary | ICD-10-CM | POA: Diagnosis not present

## 2024-02-23 DIAGNOSIS — K922 Gastrointestinal hemorrhage, unspecified: Secondary | ICD-10-CM | POA: Diagnosis not present

## 2024-02-23 DIAGNOSIS — N179 Acute kidney failure, unspecified: Secondary | ICD-10-CM | POA: Diagnosis not present

## 2024-02-23 LAB — BPAM RBC
Blood Product Expiration Date: 202505072359
Blood Product Expiration Date: 202505072359
Blood Product Expiration Date: 202505072359
Blood Product Expiration Date: 202505112359
ISSUE DATE / TIME: 202504102209
ISSUE DATE / TIME: 202504110056
ISSUE DATE / TIME: 202504110357
ISSUE DATE / TIME: 202504111801
Unit Type and Rh: 5100
Unit Type and Rh: 5100
Unit Type and Rh: 5100
Unit Type and Rh: 5100

## 2024-02-23 LAB — CBC
HCT: 25.8 % — ABNORMAL LOW (ref 39.0–52.0)
HCT: 26.8 % — ABNORMAL LOW (ref 39.0–52.0)
HCT: 29.5 % — ABNORMAL LOW (ref 39.0–52.0)
Hemoglobin: 7.9 g/dL — ABNORMAL LOW (ref 13.0–17.0)
Hemoglobin: 8.2 g/dL — ABNORMAL LOW (ref 13.0–17.0)
Hemoglobin: 8.9 g/dL — ABNORMAL LOW (ref 13.0–17.0)
MCH: 24.8 pg — ABNORMAL LOW (ref 26.0–34.0)
MCH: 25.1 pg — ABNORMAL LOW (ref 26.0–34.0)
MCH: 25 pg — ABNORMAL LOW (ref 26.0–34.0)
MCHC: 30.6 g/dL (ref 30.0–36.0)
MCHC: 30.6 g/dL (ref 30.0–36.0)
MCHC: 30.2 g/dL (ref 30.0–36.0)
MCV: 80.9 fL (ref 80.0–100.0)
MCV: 82 fL (ref 80.0–100.0)
MCV: 82.9 fL (ref 80.0–100.0)
Platelets: 127 10*3/uL — ABNORMAL LOW (ref 150–400)
Platelets: 122 10*3/uL — ABNORMAL LOW (ref 150–400)
Platelets: 146 10*3/uL — ABNORMAL LOW (ref 150–400)
RBC: 3.19 MIL/uL — ABNORMAL LOW (ref 4.22–5.81)
RBC: 3.27 MIL/uL — ABNORMAL LOW (ref 4.22–5.81)
RBC: 3.56 MIL/uL — ABNORMAL LOW (ref 4.22–5.81)
RDW: 19.8 % — ABNORMAL HIGH (ref 11.5–15.5)
RDW: 20.1 % — ABNORMAL HIGH (ref 11.5–15.5)
RDW: 20.2 % — ABNORMAL HIGH (ref 11.5–15.5)
WBC: 8.8 10*3/uL (ref 4.0–10.5)
WBC: 8.2 10*3/uL (ref 4.0–10.5)
WBC: 8.9 10*3/uL (ref 4.0–10.5)
nRBC: 0 % (ref 0.0–0.2)
nRBC: 0 % (ref 0.0–0.2)
nRBC: 0 % (ref 0.0–0.2)

## 2024-02-23 LAB — TYPE AND SCREEN
ABO/RH(D): O POS
Antibody Screen: NEGATIVE
Unit division: 0
Unit division: 0
Unit division: 0
Unit division: 0

## 2024-02-23 LAB — GLUCOSE, CAPILLARY
Glucose-Capillary: 76 mg/dL (ref 70–99)
Glucose-Capillary: 92 mg/dL (ref 70–99)
Glucose-Capillary: 122 mg/dL — ABNORMAL HIGH (ref 70–99)
Glucose-Capillary: 95 mg/dL (ref 70–99)
Glucose-Capillary: 82 mg/dL (ref 70–99)

## 2024-02-23 LAB — URINALYSIS, ROUTINE W REFLEX MICROSCOPIC
Bilirubin Urine: NEGATIVE
Glucose, UA: NEGATIVE mg/dL
Hgb urine dipstick: NEGATIVE
Ketones, ur: NEGATIVE mg/dL
Leukocytes,Ua: NEGATIVE
Nitrite: NEGATIVE
Protein, ur: NEGATIVE mg/dL
Specific Gravity, Urine: 1.009 (ref 1.005–1.030)
pH: 6 (ref 5.0–8.0)

## 2024-02-23 LAB — COMPREHENSIVE METABOLIC PANEL WITH GFR
ALT: 21 U/L (ref 0–44)
AST: 31 U/L (ref 15–41)
Albumin: 2.8 g/dL — ABNORMAL LOW (ref 3.5–5.0)
Alkaline Phosphatase: 46 U/L (ref 38–126)
Anion gap: 8 (ref 5–15)
BUN: 39 mg/dL — ABNORMAL HIGH (ref 8–23)
CO2: 22 mmol/L (ref 22–32)
Calcium: 8.8 mg/dL — ABNORMAL LOW (ref 8.9–10.3)
Chloride: 110 mmol/L (ref 98–111)
Creatinine, Ser: 2.2 mg/dL — ABNORMAL HIGH (ref 0.61–1.24)
GFR, Estimated: 30 mL/min — ABNORMAL LOW (ref >60)
Glucose, Bld: 82 mg/dL (ref 70–99)
Potassium: 5.3 mmol/L — ABNORMAL HIGH (ref 3.5–5.1)
Sodium: 140 mmol/L (ref 135–145)
Total Bilirubin: 0.8 mg/dL (ref 0.0–1.2)
Total Protein: 5.3 g/dL — ABNORMAL LOW (ref 6.5–8.1)

## 2024-02-23 LAB — CREATININE, URINE, RANDOM: Creatinine, Urine: 48 mg/dL

## 2024-02-23 LAB — SODIUM, URINE, RANDOM: Sodium, Ur: 83 mmol/L

## 2024-02-23 MED ORDER — SODIUM ZIRCONIUM CYCLOSILICATE 10 G PO PACK
10.0000 g | PACK | Freq: Two times a day (BID) | ORAL | Status: AC
  Administered 2024-02-23 (×2): 10 g via ORAL
  Filled 2024-02-23: qty 1

## 2024-02-23 MED ORDER — PANTOPRAZOLE SODIUM 40 MG IV SOLR
40.0000 mg | INTRAVENOUS | Status: DC
  Administered 2024-02-24: 40 mg via INTRAVENOUS
  Filled 2024-02-23: qty 10

## 2024-02-23 NOTE — Progress Notes (Signed)
 Doing well postop IVC filter. Anticoagulation should be restarted when able. I had a long conversation with Jerry [name] regarding his filter, and his advanced age.  At this point, I think he would be best served with keeping the filter lifelong.   With recent DVT due to decreased mobility and with his history of AVMs, GIB, I think that there is a high likelihood that he will have a recurrent episode, and will need to be off of anticoagulation at some point in the future.  Jerry [name] was comfortable with this plan moving forward.  Kayla Part MD

## 2024-02-23 NOTE — Evaluation (Signed)
 Physical Therapy Evaluation Patient Details Name: Jerry [name]. MRN: [id number] DOB: 1941-10-19 Today's Date: 02/23/2024  History of Present Illness  83 y.o. male presents to [facility] on 02/21/24 with rectal bleeding. Patient with recent PE and put on blood thinners. 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 PTA patient was in/out of hospital several times. Wife reports he has been getting weaker and primarily uses RW for ambulation at home (pt reports he uses a cane, and she gently corrects him).  Pt currently requires CGA to ambulate 90 ft with RW with HRmax 135 and ++dyspnea with sats 90% on RA. Wife concerned pt will be released too quickly and end up "coming right back." Anticipate patient will benefit from PT to address problems listed below.Will continue to follow acutely to maximize functional mobility independence and safety. Patient can benefit from HHPT on discharge.          If plan is discharge home, recommend the following: A little help with walking and/or transfers;A little help with bathing/dressing/bathroom;Assistance with cooking/housework;Assist for transportation;Help with stairs or ramp for entrance   Can travel by private vehicle        Equipment Recommendations None recommended by PT  Recommendations for Other Services  OT consult    Functional Status Assessment Patient has had a recent decline in their functional status and demonstrates the ability to make significant improvements in function in a reasonable and predictable amount of time.     Precautions / Restrictions Precautions Precautions: Fall Precaution/Restrictions Comments: watch O2 Restrictions Weight Bearing Restrictions Per Provider Order: No      Mobility   Bed Mobility Overal bed mobility: Needs Assistance Bed Mobility: Supine to Sit, Sit to Supine     Supine to sit: HOB elevated, Supervision Sit to supine: Supervision, HOB elevated   General bed mobility comments: supervision for safety, increased time, no physical assist    Transfers Overall transfer level: Needs assistance Equipment used: Rolling walker (2 wheels) Transfers: Sit to/from Stand, Bed to chair/wheelchair/BSC Sit to Stand: Min assist           General transfer comment: from EOB no assist; from chair without armrests min assist    Ambulation/Gait Ambulation/Gait assistance: Contact guard assist Gait Distance (Feet): 90 Feet Assistive device: Rolling walker (2 wheels) Gait Pattern/deviations: Step-through pattern, Trunk flexed, Decreased dorsiflexion - right, Decreased dorsiflexion - left, Decreased step length - right, Decreased step length - left Gait velocity: decr     General Gait Details: short steps, progressively loosing proximity to walker, able to correct with verbal cues. Pts SPO2 at 90% on RA; had to sit and rest on re-entering room due to dyspnea  Stairs            Wheelchair Mobility     Tilt Bed    Modified Rankin (Stroke Patients Only)       Balance Overall balance assessment: Needs assistance Sitting-balance support: No upper extremity supported, Feet supported Sitting balance-Leahy Scale: Good     Standing balance support: Bilateral upper extremity supported, During functional activity, Reliant on assistive device for balance Standing balance-Leahy Scale: Poor Standing balance comment: reliant on RW  Pertinent Vitals/Pain Pain Assessment Pain Assessment: No/denies pain Faces Pain Scale: No hurt    Home Living Family/patient expects to be discharged to:: Private residence Living Arrangements: Spouse/significant other Available Help at Discharge: Available 24 hours/day Type of Home:  House Home Access: Stairs to enter Entrance Stairs-Rails: Right Entrance Stairs-Number of Steps: 2   Home Layout: One level Home Equipment: Grab bars - toilet;Shower seat;Grab bars - Chartered loss adjuster (2 wheels);Cane - single point      Prior Function Prior Level of Function : Independent/Modified Independent;Driving             Mobility Comments: walks with a RW vs cane       Extremity/Trunk Assessment   Upper Extremity Assessment Upper Extremity Assessment: Defer to OT evaluation    Lower Extremity Assessment Lower Extremity Assessment: Generalized weakness    Cervical / Trunk Assessment Cervical / Trunk Assessment: Kyphotic  Communication   Communication Communication: No apparent difficulties    Cognition Arousal: Alert Behavior During Therapy: WFL for tasks assessed/performed   PT - Cognitive impairments: History of cognitive impairments                       PT - Cognition Comments: wife corrected his answers re: prior functional status and home setup Following commands: Intact Following commands impaired: Follows one step commands with increased time     Cueing Cueing Techniques: Verbal cues, Tactile cues     General Comments General comments (skin integrity, edema, etc.): at rest HR 109 Sats 94% on RA; walking HRmax 135 Sats 90% on RA; moderate dyspnea on return to room with pt sitting to rest in straight chair just inside his door    Exercises     Assessment/Plan    PT Assessment Patient needs continued PT services  PT Problem List Decreased strength;Decreased activity tolerance;Decreased balance;Decreased mobility;Cardiopulmonary status limiting activity;Decreased knowledge of use of DME       PT Treatment Interventions DME instruction;Gait training;Stair training;Functional mobility training;Therapeutic activities;Therapeutic exercise;Balance training;Patient/family education    PT Goals (Current goals can be found in the  Care Plan section)  Acute Rehab PT Goals Patient Stated Goal: to get better PT Goal Formulation: With patient/family Time For Goal Achievement: 03/08/24 Potential to Achieve Goals: Good    Frequency Min 2X/week     Co-evaluation               AM-PAC PT "6 Clicks" Mobility  Outcome Measure Help needed turning from your back to your side while in a flat bed without using bedrails?: A Little Help needed moving from lying on your back to sitting on the side of a flat bed without using bedrails?: A Little Help needed moving to and from a bed to a chair (including a wheelchair)?: A Little Help needed standing up from a chair using your arms (e.g., wheelchair or bedside chair)?: A Little Help needed to walk in hospital room?: A Little Help needed climbing 3-5 steps with a railing? : Total 6 Click Score: 16    End of Session Equipment Utilized During Treatment: Gait belt Activity Tolerance: Treatment limited secondary to medical complications (Comment) (dyspnea, elevated HR) Patient left: with call bell/phone within reach;in bed;with bed alarm set;with family/visitor present;Other (comment) (lab tech) Nurse Communication: Mobility status;Other (comment) (dyspnea) PT Visit Diagnosis: Unsteadiness on feet (R26.81);Other abnormalities of gait and mobility (R26.89);Muscle weakness (generalized) (M62.81)    Time: 1610-9604 PT Time Calculation (min) (ACUTE ONLY): 14 min   Charges:  PT Evaluation $PT Eval Low Complexity: 1 Low   PT General Charges $$ ACUTE PT VISIT: 1 Visit          Gayle Kava, PT Acute Rehabilitation Services  Office [phone]   [place] Leep 02/23/2024, 4:39 PM

## 2024-02-23 NOTE — Progress Notes (Addendum)
 PROGRESS NOTE        PATIENT DETAILS Name: Jerry [name]. Age: 83 y.o. Sex: male Date of Birth: Oct 21, 1941 Admit Date: 02/21/2024 Admitting Physician Jetty Mort, MD ZOX:[id number], Jullie Oiler, MD  Brief Summary: Patient is a 83 y.o.  male with history of HTN, HLD, DM-2, C Kd stage IIIb, nonobstructive CAD-managed medically, chronic microcytic anemia-likely due to intermittent GI bleeding in the setting of colonic/gastric AVMs, vascular dementia-who was recently hospitalized from 4/4-4/7 for PE/DVT-started on anticoagulation-presented with black/maroon-colored stools-found to have acute blood loss anemia, AKI on CT kidney stage IIIb along with hyperkalemia.  Significant events: 4/10>> admit to [facility]-GI bleed-AKI/hyperkalemia.  Significant studies: 4/04>> CTA chest: B/L lower lobe segmental PE. 4/06>> B/L lower Doppler: Acute DVT left popliteal vein 4/10>> CXR: No PNA  Significant microbiology data: None  Procedures: 4/11>> IVC filter placement by Dr. Angela Kell 4/11>> EGD: No bleeding etiology found  Consults: GI Vascular surgery  Subjective: Another episode of maroon-colored stools overnight.  Objective: Vitals: Blood pressure (!) 147/77, pulse 88, temperature 98.5 F (36.9 C), temperature source Oral, resp. rate 16, height 5\' 11"  (1.803 m), weight 84.2 kg, SpO2 100%.   Exam: Gen Exam:Alert awake-not in any distress HEENT:atraumatic, normocephalic Chest: B/L clear to auscultation anteriorly CVS:S1S2 regular Abdomen:soft non tender, non distended Extremities:no edema Neurology: Non focal Skin: no rash  Pertinent Labs/Radiology:    Latest Ref Rng & Units 02/23/2024    6:57 AM 02/23/2024   12:47 AM 02/22/2024   11:12 AM  CBC  WBC 4.0 - 10.5 K/uL 8.2  8.8    Hemoglobin 13.0 - 17.0 g/dL 8.2  7.9  7.8   Hematocrit 39.0 - 52.0 % 26.8  25.8  23.0   Platelets 150 - 400 K/uL 122  127      Lab Results  Component Value Date   NA 143  02/22/2024   K 5.7 (H) 02/22/2024   CL 111 02/22/2024   CO2 25 02/22/2024      Assessment/Plan: GI bleed with acute blood loss anemia and hypovolemic shock in the setting of recent initiation of anticoagulation for DVT/PE-known history of gastric/colonic AVMs 1 episode of maroon-colored stools overnight Hb stable this morning-s/p 4 units of PRBC-last transfused yesterday evening. EGD unremarkable GI contemplating colonoscopy over the weekend Follow CBC No longer on anticoagulation-s/p IVC filter placement Await further recommendations from GI.  AKI on CKD stage IIIb Hemodynamically mediated due to GI bleed/hypotension Awaiting repeat labs this morning.  Hyperkalemia Due to worsening AKI Has been treated with insulin/D50/Lokelma Awaiting repeat labs this morning.  Lactic acidosis Due to hypoperfusion from GI bleeding-sepsis ruled out.  HTN BP stable-but creeping up Continue to hold metoprolol.  History of nonobstructive CAD No anginal symptoms-attempt to keep Hb> 8 Aspirin on hold.  Recent PE and left popliteal DVT Unable to anticoagulate given severity of bleeding-known history of AVMs S/p IVC filter placement  Asthma without exacerbation Pulmonary hypertension Chronic hypoxic respiratory failure on 3 L of oxygen at baseline Continue bronchodilators Stable on 3 L of oxygen  DM-2 (A1c 6.2 on 2/13) CBGs stable on SSI  Recent Labs    02/22/24 1645 02/22/24 1940 02/23/24 0843  GLUCAP 109* 105* 76     HLD Statin   Dementia Mood disorder Delirium precautions Continue Namenda/olanzapine  BMI: Estimated body mass index is 25.89 kg/m as calculated from the following:  Height as of this encounter: 5\' 11"  (1.803 m).   Weight as of this encounter: 84.2 kg.   Code status:   Code Status: Full Code   DVT Prophylaxis: SCDs Start: 02/21/24 1921 Place TED hose Start: 02/21/24 1921   Family Communication: None at bedside   Disposition Plan: Status is:  Inpatient Remains inpatient appropriate because: Severity of illness   Planned Discharge Destination:Home   Diet: Diet Order             Diet full liquid Room service appropriate? Yes; Fluid consistency: Thin  Diet effective now                     Antimicrobial agents: Anti-infectives (From admission, onward)    None        MEDICATIONS: Scheduled Meds:  atorvastatin  20 mg Oral Daily   escitalopram  5 mg Oral Daily   fluticasone furoate-vilanterol  1 puff Inhalation BID   insulin aspart  0-5 Units Subcutaneous QHS   insulin aspart  0-6 Units Subcutaneous TID WC   memantine  5 mg Oral BID   pantoprazole (PROTONIX) IV  40 mg Intravenous Q12H   sodium zirconium cyclosilicate  10 g Oral BID   Continuous Infusions:  sodium chloride Stopped (02/22/24 1447)   PRN Meds:.acetaminophen **OR** acetaminophen, albuterol, ALPRAZolam, benzonatate, guaiFENesin-dextromethorphan, OLANZapine, ondansetron **OR** ondansetron (ZOFRAN) IV   I have personally reviewed following labs and imaging studies  LABORATORY DATA: CBC: Recent Labs  Lab 02/18/24 0302 02/21/24 1755 02/22/24 0029 02/22/24 1010 02/22/24 1112 02/23/24 0047 02/23/24 0657  WBC 6.8 10.5  --  10.1  --  8.8 8.2  NEUTROABS  --  6.7  --   --   --   --   --   HGB 8.1* 6.2* 5.5* 7.9* 7.8* 7.9* 8.2*  HCT 28.2* 23.1* 18.8* 25.0* 23.0* 25.8* 26.8*  MCV 76.8* 81.1  --  81.4  --  80.9 82.0  PLT 208 178  --  130*  --  127* 122*    Basic Metabolic Panel: Recent Labs  Lab 02/17/24 0246 02/18/24 0302 02/21/24 1755 02/22/24 0029 02/22/24 1010 02/22/24 1112 02/22/24 1546  NA 142 141 141 140 141 141 143  K 4.5 4.6 6.0* 6.3* 5.5* 5.1 5.7*  CL 107 106 109 112* 108 110 111  CO2 28 27 23 22 23   --  25  GLUCOSE 79 151* 130* 141* 102* 95 120*  BUN 33* 33* 65* 65* 56* 48* 49*  CREATININE 1.93* 2.23* 2.78* 2.59* 2.43* 2.50* 2.41*  CALCIUM 8.8* 8.5* 8.5* 8.2* 8.4*  --  8.6*  MG 2.0  --   --   --   --   --   --      GFR: Estimated Creatinine Clearance: 26 mL/min (A) (by C-G formula based on SCr of 2.41 mg/dL (H)).  Liver Function Tests: Recent Labs  Lab 02/21/24 1755 02/22/24 1010  AST 21 21  ALT 21 15  ALKPHOS 37* 33*  BILITOT 0.4 0.7  PROT 5.0* 4.3*  ALBUMIN 2.5* 2.3*   No results for input(s): "LIPASE", "AMYLASE" in the last 168 hours. No results for input(s): "AMMONIA" in the last 168 hours.  Coagulation Profile: Recent Labs  Lab 02/21/24 1755  INR 1.6*    Cardiac Enzymes: No results for input(s): "CKTOTAL", "CKMB", "CKMBINDEX", "TROPONINI" in the last 168 hours.  BNP (last 3 results) Recent Labs    12/27/23 1105  PROBNP 1,952*    Lipid Profile: No  results for input(s): "CHOL", "HDL", "LDLCALC", "TRIG", "CHOLHDL", "LDLDIRECT" in the last 72 hours.  Thyroid Function Tests: No results for input(s): "TSH", "T4TOTAL", "FREET4", "T3FREE", "THYROIDAB" in the last 72 hours.  Anemia Panel: No results for input(s): "VITAMINB12", "FOLATE", "FERRITIN", "TIBC", "IRON", "RETICCTPCT" in the last 72 hours.  Urine analysis:    Component Value Date/Time   COLORURINE YELLOW 02/23/2024 0635   APPEARANCEUR CLEAR 02/23/2024 0635   LABSPEC 1.009 02/23/2024 0635   PHURINE 6.0 02/23/2024 0635   GLUCOSEU NEGATIVE 02/23/2024 0635   HGBUR NEGATIVE 02/23/2024 0635   BILIRUBINUR NEGATIVE 02/23/2024 0635   KETONESUR NEGATIVE 02/23/2024 0635   PROTEINUR NEGATIVE 02/23/2024 0635   UROBILINOGEN 0.2 04/17/2015 1820   NITRITE NEGATIVE 02/23/2024 0635   LEUKOCYTESUR NEGATIVE 02/23/2024 0635    Sepsis Labs: Lactic Acid, Venous    Component Value Date/Time   LATICACIDVEN 2.5 (HH) 02/22/2024 1546    MICROBIOLOGY: Recent Results (from the past 240 hours)  Resp panel by RT-PCR (RSV, Flu A&B, Covid) Anterior Nasal Swab     Status: None   Collection Time: 02/15/24  4:43 AM   Specimen: Anterior Nasal Swab  Result Value Ref Range Status   SARS Coronavirus 2 by RT PCR NEGATIVE NEGATIVE  Final   Influenza A by PCR NEGATIVE NEGATIVE Final   Influenza B by PCR NEGATIVE NEGATIVE Final    Comment: (NOTE) The Xpert Xpress SARS-CoV-2/FLU/RSV plus assay is intended as an aid in the diagnosis of influenza from Nasopharyngeal swab specimens and should not be used as a sole basis for treatment. Nasal washings and aspirates are unacceptable for Xpert Xpress SARS-CoV-2/FLU/RSV testing.  Fact Sheet for Patients: BloggerCourse.com  Fact Sheet for Healthcare Providers: SeriousBroker.it  This test is not yet approved or cleared by the [place]  FDA and has been authorized for detection and/or diagnosis of SARS-CoV-2 by FDA under an Emergency Use Authorization (EUA). This EUA will remain in effect (meaning this test can be used) for the duration of the COVID-19 declaration under Section 564(b)(1) of the Act, 21 U.S.C. section 360bbb-3(b)(1), unless the authorization is terminated or revoked.     Resp Syncytial Virus by PCR NEGATIVE NEGATIVE Final    Comment: (NOTE) Fact Sheet for Patients: BloggerCourse.com  Fact Sheet for Healthcare Providers: SeriousBroker.it  This test is not yet approved or cleared by the [place]  FDA and has been authorized for detection and/or diagnosis of SARS-CoV-2 by FDA under an Emergency Use Authorization (EUA). This EUA will remain in effect (meaning this test can be used) for the duration of the COVID-19 declaration under Section 564(b)(1) of the Act, 21 U.S.C. section 360bbb-3(b)(1), unless the authorization is terminated or revoked.  Performed at [facility], [address] [place], [place] [address]     RADIOLOGY STUDIES/RESULTS: VAS US  LOWER EXTREMITY VENOUS (DVT) Result Date: 02/22/2024  Lower Venous DVT Study Patient Name:  Jerry [name].  Date of Exam:   02/22/2024 Medical Rec #: [id number]          Accession #:     [id number] Date of Birth: 15-May-1941           Patient Gender: M Patient Age:   78 years Exam Location:  [facility] Procedure:      VAS US  LOWER EXTREMITY VENOUS (DVT) Referring Phys: Jetty Mort --------------------------------------------------------------------------------  Indications: History left popliteal acute DVT on 02/17/24 - Follow up study.  Risk Factors: DVT 02/17/24. Performing Technologist: Franky Ivanoff Sturdivant-Jones RDMS, RVT  Examination Guidelines: A complete evaluation  includes B-mode imaging, spectral Doppler, color Doppler, and power Doppler as needed of all accessible portions of each vessel. Bilateral testing is considered an integral part of a complete examination. Limited examinations for reoccurring indications may be performed as noted. The reflux portion of the exam is performed with the patient in reverse Trendelenburg.  +---------+---------------+---------+-----------+----------+--------------+ LEFT     CompressibilityPhasicitySpontaneityPropertiesThrombus Aging +---------+---------------+---------+-----------+----------+--------------+ CFV      Full           Yes      Yes                                 +---------+---------------+---------+-----------+----------+--------------+ SFJ      Full                                                        +---------+---------------+---------+-----------+----------+--------------+ FV Prox  Full                                                        +---------+---------------+---------+-----------+----------+--------------+ FV Mid   Full                                                        +---------+---------------+---------+-----------+----------+--------------+ FV DistalFull                                                        +---------+---------------+---------+-----------+----------+--------------+ PFV      Full                                                         +---------+---------------+---------+-----------+----------+--------------+ POP      Partial        Yes      Yes                  Acute          +---------+---------------+---------+-----------+----------+--------------+ PTV      Full                                                        +---------+---------------+---------+-----------+----------+--------------+ PERO     Full                                                        +---------+---------------+---------+-----------+----------+--------------+  Summary: LEFT: - Findings consistent with acute deep vein thrombosis involving the left popliteal vein.  - Findings appear essentially unchanged compared to previous examination.  *See table(s) above for measurements and observations. Electronically signed by Irvin Mantel on 02/22/2024 at 9:00:00 PM.    Final    ECHOCARDIOGRAM COMPLETE Result Date: 02/22/2024    ECHOCARDIOGRAM LIMITED REPORT   Patient Name:   Jerry [name]. Date of Exam: 02/22/2024 Medical Rec #:  [id number]         Height:       71.0 in Accession #:    [id number]        Weight:       169.1 lb Date of Birth:  11-17-1940          BSA:          1.963 m Patient Age:    82 years          BP:           102/54 mmHg Patient Gender: M                 HR:           97 bpm. Exam Location:  Inpatient Procedure: 2D Echo, Cardiac Doppler and Color Doppler (Both Spectral and Color            Flow Doppler were utilized during procedure). Indications:    Pulmonary Embolus I26.09  History:        Patient has prior history of Echocardiogram examinations, most                 recent 01/07/2024.  Sonographer:    Hersey Lorenzo RDCS Referring Phys: [phone] SUBRINA SUNDIL IMPRESSIONS  1. Left ventricular ejection fraction, by estimation, is 70 to 75%. The left ventricle has hyperdynamic function. The left ventricle has no regional wall motion abnormalities. There is mild concentric left ventricular hypertrophy. There is the interventricular  septum is flattened in systole, consistent with right ventricular pressure overload.  2. Right ventricular systolic function is low normal. The right ventricular size is normal.  3. Right atrial size was mildly dilated.  4. The mitral valve is normal in structure. No evidence of mitral valve regurgitation. No evidence of mitral stenosis.  5. The aortic valve is tricuspid. There is moderate calcification of the aortic valve. Aortic valve regurgitation is not visualized. Aortic valve sclerosis/calcification is present, without any evidence of aortic stenosis.  6. The inferior vena cava is normal in size with greater than 50% respiratory variability, suggesting right atrial pressure of 3 mmHg. Conclusion(s)/Recommendation(s): Full study unable to be loaded currently. FINDINGS  Left Ventricle: Left ventricular ejection fraction, by estimation, is 70 to 75%. The left ventricle has hyperdynamic function. The left ventricle has no regional wall motion abnormalities. The left ventricular internal cavity size was normal in size. There is mild concentric left ventricular hypertrophy. The interventricular septum is flattened in systole, consistent with right ventricular pressure overload. Right Ventricle: The right ventricular size is normal. No increase in right ventricular wall thickness. Right ventricular systolic function is low normal. Left Atrium: Left atrial size was normal in size. Right Atrium: Right atrial size was mildly dilated. Pericardium: Trivial pericardial effusion is present. The pericardial effusion is posterior to the left ventricle. Mitral Valve: The mitral valve is normal in structure. No evidence of mitral valve stenosis. Tricuspid Valve: The tricuspid valve is normal in structure. Tricuspid valve regurgitation is trivial. No evidence of tricuspid stenosis. Aortic Valve:  The aortic valve is tricuspid. There is moderate calcification of the aortic valve. Aortic valve regurgitation is not visualized.  Aortic valve sclerosis/calcification is present, without any evidence of aortic stenosis. Pulmonic Valve: The pulmonic valve was normal in structure. Pulmonic valve regurgitation is trivial. No evidence of pulmonic stenosis. Aorta: The aortic root is normal in size and structure. Venous: The inferior vena cava is normal in size with greater than 50% respiratory variability, suggesting right atrial pressure of 3 mmHg. IAS/Shunts: No atrial level shunt detected by color flow Doppler. LEFT VENTRICLE PLAX 2D LVIDd:         4.70 cm   Diastology LVIDs:         2.40 cm   LV e' lateral:   6.85 cm/s LV PW:         0.90 cm   LV E/e' lateral: 12.0 LV IVS:        0.80 cm LVOT diam:     2.00 cm LV SV:         74 LV SV Index:   37 LVOT Area:     3.14 cm  RIGHT VENTRICLE RV S prime:     15.20 cm/s TAPSE (M-mode): 2.1 cm LEFT ATRIUM             Index        RIGHT ATRIUM           Index LA diam:        2.60 cm 1.32 cm/m   RA Area:     23.30 cm LA Vol (A2C):   49.2 ml 25.06 ml/m  RA Volume:   66.60 ml  33.92 ml/m LA Vol (A4C):   60.6 ml 30.86 ml/m LA Biplane Vol: 56.1 ml 28.57 ml/m  AORTIC VALVE LVOT Vmax:   120.00 cm/s LVOT Vmean:  84.400 cm/s LVOT VTI:    0.234 m  AORTA Ao Root diam: 3.00 cm Ao Asc diam:  3.00 cm MITRAL VALVE MV Area (PHT): 5.50 cm     SHUNTS MV Decel Time: 138 msec     Systemic VTI:  0.23 m MV E velocity: 82.30 cm/s   Systemic Diam: 2.00 cm MV A velocity: 111.00 cm/s MV E/A ratio:  0.74 Arta Lark Electronically signed by Arta Lark Signature Date/Time: 02/22/2024/10:48:35 AM    Final    HYBRID OR IMAGING ([facility] ONLY) Result Date: 02/22/2024 There is no interpretation for this exam.  This order is for images obtained during a surgical procedure.  Please See "Surgeries" Tab for more information regarding the procedure.   DG Chest Port 1 View Result Date: 02/21/2024 CLINICAL DATA:  GI bleed EXAM: PORTABLE CHEST 1 VIEW COMPARISON:  02/15/2024 FINDINGS: Probable small left effusion with mild  atelectasis or infiltrate persisting at the left base. Stable cardiomediastinal silhouette. No pneumothorax IMPRESSION: Probable small left effusion with mild atelectasis or infiltrate at the left base. Electronically Signed   By: Esmeralda Hedge M.D.   On: 02/21/2024 20:33     LOS: 2 days   Kimberly Penna, MD  [facility]    To contact the attending provider between 7A-7P or the covering provider during after hours 7P-7A, please log into the web site [email or web address] and access using universal Isabel password for that web site. If you do not have the password, please call the hospital operator.  02/23/2024, 9:09 AM

## 2024-02-23 NOTE — Plan of Care (Signed)

## 2024-02-23 NOTE — Progress Notes (Signed)
 Subjective: Patient had 1 episode of brown bowel movement, discussed with his nurse present at bedside. He complains of intermittent upper abdominal pain which feels like cramps.  Objective: Vital signs in last 24 hours: Temp:  [98.2 F (36.8 C)-98.9 F (37.2 C)] 98.2 F (36.8 C) (04/12 1200) Pulse Rate:  [82-94] 87 (04/12 1200) Resp:  [12-26] 12 (04/12 1200) BP: (96-156)/(53-95) 156/78 (04/12 1200) SpO2:  [96 %-100 %] 98 % (04/12 1200) FiO2 (%):  [28 %] 28 % (04/11 2145) Weight:  [84.2 kg] 84.2 kg (04/12 0405) Weight change:  Last BM Date : 02/22/24  PE: Elderly, appears deconditioned GENERAL: Lying comfortably on bed, able to speak in full sentences, on room air  ABDOMEN: Slightly distended but nontender, normal active bowel sounds EXTREMITIES: No deformity  Lab Results: Results for orders placed or performed during the hospital encounter of 02/21/24 (from the past 48 hours)  POC occult blood, ED Provider will collect     Status: Abnormal   Collection Time: 02/21/24  5:44 PM  Result Value Ref Range   Fecal Occult Bld POSITIVE (A) NEGATIVE  Comprehensive metabolic panel     Status: Abnormal   Collection Time: 02/21/24  5:55 PM  Result Value Ref Range   Sodium 141 135 - 145 mmol/L   Potassium 6.0 (H) 3.5 - 5.1 mmol/L   Chloride 109 98 - 111 mmol/L   CO2 23 22 - 32 mmol/L   Glucose, Bld 130 (H) 70 - 99 mg/dL    Comment: Glucose reference range applies only to samples taken after fasting for at least 8 hours.   BUN 65 (H) 8 - 23 mg/dL   Creatinine, Ser 1.61 (H) 0.61 - 1.24 mg/dL   Calcium 8.5 (L) 8.9 - 10.3 mg/dL   Total Protein 5.0 (L) 6.5 - 8.1 g/dL   Albumin 2.5 (L) 3.5 - 5.0 g/dL   AST 21 15 - 41 U/L   ALT 21 0 - 44 U/L   Alkaline Phosphatase 37 (L) 38 - 126 U/L   Total Bilirubin 0.4 0.0 - 1.2 mg/dL   GFR, Estimated 22 (L) >60 mL/min    Comment: (NOTE) Calculated using the CKD-EPI Creatinine Equation (2021)    Anion gap 9 5 - 15    Comment: Performed at [facility], [address], [place], [place] [address]  CBC with Differential     Status: Abnormal   Collection Time: 02/21/24  5:55 PM  Result Value Ref Range   WBC 10.5 4.0 - 10.5 K/uL   RBC 2.85 (L) 4.22 - 5.81 MIL/uL   Hemoglobin 6.2 (LL) 13.0 - 17.0 g/dL    Comment: REPEATED TO VERIFY THIS CRITICAL RESULT HAS VERIFIED AND BEEN CALLED TO P.BLANCHARD,RN BY JOHN VANG ON 04 10 2025 AT 1827, AND HAS BEEN READ BACK.     HCT 23.1 (L) 39.0 - 52.0 %   MCV 81.1 80.0 - 100.0 fL   MCH 21.8 (L) 26.0 - 34.0 pg   MCHC 26.8 (L) 30.0 - 36.0 g/dL   RDW 54.0 (H) 98.1 - 19.1 %   Platelets 178 150 - 400 K/uL   nRBC 0.0 0.0 - 0.2 %   Neutrophils Relative % 64 %   Neutro Abs 6.7 1.7 - 7.7 K/uL   Lymphocytes Relative 24 %   Lymphs Abs 2.5 0.7 - 4.0 K/uL   Monocytes Relative 8 %   Monocytes Absolute 0.8 0.1 - 1.0 K/uL   Eosinophils Relative 3 %   Eosinophils  Absolute 0.3 0.0 - 0.5 K/uL   Basophils Relative 0 %   Basophils Absolute 0.0 0.0 - 0.1 K/uL   Immature Granulocytes 1 %   Abs Immature Granulocytes 0.15 (H) 0.00 - 0.07 K/uL    Comment: Performed at [facility], [address] [place], [place] [address]  Protime-INR     Status: Abnormal   Collection Time: 02/21/24  5:55 PM  Result Value Ref Range   Prothrombin Time 18.9 (H) 11.4 - 15.2 seconds   INR 1.6 (H) 0.8 - 1.2    Comment: (NOTE) INR goal varies based on device and disease states. Performed at [facility], [address], [place], [place] [address]   Type and screen [facility]     Status: None   Collection Time: 02/21/24  5:55 PM  Result Value Ref Range   ABO/RH(D) O POS    Antibody Screen NEG    Sample Expiration 02/24/2024,2359    Unit Number [id number]    Blood Component Type RED CELLS,LR    Unit division 00    Status of Unit ISSUED,FINAL    Transfusion Status OK TO TRANSFUSE    Crossmatch Result Compatible    Unit Number [id number]    Blood Component Type RED CELLS,LR    Unit  division 00    Status of Unit ISSUED,FINAL    Transfusion Status OK TO TRANSFUSE    Crossmatch Result Compatible    Unit Number [id number]    Blood Component Type RBC LR PHER1    Unit division 00    Status of Unit ISSUED,FINAL    Transfusion Status OK TO TRANSFUSE    Crossmatch Result Compatible    Unit Number [id number]    Blood Component Type RED CELLS,LR    Unit division 00    Status of Unit ISSUED,FINAL    Transfusion Status OK TO TRANSFUSE    Crossmatch Result      Compatible Performed at [facility], [address], [place], [place] [address]   Lactic acid, plasma     Status: Abnormal   Collection Time: 02/21/24  7:21 PM  Result Value Ref Range   Lactic Acid, Venous 2.0 (HH) 0.5 - 1.9 mmol/L    Comment: CRITICAL RESULT CALLED TO, READ BACK BY AND VERIFIED WITH W EZELL,RN 2011 02/21/2024 WBOND Performed at [facility], [address], [place], [place] [address]   Prepare RBC (crossmatch)     Status: None   Collection Time: 02/21/24  8:00 PM  Result Value Ref Range   Order Confirmation      ORDER PROCESSED BY BLOOD BANK Performed at [facility], [address] [place], [place] [address]   ABO/Rh     Status: None   Collection Time: 02/21/24  8:07 PM  Result Value Ref Range   ABO/RH(D)      O POS Performed at [facility], [address], [place], [place] [address]   Glucose, capillary     Status: None   Collection Time: 02/21/24  9:47 PM  Result Value Ref Range   Glucose-Capillary 99 70 - 99 mg/dL    Comment: Glucose reference range applies only to samples taken after fasting for at least 8 hours.  Hemoglobin and hematocrit, blood     Status: Abnormal   Collection Time: 02/22/24 12:29 AM  Result Value Ref Range   Hemoglobin 5.5 (LL) 13.0 - 17.0 g/dL    Comment: CRITICAL  VALUE NOTED.  VALUE IS CONSISTENT WITH PREVIOUSLY REPORTED AND CALLED VALUE. REPEATED TO VERIFY    HCT 18.8 (L) 39.0 - 52.0 %    Comment: Performed at [facility], [address] [place], [place] [address]  Basic metabolic panel     Status: Abnormal   Collection Time: 02/22/24 12:29 AM  Result Value Ref Range   Sodium 140 135 - 145 mmol/L   Potassium 6.3 (HH) 3.5 - 5.1 mmol/L    Comment: REPEATED TO VERIFY CRITICAL RESULT CALLED TO, READ BACK BY AND VERIFIED WITH BRADSHAW, J. RN @0137  02/22/24 SATRAINR    Chloride 112 (H) 98 - 111 mmol/L   CO2 22 22 - 32 mmol/L   Glucose, Bld 141 (H) 70 - 99 mg/dL    Comment: Glucose reference range applies only to samples taken after fasting for at least 8 hours.   BUN 65 (H) 8 - 23 mg/dL   Creatinine, Ser 6.04 (H) 0.61 - 1.24 mg/dL   Calcium 8.2 (L) 8.9 - 10.3 mg/dL   GFR, Estimated 24 (L) >60 mL/min    Comment: (NOTE) Calculated using the CKD-EPI Creatinine Equation (2021)    Anion gap 6 5 - 15    Comment: Performed at [facility], [address], [place], [place] [address]  Lactic acid, plasma     Status: Abnormal   Collection Time: 02/22/24 12:29 AM  Result Value Ref Range   Lactic Acid, Venous 4.0 (HH) 0.5 - 1.9 mmol/L    Comment: CRITICAL RESULT CALLED TO, READ BACK BY AND VERIFIED WITH Kallie Locks RN @0119  02/22/2024 S. BYRD Performed at [facility], [address] [place], [place] [address]   Glucose, capillary     Status: Abnormal   Collection Time: 02/22/24  7:58 AM  Result Value Ref Range   Glucose-Capillary 102 (H) 70 - 99 mg/dL    Comment: Glucose reference range applies only to samples taken after fasting for at least 8 hours.  Lactic acid, plasma     Status: Abnormal   Collection Time: 02/22/24 10:10 AM  Result Value Ref Range   Lactic Acid, Venous 2.5 (HH) 0.5 - 1.9 mmol/L    Comment: CRITICAL VALUE NOTED. VALUE IS CONSISTENT WITH PREVIOUSLY REPORTED/CALLED VALUE Performed at [facility], [address], [place], [place] [address]   CBC     Status: Abnormal   Collection Time: 02/22/24 10:10 AM  Result Value Ref Range   WBC 10.1 4.0 - 10.5 K/uL    RBC 3.07 (L) 4.22 - 5.81 MIL/uL   Hemoglobin 7.9 (L) 13.0 - 17.0 g/dL    Comment: REPEATED TO VERIFY POST TRANSFUSION SPECIMEN    HCT 25.0 (L) 39.0 - 52.0 %   MCV 81.4 80.0 - 100.0 fL   MCH 25.7 (L) 26.0 - 34.0 pg   MCHC 31.6 30.0 - 36.0 g/dL   RDW 62.1 (H) 30.8 - 65.7 %   Platelets 130 (L) 150 - 400 K/uL   nRBC 0.0 0.0 - 0.2 %    Comment: Performed at [facility], [address], [place], [place] [address]  Comprehensive metabolic panel     Status: Abnormal   Collection Time: 02/22/24 10:10 AM  Result Value Ref Range   Sodium 141 135 - 145 mmol/L   Potassium 5.5 (H) 3.5 - 5.1 mmol/L   Chloride 108 98 - 111 mmol/L   CO2 23 22 - 32 mmol/L   Glucose, Bld 102 (  H) 70 - 99 mg/dL    Comment: Glucose reference range applies only to samples taken after fasting for at least 8 hours.   BUN 56 (H) 8 - 23 mg/dL   Creatinine, Ser 9.56 (H) 0.61 - 1.24 mg/dL   Calcium 8.4 (L) 8.9 - 10.3 mg/dL   Total Protein 4.3 (L) 6.5 - 8.1 g/dL   Albumin 2.3 (L) 3.5 - 5.0 g/dL   AST 21 15 - 41 U/L   ALT 15 0 - 44 U/L   Alkaline Phosphatase 33 (L) 38 - 126 U/L   Total Bilirubin 0.7 0.0 - 1.2 mg/dL   GFR, Estimated 26 (L) >60 mL/min    Comment: (NOTE) Calculated using the CKD-EPI Creatinine Equation (2021)    Anion gap 10 5 - 15    Comment: Performed at [facility], [address] [place], [place] [address]  I-STAT, Kristeen Peto 8     Status: Abnormal   Collection Time: 02/22/24 11:12 AM  Result Value Ref Range   Sodium 141 135 - 145 mmol/L   Potassium 5.1 3.5 - 5.1 mmol/L   Chloride 110 98 - 111 mmol/L   BUN 48 (H) 8 - 23 mg/dL   Creatinine, Ser 6.57 (H) 0.61 - 1.24 mg/dL   Glucose, Bld 95 70 - 99 mg/dL    Comment: Glucose reference range applies only to samples taken after fasting for at least 8 hours.   Calcium, Ion 1.16 1.15 - 1.40 mmol/L   TCO2 21 (L) 22 - 32 mmol/L   Hemoglobin 7.8 (L) 13.0 - 17.0 g/dL   HCT 84.6 (L) 96.2 - 95.2 %  Prepare RBC (crossmatch)     Status: None    Collection Time: 02/22/24 12:31 PM  Result Value Ref Range   Order Confirmation      ORDER PROCESSED BY BLOOD BANK Performed at [facility], [address], [place], [place] [address]   Lactic acid, plasma     Status: Abnormal   Collection Time: 02/22/24  3:46 PM  Result Value Ref Range   Lactic Acid, Venous 2.5 (HH) 0.5 - 1.9 mmol/L    Comment: CRITICAL VALUE NOTED. VALUE IS CONSISTENT WITH PREVIOUSLY REPORTED/CALLED VALUE Performed at [facility], [address] [place], [place] [address]   Basic metabolic panel with GFR     Status: Abnormal   Collection Time: 02/22/24  3:46 PM  Result Value Ref Range   Sodium 143 135 - 145 mmol/L   Potassium 5.7 (H) 3.5 - 5.1 mmol/L   Chloride 111 98 - 111 mmol/L   CO2 25 22 - 32 mmol/L   Glucose, Bld 120 (H) 70 - 99 mg/dL    Comment: Glucose reference range applies only to samples taken after fasting for at least 8 hours.   BUN 49 (H) 8 - 23 mg/dL   Creatinine, Ser 2.72 (H) 0.61 - 1.24 mg/dL   Calcium 8.6 (L) 8.9 - 10.3 mg/dL   GFR, Estimated 26 (L) >60 mL/min    Comment: (NOTE) Calculated using the CKD-EPI Creatinine Equation (2021)    Anion gap 7 5 - 15    Comment: Performed at [facility], [address], [place], [place] [address]  Glucose, capillary     Status: Abnormal   Collection Time: 02/22/24  4:45 PM  Result Value Ref Range   Glucose-Capillary 109 (H) 70 - 99 mg/dL    Comment: Glucose reference range applies only to samples taken after fasting  for at least 8 hours.  Glucose, capillary     Status: Abnormal   Collection Time: 02/22/24  7:40 PM  Result Value Ref Range   Glucose-Capillary 105 (H) 70 - 99 mg/dL    Comment: Glucose reference range applies only to samples taken after fasting for at least 8 hours.   Comment 1 Notify RN    Comment 2 Document in Chart   CBC     Status: Abnormal   Collection Time: 02/23/24 12:47 AM  Result Value Ref Range   WBC 8.8 4.0 - 10.5 K/uL   RBC 3.19 (L) 4.22 - 5.81  MIL/uL   Hemoglobin 7.9 (L) 13.0 - 17.0 g/dL   HCT 78.2 (L) 95.6 - 21.3 %   MCV 80.9 80.0 - 100.0 fL   MCH 24.8 (L) 26.0 - 34.0 pg   MCHC 30.6 30.0 - 36.0 g/dL   RDW 08.6 (H) 57.8 - 46.9 %   Platelets 127 (L) 150 - 400 K/uL   nRBC 0.0 0.0 - 0.2 %    Comment: Performed at [facility], [address], [place], [place] [address]  Urinalysis, Routine w reflex microscopic -Urine, Clean Catch     Status: None   Collection Time: 02/23/24  6:35 AM  Result Value Ref Range   Color, Urine YELLOW YELLOW   APPearance CLEAR CLEAR   Specific Gravity, Urine 1.009 1.005 - 1.030   pH 6.0 5.0 - 8.0   Glucose, UA NEGATIVE NEGATIVE mg/dL   Hgb urine dipstick NEGATIVE NEGATIVE   Bilirubin Urine NEGATIVE NEGATIVE   Ketones, ur NEGATIVE NEGATIVE mg/dL   Protein, ur NEGATIVE NEGATIVE mg/dL   Nitrite NEGATIVE NEGATIVE   Leukocytes,Ua NEGATIVE NEGATIVE    Comment: Performed at [facility], [address], [place], [place] [address]  Sodium, urine, random     Status: None   Collection Time: 02/23/24  6:35 AM  Result Value Ref Range   Sodium, Ur 83 mmol/L    Comment: Performed at [facility], [address] [place], [place] [address]  Creatinine, urine, random     Status: None   Collection Time: 02/23/24  6:35 AM  Result Value Ref Range   Creatinine, Urine 48 mg/dL    Comment: Performed at [facility], [address], [place], [place] [address]  CBC     Status: Abnormal   Collection Time: 02/23/24  6:57 AM  Result Value Ref Range   WBC 8.2 4.0 - 10.5 K/uL   RBC 3.27 (L) 4.22 - 5.81 MIL/uL   Hemoglobin 8.2 (L) 13.0 - 17.0 g/dL   HCT 66.4 (L) 40.3 - 47.4 %   MCV 82.0 80.0 - 100.0 fL   MCH 25.1 (L) 26.0 - 34.0 pg   MCHC 30.6 30.0 - 36.0 g/dL   RDW 25.9 (H) 56.3 - 87.5 %   Platelets 122 (L) 150 - 400 K/uL   nRBC 0.0 0.0 - 0.2 %    Comment: Performed at [facility], [address], [place], [place] [address]  Glucose, capillary     Status: None   Collection Time:  02/23/24  8:43 AM  Result Value Ref Range   Glucose-Capillary 76 70 - 99 mg/dL    Comment: Glucose reference range applies only to samples taken after fasting for at least 8 hours.  Glucose, capillary     Status: None   Collection Time: 02/23/24 11:43 AM  Result Value Ref Range   Glucose-Capillary  92 70 - 99 mg/dL    Comment: Glucose reference range applies only to samples taken after fasting for at least 8 hours.  Comprehensive metabolic panel with GFR     Status: Abnormal   Collection Time: 02/23/24 12:47 PM  Result Value Ref Range   Sodium 140 135 - 145 mmol/L   Potassium 5.3 (H) 3.5 - 5.1 mmol/L   Chloride 110 98 - 111 mmol/L   CO2 22 22 - 32 mmol/L   Glucose, Bld 82 70 - 99 mg/dL    Comment: Glucose reference range applies only to samples taken after fasting for at least 8 hours.   BUN 39 (H) 8 - 23 mg/dL   Creatinine, Ser 5.62 (H) 0.61 - 1.24 mg/dL   Calcium 8.8 (L) 8.9 - 10.3 mg/dL   Total Protein 5.3 (L) 6.5 - 8.1 g/dL   Albumin 2.8 (L) 3.5 - 5.0 g/dL   AST 31 15 - 41 U/L   ALT 21 0 - 44 U/L   Alkaline Phosphatase 46 38 - 126 U/L   Total Bilirubin 0.8 0.0 - 1.2 mg/dL   GFR, Estimated 30 (L) >60 mL/min    Comment: (NOTE) Calculated using the CKD-EPI Creatinine Equation (2021)    Anion gap 8 5 - 15    Comment: Performed at [facility], [address], [place], [place] [address]    Studies/Results: VAS [place] LOWER EXTREMITY VENOUS (DVT) Result Date: 02/22/2024  Lower Venous DVT Study Patient Name:  Jerry [name].  Date of Exam:   02/22/2024 Medical Rec #: [id number]          Accession #:    [id number] Date of Birth: [birth year]           Patient Gender: M Patient Age:   20 years Exam Location:  [facility] Procedure:      VAS [place] LOWER EXTREMITY VENOUS (DVT) Referring Phys: Tereasa Coop --------------------------------------------------------------------------------  Indications: History left popliteal acute DVT on 02/17/24 - Follow up study.  Risk Factors: DVT  02/17/24. Performing Technologist: Sierraville Sink Sturdivant-Jones RDMS, RVT  Examination Guidelines: A complete evaluation includes B-mode imaging, spectral Doppler, color Doppler, and power Doppler as needed of all accessible portions of each vessel. Bilateral testing is considered an integral part of a complete examination. Limited examinations for reoccurring indications may be performed as noted. The reflux portion of the exam is performed with the patient in reverse Trendelenburg.  +---------+---------------+---------+-----------+----------+--------------+ LEFT     CompressibilityPhasicitySpontaneityPropertiesThrombus Aging +---------+---------------+---------+-----------+----------+--------------+ CFV      Full           Yes      Yes                                 +---------+---------------+---------+-----------+----------+--------------+ SFJ      Full                                                        +---------+---------------+---------+-----------+----------+--------------+ FV Prox  Full                                                        +---------+---------------+---------+-----------+----------+--------------+  FV Mid   Full                                                        +---------+---------------+---------+-----------+----------+--------------+ FV DistalFull                                                        +---------+---------------+---------+-----------+----------+--------------+ PFV      Full                                                        +---------+---------------+---------+-----------+----------+--------------+ POP      Partial        Yes      Yes                  Acute          +---------+---------------+---------+-----------+----------+--------------+ PTV      Full                                                        +---------+---------------+---------+-----------+----------+--------------+ PERO     Full                                                         +---------+---------------+---------+-----------+----------+--------------+     Summary: LEFT: - Findings consistent with acute deep vein thrombosis involving the left popliteal vein.  - Findings appear essentially unchanged compared to previous examination.  *See table(s) above for measurements and observations. Electronically signed by Irvin Mantel on 02/22/2024 at 9:00:00 PM.    Final    ECHOCARDIOGRAM COMPLETE Result Date: 02/22/2024    ECHOCARDIOGRAM LIMITED REPORT   Patient Name:   Jerry [name]. Date of Exam: 02/22/2024 Medical Rec #:  [id number]         Height:       71.0 in Accession #:    [id number]        Weight:       169.1 lb Date of Birth:  1941/07/12          BSA:          1.963 m Patient Age:    82 years          BP:           102/54 mmHg Patient Gender: M                 HR:           97 bpm. Exam Location:  Inpatient Procedure: 2D Echo, Cardiac Doppler and Color Doppler (Both Spectral and Color            Flow Doppler were utilized during procedure).  Indications:    Pulmonary Embolus I26.09  History:        Patient has prior history of Echocardiogram examinations, most                 recent 01/07/2024.  Sonographer:    Hersey Lorenzo RDCS Referring Phys: [phone] SUBRINA SUNDIL IMPRESSIONS  1. Left ventricular ejection fraction, by estimation, is 70 to 75%. The left ventricle has hyperdynamic function. The left ventricle has no regional wall motion abnormalities. There is mild concentric left ventricular hypertrophy. There is the interventricular septum is flattened in systole, consistent with right ventricular pressure overload.  2. Right ventricular systolic function is low normal. The right ventricular size is normal.  3. Right atrial size was mildly dilated.  4. The mitral valve is normal in structure. No evidence of mitral valve regurgitation. No evidence of mitral stenosis.  5. The aortic valve is tricuspid. There is moderate calcification  of the aortic valve. Aortic valve regurgitation is not visualized. Aortic valve sclerosis/calcification is present, without any evidence of aortic stenosis.  6. The inferior vena cava is normal in size with greater than 50% respiratory variability, suggesting right atrial pressure of 3 mmHg. Conclusion(s)/Recommendation(s): Full study unable to be loaded currently. FINDINGS  Left Ventricle: Left ventricular ejection fraction, by estimation, is 70 to 75%. The left ventricle has hyperdynamic function. The left ventricle has no regional wall motion abnormalities. The left ventricular internal cavity size was normal in size. There is mild concentric left ventricular hypertrophy. The interventricular septum is flattened in systole, consistent with right ventricular pressure overload. Right Ventricle: The right ventricular size is normal. No increase in right ventricular wall thickness. Right ventricular systolic function is low normal. Left Atrium: Left atrial size was normal in size. Right Atrium: Right atrial size was mildly dilated. Pericardium: Trivial pericardial effusion is present. The pericardial effusion is posterior to the left ventricle. Mitral Valve: The mitral valve is normal in structure. No evidence of mitral valve stenosis. Tricuspid Valve: The tricuspid valve is normal in structure. Tricuspid valve regurgitation is trivial. No evidence of tricuspid stenosis. Aortic Valve: The aortic valve is tricuspid. There is moderate calcification of the aortic valve. Aortic valve regurgitation is not visualized. Aortic valve sclerosis/calcification is present, without any evidence of aortic stenosis. Pulmonic Valve: The pulmonic valve was normal in structure. Pulmonic valve regurgitation is trivial. No evidence of pulmonic stenosis. Aorta: The aortic root is normal in size and structure. Venous: The inferior vena cava is normal in size with greater than 50% respiratory variability, suggesting right atrial pressure of  3 mmHg. IAS/Shunts: No atrial level shunt detected by color flow Doppler. LEFT VENTRICLE PLAX 2D LVIDd:         4.70 cm   Diastology LVIDs:         2.40 cm   LV e' lateral:   6.85 cm/s LV PW:         0.90 cm   LV E/e' lateral: 12.0 LV IVS:        0.80 cm LVOT diam:     2.00 cm LV SV:         74 LV SV Index:   37 LVOT Area:     3.14 cm  RIGHT VENTRICLE RV S prime:     15.20 cm/s TAPSE (M-mode): 2.1 cm LEFT ATRIUM             Index        RIGHT ATRIUM  Index LA diam:        2.60 cm 1.32 cm/m   RA Area:     23.30 cm LA Vol (A2C):   49.2 ml 25.06 ml/m  RA Volume:   66.60 ml  33.92 ml/m LA Vol (A4C):   60.6 ml 30.86 ml/m LA Biplane Vol: 56.1 ml 28.57 ml/m  AORTIC VALVE LVOT Vmax:   120.00 cm/s LVOT Vmean:  84.400 cm/s LVOT VTI:    0.234 m  AORTA Ao Root diam: 3.00 cm Ao Asc diam:  3.00 cm MITRAL VALVE MV Area (PHT): 5.50 cm     SHUNTS MV Decel Time: 138 msec     Systemic VTI:  0.23 m MV E velocity: 82.30 cm/s   Systemic Diam: 2.00 cm MV A velocity: 111.00 cm/s MV E/A ratio:  0.74 Jerry [name] Electronically signed by Jerry [name] Signature Date/Time: 02/22/2024/10:48:35 AM    Final    HYBRID OR IMAGING ([facility] ONLY) Result Date: 02/22/2024 There is no interpretation for this exam.  This order is for images obtained during a surgical procedure.  Please See "Surgeries" Tab for more information regarding the procedure.   DG Chest Port 1 View Result Date: 02/21/2024 CLINICAL DATA:  GI bleed EXAM: PORTABLE CHEST 1 VIEW COMPARISON:  02/15/2024 FINDINGS: Probable small left effusion with mild atelectasis or infiltrate persisting at the left base. Stable cardiomediastinal silhouette. No pneumothorax IMPRESSION: Probable small left effusion with mild atelectasis or infiltrate at the left base. Electronically Signed   By: Esmeralda Hedge M.D.   On: 02/21/2024 20:33    Medications: I have reviewed the patient's current medications.  Assessment: GI bleed, anemia, hypovolemic shock, history of gastric and  colonic AVMs  S/p 3 units of PRBC on 02/22/24 and 1 unit of PRBC on 02/21/24 Hemoglobin on admission was 6.2 subsequently 5.5/7.9/7.8/7.9/8.2  EGD/10/25, melena, Dr. Brahmbhatt:nonobstructing Schatzki's ring, small hiatal hernia, no evidence of bleeding  Colonoscopy 10/09/2023, Dr. Veronda Goody: Multiple AVM in ascending and cecum, status posttreatment with APC, inadequate bowel prep  EGD 10/09/2023, Dr. Veronda Goody: Multiple recently bleeding AVMs in the stomach treated with APC  1 episode of maroon-colored stool overnight but today morning had liquid brown stools EGD was unremarkable No longer on anticoagulation, plan for lifelong IVC filter placement  Other comorbidities: Due to on chronic kidney injury, hyperkalemia, history of asthma, pulmonary hypertension, COPD, diabetes, history of recent PE and left popliteal DVT, hypertension, nonobstructive coronary artery disease, dementia, dyslipidemia  Plan: Since patient had liquid brown stool yesterday morning without evidence of further bleeding, would not plan on monitoring. Discussed this with patient, and his wife at bedside. If there is evidence of further GI bleed or drop in hemoglobin, will plan colonoscopy thereafter. He will require 2-day prep, patient states he will not be able to finish colonic prep in a day if needed and was noted to have inadequate prep on his last colonoscopy from 11/24. I have changed his pantoprazole to once a day from twice a day. Will see him in a.m. tomorrow.   Genell Ken, MD 02/23/2024, 2:37 PM

## 2024-02-24 DIAGNOSIS — D649 Anemia, unspecified: Secondary | ICD-10-CM

## 2024-02-24 DIAGNOSIS — N184 Chronic kidney disease, stage 4 (severe): Secondary | ICD-10-CM | POA: Diagnosis not present

## 2024-02-24 DIAGNOSIS — K922 Gastrointestinal hemorrhage, unspecified: Secondary | ICD-10-CM | POA: Diagnosis not present

## 2024-02-24 DIAGNOSIS — E875 Hyperkalemia: Secondary | ICD-10-CM | POA: Diagnosis not present

## 2024-02-24 LAB — BASIC METABOLIC PANEL WITH GFR
Anion gap: 8 (ref 5–15)
BUN: 27 mg/dL — ABNORMAL HIGH (ref 8–23)
CO2: 25 mmol/L (ref 22–32)
Calcium: 8.7 mg/dL — ABNORMAL LOW (ref 8.9–10.3)
Chloride: 109 mmol/L (ref 98–111)
Creatinine, Ser: 2.01 mg/dL — ABNORMAL HIGH (ref 0.61–1.24)
GFR, Estimated: 33 mL/min — ABNORMAL LOW (ref >60)
Glucose, Bld: 98 mg/dL (ref 70–99)
Potassium: 4.1 mmol/L (ref 3.5–5.1)
Sodium: 142 mmol/L (ref 135–145)

## 2024-02-24 LAB — CBC
HCT: 27.6 % — ABNORMAL LOW (ref 39.0–52.0)
Hemoglobin: 8.4 g/dL — ABNORMAL LOW (ref 13.0–17.0)
MCH: 25.1 pg — ABNORMAL LOW (ref 26.0–34.0)
MCHC: 30.4 g/dL (ref 30.0–36.0)
MCV: 82.6 fL (ref 80.0–100.0)
Platelets: 118 10*3/uL — ABNORMAL LOW (ref 150–400)
RBC: 3.34 MIL/uL — ABNORMAL LOW (ref 4.22–5.81)
RDW: 19.9 % — ABNORMAL HIGH (ref 11.5–15.5)
WBC: 7.1 10*3/uL (ref 4.0–10.5)
nRBC: 0 % (ref 0.0–0.2)

## 2024-02-24 LAB — GLUCOSE, CAPILLARY
Glucose-Capillary: 90 mg/dL (ref 70–99)
Glucose-Capillary: 127 mg/dL — ABNORMAL HIGH (ref 70–99)

## 2024-02-24 MED ORDER — METOPROLOL TARTRATE 50 MG PO TABS
50.0000 mg | ORAL_TABLET | Freq: Two times a day (BID) | ORAL | Status: DC
  Administered 2024-02-24: 50 mg via ORAL
  Filled 2024-02-24: qty 1

## 2024-02-24 NOTE — Progress Notes (Signed)
 Subjective: Patient reports that his bowel movements have been brown. He has not noted any blood in stool or black stools.  Objective: Vital signs in last 24 hours: Temp:  [98.2 F (36.8 C)-99.6 F (37.6 C)] 99.1 F (37.3 C) (04/13 0751) Pulse Rate:  [85-94] 86 (04/13 0751) Resp:  [12-22] 20 (04/13 0751) BP: (130-160)/(71-87) 160/79 (04/13 0751) SpO2:  [94 %-100 %] 99 % (04/13 0751) Weight change:  Last BM Date : 02/22/24  PE: On oxygen by nasal cannula GENERAL: Not in distress, mild pallor  ABDOMEN: Soft, nondistended, nontender abdomen EXTREMITIES: No deformity  Lab Results: Results for orders placed or performed during the hospital encounter of 02/21/24 (from the past 48 hours)  Lactic acid, plasma     Status: Abnormal   Collection Time: 02/22/24 10:10 AM  Result Value Ref Range   Lactic Acid, Venous 2.5 (HH) 0.5 - 1.9 mmol/L    Comment: CRITICAL VALUE NOTED. VALUE IS CONSISTENT WITH PREVIOUSLY REPORTED/CALLED VALUE Performed at [facility], [address], [place], [place] [address]   CBC     Status: Abnormal   Collection Time: 02/22/24 10:10 AM  Result Value Ref Range   WBC 10.1 4.0 - 10.5 K/uL   RBC 3.07 (L) 4.22 - 5.81 MIL/uL   Hemoglobin 7.9 (L) 13.0 - 17.0 g/dL    Comment: REPEATED TO VERIFY POST TRANSFUSION SPECIMEN    HCT 25.0 (L) 39.0 - 52.0 %   MCV 81.4 80.0 - 100.0 fL   MCH 25.7 (L) 26.0 - 34.0 pg   MCHC 31.6 30.0 - 36.0 g/dL   RDW 86.5 (H) 78.4 - 69.6 %   Platelets 130 (L) 150 - 400 K/uL   nRBC 0.0 0.0 - 0.2 %    Comment: Performed at [facility], [address] [place], [place] [address]  Comprehensive metabolic panel     Status: Abnormal   Collection Time: 02/22/24 10:10 AM  Result Value Ref Range   Sodium 141 135 - 145 mmol/L   Potassium 5.5 (H) 3.5 - 5.1 mmol/L   Chloride 108 98 - 111 mmol/L   CO2 23 22 - 32 mmol/L   Glucose, Bld 102 (H) 70 - 99 mg/dL    Comment: Glucose reference range applies only to samples taken after  fasting for at least 8 hours.   BUN 56 (H) 8 - 23 mg/dL   Creatinine, Ser 4.13 (H) 0.61 - 1.24 mg/dL   Calcium 8.4 (L) 8.9 - 10.3 mg/dL   Total Protein 4.3 (L) 6.5 - 8.1 g/dL   Albumin 2.3 (L) 3.5 - 5.0 g/dL   AST 21 15 - 41 U/L   ALT 15 0 - 44 U/L   Alkaline Phosphatase 33 (L) 38 - 126 U/L   Total Bilirubin 0.7 0.0 - 1.2 mg/dL   GFR, Estimated 26 (L) >60 mL/min    Comment: (NOTE) Calculated using the CKD-EPI Creatinine Equation (2021)    Anion gap 10 5 - 15    Comment: Performed at [facility], [address] [place], [place] [address]  I-STAT, [place] 8     Status: Abnormal   Collection Time: 02/22/24 11:12 AM  Result Value Ref Range   Sodium 141 135 - 145 mmol/L   Potassium 5.1 3.5 - 5.1 mmol/L   Chloride 110 98 - 111 mmol/L   BUN 48 (H) 8 - 23 mg/dL   Creatinine, Ser 0.27 (H) 0.61 - 1.24 mg/dL   Glucose, Bld 95 70 -  99 mg/dL    Comment: Glucose reference range applies only to samples taken after fasting for at least 8 hours.   Calcium, Ion 1.16 1.15 - 1.40 mmol/L   TCO2 21 (L) 22 - 32 mmol/L   Hemoglobin 7.8 (L) 13.0 - 17.0 g/dL   HCT 52.8 (L) 41.3 - 24.4 %  Prepare RBC (crossmatch)     Status: None   Collection Time: 02/22/24 12:31 PM  Result Value Ref Range   Order Confirmation      ORDER PROCESSED BY BLOOD BANK Performed at [facility], [address], [place], [place] [address]   Lactic acid, plasma     Status: Abnormal   Collection Time: 02/22/24  3:46 PM  Result Value Ref Range   Lactic Acid, Venous 2.5 (HH) 0.5 - 1.9 mmol/L    Comment: CRITICAL VALUE NOTED. VALUE IS CONSISTENT WITH PREVIOUSLY REPORTED/CALLED VALUE Performed at [facility], [address], [place], [place] [address]   Basic metabolic panel with GFR     Status: Abnormal   Collection Time: 02/22/24  3:46 PM  Result Value Ref Range   Sodium 143 135 - 145 mmol/L   Potassium 5.7 (H) 3.5 - 5.1 mmol/L   Chloride 111 98 - 111 mmol/L   CO2 25 22 - 32 mmol/L   Glucose, Bld 120 (H)  70 - 99 mg/dL    Comment: Glucose reference range applies only to samples taken after fasting for at least 8 hours.   BUN 49 (H) 8 - 23 mg/dL   Creatinine, Ser 4.40 (H) 0.61 - 1.24 mg/dL   Calcium 8.6 (L) 8.9 - 10.3 mg/dL   GFR, Estimated 26 (L) >60 mL/min    Comment: (NOTE) Calculated using the CKD-EPI Creatinine Equation (2021)    Anion gap 7 5 - 15    Comment: Performed at [facility], [address], [place], [place] [address]  Glucose, capillary     Status: Abnormal   Collection Time: 02/22/24  4:45 PM  Result Value Ref Range   Glucose-Capillary 109 (H) 70 - 99 mg/dL    Comment: Glucose reference range applies only to samples taken after fasting for at least 8 hours.  Glucose, capillary     Status: Abnormal   Collection Time: 02/22/24  7:40 PM  Result Value Ref Range   Glucose-Capillary 105 (H) 70 - 99 mg/dL    Comment: Glucose reference range applies only to samples taken after fasting for at least 8 hours.   Comment 1 Notify RN    Comment 2 Document in Chart   CBC     Status: Abnormal   Collection Time: 02/23/24 12:47 AM  Result Value Ref Range   WBC 8.8 4.0 - 10.5 K/uL   RBC 3.19 (L) 4.22 - 5.81 MIL/uL   Hemoglobin 7.9 (L) 13.0 - 17.0 g/dL   HCT 59.5 (L) 63.8 - 75.6 %   MCV 80.9 80.0 - 100.0 fL   MCH 24.8 (L) 26.0 - 34.0 pg   MCHC 30.6 30.0 - 36.0 g/dL   RDW 43.3 (H) 29.5 - 18.8 %   Platelets 127 (L) 150 - 400 K/uL   nRBC 0.0 0.0 - 0.2 %    Comment: Performed at [facility], [address] [place], [place] [address]  Urinalysis, Routine w reflex microscopic -Urine, Clean Catch     Status: None   Collection Time: 02/23/24  6:35 AM  Result Value Ref Range   Color,  Urine YELLOW YELLOW   APPearance CLEAR CLEAR   Specific Gravity, Urine 1.009 1.005 - 1.030   pH 6.0 5.0 - 8.0   Glucose, UA NEGATIVE NEGATIVE mg/dL   Hgb urine dipstick NEGATIVE NEGATIVE   Bilirubin Urine NEGATIVE NEGATIVE   Ketones, ur NEGATIVE NEGATIVE mg/dL   Protein, ur NEGATIVE  NEGATIVE mg/dL   Nitrite NEGATIVE NEGATIVE   Leukocytes,Ua NEGATIVE NEGATIVE    Comment: Performed at [facility], [address] [place], [place] [address]  Sodium, urine, random     Status: None   Collection Time: 02/23/24  6:35 AM  Result Value Ref Range   Sodium, Ur 83 mmol/L    Comment: Performed at [facility], [address], [place], [place] [address]  Creatinine, urine, random     Status: None   Collection Time: 02/23/24  6:35 AM  Result Value Ref Range   Creatinine, Urine 48 mg/dL    Comment: Performed at [facility], [address] [place], [place] [address]  CBC     Status: Abnormal   Collection Time: 02/23/24  6:57 AM  Result Value Ref Range   WBC 8.2 4.0 - 10.5 K/uL   RBC 3.27 (L) 4.22 - 5.81 MIL/uL   Hemoglobin 8.2 (L) 13.0 - 17.0 g/dL   HCT 46.9 (L) 62.9 - 52.8 %   MCV 82.0 80.0 - 100.0 fL   MCH 25.1 (L) 26.0 - 34.0 pg   MCHC 30.6 30.0 - 36.0 g/dL   RDW 41.3 (H) 24.4 - 01.0 %   Platelets 122 (L) 150 - 400 K/uL   nRBC 0.0 0.0 - 0.2 %    Comment: Performed at [facility], [address] [place], [place] [address]  Glucose, capillary     Status: None   Collection Time: 02/23/24  8:43 AM  Result Value Ref Range   Glucose-Capillary 76 70 - 99 mg/dL    Comment: Glucose reference range applies only to samples taken after fasting for at least 8 hours.  Glucose, capillary     Status: None   Collection Time: 02/23/24 11:43 AM  Result Value Ref Range   Glucose-Capillary 92 70 - 99 mg/dL    Comment: Glucose reference range applies only to samples taken after fasting for at least 8 hours.  Comprehensive metabolic panel with GFR     Status: Abnormal   Collection Time: 02/23/24 12:47 PM  Result Value Ref Range   Sodium 140 135 - 145 mmol/L   Potassium 5.3 (H) 3.5 - 5.1 mmol/L   Chloride 110 98 - 111 mmol/L   CO2 22 22 - 32 mmol/L   Glucose, Bld 82 70 - 99 mg/dL    Comment: Glucose reference range applies only to samples taken after fasting  for at least 8 hours.   BUN 39 (H) 8 - 23 mg/dL   Creatinine, Ser 6.64 (H) 0.61 - 1.24 mg/dL   Calcium 8.8 (L) 8.9 - 10.3 mg/dL   Total Protein 5.3 (L) 6.5 - 8.1 g/dL   Albumin 2.8 (L) 3.5 - 5.0 g/dL   AST 31 15 - 41 U/L   ALT 21 0 - 44 U/L   Alkaline Phosphatase 46 38 - 126 U/L   Total Bilirubin 0.8 0.0 - 1.2 mg/dL   GFR, Estimated 30 (L) >60 mL/min    Comment: (NOTE) Calculated using the CKD-EPI Creatinine Equation (2021)    Anion gap 8 5 - 15    Comment: Performed  at [facility], [address], [place], [place] [address]  CBC     Status: Abnormal   Collection Time: 02/23/24  4:27 PM  Result Value Ref Range   WBC 8.9 4.0 - 10.5 K/uL   RBC 3.56 (L) 4.22 - 5.81 MIL/uL   Hemoglobin 8.9 (L) 13.0 - 17.0 g/dL   HCT 81.1 (L) 91.4 - 78.2 %   MCV 82.9 80.0 - 100.0 fL   MCH 25.0 (L) 26.0 - 34.0 pg   MCHC 30.2 30.0 - 36.0 g/dL   RDW 95.6 (H) 21.3 - 08.6 %   Platelets 146 (L) 150 - 400 K/uL   nRBC 0.0 0.0 - 0.2 %    Comment: Performed at [facility], [address], [place], [place] [address]  Glucose, capillary     Status: Abnormal   Collection Time: 02/23/24  4:51 PM  Result Value Ref Range   Glucose-Capillary 122 (H) 70 - 99 mg/dL    Comment: Glucose reference range applies only to samples taken after fasting for at least 8 hours.  Glucose, capillary     Status: None   Collection Time: 02/23/24  9:14 PM  Result Value Ref Range   Glucose-Capillary 95 70 - 99 mg/dL    Comment: Glucose reference range applies only to samples taken after fasting for at least 8 hours.  Glucose, capillary     Status: None   Collection Time: 02/23/24 10:14 PM  Result Value Ref Range   Glucose-Capillary 82 70 - 99 mg/dL    Comment: Glucose reference range applies only to samples taken after fasting for at least 8 hours.  CBC     Status: Abnormal   Collection Time: 02/24/24  5:37 AM  Result Value Ref Range   WBC 7.1 4.0 - 10.5 K/uL   RBC 3.34 (L) 4.22 - 5.81 MIL/uL   Hemoglobin 8.4  (L) 13.0 - 17.0 g/dL   HCT 96.2 (L) 95.2 - 84.1 %   MCV 82.6 80.0 - 100.0 fL   MCH 25.1 (L) 26.0 - 34.0 pg   MCHC 30.4 30.0 - 36.0 g/dL   RDW 32.4 (H) 40.1 - 02.7 %   Platelets 118 (L) 150 - 400 K/uL   nRBC 0.0 0.0 - 0.2 %    Comment: Performed at [facility], [address], [place], [place] [address]  Basic metabolic panel with GFR     Status: Abnormal   Collection Time: 02/24/24  5:37 AM  Result Value Ref Range   Sodium 142 135 - 145 mmol/L   Potassium 4.1 3.5 - 5.1 mmol/L   Chloride 109 98 - 111 mmol/L   CO2 25 22 - 32 mmol/L   Glucose, Bld 98 70 - 99 mg/dL    Comment: Glucose reference range applies only to samples taken after fasting for at least 8 hours.   BUN 27 (H) 8 - 23 mg/dL   Creatinine, Ser 4.40 (H) 0.61 - 1.24 mg/dL   Calcium 8.7 (L) 8.9 - 10.3 mg/dL   GFR, Estimated 33 (L) >60 mL/min    Comment: (NOTE) Calculated using the CKD-EPI Creatinine Equation (2021)    Anion gap 8 5 - 15    Comment: Performed at [facility], [address] [place], [place] [address]  Glucose, capillary     Status: None   Collection Time: 02/24/24  7:51 AM  Result Value Ref Range   Glucose-Capillary 90 70 - 99 mg/dL  Comment: Glucose reference range applies only to samples taken after fasting for at least 8 hours.    Studies/Results: VAS US  LOWER EXTREMITY VENOUS (DVT) Result Date: 02/22/2024  Lower Venous DVT Study Patient Name:  Paiden Cavell.  Date of Exam:   02/22/2024 Medical Rec #: [id number]          Accession #:    [id number] Date of Birth: Dec 15, 1940           Patient Gender: M Patient Age:   83 years Exam Location:  [facility] Procedure:      VAS US  LOWER EXTREMITY VENOUS (DVT) Referring Phys: Jetty Mort --------------------------------------------------------------------------------  Indications: History left popliteal acute DVT on 02/17/24 - Follow up study.  Risk Factors: DVT 02/17/24. Performing Technologist: Franky Ivanoff Sturdivant-Jones RDMS, RVT  Examination  Guidelines: A complete evaluation includes B-mode imaging, spectral Doppler, color Doppler, and power Doppler as needed of all accessible portions of each vessel. Bilateral testing is considered an integral part of a complete examination. Limited examinations for reoccurring indications may be performed as noted. The reflux portion of the exam is performed with the patient in reverse Trendelenburg.  +---------+---------------+---------+-----------+----------+--------------+ LEFT     CompressibilityPhasicitySpontaneityPropertiesThrombus Aging +---------+---------------+---------+-----------+----------+--------------+ CFV      Full           Yes      Yes                                 +---------+---------------+---------+-----------+----------+--------------+ SFJ      Full                                                        +---------+---------------+---------+-----------+----------+--------------+ FV Prox  Full                                                        +---------+---------------+---------+-----------+----------+--------------+ FV Mid   Full                                                        +---------+---------------+---------+-----------+----------+--------------+ FV DistalFull                                                        +---------+---------------+---------+-----------+----------+--------------+ PFV      Full                                                        +---------+---------------+---------+-----------+----------+--------------+ POP      Partial        Yes      Yes  Acute          +---------+---------------+---------+-----------+----------+--------------+ PTV      Full                                                        +---------+---------------+---------+-----------+----------+--------------+ PERO     Full                                                         +---------+---------------+---------+-----------+----------+--------------+     Summary: LEFT: - Findings consistent with acute deep vein thrombosis involving the left popliteal vein.  - Findings appear essentially unchanged compared to previous examination.  *See table(s) above for measurements and observations. Electronically signed by Irvin Mantel on 02/22/2024 at 9:00:00 PM.    Final    ECHOCARDIOGRAM COMPLETE Result Date: 02/22/2024    ECHOCARDIOGRAM LIMITED REPORT   Patient Name:   Udell Mazzocco. Date of Exam: 02/22/2024 Medical Rec #:  [id number]         Height:       71.0 in Accession #:    [id number]        Weight:       169.1 lb Date of Birth:  Feb 08, 1941          BSA:          1.963 m Patient Age:    82 years          BP:           102/54 mmHg Patient Gender: M                 HR:           97 bpm. Exam Location:  Inpatient Procedure: 2D Echo, Cardiac Doppler and Color Doppler (Both Spectral and Color            Flow Doppler were utilized during procedure). Indications:    Pulmonary Embolus I26.09  History:        Patient has prior history of Echocardiogram examinations, most                 recent 01/07/2024.  Sonographer:    Hersey Lorenzo RDCS Referring Phys: [phone] SUBRINA SUNDIL IMPRESSIONS  1. Left ventricular ejection fraction, by estimation, is 70 to 75%. The left ventricle has hyperdynamic function. The left ventricle has no regional wall motion abnormalities. There is mild concentric left ventricular hypertrophy. There is the interventricular septum is flattened in systole, consistent with right ventricular pressure overload.  2. Right ventricular systolic function is low normal. The right ventricular size is normal.  3. Right atrial size was mildly dilated.  4. The mitral valve is normal in structure. No evidence of mitral valve regurgitation. No evidence of mitral stenosis.  5. The aortic valve is tricuspid. There is moderate calcification of the aortic valve. Aortic valve regurgitation is  not visualized. Aortic valve sclerosis/calcification is present, without any evidence of aortic stenosis.  6. The inferior vena cava is normal in size with greater than 50% respiratory variability, suggesting right atrial pressure of 3 mmHg. Conclusion(s)/Recommendation(s): Full study unable to be loaded currently. FINDINGS  Left Ventricle: Left ventricular ejection fraction,  by estimation, is 70 to 75%. The left ventricle has hyperdynamic function. The left ventricle has no regional wall motion abnormalities. The left ventricular internal cavity size was normal in size. There is mild concentric left ventricular hypertrophy. The interventricular septum is flattened in systole, consistent with right ventricular pressure overload. Right Ventricle: The right ventricular size is normal. No increase in right ventricular wall thickness. Right ventricular systolic function is low normal. Left Atrium: Left atrial size was normal in size. Right Atrium: Right atrial size was mildly dilated. Pericardium: Trivial pericardial effusion is present. The pericardial effusion is posterior to the left ventricle. Mitral Valve: The mitral valve is normal in structure. No evidence of mitral valve stenosis. Tricuspid Valve: The tricuspid valve is normal in structure. Tricuspid valve regurgitation is trivial. No evidence of tricuspid stenosis. Aortic Valve: The aortic valve is tricuspid. There is moderate calcification of the aortic valve. Aortic valve regurgitation is not visualized. Aortic valve sclerosis/calcification is present, without any evidence of aortic stenosis. Pulmonic Valve: The pulmonic valve was normal in structure. Pulmonic valve regurgitation is trivial. No evidence of pulmonic stenosis. Aorta: The aortic root is normal in size and structure. Venous: The inferior vena cava is normal in size with greater than 50% respiratory variability, suggesting right atrial pressure of 3 mmHg. IAS/Shunts: No atrial level shunt detected  by color flow Doppler. LEFT VENTRICLE PLAX 2D LVIDd:         4.70 cm   Diastology LVIDs:         2.40 cm   LV e' lateral:   6.85 cm/s LV PW:         0.90 cm   LV E/e' lateral: 12.0 LV IVS:        0.80 cm LVOT diam:     2.00 cm LV SV:         74 LV SV Index:   37 LVOT Area:     3.14 cm  RIGHT VENTRICLE RV S prime:     15.20 cm/s TAPSE (M-mode): 2.1 cm LEFT ATRIUM             Index        RIGHT ATRIUM           Index LA diam:        2.60 cm 1.32 cm/m   RA Area:     23.30 cm LA Vol (A2C):   49.2 ml 25.06 ml/m  RA Volume:   66.60 ml  33.92 ml/m LA Vol (A4C):   60.6 ml 30.86 ml/m LA Biplane Vol: 56.1 ml 28.57 ml/m  AORTIC VALVE LVOT Vmax:   120.00 cm/s LVOT Vmean:  84.400 cm/s LVOT VTI:    0.234 m  AORTA Ao Root diam: 3.00 cm Ao Asc diam:  3.00 cm MITRAL VALVE MV Area (PHT): 5.50 cm     SHUNTS MV Decel Time: 138 msec     Systemic VTI:  0.23 m MV E velocity: 82.30 cm/s   Systemic Diam: 2.00 cm MV A velocity: 111.00 cm/s MV E/A ratio:  0.74 Arta Lark Electronically signed by Arta Lark Signature Date/Time: 02/22/2024/10:48:35 AM    Final    HYBRID OR IMAGING ([facility] ONLY) Result Date: 02/22/2024 There is no interpretation for this exam.  This order is for images obtained during a surgical procedure.  Please See "Surgeries" Tab for more information regarding the procedure.    Medications: I have reviewed the patient's current medications.  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 No plans for colonoscopy.  Genell Ken, MD 02/24/2024, 9:58 AM

## 2024-02-24 NOTE — Discharge Summary (Signed)
 PATIENT DETAILS Name: Jerry [name]. Age: 83 y.o. Sex: male Date of Birth: Mar 07, 1941 MRN: [id number]. Admitting Physician: Subrina Sundil, MD WUJ:[id number], Jullie Oiler, MD  Admit Date: 02/21/2024 Discharge date: 02/24/2024  Recommendations for Outpatient Follow-up:  Follow up with PCP in 1-2 weeks Please obtain CMP/CBC in one week  Admitted From:  Home  Disposition: Home Health   Discharge Condition: good  CODE STATUS:   Code Status: Full Code   Diet recommendation:  Diet Order             Diet Heart Fluid consistency: Thin  Diet effective now           Diet - low sodium heart healthy           Diet Carb Modified                    Brief Summary: Patient is a 83 y.o.  male with history of HTN, HLD, DM-2, C Kd stage IIIb, nonobstructive CAD-managed medically, chronic microcytic anemia-likely due to intermittent GI bleeding in the setting of colonic/gastric AVMs, vascular dementia-who was recently hospitalized from 4/4-4/7 for PE/DVT-started on anticoagulation-presented with black/maroon-colored stools-found to have acute blood loss anemia, AKI on CT kidney stage IIIb along with hyperkalemia.   Significant events: 4/10>> admit to [facility]-GI bleed-AKI/hyperkalemia.   Significant studies: 4/04>> CTA chest: B/L lower lobe segmental PE. 4/06>> B/L lower Doppler: Acute DVT left popliteal vein 4/10>> CXR: No PNA 4/11>> echo: EF 70-75% 4/11>> L LE Doppler: DVT in popliteal vein   Significant microbiology data: None   Procedures: 4/11>> IVC filter placement by Dr. Angela Kell 4/11>> EGD: No bleeding etiology found   Consults: GI Vascular surgery  Brief Hospital Course: GI bleed with acute blood loss anemia and hypovolemic shock in the setting of recent initiation of anticoagulation for DVT/PE-known history of gastric/colonic AVMs No further bleeding-Hb stable after 4 units of PRBC-last transfused on 4/11 EGD unremarkable-however he has known history of  colonic/gastric AVMs.  Discussed with GI MD-Dr. Roise Cleaver plans for colonoscopy-stable for discharge from their point of view. No plans to resume anticoagulation on discharge now that he has an IVC filter Advance diet today-will watch for a few hours-if stable-okay to discharge home later this afternoon.     AKI on CKD stage IIIb Hemodynamically mediated due to GI bleed/hypotension Creatinine has improved with supportive care and now close to baseline.   Hyperkalemia Due to worsening AKI Has been treated with insulin/D50/Lokelma Potassium has now normalized-creatinine back to baseline.   Lactic acidosis Due to hypoperfusion from GI bleeding-sepsis ruled out.   HTN BP stable-in fact creeping up Resumed home dose metoprolol.   History of nonobstructive CAD No anginal symptoms   Recent PE and left popliteal DVT Unable to anticoagulate given severity of bleeding-known history of AVMs S/p IVC filter placement this admission-suspect not a good candidate for long-term anticoagulation.   Asthma without exacerbation Pulmonary hypertension Chronic hypoxic respiratory failure on 3 L of oxygen at baseline Continue bronchodilators Stable on 3 L of oxygen   DM-2 (A1c 6.2 on 2/13) CBGs stable on SSI  HLD Statin    Dementia Mood disorder Delirium precautions Continue Namenda/olanzapine   BMI: Estimated body mass index is 25.89 kg/m as calculated from the following:   Height as of this encounter: 5\' 11"  (1.803 m).   Weight as of this encounter: 84.2 kg.   Note-updated spouse on day of discharge-she is very anxious about discharge plans.  I have reassured her  that he is as good as he could be.  His risk of rebleeding is the same here in the hospital as at home.  Discharge Diagnoses:  Principal Problem:   GI bleed Active Problems:   Acute pulmonary embolism (HCC)   DVT (deep venous thrombosis) (HCC)   Hyperkalemia   Asthma, chronic   Essential hypertension   Pulmonary  hypertension (HCC)   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)   Hypotension   Vascular dementia (HCC)   Non-insulin dependent type 2 diabetes mellitus (HCC)   Discharge Instructions:  Activity:  As tolerated with Full fall precautions use walker/cane & assistance as needed   Discharge Instructions     Call MD for:   Complete by: As directed    Hematochezia/melena   Call MD for:  extreme fatigue   Complete by: As directed    Call MD for:  persistant dizziness or light-headedness   Complete by: As directed    Diet - low sodium heart healthy   Complete by: As directed    Diet Carb Modified   Complete by: As directed    Increase activity slowly   Complete by: As directed       Allergies as of 02/24/2024   No Known Allergies      Medication List     STOP taking these medications    amoxicillin-clavulanate 875-125 MG tablet Commonly known as: AUGMENTIN   apixaban 5 MG Tabs tablet Commonly known as: ELIQUIS   aspirin EC 81 MG tablet   Eliquis DVT/PE Starter Pack Generic drug: Apixaban Starter Pack (10mg  and 5mg )       TAKE these medications    albuterol 108 (90 Base) MCG/ACT inhaler Commonly known as: VENTOLIN HFA Inhale 2 puffs into the lungs every 6 (six) hours as needed for wheezing or shortness of breath.   ALPRAZolam 0.5 MG tablet Commonly known as: XANAX Take 0.5 mg by mouth 2 (two) times daily as needed for anxiety.   atorvastatin 20 MG tablet Commonly known as: LIPITOR Take 1 tablet (20 mg total) by mouth daily.   Chlorphen-PE-Acetaminophen 4-10-325 MG Tabs Take 1 tablet by mouth daily as needed (For sinus).   Cholecalciferol 25 MCG (1000 UT) tablet Take 1,000 Units by mouth daily.   Dulera 200-5 MCG/ACT Aero Generic drug: mometasone-formoterol Inhale 2 puffs into the lungs 2 (two) times daily.   escitalopram 5 MG tablet Commonly known as: LEXAPRO Take 1 tablet (5 mg total) by mouth daily.    Folivane-Plus Caps Take 1 capsule by mouth daily.   furosemide 40 MG tablet Commonly known as: LASIX Take 0.5 tablets (20 mg total) by mouth daily.   ipratropium-albuterol 0.5-2.5 (3) MG/3ML Soln Commonly known as: DUONEB Inhale 3 mLs into the lungs every 2 (two) hours as needed (SOB/Wheezing).   memantine 5 MG tablet Commonly known as: NAMENDA Take 5 mg by mouth 2 (two) times daily.   metoprolol tartrate 50 MG tablet Commonly known as: LOPRESSOR Take 1 tablet (50 mg total) by mouth 2 (two) times daily.   OLANZapine 2.5 MG tablet Commonly known as: ZyPREXA Take 1 tablet (2.5 mg total) by mouth 2 (two) times daily as needed (agitation).   pantoprazole 40 MG tablet Commonly known as: Protonix Take 1 tablet (40 mg total) by mouth 2 (two) times daily.   VITAMIN B12 PO Take 1 tablet by mouth daily.        Follow-up Information     Yolanda Hence, MD.  Schedule an appointment as soon as possible for a visit in 1 week(s).   Specialty: Internal Medicine Contact information: [address] [place] [address] [phone]         Kathi Der, MD. Schedule an appointment as soon as possible for a visit in 2 week(s).   Specialty: Gastroenterology Contact information: [address] [place] [address] [phone]                No Known Allergies   Other Procedures/Studies: VAS [place] LOWER EXTREMITY VENOUS (DVT) Result Date: 02/22/2024  Lower Venous DVT Study Patient Name:  Jerry [name].  Date of Exam:   02/22/2024 Medical Rec #: [id number]          Accession #:    [id number] Date of Birth: 02-10-1941           Patient Gender: M Patient Age:   83 years Exam Location:  [facility] Procedure:      VAS [place] LOWER EXTREMITY VENOUS (DVT) Referring Phys: Tereasa Coop --------------------------------------------------------------------------------  Indications: History left popliteal acute DVT on 02/17/24 - Follow up study.   Risk Factors: DVT 02/17/24. Performing Technologist: Scraper Sink Sturdivant-Jones RDMS, RVT  Examination Guidelines: A complete evaluation includes B-mode imaging, spectral Doppler, color Doppler, and power Doppler as needed of all accessible portions of each vessel. Bilateral testing is considered an integral part of a complete examination. Limited examinations for reoccurring indications may be performed as noted. The reflux portion of the exam is performed with the patient in reverse Trendelenburg.  +---------+---------------+---------+-----------+----------+--------------+ LEFT     CompressibilityPhasicitySpontaneityPropertiesThrombus Aging +---------+---------------+---------+-----------+----------+--------------+ CFV      Full           Yes      Yes                                 +---------+---------------+---------+-----------+----------+--------------+ SFJ      Full                                                        +---------+---------------+---------+-----------+----------+--------------+ FV Prox  Full                                                        +---------+---------------+---------+-----------+----------+--------------+ FV Mid   Full                                                        +---------+---------------+---------+-----------+----------+--------------+ FV DistalFull                                                        +---------+---------------+---------+-----------+----------+--------------+ PFV      Full                                                        +---------+---------------+---------+-----------+----------+--------------+  POP      Partial        Yes      Yes                  Acute          +---------+---------------+---------+-----------+----------+--------------+ PTV      Full                                                        +---------+---------------+---------+-----------+----------+--------------+ PERO      Full                                                        +---------+---------------+---------+-----------+----------+--------------+     Summary: LEFT: - Findings consistent with acute deep vein thrombosis involving the left popliteal vein.  - Findings appear essentially unchanged compared to previous examination.  *See table(s) above for measurements and observations. Electronically signed by Irvin Mantel on 02/22/2024 at 9:00:00 PM.    Final    ECHOCARDIOGRAM COMPLETE Result Date: 02/22/2024    ECHOCARDIOGRAM LIMITED REPORT   Patient Name:   Jerry [name]. Date of Exam: 02/22/2024 Medical Rec #:  [id number]         Height:       71.0 in Accession #:    [id number]        Weight:       169.1 lb Date of Birth:  01/02/1941          BSA:          1.963 m Patient Age:    82 years          BP:           102/54 mmHg Patient Gender: M                 HR:           97 bpm. Exam Location:  Inpatient Procedure: 2D Echo, Cardiac Doppler and Color Doppler (Both Spectral and Color            Flow Doppler were utilized during procedure). Indications:    Pulmonary Embolus I26.09  History:        Patient has prior history of Echocardiogram examinations, most                 recent 01/07/2024.  Sonographer:    Hersey Lorenzo RDCS Referring Phys: [id number] SUBRINA SUNDIL IMPRESSIONS  1. Left ventricular ejection fraction, by estimation, is 70 to 75%. The left ventricle has hyperdynamic function. The left ventricle has no regional wall motion abnormalities. There is mild concentric left ventricular hypertrophy. There is the interventricular septum is flattened in systole, consistent with right ventricular pressure overload.  2. Right ventricular systolic function is low normal. The right ventricular size is normal.  3. Right atrial size was mildly dilated.  4. The mitral valve is normal in structure. No evidence of mitral valve regurgitation. No evidence of mitral stenosis.  5. The aortic valve is tricuspid. There is  moderate calcification of the aortic valve. Aortic valve regurgitation is not visualized. Aortic valve sclerosis/calcification is present, without any evidence of aortic stenosis.  6. The inferior vena cava is normal in size with greater than 50% respiratory variability, suggesting right atrial pressure of 3 mmHg. Conclusion(s)/Recommendation(s): Full study unable to be loaded currently. FINDINGS  Left Ventricle: Left ventricular ejection fraction, by estimation, is 70 to 75%. The left ventricle has hyperdynamic function. The left ventricle has no regional wall motion abnormalities. The left ventricular internal cavity size was normal in size. There is mild concentric left ventricular hypertrophy. The interventricular septum is flattened in systole, consistent with right ventricular pressure overload. Right Ventricle: The right ventricular size is normal. No increase in right ventricular wall thickness. Right ventricular systolic function is low normal. Left Atrium: Left atrial size was normal in size. Right Atrium: Right atrial size was mildly dilated. Pericardium: Trivial pericardial effusion is present. The pericardial effusion is posterior to the left ventricle. Mitral Valve: The mitral valve is normal in structure. No evidence of mitral valve stenosis. Tricuspid Valve: The tricuspid valve is normal in structure. Tricuspid valve regurgitation is trivial. No evidence of tricuspid stenosis. Aortic Valve: The aortic valve is tricuspid. There is moderate calcification of the aortic valve. Aortic valve regurgitation is not visualized. Aortic valve sclerosis/calcification is present, without any evidence of aortic stenosis. Pulmonic Valve: The pulmonic valve was normal in structure. Pulmonic valve regurgitation is trivial. No evidence of pulmonic stenosis. Aorta: The aortic root is normal in size and structure. Venous: The inferior vena cava is normal in size with greater than 50% respiratory variability, suggesting  right atrial pressure of 3 mmHg. IAS/Shunts: No atrial level shunt detected by color flow Doppler. LEFT VENTRICLE PLAX 2D LVIDd:         4.70 cm   Diastology LVIDs:         2.40 cm   LV e' lateral:   6.85 cm/s LV PW:         0.90 cm   LV E/e' lateral: 12.0 LV IVS:        0.80 cm LVOT diam:     2.00 cm LV SV:         74 LV SV Index:   37 LVOT Area:     3.14 cm  RIGHT VENTRICLE RV S prime:     15.20 cm/s TAPSE (M-mode): 2.1 cm LEFT ATRIUM             Index        RIGHT ATRIUM           Index LA diam:        2.60 cm 1.32 cm/m   RA Area:     23.30 cm LA Vol (A2C):   49.2 ml 25.06 ml/m  RA Volume:   66.60 ml  33.92 ml/m LA Vol (A4C):   60.6 ml 30.86 ml/m LA Biplane Vol: 56.1 ml 28.57 ml/m  AORTIC VALVE LVOT Vmax:   120.00 cm/s LVOT Vmean:  84.400 cm/s LVOT VTI:    0.234 m  AORTA Ao Root diam: 3.00 cm Ao Asc diam:  3.00 cm MITRAL VALVE MV Area (PHT): 5.50 cm     SHUNTS MV Decel Time: 138 msec     Systemic VTI:  0.23 m MV E velocity: 82.30 cm/s   Systemic Diam: 2.00 cm MV A velocity: 111.00 cm/s MV E/A ratio:  0.74 Jerry [name] Electronically signed by Jerry [name] Signature Date/Time: 02/22/2024/10:48:35 AM    Final    HYBRID OR IMAGING ([facility] ONLY) Result Date: 02/22/2024 There is no interpretation for this exam.  This order is for images  obtained during a surgical procedure.  Please See "Surgeries" Tab for more information regarding the procedure.   DG Chest Port 1 View Result Date: 02/21/2024 CLINICAL DATA:  GI bleed EXAM: PORTABLE CHEST 1 VIEW COMPARISON:  02/15/2024 FINDINGS: Probable small left effusion with mild atelectasis or infiltrate persisting at the left base. Stable cardiomediastinal silhouette. No pneumothorax IMPRESSION: Probable small left effusion with mild atelectasis or infiltrate at the left base. Electronically Signed   By: Jasmine Pang M.D.   On: 02/21/2024 20:33   VAS [place] LOWER EXTREMITY VENOUS (DVT) Result Date: 02/17/2024  Lower Venous DVT Study Patient Name:  Jerry [name].   Date of Exam:   02/17/2024 Medical Rec #: [id number]          Accession #:    [id number] Date of Birth: [birth year]           Patient Gender: M Patient Age:   61 years Exam Location:  [facility] Procedure:      VAS [place] LOWER EXTREMITY VENOUS (DVT) Referring Phys: Osvaldo Shipper --------------------------------------------------------------------------------  Indications: SOB, and pulmonary embolism.  Comparison Study: Previous exam on 01/27/2024 ([facility]) was negative for DVT Performing Technologist: Ernestene Mention RVT, RDMS  Examination Guidelines: A complete evaluation includes B-mode imaging, spectral Doppler, color Doppler, and power Doppler as needed of all accessible portions of each vessel. Bilateral testing is considered an integral part of a complete examination. Limited examinations for reoccurring indications may be performed as noted. The reflux portion of the exam is performed with the patient in reverse Trendelenburg.  +---------+---------------+---------+-----------+----------+--------------+ RIGHT    CompressibilityPhasicitySpontaneityPropertiesThrombus Aging +---------+---------------+---------+-----------+----------+--------------+ CFV      Full           Yes      Yes                                 +---------+---------------+---------+-----------+----------+--------------+ SFJ      Full                                                        +---------+---------------+---------+-----------+----------+--------------+ FV Prox  Full           Yes      Yes                                 +---------+---------------+---------+-----------+----------+--------------+ FV Mid   Full           Yes      Yes                                 +---------+---------------+---------+-----------+----------+--------------+ FV DistalFull           Yes      Yes                                 +---------+---------------+---------+-----------+----------+--------------+ PFV      Full                                                         +---------+---------------+---------+-----------+----------+--------------+  POP      Full           Yes      Yes                                 +---------+---------------+---------+-----------+----------+--------------+ PTV      Full                                                        +---------+---------------+---------+-----------+----------+--------------+ PERO     Full                                                        +---------+---------------+---------+-----------+----------+--------------+   +---------+---------------+---------+-----------+----------+--------------+ LEFT     CompressibilityPhasicitySpontaneityPropertiesThrombus Aging +---------+---------------+---------+-----------+----------+--------------+ CFV      Full           Yes      Yes                                 +---------+---------------+---------+-----------+----------+--------------+ SFJ      Full                                                        +---------+---------------+---------+-----------+----------+--------------+ FV Prox  Full           Yes      Yes                                 +---------+---------------+---------+-----------+----------+--------------+ FV Mid   Full           Yes      Yes                                 +---------+---------------+---------+-----------+----------+--------------+ FV DistalFull           Yes      Yes                                 +---------+---------------+---------+-----------+----------+--------------+ PFV      Full                                                        +---------+---------------+---------+-----------+----------+--------------+ POP      Partial        Yes      Yes                  Acute          +---------+---------------+---------+-----------+----------+--------------+ PTV      Full                                                         +---------+---------------+---------+-----------+----------+--------------+  PERO     Full                                                        +---------+---------------+---------+-----------+----------+--------------+     Summary: BILATERAL: -No evidence of popliteal cyst, bilaterally. RIGHT: - There is no evidence of deep vein thrombosis in the lower extremity.  LEFT: - Findings consistent with acute deep vein thrombosis involving the left popliteal vein.   *See table(s) above for measurements and observations. Electronically signed by Genny Kid MD on 02/17/2024 at 1:28:34 PM.    Final    CT Angio Chest PE W/Cm &/Or Wo Cm Result Date: 02/15/2024 CLINICAL DATA:  Shortness of breath EXAM: CT ANGIOGRAPHY CHEST WITH CONTRAST TECHNIQUE: Multidetector CT imaging of the chest was performed using the standard protocol during bolus administration of intravenous contrast. Multiplanar CT image reconstructions and MIPs were obtained to evaluate the vascular anatomy. RADIATION DOSE REDUCTION: This exam was performed according to the departmental dose-optimization program which includes automated exposure control, adjustment of the mA and/or kV according to patient size and/or use of iterative reconstruction technique. CONTRAST:  60mL OMNIPAQUE IOHEXOL 350 MG/ML SOLN COMPARISON:  X-ray earlier 02/15/2024. CT without contrast 01/27/2024 FINDINGS: Cardiovascular: There is enlarged heart. Coronary calcifications are seen. Small pericardial effusion. Thoracic aorta is normal course and caliber with partially calcified atherosclerotic plaque. There is a bovine type aortic arch, normal variant. Filling defects seen along branches of the pulmonary artery in the right lower lobe, segmental. Some areas also suggested in the left lower lobe consistent with pulmonary emboli. Mild overall clot burden. No reflux of contrast into the intrahepatic IVC. Mediastinum/Nodes: Preserved thyroid gland. The thoracic esophagus is  normal course and caliber. No specific abnormal lymph node enlargement seen in the axillary region. Some small less than 1 cm size mediastinal nodes are seen, not pathologic by size criteria. Slightly larger right hilar node identified measuring 18 by 13 mm on series 7, image 202. of note there is also shift of the mediastinum from right to left with left lung volume loss. Lungs/Pleura: Centrilobular emphysematous changes identified. Once again there is also lower lobe areas of bronchiectasis with mucous plugging. Parenchymal opacities identified as well along both lower lobes with small amount of along the dependent lingula. These areas are similar to previous. Opacity in the more mid upper left upper lobe have shown improvement. Opacities in the right upper lobe are also overall less in amount but there are some new areas more posteriorly in the right upper lobe on series 8, image 69. 5 mm nodule seen along the course of the minor fissure, stable. Diffuse breathing motion. No pneumothorax. Upper Abdomen: Complex upper pole left-sided renal cyst identified as on prior. Please correlate prior abdomen pelvis CT. Preserved adrenal glands. Prior cholecystectomy Musculoskeletal: Curvature and degenerative changes seen along the spine. Review of the MIP images confirms the above findings. Critical Value/emergent results were called by telephone at the time of interpretation on 02/15/2024 at 10:55 am to provider Dr. Val Garin, who verbally acknowledged these results. IMPRESSION: Bilateral lower lobe segmental pulmonary emboli.  Mild clot burden. Slightly improving opacities in both upper lobes with a subtle new area of mild ground-glass opacity right upper lobe. Areas of opacity in both both lower lobes are similar to the prior CT scan. Volume loss and shift  of the mediastinum from right to left. Complex left-sided renal cystic foci. Please correlate with prior abdomen pelvis CT Aortic Atherosclerosis (ICD10-I70.0) and  Emphysema (ICD10-J43.9). Electronically Signed   By: Adrianna Horde M.D.   On: 02/15/2024 10:56   DG Chest 2 View Result Date: 02/15/2024 CLINICAL DATA:  Shortness of breath. EXAM: CHEST - 2 VIEW COMPARISON:  02/03/2024 FINDINGS: Subtle retrocardiac opacity is similar to prior compatible with atelectasis or infiltrate. Tiny left pleural effusion. No dense consolidation in the right lung. No pneumothorax. Cardiopericardial silhouette is at upper limits of normal for size. No acute bony abnormality. Degenerative changes are noted in the right shoulder. IMPRESSION: 1. No substantial change in exam. 2. Subtle retrocardiac opacity compatible with atelectasis or infiltrate. 3. Tiny left pleural effusion. Electronically Signed   By: Donnal Fusi M.D.   On: 02/15/2024 05:17   DG Chest Port 1 View Result Date: 02/03/2024 CLINICAL DATA:  Pneumonia. EXAM: PORTABLE CHEST 1 VIEW COMPARISON:  01/31/2024 and CT chest 01/27/2024. FINDINGS: Patient is slightly rotated. Trachea is midline. Heart is enlarged, stable. Mild bibasilar streaky opacities, left greater than right. Small left pleural effusion. IMPRESSION: 1. Patchy bibasilar atelectasis, left greater than right. Difficult to exclude residual pneumonia when compared with 01/27/2024. 2. Small left pleural effusion. Electronically Signed   By: Shearon Denis M.D.   On: 02/03/2024 12:09   DG Chest Port 1 View Result Date: 01/31/2024 CLINICAL DATA:  Shortness of breath. EXAM: PORTABLE CHEST 1 VIEW COMPARISON:  January 27, 2024. FINDINGS: Stable cardiomediastinal silhouette. Right lung is clear. Mild left basilar atelectasis or infiltrate is noted with possible small pleural effusion. Bony thorax is unremarkable. IMPRESSION: Mild left basilar atelectasis or infiltrate is noted with small left pleural effusion. Electronically Signed   By: Rosalene Colon M.D.   On: 01/31/2024 15:00   CARDIAC CATHETERIZATION Result Date: 01/29/2024   Prox RCA lesion is 25% stenosed.   LV  end diastolic pressure is mildly elevated.   Hemodynamic findings consistent with moderate pulmonary hypertension. Minimal nonobstructive CAD Mildly elevated LV filling pressures. LVEDP 19 mm Hg. PCWP 24/27 with mean 19 mm Hg Moderate pulmonary HTN. PAP 65/30 with mean 40 mm Hg Cardiac output 5.49 L/min, index 2.83 Plan: medical management. Type 2 NSTEMI   PERIPHERAL VASCULAR CATHETERIZATION Result Date: 01/29/2024   Prox RCA lesion is 25% stenosed.   LV end diastolic pressure is mildly elevated.   Hemodynamic findings consistent with moderate pulmonary hypertension. Minimal nonobstructive CAD Mildly elevated LV filling pressures. LVEDP 19 mm Hg. PCWP 24/27 with mean 19 mm Hg Moderate pulmonary HTN. PAP 65/30 with mean 40 mm Hg Cardiac output 5.49 L/min, index 2.83 Plan: medical management. Type 2 NSTEMI   VAS US  LOWER EXTREMITY VENOUS (DVT) (7a-7p) Result Date: 01/28/2024  Lower Venous DVT Study Patient Name:  Jerry [name].  Date of Exam:   01/27/2024 Medical Rec #: [id number]          Accession #:    [id number] Date of Birth: Aug 16, 1941           Patient Gender: M Patient Age:   66 years Exam Location:  [facility] Procedure:      VAS US  LOWER EXTREMITY VENOUS (DVT) Referring Phys: Almond Army --------------------------------------------------------------------------------  Indications: Edema, Syncope, and SOB.  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 Comparison Study: No prior study on file Performing Technologist: Carleene Chase RVS  Examination Guidelines: A complete evaluation  includes B-mode imaging, spectral Doppler, color Doppler, and power Doppler as needed of all accessible portions of each vessel. Bilateral testing is considered an integral part of a complete examination. Limited examinations for reoccurring indications may be performed as noted. The reflux portion of the exam is performed with the  patient in reverse Trendelenburg.  +---------+---------------+---------+-----------+---------------+-------------+ RIGHT    CompressibilityPhasicitySpontaneityProperties     Thrombus                                                                 Aging         +---------+---------------+---------+-----------+---------------+-------------+ CFV      Full           Yes      Yes        Pulsatile                                                                waveform                     +---------+---------------+---------+-----------+---------------+-------------+ SFJ      Full                                                            +---------+---------------+---------+-----------+---------------+-------------+ FV Prox  Full                                                            +---------+---------------+---------+-----------+---------------+-------------+ FV Mid   Full                                                            +---------+---------------+---------+-----------+---------------+-------------+ FV DistalFull                                                            +---------+---------------+---------+-----------+---------------+-------------+ PFV      Full                                                            +---------+---------------+---------+-----------+---------------+-------------+ POP      Full  Yes      No         Pulsatile                                                                waveform                     +---------+---------------+---------+-----------+---------------+-------------+ PTV      Full                                                            +---------+---------------+---------+-----------+---------------+-------------+ PERO     Full                                                             +---------+---------------+---------+-----------+---------------+-------------+ Gastroc  Full                                                            +---------+---------------+---------+-----------+---------------+-------------+   +----+---------------+---------+-----------+------------------+--------------+ LEFTCompressibilityPhasicitySpontaneityProperties        Thrombus Aging +----+---------------+---------+-----------+------------------+--------------+ CFV Full           Yes      No         Pulsatile waveform               +----+---------------+---------+-----------+------------------+--------------+     Summary: RIGHT: - There is no evidence of deep vein thrombosis in the lower extremity.  - No cystic structure found in the popliteal fossa. Pulsatile waveforms throughout. Interstitial edema noted throughout right lower extremity.  LEFT: - No evidence of common femoral vein obstruction.  Pulsatile waveform.  *See table(s) above for measurements and observations. Electronically signed by Irvin Mantel on 01/28/2024 at 5:41:41 PM.    Final    CT CHEST ABDOMEN PELVIS WO CONTRAST Result Date: 01/27/2024 CLINICAL DATA:  Sepsis EXAM: CT CHEST, ABDOMEN AND PELVIS WITHOUT CONTRAST TECHNIQUE: Multidetector CT imaging of the chest, abdomen and pelvis was performed following the standard protocol without IV contrast. RADIATION DOSE REDUCTION: This exam was performed according to the departmental dose-optimization program which includes automated exposure control, adjustment of the mA and/or kV according to patient size and/or use of iterative reconstruction technique. COMPARISON:  Chest x-ray today.  Chest CT 04/17/2015 FINDINGS: CT CHEST FINDINGS Cardiovascular: Heart is borderline in size. Scattered coronary artery and aortic calcifications. No evidence of aortic aneurysm. Mediastinum/Nodes: No mediastinal, hilar, or axillary adenopathy. Trachea and esophagus are unremarkable. Thyroid  unremarkable. Lungs/Pleura: Mild centrilobular emphysema. Airspace opacities noted in the left upper lobe and left lower lobe most compatible with pneumonia. Nodular area in the left upper lobe measuring up to 1.6 cm likely also related to the infectious process, but follow-up recommended after treatment to ensure resolution. Mild  peribronchial thickening. No confluent opacity on the right. No effusions. Musculoskeletal: Chest wall soft tissues are unremarkable. No acute bony abnormality. CT ABDOMEN PELVIS FINDINGS Hepatobiliary: Low-density lesion in the inferior right hepatic lobe measures up to 4.5 cm and is stable since prior study most compatible with cysts. Prior cholecystectomy. No suspicious focal hepatic abnormality or biliary ductal dilatation. Pancreas: No focal abnormality or ductal dilatation. Spleen: No focal abnormality.  Normal size. Adrenals/Urinary Tract: Adrenal glands normal. 5.5 cm lesion in the upper pole of the left kidney is isodense to the kidney. This is slightly larger than prior study, but most likely reflects a hemorrhagic or proteinaceous cyst. Numerous bilateral low-density lesions in the kidneys are most compatible with cysts. No follow-up imaging recommended. No hydronephrosis. Right lower quadrant urinary diversion. Prior cystectomy. Stomach/Bowel: Normal appendix. Stomach, large and small bowel grossly unremarkable. Small supraumbilical ventral hernia contains the anterior wall of the transverse colon. No bowel obstruction. Vascular/Lymphatic: Aortoiliac atherosclerosis. No evidence of aneurysm or adenopathy. Reproductive: No visible focal abnormality. Other: No free fluid or free air. Musculoskeletal: Postoperative and degenerative changes in the lumbar spine. No acute bony abnormality. IMPRESSION: Patchy airspace opacity in the left upper lobe and left lower lobe most compatible with pneumonia. Nodular component in the left upper lobe most likely also related to pneumonia, but  recommend follow-up CT in 3-6 months after treatment to ensure resolution. Mild emphysema. Coronary artery disease, aortic atherosclerosis. No acute findings in the abdomen or pelvis. Electronically Signed   By: Janeece Mechanic M.D.   On: 01/27/2024 20:14   DG Chest Port 1 View Result Date: 01/27/2024 CLINICAL DATA:  Near syncope, short of breath EXAM: PORTABLE CHEST 1 VIEW COMPARISON:  06/26/2023 FINDINGS: Single frontal view of the chest demonstrates an enlarged cardiac silhouette. There is patchy left basilar consolidation, increased since prior exam. No effusion or pneumothorax. No acute bony abnormalities. IMPRESSION: 1. Patchy left basilar consolidation, slightly more pronounced than prior study. While this could reflect recurrent aspiration or persistent infection, lack of documented interval clearance may warrant further evaluation with CT. 2. Enlarged cardiac silhouette. Electronically Signed   By: Bobbye Burrow M.D.   On: 01/27/2024 16:27     TODAY-DAY OF DISCHARGE:  Subjective:   Jerry [name] today has no headache,no chest abdominal pain,no new weakness tingling or numbness, feels much better wants to go home today.   Objective:   Blood pressure (!) 160/79, pulse 86, temperature 99.1 F (37.3 C), temperature source Oral, resp. rate 20, height 5\' 11"  (1.803 m), weight 84.2 kg, SpO2 99%.  Intake/Output Summary (Last 24 hours) at 02/24/2024 0922 Last data filed at 02/24/2024 0410 Gross per 24 hour  Intake 1780 ml  Output 2450 ml  Net -670 ml   Filed Weights   02/22/24 1335 02/23/24 0405  Weight: 76.7 kg 84.2 kg    Exam: Awake Alert, Oriented *3, No new F.N deficits, Normal affect [place].AT,PERRAL Supple Neck,No JVD, No cervical lymphadenopathy appriciated.  Symmetrical Chest wall movement, Good air movement bilaterally, CTAB RRR,No Gallops,Rubs or new Murmurs, No Parasternal Heave +ve B.Sounds, Abd Soft, Non tender, No organomegaly appriciated, No rebound -guarding or rigidity. No  Cyanosis, Clubbing or edema, No new Rash or bruise   PERTINENT RADIOLOGIC STUDIES: VAS US  LOWER EXTREMITY VENOUS (DVT) Result Date: 02/22/2024  Lower Venous DVT Study Patient Name:  Jerry [name].  Date of Exam:   02/22/2024 Medical Rec #: [id number]          Accession #:  [id number] Date of Birth: Aug 11, 1941           Patient Gender: M Patient Age:   32 years Exam [facility]  [facility] Procedure:      VAS [place] LOWER EXTREMITY VENOUS (DVT) Referring Phys: Tereasa Coop --------------------------------------------------------------------------------  Indications: History left popliteal acute DVT on 02/17/24 - Follow up study.  Risk Factors: DVT 02/17/24. Performing Technologist: Rollingstone Sink Sturdivant-Jones RDMS, RVT  Examination Guidelines: A complete evaluation includes B-mode imaging, spectral Doppler, color Doppler, and power Doppler as needed of all accessible portions of each vessel. Bilateral testing is considered an integral part of a complete examination. Limited examinations for reoccurring indications may be performed as noted. The reflux portion of the exam is performed with the patient in reverse Trendelenburg.  +---------+---------------+---------+-----------+----------+--------------+ LEFT     CompressibilityPhasicitySpontaneityPropertiesThrombus Aging +---------+---------------+---------+-----------+----------+--------------+ CFV      Full           Yes      Yes                                 +---------+---------------+---------+-----------+----------+--------------+ SFJ      Full                                                        +---------+---------------+---------+-----------+----------+--------------+ FV Prox  Full                                                        +---------+---------------+---------+-----------+----------+--------------+ FV Mid   Full                                                         +---------+---------------+---------+-----------+----------+--------------+ FV DistalFull                                                        +---------+---------------+---------+-----------+----------+--------------+ PFV      Full                                                        +---------+---------------+---------+-----------+----------+--------------+ POP      Partial        Yes      Yes                  Acute          +---------+---------------+---------+-----------+----------+--------------+ PTV      Full                                                        +---------+---------------+---------+-----------+----------+--------------+  PERO     Full                                                        +---------+---------------+---------+-----------+----------+--------------+     Summary: LEFT: - Findings consistent with acute deep vein thrombosis involving the left popliteal vein.  - Findings appear essentially unchanged compared to previous examination.  *See table(s) above for measurements and observations. Electronically signed by Irvin Mantel on 02/22/2024 at 9:00:00 PM.    Final    ECHOCARDIOGRAM COMPLETE Result Date: 02/22/2024    ECHOCARDIOGRAM LIMITED REPORT   Patient Name:   Jerry [name]. Date of Exam: 02/22/2024 Medical Rec #:  [id number]         Height:       71.0 in Accession #:    [id number]        Weight:       169.1 lb Date of Birth:  [birth year]          BSA:          1.963 m Patient Age:    82 years          BP:           102/54 mmHg Patient Gender: M                 HR:           97 bpm. Exam Location:  Inpatient Procedure: 2D Echo, Cardiac Doppler and Color Doppler (Both Spectral and Color            Flow Doppler were utilized during procedure). Indications:    Pulmonary Embolus I26.09  History:        Patient has prior history of Echocardiogram examinations, most                 recent 01/07/2024.  Sonographer:    Hersey Lorenzo RDCS Referring  Phys: [phone] SUBRINA SUNDIL IMPRESSIONS  1. Left ventricular ejection fraction, by estimation, is 70 to 75%. The left ventricle has hyperdynamic function. The left ventricle has no regional wall motion abnormalities. There is mild concentric left ventricular hypertrophy. There is the interventricular septum is flattened in systole, consistent with right ventricular pressure overload.  2. Right ventricular systolic function is low normal. The right ventricular size is normal.  3. Right atrial size was mildly dilated.  4. The mitral valve is normal in structure. No evidence of mitral valve regurgitation. No evidence of mitral stenosis.  5. The aortic valve is tricuspid. There is moderate calcification of the aortic valve. Aortic valve regurgitation is not visualized. Aortic valve sclerosis/calcification is present, without any evidence of aortic stenosis.  6. The inferior vena cava is normal in size with greater than 50% respiratory variability, suggesting right atrial pressure of 3 mmHg. Conclusion(s)/Recommendation(s): Full study unable to be loaded currently. FINDINGS  Left Ventricle: Left ventricular ejection fraction, by estimation, is 70 to 75%. The left ventricle has hyperdynamic function. The left ventricle has no regional wall motion abnormalities. The left ventricular internal cavity size was normal in size. There is mild concentric left ventricular hypertrophy. The interventricular septum is flattened in systole, consistent with right ventricular pressure overload. Right Ventricle: The right ventricular size is normal. No increase in right ventricular wall thickness. Right ventricular systolic function is low normal. Left Atrium:  Left atrial size was normal in size. Right Atrium: Right atrial size was mildly dilated. Pericardium: Trivial pericardial effusion is present. The pericardial effusion is posterior to the left ventricle. Mitral Valve: The mitral valve is normal in structure. No evidence of mitral  valve stenosis. Tricuspid Valve: The tricuspid valve is normal in structure. Tricuspid valve regurgitation is trivial. No evidence of tricuspid stenosis. Aortic Valve: The aortic valve is tricuspid. There is moderate calcification of the aortic valve. Aortic valve regurgitation is not visualized. Aortic valve sclerosis/calcification is present, without any evidence of aortic stenosis. Pulmonic Valve: The pulmonic valve was normal in structure. Pulmonic valve regurgitation is trivial. No evidence of pulmonic stenosis. Aorta: The aortic root is normal in size and structure. Venous: The inferior vena cava is normal in size with greater than 50% respiratory variability, suggesting right atrial pressure of 3 mmHg. IAS/Shunts: No atrial level shunt detected by color flow Doppler. LEFT VENTRICLE PLAX 2D LVIDd:         4.70 cm   Diastology LVIDs:         2.40 cm   LV e' lateral:   6.85 cm/s LV PW:         0.90 cm   LV E/e' lateral: 12.0 LV IVS:        0.80 cm LVOT diam:     2.00 cm LV SV:         74 LV SV Index:   37 LVOT Area:     3.14 cm  RIGHT VENTRICLE RV S prime:     15.20 cm/s TAPSE (M-mode): 2.1 cm LEFT ATRIUM             Index        RIGHT ATRIUM           Index LA diam:        2.60 cm 1.32 cm/m   RA Area:     23.30 cm LA Vol (A2C):   49.2 ml 25.06 ml/m  RA Volume:   66.60 ml  33.92 ml/m LA Vol (A4C):   60.6 ml 30.86 ml/m LA Biplane Vol: 56.1 ml 28.57 ml/m  AORTIC VALVE LVOT Vmax:   120.00 cm/s LVOT Vmean:  84.400 cm/s LVOT VTI:    0.234 m  AORTA Ao Root diam: 3.00 cm Ao Asc diam:  3.00 cm MITRAL VALVE MV Area (PHT): 5.50 cm     SHUNTS MV Decel Time: 138 msec     Systemic VTI:  0.23 m MV E velocity: 82.30 cm/s   Systemic Diam: 2.00 cm MV A velocity: 111.00 cm/s MV E/A ratio:  0.74 Jerry [name] Electronically signed by Jerry [name] Signature Date/Time: 02/22/2024/10:48:35 AM    Final    HYBRID OR IMAGING ([facility] ONLY) Result Date: 02/22/2024 There is no interpretation for this exam.  This order is for  images obtained during a surgical procedure.  Please See "Surgeries" Tab for more information regarding the procedure.     PERTINENT LAB RESULTS: CBC: Recent Labs    02/23/24 1627 02/24/24 0537  WBC 8.9 7.1  HGB 8.9* 8.4*  HCT 29.5* 27.6*  PLT 146* 118*   CMET CMP     Component Value Date/Time   NA 142 02/24/2024 0537   NA 142 12/27/2023 1106   K 4.1 02/24/2024 0537   CL 109 02/24/2024 0537   CO2 25 02/24/2024 0537   GLUCOSE 98 02/24/2024 0537   BUN 27 (H) 02/24/2024 0537   BUN 31 (H) 12/27/2023 1106  CREATININE 2.01 (H) 02/24/2024 0537   CALCIUM 8.7 (L) 02/24/2024 0537   PROT 5.3 (L) 02/23/2024 1247   PROT 6.6 12/27/2023 1106   ALBUMIN 2.8 (L) 02/23/2024 1247   ALBUMIN 3.9 12/27/2023 1106   AST 31 02/23/2024 1247   ALT 21 02/23/2024 1247   ALKPHOS 46 02/23/2024 1247   BILITOT 0.8 02/23/2024 1247   BILITOT 0.4 12/27/2023 1106   EGFR 34 (L) 12/27/2023 1106   GFRNONAA 33 (L) 02/24/2024 0537    GFR Estimated Creatinine Clearance: 31.2 mL/min (A) (by C-G formula based on SCr of 2.01 mg/dL (H)). No results for input(s): "LIPASE", "AMYLASE" in the last 72 hours. No results for input(s): "CKTOTAL", "CKMB", "CKMBINDEX", "TROPONINI" in the last 72 hours. Invalid input(s): "POCBNP" No results for input(s): "DDIMER" in the last 72 hours. No results for input(s): "HGBA1C" in the last 72 hours. No results for input(s): "CHOL", "HDL", "LDLCALC", "TRIG", "CHOLHDL", "LDLDIRECT" in the last 72 hours. No results for input(s): "TSH", "T4TOTAL", "T3FREE", "THYROIDAB" in the last 72 hours.  Invalid input(s): "FREET3" No results for input(s): "VITAMINB12", "FOLATE", "FERRITIN", "TIBC", "IRON", "RETICCTPCT" in the last 72 hours. Coags: Recent Labs    02/21/24 1755  INR 1.6*   Microbiology: Recent Results (from the past 240 hours)  Resp panel by RT-PCR (RSV, Flu A&B, Covid) Anterior Nasal Swab     Status: None   Collection Time: 02/15/24  4:43 AM   Specimen: Anterior Nasal  Swab  Result Value Ref Range Status   SARS Coronavirus 2 by RT PCR NEGATIVE NEGATIVE Final   Influenza A by PCR NEGATIVE NEGATIVE Final   Influenza B by PCR NEGATIVE NEGATIVE Final    Comment: (NOTE) The Xpert Xpress SARS-CoV-2/FLU/RSV plus assay is intended as an aid in the diagnosis of influenza from Nasopharyngeal swab specimens and should not be used as a sole basis for treatment. Nasal washings and aspirates are unacceptable for Xpert Xpress SARS-CoV-2/FLU/RSV testing.  Fact Sheet for Patients: BloggerCourse.com  Fact Sheet for Healthcare Providers: SeriousBroker.it  This test is not yet approved or cleared by the [place]  FDA and has been authorized for detection and/or diagnosis of SARS-CoV-2 by FDA under an Emergency Use Authorization (EUA). This EUA will remain in effect (meaning this test can be used) for the duration of the COVID-19 declaration under Section 564(b)(1) of the Act, 21 U.S.C. section 360bbb-3(b)(1), unless the authorization is terminated or revoked.     Resp Syncytial Virus by PCR NEGATIVE NEGATIVE Final    Comment: (NOTE) Fact Sheet for Patients: BloggerCourse.com  Fact Sheet for Healthcare Providers: SeriousBroker.it  This test is not yet approved or cleared by the [place]  FDA and has been authorized for detection and/or diagnosis of SARS-CoV-2 by FDA under an Emergency Use Authorization (EUA). This EUA will remain in effect (meaning this test can be used) for the duration of the COVID-19 declaration under Section 564(b)(1) of the Act, 21 U.S.C. section 360bbb-3(b)(1), unless the authorization is terminated or revoked.  Performed at [facility], [address], [place], [place] [address]     FURTHER DISCHARGE INSTRUCTIONS:  Get Medicines reviewed and adjusted: Please take all your medications with you for your next visit with  your Primary MD  Laboratory/radiological data: Please request your Primary MD to go over all hospital tests and procedure/radiological results at the follow up, please ask your Primary MD to get all Hospital records sent to his/her office.  In some cases, they will be blood work, cultures and biopsy  results pending at the time of your discharge. Please request that your primary care M.D. goes through all the records of your hospital data and follows up on these results.  Also Note the following: If you experience worsening of your admission symptoms, develop shortness of breath, life threatening emergency, suicidal or homicidal thoughts you must seek medical attention immediately by calling 911 or calling your MD immediately  if symptoms less severe.  You must read complete instructions/literature along with all the possible adverse reactions/side effects for all the Medicines you take and that have been prescribed to you. Take any new Medicines after you have completely understood and accpet all the possible adverse reactions/side effects.   Do not drive when taking Pain medications or sleeping medications (Benzodaizepines)  Do not take more than prescribed Pain, Sleep and Anxiety Medications. It is not advisable to combine anxiety,sleep and pain medications without talking with your primary care practitioner  Special Instructions: If you have smoked or chewed Tobacco  in the last 2 yrs please stop smoking, stop any regular Alcohol  and or any Recreational drug use.  Wear Seat belts while driving.  Please note: You were cared for by a hospitalist during your hospital stay. Once you are discharged, your primary care physician will handle any further medical issues. Please note that NO REFILLS for any discharge medications will be authorized once you are discharged, as it is imperative that you return to your primary care physician (or establish a relationship with a primary care physician if you  do not have one) for your post hospital discharge needs so that they can reassess your need for medications and monitor your lab values.  Total Time spent coordinating discharge including counseling, education and face to face time equals greater than 30 minutes.  Signed: Garl Speigner 02/24/2024 9:22 AM

## 2024-02-24 NOTE — TOC Transition Note (Signed)
 Transition of Care [facility]) - Discharge Note   Patient Details  Name: Jerry [name]. MRN: [id number] Date of Birth: [birth year]  Transition of Care [facility]) CM/SW Contact:  Omie Bickers, RN Phone Number: 02/24/2024, 9:35 AM   Clinical Narrative:     Patient currently active w [facility]. Will DC home w [facility] services. No other needs identified.    Final next level of care: Home w Home Health Services Barriers to Discharge: No Barriers Identified   Patient Goals and CMS Choice Patient states their goals for this hospitalization and ongoing recovery are:: return home w [facility] CMS Medicare.gov Compare Post Acute Care list provided to:: Patient Choice offered to / list presented to : Patient      Discharge Placement                       Discharge Plan and Services Additional resources added to the After Visit Summary for                  DME Arranged: N/A         HH Arranged: OT, PT HH Agency: [facility] Date [facility] Agency Contacted: 02/24/24 Time HH Agency Contacted: [phone] Representative spoke with at [facility] Agency: Randel Buss  Social Drivers of Health (SDOH) Interventions SDOH Screenings   Food Insecurity: No Food Insecurity (02/15/2024)  Housing: Low Risk  (02/15/2024)  Transportation Needs: No Transportation Needs (02/15/2024)  Utilities: Not At Risk (02/15/2024)  Social Connections: Socially Integrated (02/15/2024)  Tobacco Use: Low Risk  (02/22/2024)     Readmission Risk Interventions    02/18/2024    2:20 PM 06/27/2023   12:12 PM  Readmission Risk Prevention Plan  Transportation Screening Complete Complete  PCP or Specialist Appt within 5-7 Days  Complete  PCP or Specialist Appt within 3-5 Days Complete   Home Care Screening  Complete  Medication Review (RN CM)  Complete  HRI or Home Care Consult Complete   Palliative Care Screening Not Applicable   Medication Review (RN Care Manager) Complete

## 2024-02-24 NOTE — Progress Notes (Signed)
 DISCHARGE NOTE HOME Jerry [name]. to be discharged Home per MD order. Discussed prescriptions and follow up appointments with the patient. Prescriptions given to patient; medication list explained in detail. Patient verbalized understanding.  Skin clean, dry and intact without evidence of skin break down, no evidence of skin tears noted. IV catheter discontinued intact. Site without signs and symptoms of complications. Dressing and pressure applied. Pt denies pain at the site currently. No complaints   See LDA going home with urostomy and incision.  An After Visit Summary (AVS) was printed and given to the patient. Patient will be escorted by the floor via wheelchair, and discharged home via private auto when ride arrives  Tonda Francisco, [employer]

## 2024-02-24 NOTE — Evaluation (Signed)
 Occupational Therapy Evaluation Patient Details Name: Jerry [name]. MRN: [id number] DOB: 18-May-1941 Today's Date: 02/24/2024   History of Present Illness   83 y.o. male presents to [facility] on 02/21/24 with rectal bleeding. Patient with recent PE and put on blood thinners. 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 Upon eval, pt reports he intermittently wears supplementary O2 at home. Pt SpO2 >90 throughout session on 3 L. Pt HR up to 122 with BADL. Pt grossly needing up to CGA for safety during ADL, but min A for LB Adl intermittently. Up to min cues for safety with mobility and use of RW. Recommending discharge home with HHOT to optimize safety and independence with ADL.      If plan is discharge home, recommend the following:   A little help with walking and/or transfers;A lot of help with bathing/dressing/bathroom;Assistance with cooking/housework;Direct supervision/assist for medications management;Direct supervision/assist for financial management;Assist for transportation;Help with stairs or ramp for entrance     Functional Status Assessment   Patient has had a recent decline in their functional status and demonstrates the ability to make significant improvements in function in a reasonable and predictable amount of time.     Equipment Recommendations   None recommended by OT     Recommendations for Other Services         Precautions/Restrictions   Precautions Precautions: Fall Precaution/Restrictions Comments: watch O2 Restrictions Weight Bearing Restrictions Per Provider Order: No     Mobility Bed Mobility Overal bed mobility: Needs Assistance Bed Mobility: Supine to Sit, Sit to Supine     Supine to  sit: HOB elevated, Supervision Sit to supine: Supervision, HOB elevated   General bed mobility comments: supervision for safety, increased time, no physical assist    Transfers Overall transfer level: Needs assistance Equipment used: Rolling walker (2 wheels) Transfers: Sit to/from Stand, Bed to chair/wheelchair/BSC Sit to Stand: Contact guard assist                  Balance Overall balance assessment: Needs assistance Sitting-balance support: No upper extremity supported, Feet supported Sitting balance-Leahy Scale: Good     Standing balance support: Bilateral upper extremity supported, During functional activity, Reliant on assistive device for balance Standing balance-Leahy Scale: Poor Standing balance comment: reliant on RW                           ADL either performed or assessed with clinical judgement   ADL Overall ADL's : Needs assistance/impaired Eating/Feeding: Independent;Sitting   Grooming: Wash/dry face;Oral care;Wash/dry hands;Contact guard assist;Standing   Upper Body Bathing: Set up;Sitting   Lower Body Bathing: Minimal assistance;Sit to/from stand   Upper Body Dressing : Set up;Sitting   Lower Body Dressing: Minimal assistance;Sit to/from stand   Toilet Transfer: Contact guard assist;Ambulation;Rolling walker (2 wheels);Regular Toilet;Grab bars;Cueing for safety   Toileting- Clothing Manipulation and Hygiene: Contact guard assist;Sit to/from stand       Functional mobility during ADLs: Contact guard assist;Rolling walker (2 wheels)       Vision Baseline Vision/History: 1 Wears glasses Ability to See in Adequate Light: 0 Adequate Patient Visual Report: No change from baseline Vision Assessment?: No apparent visual deficits     Perception  Praxis         Pertinent Vitals/Pain Pain Assessment Pain Assessment: No/denies pain     Extremity/Trunk Assessment Upper Extremity Assessment Upper Extremity Assessment:  Generalized weakness   Lower Extremity Assessment Lower Extremity Assessment: Defer to PT evaluation   Cervical / Trunk Assessment Cervical / Trunk Assessment: Kyphotic   Communication Communication Communication: No apparent difficulties   Cognition Arousal: Alert Behavior During Therapy: WFL for tasks assessed/performed Cognition: No family/caregiver present to determine baseline             OT - Cognition Comments: pt with history of vascular dementia, needs encouragement to participate at times. Oriented to day, month, year, situation. poor initiation of management of his own suprapubic cath but reports he only wears bag at night and different device during day without as long of line.                 Following commands: Intact Following commands impaired: Follows one step commands with increased time     Cueing  General Comments   Cueing Techniques: Verbal cues;Tactile cues  HR 100 at rest and up to 122 with short distance mobility, toileing/standing grooming   Exercises     Shoulder Instructions      Home Living Family/patient expects to be discharged to:: Private residence Living Arrangements: Spouse/significant other Available Help at Discharge: Available 24 hours/day Type of Home: House Home Access: Stairs to enter [employer] of Steps: 2 Entrance Stairs-Rails: Right Home Layout: One level     Bathroom Shower/Tub: Producer, television/film/video: Handicapped height     Home Equipment: Grab bars - toilet;Shower seat;Grab bars - Chartered loss adjuster (2 wheels);Cane - single point          Prior Functioning/Environment Prior Level of Function : Independent/Modified Independent;Driving             Mobility Comments: walks with a RW vs cane ADLs Comments: Ind with ADLs/selcare    OT Problem List: Decreased strength;Impaired balance (sitting and/or standing);Decreased activity tolerance;Decreased cognition;Decreased  safety awareness;Decreased knowledge of use of DME or AE   OT Treatment/Interventions: Self-care/ADL training;DME and/or AE instruction;Therapeutic activities;Patient/family education;Balance training      OT Goals(Current goals can be found in the care plan section)   Acute Rehab OT Goals Patient Stated Goal: go home OT Goal Formulation: With patient Time For Goal Achievement: 03/09/24 Potential to Achieve Goals: Good   OT Frequency:  Min 2X/week    Co-evaluation              AM-PAC OT "6 Clicks" Daily Activity     Outcome Measure Help from another person eating meals?: None Help from another person taking care of personal grooming?: A Little Help from another person toileting, which includes using toliet, bedpan, or urinal?: A Little Help from another person bathing (including washing, rinsing, drying)?: A Little Help from another person to put on and taking off regular upper body clothing?: A Little Help from another person to put on and taking off regular lower body clothing?: A Little 6 Click Score: 19   End of Session Equipment Utilized During Treatment: Rolling walker (2 wheels);Gait belt Nurse Communication: Mobility status  Activity Tolerance: Patient tolerated treatment well Patient left: with call bell/phone within reach;in bed;with bed alarm set  OT Visit Diagnosis: Unsteadiness on feet (R26.81);Other abnormalities of gait and mobility (R26.89);Muscle weakness (generalized) (M62.81);Other symptoms and signs involving cognitive function;Other (comment)  Time: 1610-9604 OT Time Calculation (min): 27 min Charges:  OT General Charges $OT Visit: 1 Visit OT Evaluation $OT Eval Low Complexity: 1 Low OT Treatments $Self Care/Home Management : 8-22 mins  Karilyn Ouch, OTR/L [facility] Acute Rehabilitation Office: [phone]   Jerry [name] 02/24/2024, 10:54 AM

## 2024-02-25 ENCOUNTER — Encounter (HOSPITAL_COMMUNITY): Payer: Self-pay | Admitting: Vascular Surgery

## 2024-02-27 NOTE — Telephone Encounter (Signed)
 Wife is following up regarding order for oxygen. She wanted to make sure it was sent because she hasn't received any updates. Please advise.

## 2024-02-27 NOTE — Telephone Encounter (Signed)
 Spoke with wife per DPR and informed her that information was faxed per note two weeks ago that she should reach out to [employer]. She states that was her next call. She just wanted to make sure we sent it.

## 2024-03-01 ENCOUNTER — Emergency Department (HOSPITAL_COMMUNITY)

## 2024-03-01 ENCOUNTER — Other Ambulatory Visit: Payer: Self-pay

## 2024-03-01 ENCOUNTER — Inpatient Hospital Stay (HOSPITAL_COMMUNITY)
Admission: EM | Admit: 2024-03-01 | Discharge: 2024-03-08 | DRG: 314 | Disposition: A | Discharge status: Home Health | Attending: Internal Medicine | Admitting: Internal Medicine

## 2024-03-01 DIAGNOSIS — J9601 Acute respiratory failure with hypoxia: Principal | ICD-10-CM

## 2024-03-01 DIAGNOSIS — Z1152 Encounter for screening for COVID-19: Secondary | ICD-10-CM

## 2024-03-01 DIAGNOSIS — I21A1 Myocardial infarction type 2: Secondary | ICD-10-CM

## 2024-03-01 DIAGNOSIS — E875 Hyperkalemia: Secondary | ICD-10-CM | POA: Diagnosis not present

## 2024-03-01 DIAGNOSIS — Z8546 Personal history of malignant neoplasm of prostate: Secondary | ICD-10-CM

## 2024-03-01 DIAGNOSIS — Z8551 Personal history of malignant neoplasm of bladder: Secondary | ICD-10-CM

## 2024-03-01 DIAGNOSIS — Z794 Long term (current) use of insulin: Secondary | ICD-10-CM

## 2024-03-01 DIAGNOSIS — G4700 Insomnia, unspecified: Secondary | ICD-10-CM | POA: Diagnosis present

## 2024-03-01 DIAGNOSIS — J9621 Acute and chronic respiratory failure with hypoxia: Principal | ICD-10-CM | POA: Diagnosis present

## 2024-03-01 DIAGNOSIS — Z86718 Personal history of other venous thrombosis and embolism: Secondary | ICD-10-CM

## 2024-03-01 DIAGNOSIS — I272 Pulmonary hypertension, unspecified: Secondary | ICD-10-CM | POA: Diagnosis not present

## 2024-03-01 DIAGNOSIS — I2510 Atherosclerotic heart disease of native coronary artery without angina pectoris: Secondary | ICD-10-CM | POA: Diagnosis present

## 2024-03-01 DIAGNOSIS — N1832 Chronic kidney disease, stage 3b: Secondary | ICD-10-CM | POA: Diagnosis present

## 2024-03-01 DIAGNOSIS — G4733 Obstructive sleep apnea (adult) (pediatric): Secondary | ICD-10-CM | POA: Diagnosis present

## 2024-03-01 DIAGNOSIS — I2489 Other forms of acute ischemic heart disease: Secondary | ICD-10-CM | POA: Diagnosis present

## 2024-03-01 DIAGNOSIS — J90 Pleural effusion, not elsewhere classified: Secondary | ICD-10-CM | POA: Diagnosis present

## 2024-03-01 DIAGNOSIS — J441 Chronic obstructive pulmonary disease with (acute) exacerbation: Secondary | ICD-10-CM | POA: Diagnosis present

## 2024-03-01 DIAGNOSIS — Z79899 Other long term (current) drug therapy: Secondary | ICD-10-CM

## 2024-03-01 DIAGNOSIS — N179 Acute kidney failure, unspecified: Secondary | ICD-10-CM | POA: Diagnosis present

## 2024-03-01 DIAGNOSIS — E1122 Type 2 diabetes mellitus with diabetic chronic kidney disease: Secondary | ICD-10-CM | POA: Diagnosis present

## 2024-03-01 DIAGNOSIS — F0390 Unspecified dementia without behavioral disturbance: Secondary | ICD-10-CM | POA: Diagnosis present

## 2024-03-01 DIAGNOSIS — I289 Disease of pulmonary vessels, unspecified: Secondary | ICD-10-CM

## 2024-03-01 DIAGNOSIS — Z833 Family history of diabetes mellitus: Secondary | ICD-10-CM

## 2024-03-01 DIAGNOSIS — Z7951 Long term (current) use of inhaled steroids: Secondary | ICD-10-CM

## 2024-03-01 DIAGNOSIS — I50813 Acute on chronic right heart failure: Secondary | ICD-10-CM | POA: Diagnosis present

## 2024-03-01 DIAGNOSIS — J9811 Atelectasis: Secondary | ICD-10-CM | POA: Diagnosis present

## 2024-03-01 DIAGNOSIS — I130 Hypertensive heart and chronic kidney disease with heart failure and stage 1 through stage 4 chronic kidney disease, or unspecified chronic kidney disease: Secondary | ICD-10-CM | POA: Diagnosis present

## 2024-03-01 DIAGNOSIS — I2694 Multiple subsegmental pulmonary emboli without acute cor pulmonale: Secondary | ICD-10-CM | POA: Diagnosis present

## 2024-03-01 DIAGNOSIS — J45901 Unspecified asthma with (acute) exacerbation: Secondary | ICD-10-CM | POA: Diagnosis present

## 2024-03-01 DIAGNOSIS — F0153 Vascular dementia, unspecified severity, with mood disturbance: Secondary | ICD-10-CM | POA: Diagnosis present

## 2024-03-01 DIAGNOSIS — E782 Mixed hyperlipidemia: Secondary | ICD-10-CM | POA: Diagnosis present

## 2024-03-01 NOTE — ED Provider Notes (Signed)
 [place] EMERGENCY DEPARTMENT AT [facility] Provider Note   CSN: [id number] Arrival date & time: 03/01/24  2229     History {Add pertinent medical, surgical, social history, OB history to HPI:1} Chief Complaint  Patient presents with   Shortness of Breath    Jerry [name]. is a 83 y.o. male.  The history is provided by the patient and medical records.  Shortness of Breath  83 y.o. M with hx of CKD, asthma, HTN, OSA, pulmonary HTN, DM2, vascular dementia, recent admission for GI bleed requiring transfusion and large amounts of IVF here with SOB.  States at baseline on 3L.  Remains on his baseline O2 but feels more labored.  States hard time laying flat.  Reports some cough but denies fever/chills.  Has had some increased LE edema.  Home Medications Prior to Admission medications   Medication Sig Start Date End Date Taking? Authorizing Provider  albuterol  (VENTOLIN  HFA) 108 (90 Base) MCG/ACT inhaler Inhale 2 puffs into the lungs every 6 (six) hours as needed for wheezing or shortness of breath. 07/12/21   Rosa College D, MD  ALPRAZolam  (XANAX ) 0.5 MG tablet Take 0.5 mg by mouth 2 (two) times daily as needed for anxiety.    [provider]  atorvastatin  (LIPITOR) 20 MG tablet Take 1 tablet (20 mg total) by mouth daily. 02/04/24 03/05/24  Lesa Rape, MD  Chlorphen-PE-Acetaminophen  4-10-325 MG TABS Take 1 tablet by mouth daily as needed (For sinus). 02/20/24   [provider]  Cholecalciferol  1000 UNITS tablet Take 1,000 Units by mouth daily.    [provider]  Cyanocobalamin  (VITAMIN B12 PO) Take 1 tablet by mouth daily.    [provider]  DULERA  200-5 MCG/ACT AERO Inhale 2 puffs into the lungs 2 (two) times daily.    [provider]  escitalopram  (LEXAPRO ) 5 MG tablet Take 1 tablet (5 mg total) by mouth daily. 06/06/23 01/26/25  Onuoha, Josephine C, NP  FeFum-FePoly-FA-B Cmp-C-Biot Nicoletta Barrier) CAPS Take 1 capsule by mouth  daily.    [provider]  furosemide  (LASIX ) 40 MG tablet Take 0.5 tablets (20 mg total) by mouth daily. 02/18/24   Mdala-Gausi, Jilda Most, MD  ipratropium-albuterol  (DUONEB) 0.5-2.5 (3) MG/3ML SOLN Inhale 3 mLs into the lungs every 2 (two) hours as needed (SOB/Wheezing).    [provider]  memantine  (NAMENDA ) 5 MG tablet Take 5 mg by mouth 2 (two) times daily. 02/17/24   [provider]  metoprolol  tartrate (LOPRESSOR ) 50 MG tablet Take 1 tablet (50 mg total) by mouth 2 (two) times daily. 02/03/24 03/04/24  Lesa Rape, MD  OLANZapine  (ZYPREXA ) 2.5 MG tablet Take 1 tablet (2.5 mg total) by mouth 2 (two) times daily as needed (agitation). 06/05/23   Trish Furl, MD  pantoprazole  (PROTONIX ) 40 MG tablet Take 1 tablet (40 mg total) by mouth 2 (two) times daily. 02/18/24   Mdala-Gausi, Jilda Most, MD      Allergies    Patient has no known allergies.    Review of Systems   Review of Systems  Respiratory:  Positive for shortness of breath.   Cardiovascular:  Positive for leg swelling.  All other systems reviewed and are negative.   Physical Exam Updated Vital Signs BP 113/70   Pulse 92   Temp 98.8 F (37.1 C) (Oral)   Resp (!) 37   Ht 5\' 11"  (1.803 m)   Wt 85 kg   SpO2 97%   BMI 26.14 kg/m   Physical  Exam Vitals and nursing note reviewed.  Constitutional:      Appearance: He is well-developed.  HENT:     Head: Normocephalic and atraumatic.  Eyes:     Conjunctiva/sclera: Conjunctivae normal.     Pupils: Pupils are equal, round, and reactive to light.  Neck:     Comments: + JVD Cardiovascular:     Rate and Rhythm: Normal rate and regular rhythm.     Heart sounds: Normal heart sounds.  Pulmonary:     Effort: Pulmonary effort is normal.     Breath sounds: Rales present.     Comments: Audibly wet from bedside, currently on 3L (baseline) and maintaining into mid 90's Abdominal:     General: Bowel sounds are normal.     Palpations: Abdomen is soft.   Musculoskeletal:        General: Normal range of motion.     Cervical back: Normal range of motion.     Comments: 2+ pitting edema BLE  Skin:    General: Skin is warm and dry.  Neurological:     Mental Status: He is alert and oriented to person, place, and time.     ED Results / Procedures / Treatments   Labs (all labs ordered are listed, but only abnormal results are displayed) Labs Reviewed  RESP PANEL BY RT-PCR (RSV, FLU A&B, COVID)  [id number]  CBC WITH DIFFERENTIAL/PLATELET  BASIC METABOLIC PANEL WITH GFR  BRAIN NATRIURETIC PEPTIDE  TROPONIN I (HIGH SENSITIVITY)    EKG None  Radiology No results found.  Procedures Procedures  {Document cardiac monitor, telemetry assessment procedure when appropriate:1}  Medications Ordered in ED Medications - No data to display  ED Course/ Medical Decision Making/ A&P   {   Click here for ABCD2, HEART and other calculatorsREFRESH Note before signing :1}                              Medical Decision Making Amount and/or Complexity of Data Reviewed Labs: ordered. Radiology: ordered.   ***  {Document critical care time when appropriate:1} {Document review of labs and clinical decision tools ie heart score, Chads2Vasc2 etc:1}  {Document your independent review of radiology images, and any outside records:1} {Document your discussion with family members, caretakers, and with consultants:1} {Document social determinants of health affecting pt's care:1} {Document your decision making why or why not admission, treatments were needed:1} Final Clinical Impression(s) / ED Diagnoses Final diagnoses:  None    Rx / DC Orders ED Discharge Orders     None

## 2024-03-01 NOTE — ED Triage Notes (Signed)
 Pt BIB [facility] from home. Pt reports being SOB all day; did 3 home neb treatments that did not help. EMS gave 2 Duo Nebs. Pt denies pain at this time.

## 2024-03-02 ENCOUNTER — Inpatient Hospital Stay (HOSPITAL_COMMUNITY)

## 2024-03-02 DIAGNOSIS — F0153 Vascular dementia, unspecified severity, with mood disturbance: Secondary | ICD-10-CM | POA: Diagnosis present

## 2024-03-02 DIAGNOSIS — N179 Acute kidney failure, unspecified: Secondary | ICD-10-CM | POA: Diagnosis present

## 2024-03-02 DIAGNOSIS — I2694 Multiple subsegmental pulmonary emboli without acute cor pulmonale: Secondary | ICD-10-CM | POA: Diagnosis present

## 2024-03-02 DIAGNOSIS — I8290 Acute embolism and thrombosis of unspecified vein: Secondary | ICD-10-CM | POA: Diagnosis not present

## 2024-03-02 DIAGNOSIS — E1122 Type 2 diabetes mellitus with diabetic chronic kidney disease: Secondary | ICD-10-CM | POA: Diagnosis present

## 2024-03-02 DIAGNOSIS — M7989 Other specified soft tissue disorders: Secondary | ICD-10-CM | POA: Diagnosis not present

## 2024-03-02 DIAGNOSIS — Z1152 Encounter for screening for COVID-19: Secondary | ICD-10-CM | POA: Diagnosis not present

## 2024-03-02 DIAGNOSIS — I2489 Other forms of acute ischemic heart disease: Secondary | ICD-10-CM | POA: Diagnosis present

## 2024-03-02 DIAGNOSIS — J9811 Atelectasis: Secondary | ICD-10-CM | POA: Diagnosis present

## 2024-03-02 DIAGNOSIS — I130 Hypertensive heart and chronic kidney disease with heart failure and stage 1 through stage 4 chronic kidney disease, or unspecified chronic kidney disease: Secondary | ICD-10-CM | POA: Diagnosis present

## 2024-03-02 DIAGNOSIS — G4733 Obstructive sleep apnea (adult) (pediatric): Secondary | ICD-10-CM | POA: Diagnosis present

## 2024-03-02 DIAGNOSIS — J441 Chronic obstructive pulmonary disease with (acute) exacerbation: Secondary | ICD-10-CM | POA: Diagnosis present

## 2024-03-02 DIAGNOSIS — Z794 Long term (current) use of insulin: Secondary | ICD-10-CM | POA: Diagnosis not present

## 2024-03-02 DIAGNOSIS — Z86718 Personal history of other venous thrombosis and embolism: Secondary | ICD-10-CM | POA: Diagnosis not present

## 2024-03-02 DIAGNOSIS — I21A1 Myocardial infarction type 2: Secondary | ICD-10-CM | POA: Diagnosis not present

## 2024-03-02 DIAGNOSIS — E875 Hyperkalemia: Secondary | ICD-10-CM | POA: Diagnosis not present

## 2024-03-02 DIAGNOSIS — J9601 Acute respiratory failure with hypoxia: Secondary | ICD-10-CM | POA: Diagnosis present

## 2024-03-02 DIAGNOSIS — N1832 Chronic kidney disease, stage 3b: Secondary | ICD-10-CM | POA: Diagnosis present

## 2024-03-02 DIAGNOSIS — J9621 Acute and chronic respiratory failure with hypoxia: Secondary | ICD-10-CM | POA: Diagnosis present

## 2024-03-02 DIAGNOSIS — R0609 Other forms of dyspnea: Secondary | ICD-10-CM | POA: Diagnosis not present

## 2024-03-02 DIAGNOSIS — Z79899 Other long term (current) drug therapy: Secondary | ICD-10-CM | POA: Diagnosis not present

## 2024-03-02 DIAGNOSIS — I272 Pulmonary hypertension, unspecified: Secondary | ICD-10-CM | POA: Diagnosis present

## 2024-03-02 DIAGNOSIS — I50810 Right heart failure, unspecified: Secondary | ICD-10-CM | POA: Diagnosis not present

## 2024-03-02 DIAGNOSIS — I2609 Other pulmonary embolism with acute cor pulmonale: Secondary | ICD-10-CM | POA: Diagnosis not present

## 2024-03-02 DIAGNOSIS — Z8551 Personal history of malignant neoplasm of bladder: Secondary | ICD-10-CM | POA: Diagnosis not present

## 2024-03-02 DIAGNOSIS — I50813 Acute on chronic right heart failure: Secondary | ICD-10-CM | POA: Diagnosis present

## 2024-03-02 DIAGNOSIS — I2510 Atherosclerotic heart disease of native coronary artery without angina pectoris: Secondary | ICD-10-CM | POA: Diagnosis present

## 2024-03-02 DIAGNOSIS — F0390 Unspecified dementia without behavioral disturbance: Secondary | ICD-10-CM | POA: Diagnosis present

## 2024-03-02 DIAGNOSIS — J90 Pleural effusion, not elsewhere classified: Secondary | ICD-10-CM | POA: Diagnosis present

## 2024-03-02 DIAGNOSIS — E782 Mixed hyperlipidemia: Secondary | ICD-10-CM | POA: Diagnosis present

## 2024-03-02 DIAGNOSIS — J45901 Unspecified asthma with (acute) exacerbation: Secondary | ICD-10-CM | POA: Diagnosis present

## 2024-03-02 LAB — CBC WITH DIFFERENTIAL/PLATELET
Abs Immature Granulocytes: 0.24 10*3/uL — ABNORMAL HIGH (ref 0.00–0.07)
Abs Immature Granulocytes: 0.26 10*3/uL — ABNORMAL HIGH (ref 0.00–0.07)
Basophils Absolute: 0.1 10*3/uL (ref 0.0–0.1)
Basophils Absolute: 0.1 10*3/uL (ref 0.0–0.1)
Basophils Relative: 1 %
Basophils Relative: 1 %
Eosinophils Absolute: 0.6 10*3/uL — ABNORMAL HIGH (ref 0.0–0.5)
Eosinophils Absolute: 0.6 10*3/uL — ABNORMAL HIGH (ref 0.0–0.5)
Eosinophils Relative: 5 %
Eosinophils Relative: 5 %
HCT: 31.3 % — ABNORMAL LOW (ref 39.0–52.0)
HCT: 34 % — ABNORMAL LOW (ref 39.0–52.0)
Hemoglobin: 8.8 g/dL — ABNORMAL LOW (ref 13.0–17.0)
Hemoglobin: 9.3 g/dL — ABNORMAL LOW (ref 13.0–17.0)
Immature Granulocytes: 2 %
Immature Granulocytes: 2 %
Lymphocytes Relative: 19 %
Lymphocytes Relative: 20 %
Lymphs Abs: 2.1 10*3/uL (ref 0.7–4.0)
Lymphs Abs: 2.2 10*3/uL (ref 0.7–4.0)
MCH: 24.9 pg — ABNORMAL LOW (ref 26.0–34.0)
MCH: 25.6 pg — ABNORMAL LOW (ref 26.0–34.0)
MCHC: 27.4 g/dL — ABNORMAL LOW (ref 30.0–36.0)
MCHC: 28.1 g/dL — ABNORMAL LOW (ref 30.0–36.0)
MCV: 90.9 fL (ref 80.0–100.0)
MCV: 91 fL (ref 80.0–100.0)
Monocytes Absolute: 1 10*3/uL (ref 0.1–1.0)
Monocytes Absolute: 1.1 10*3/uL — ABNORMAL HIGH (ref 0.1–1.0)
Monocytes Relative: 10 %
Monocytes Relative: 9 %
Neutro Abs: 7 10*3/uL (ref 1.7–7.7)
Neutro Abs: 7.1 10*3/uL (ref 1.7–7.7)
Neutrophils Relative %: 63 %
Neutrophils Relative %: 63 %
Platelets: 226 10*3/uL (ref 150–400)
Platelets: 239 10*3/uL (ref 150–400)
RBC: 3.44 MIL/uL — ABNORMAL LOW (ref 4.22–5.81)
RBC: 3.74 MIL/uL — ABNORMAL LOW (ref 4.22–5.81)
RDW: 18.8 % — ABNORMAL HIGH (ref 11.5–15.5)
RDW: 18.9 % — ABNORMAL HIGH (ref 11.5–15.5)
WBC: 11.1 10*3/uL — ABNORMAL HIGH (ref 4.0–10.5)
WBC: 11.2 10*3/uL — ABNORMAL HIGH (ref 4.0–10.5)
nRBC: 0 % (ref 0.0–0.2)
nRBC: 0 % (ref 0.0–0.2)

## 2024-03-02 LAB — TROPONIN I (HIGH SENSITIVITY)
Troponin I (High Sensitivity): 23 ng/L — ABNORMAL HIGH (ref <18)
Troponin I (High Sensitivity): 42 ng/L — ABNORMAL HIGH (ref <18)
Troponin I (High Sensitivity): 55 ng/L — ABNORMAL HIGH (ref <18)
Troponin I (High Sensitivity): 42 ng/L — ABNORMAL HIGH (ref <18)

## 2024-03-02 LAB — GLUCOSE, CAPILLARY
Glucose-Capillary: 189 mg/dL — ABNORMAL HIGH (ref 70–99)
Glucose-Capillary: 237 mg/dL — ABNORMAL HIGH (ref 70–99)

## 2024-03-02 LAB — PROCALCITONIN
Procalcitonin: <0.1 ng/mL
Procalcitonin: <0.1 ng/mL

## 2024-03-02 LAB — COMPREHENSIVE METABOLIC PANEL WITH GFR
ALT: 18 U/L (ref 0–44)
AST: 22 U/L (ref 15–41)
Albumin: 3.1 g/dL — ABNORMAL LOW (ref 3.5–5.0)
Alkaline Phosphatase: 61 U/L (ref 38–126)
Anion gap: 9 (ref 5–15)
BUN: 22 mg/dL (ref 8–23)
CO2: 24 mmol/L (ref 22–32)
Calcium: 8.9 mg/dL (ref 8.9–10.3)
Chloride: 108 mmol/L (ref 98–111)
Creatinine, Ser: 2.64 mg/dL — ABNORMAL HIGH (ref 0.61–1.24)
GFR, Estimated: 24 mL/min — ABNORMAL LOW (ref >60)
Glucose, Bld: 139 mg/dL — ABNORMAL HIGH (ref 70–99)
Potassium: 4.7 mmol/L (ref 3.5–5.1)
Sodium: 141 mmol/L (ref 135–145)
Total Bilirubin: 0.5 mg/dL (ref 0.0–1.2)
Total Protein: 6.1 g/dL — ABNORMAL LOW (ref 6.5–8.1)

## 2024-03-02 LAB — BASIC METABOLIC PANEL WITH GFR
Anion gap: 8 (ref 5–15)
BUN: 22 mg/dL (ref 8–23)
CO2: 23 mmol/L (ref 22–32)
Calcium: 8.7 mg/dL — ABNORMAL LOW (ref 8.9–10.3)
Chloride: 108 mmol/L (ref 98–111)
Creatinine, Ser: 2.5 mg/dL — ABNORMAL HIGH (ref 0.61–1.24)
GFR, Estimated: 25 mL/min — ABNORMAL LOW (ref >60)
Glucose, Bld: 129 mg/dL — ABNORMAL HIGH (ref 70–99)
Potassium: 5 mmol/L (ref 3.5–5.1)
Sodium: 139 mmol/L (ref 135–145)

## 2024-03-02 LAB — CBG MONITORING, ED: Glucose-Capillary: 151 mg/dL — ABNORMAL HIGH (ref 70–99)

## 2024-03-02 LAB — BRAIN NATRIURETIC PEPTIDE: B Natriuretic Peptide: 111.1 pg/mL — ABNORMAL HIGH (ref 0.0–100.0)

## 2024-03-02 LAB — C-REACTIVE PROTEIN: CRP: 0.6 mg/dL (ref <1.0)

## 2024-03-02 LAB — RESP PANEL BY RT-PCR (RSV, FLU A&B, COVID)  RVPGX2
Influenza A by PCR: NEGATIVE
Influenza B by PCR: NEGATIVE
Resp Syncytial Virus by PCR: NEGATIVE
SARS Coronavirus 2 by RT PCR: NEGATIVE

## 2024-03-02 LAB — PHOSPHORUS: Phosphorus: 2.9 mg/dL (ref 2.5–4.6)

## 2024-03-02 LAB — MAGNESIUM: Magnesium: 1.6 mg/dL — ABNORMAL LOW (ref 1.7–2.4)

## 2024-03-02 MED ORDER — SODIUM CHLORIDE 0.9 % IV SOLN
2.0000 g | Freq: Once | INTRAVENOUS | Status: AC
  Administered 2024-03-02: 2 g via INTRAVENOUS
  Filled 2024-03-02: qty 12.5

## 2024-03-02 MED ORDER — OLANZAPINE 5 MG PO TABS: 2.5000 mg | ORAL_TABLET | Freq: Two times a day (BID) | ORAL | Status: DC | PRN

## 2024-03-02 MED ORDER — VANCOMYCIN HCL 750 MG/150ML IV SOLN: 750.0000 mg | INTRAVENOUS | Status: DC

## 2024-03-02 MED ORDER — METHYLPREDNISOLONE SODIUM SUCC 40 MG IJ SOLR
40.0000 mg | Freq: Once | INTRAMUSCULAR | Status: AC
Start: 2024-03-02 — End: 2024-03-02
  Administered 2024-03-02: 40 mg via INTRAVENOUS
  Filled 2024-03-02: qty 1

## 2024-03-02 MED ORDER — GUAIFENESIN 100 MG/5ML PO LIQD
5.0000 mL | Freq: Four times a day (QID) | ORAL | Status: DC
  Administered 2024-03-02 – 2024-03-08 (×21): 5 mL via ORAL
  Filled 2024-03-02 (×11): qty 5
  Filled 2024-03-02: qty 10
  Filled 2024-03-02 (×9): qty 5

## 2024-03-02 MED ORDER — SODIUM CHLORIDE 0.9 % IV SOLN: 2.0000 g | Freq: Two times a day (BID) | INTRAVENOUS | Status: DC

## 2024-03-02 MED ORDER — BUDESONIDE 0.25 MG/2ML IN SUSP
0.2500 mg | Freq: Two times a day (BID) | RESPIRATORY_TRACT | Status: DC
  Administered 2024-03-02 – 2024-03-08 (×13): 0.25 mg via RESPIRATORY_TRACT
  Filled 2024-03-02 (×13): qty 2

## 2024-03-02 MED ORDER — ALPRAZOLAM 0.5 MG PO TABS
0.5000 mg | ORAL_TABLET | Freq: Two times a day (BID) | ORAL | Status: DC | PRN
  Administered 2024-03-05 – 2024-03-07 (×4): 0.5 mg via ORAL
  Filled 2024-03-02 (×5): qty 1

## 2024-03-02 MED ORDER — ONDANSETRON HCL 4 MG/2ML IJ SOLN
4.0000 mg | Freq: Four times a day (QID) | INTRAMUSCULAR | Status: DC | PRN
Start: 2024-03-02 — End: 2024-03-08

## 2024-03-02 MED ORDER — FUROSEMIDE 10 MG/ML IJ SOLN
40.0000 mg | INTRAMUSCULAR | Status: AC
  Administered 2024-03-02: 40 mg via INTRAVENOUS
  Filled 2024-03-02: qty 4

## 2024-03-02 MED ORDER — ESCITALOPRAM OXALATE 10 MG PO TABS
10.0000 mg | ORAL_TABLET | Freq: Every day | ORAL | Status: DC
  Administered 2024-03-02 – 2024-03-08 (×7): 10 mg via ORAL
  Filled 2024-03-02 (×7): qty 1

## 2024-03-02 MED ORDER — FUROSEMIDE 10 MG/ML IJ SOLN
40.0000 mg | Freq: Two times a day (BID) | INTRAMUSCULAR | Status: DC
  Administered 2024-03-02: 40 mg via INTRAVENOUS
  Filled 2024-03-02: qty 4

## 2024-03-02 MED ORDER — MEMANTINE HCL 10 MG PO TABS
10.0000 mg | ORAL_TABLET | Freq: Two times a day (BID) | ORAL | Status: DC
Start: 2024-03-02 — End: 2024-03-08
  Administered 2024-03-02 – 2024-03-08 (×13): 10 mg via ORAL
  Filled 2024-03-02 (×14): qty 1

## 2024-03-02 MED ORDER — VANCOMYCIN HCL 2000 MG/400ML IV SOLN
2000.0000 mg | Freq: Once | INTRAVENOUS | Status: AC
  Administered 2024-03-02: 2000 mg via INTRAVENOUS
  Filled 2024-03-02: qty 400

## 2024-03-02 MED ORDER — INSULIN ASPART 100 UNIT/ML IJ SOLN
0.0000 [IU] | Freq: Three times a day (TID) | INTRAMUSCULAR | Status: DC
  Administered 2024-03-02 (×2): 1 [IU] via SUBCUTANEOUS

## 2024-03-02 MED ORDER — ALBUTEROL SULFATE (2.5 MG/3ML) 0.083% IN NEBU: 2.5000 mg | INHALATION_SOLUTION | RESPIRATORY_TRACT | Status: DC | PRN

## 2024-03-02 MED ORDER — ACETAMINOPHEN 325 MG PO TABS: 650.0000 mg | ORAL_TABLET | Freq: Four times a day (QID) | ORAL | Status: DC | PRN

## 2024-03-02 MED ORDER — SODIUM CHLORIDE 0.9% FLUSH
3.0000 mL | Freq: Two times a day (BID) | INTRAVENOUS | Status: DC
  Administered 2024-03-02 – 2024-03-08 (×13): 3 mL via INTRAVENOUS

## 2024-03-02 MED ORDER — IPRATROPIUM-ALBUTEROL 0.5-2.5 (3) MG/3ML IN SOLN
3.0000 mL | Freq: Four times a day (QID) | RESPIRATORY_TRACT | Status: DC
  Administered 2024-03-02 – 2024-03-04 (×6): 3 mL via RESPIRATORY_TRACT
  Filled 2024-03-02 (×7): qty 3

## 2024-03-02 MED ORDER — ACETAMINOPHEN 650 MG RE SUPP: 650.0000 mg | Freq: Four times a day (QID) | RECTAL | Status: DC | PRN

## 2024-03-02 MED ORDER — MELATONIN 3 MG PO TABS: 3.0000 mg | ORAL_TABLET | Freq: Every evening | ORAL | Status: DC | PRN

## 2024-03-02 MED ORDER — FUROSEMIDE 10 MG/ML IJ SOLN
40.0000 mg | Freq: Two times a day (BID) | INTRAMUSCULAR | Status: AC
  Administered 2024-03-02 – 2024-03-03 (×4): 40 mg via INTRAVENOUS
  Filled 2024-03-02 (×4): qty 4

## 2024-03-02 NOTE — Plan of Care (Signed)
  Problem: Nutritional: Goal: Maintenance of adequate nutrition will improve Outcome: Progressing   Problem: Clinical Measurements: Goal: Diagnostic test results will improve Outcome: Progressing Goal: Respiratory complications will improve Outcome: Progressing   Problem: Nutrition: Goal: Adequate nutrition will be maintained Outcome: Progressing

## 2024-03-02 NOTE — H&P (Signed)
 History and Physical    Patient: Jerry [name]. KGM:[id number] DOB: [birth year] DOA: 03/01/2024 DOS: the patient was seen and examined on 03/02/2024 PCP: Yolanda Hence, MD  Patient coming from: Home-lives with wife  Chief Complaint:  Chief Complaint  Patient presents with   Shortness of Breath   HPI: Jerry [name]. is a 83 y.o. male with medical history significant of asthma/COPD, hypertension, history of bladder cancer, nonobstructive CAD medically managed, dementia, dyslipidemia, hypertension, sleep apnea not on CPAP, CKD 3B, recent DVT with bilateral PE status post IVC filter.  Recent GI bleeding presumed secondary to known history of gastric and small bowel AVMs, type 2 diabetes mellitus diet-controlled with hemoglobin A1c during prior admission 6.2.  Recent discharge home on O2 at 3 L/min.  Known history of asthma/COPD.  Patient was discharged on 4/13 with home health services and O2 after an admission for popliteal DVT and bilateral PE.  Later developed GI bleeding prompting discontinuation of anticoagulation.  He underwent IVC filter placement.  Wife reports patient was significantly improved as compared to symptoms at time of admission.  After going home patient was very fatigued for the first 2 days and then eventually was able to participate with physical therapy and tolerate nurse visits.  24 hours prior to presentation he was very tired and complaining of right hip pain.  He began sleeping more than usual.  He was having dyspnea on exertion despite wearing his oxygen.  He has been coughing with very little sputum production.  Patient began having some mild swelling of the hands and legs so his wife contacted the PCP who resumed his low-dose Lasix  with first dose initiated this past Friday.  Patient symptoms persisted.  With his history of asthma/COPD they attempted use his normal inhalers as well as nebulizers without any improvement in symptoms.  Because of the symptoms he presented  to the ED via EMS for treatment.  Upon presentation he was afebrile, he was hemodynamically stable.  Apparently his O2 sats were dropping so his oxygen was increased to 4 L/min.  He remained a little bit tachypneic.  His chest x-ray was in his of either a small left effusion or pleural thickening with streaky atelectasis or infiltrate at the left.  His creatinine was slightly elevated compared to his baseline of around 2.2.  His potassium was slightly elevated at 5.0.  BUN was 111, troponin was 23 and 42.  Procalcitonin x 2 was <0.10.  Due to concerns for possible healthcare acquired pneumonia patient had been given a dose of maxed and vancomycin .  He did have leukocytosis of 11,100 with normal differential.  PCR for influenza, RSV and COVID was negative.  There were also concerns given the mildly elevated BNP that he may be experiencing failure exacerbation therefore he was given a one-time dose of IV Lasix  40 mg.  Hospitalist service was asked to evaluate the patient for admission.  According to his wife above history was confirmed.  Review of Systems: As mentioned in the history of present illness. All other systems reviewed and are negative. 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   Malnutrition of moderate degree 06/28/2023   Mixed hyperlipidemia 07/21/2021   OSA (obstructive sleep apnea) 07/12/2021   NPSG- 03/16/21- AHI 10.3/ hr, desaturation to 87%, body weight 190 lbs     Renal insufficiency 04/17/2015   Syncope 04/17/2015    Tremor 07/21/2021   Vascular dementia (HCC)    Vitamin D  deficiency 07/21/2021   Past Surgical History:  Procedure Laterality Date   BACK SURGERY     BIOPSY  10/09/2023   Procedure: BIOPSY;  Surgeon: Felecia Hopper, MD;  Location: [facility] ENDOSCOPY;  Service: Gastroenterology;;   CHOLECYSTECTOMY     COLONOSCOPY WITH PROPOFOL  Bilateral 10/09/2023   Procedure: COLONOSCOPY WITH PROPOFOL ;  Surgeon: Felecia Hopper, MD;  Location: [facility];  Service: Gastroenterology;  Laterality: Bilateral;   ESOPHAGOGASTRODUODENOSCOPY N/A 02/22/2024   Procedure: EGD (ESOPHAGOGASTRODUODENOSCOPY);  Surgeon: Felecia Hopper, MD;  Location: [facility] ENDOSCOPY;  Service: Gastroenterology;  Laterality: N/A;   ESOPHAGOGASTRODUODENOSCOPY (EGD) WITH PROPOFOL  Bilateral 10/09/2023   Procedure: ESOPHAGOGASTRODUODENOSCOPY (EGD) WITH PROPOFOL ;  Surgeon: Felecia Hopper, MD;  Location: [facility] ENDOSCOPY;  Service: Gastroenterology;  Laterality: Bilateral;   HOT HEMOSTASIS N/A 10/09/2023   Procedure: HOT HEMOSTASIS (ARGON PLASMA COAGULATION/BICAP);  Surgeon: Felecia Hopper, MD;  Location: [facility];  Service: Gastroenterology;  Laterality: N/A;   KNEE SURGERY Left    LOWER EXTREMITY VENOGRAPHY  01/29/2024   Procedure: LOWER EXTREMITY VENOGRAPHY;  Surgeon: Swaziland, Peter M, MD;  Location: [facility] INVASIVE CV LAB;  Service: Cardiovascular;;   RIGHT/LEFT HEART CATH AND CORONARY ANGIOGRAPHY N/A 01/29/2024   Procedure: RIGHT/LEFT HEART CATH AND CORONARY ANGIOGRAPHY;  Surgeon: Swaziland, Peter M, MD;  Location: [facility] INVASIVE CV LAB;  Service: Cardiovascular;  Laterality: N/A;   SHOULDER SURGERY     for rotator cuff tear   VENA CAVA UMBRELLA NECK APPROACH Right 02/22/2024   Procedure: INSERTION, UMBRELLA FILTER, INFERIOR VENA CAVA;  Surgeon: Adine Hoof, MD;  Location: [facility] OR;  Service: Vascular;  Laterality: Right;   Social History:  reports that he has never smoked. He has never used smokeless tobacco. He reports current  alcohol use of about 3.0 standard drinks of alcohol per week. He reports current drug use. Drug: Marijuana.  No Known Allergies  Family History  Problem Relation Age of Onset   Diabetes Mother    Diabetes Father     Prior to Admission medications   Medication Sig Start Date End Date Taking? Authorizing Provider  albuterol  (VENTOLIN  HFA) 108 (90 Base) MCG/ACT inhaler Inhale 2 puffs into the lungs every 6 (six) hours as needed for wheezing or shortness of breath. 07/12/21  Yes Young, Rupert Counts D, MD  ALPRAZolam  (XANAX ) 0.5 MG tablet Take 0.5 mg by mouth 2 (two) times daily as needed for anxiety.   Yes [provider]  amLODipine  (NORVASC ) 10 MG tablet Take 10 mg by mouth daily. 02/23/24  Yes [provider]  atorvastatin  (LIPITOR) 20 MG tablet Take 1 tablet (20 mg total) by mouth daily. 02/04/24 03/05/24 Yes Lesa Rape, MD  Chlorphen-PE-Acetaminophen  4-10-325 MG TABS Take 1 tablet by mouth daily as needed (For sinus). 02/20/24  Yes [provider]  Cholecalciferol  1000 UNITS tablet Take 1,000 Units by mouth daily.   Yes [provider]  Cyanocobalamin  (VITAMIN B12 PO) Take 1 tablet by mouth daily.   Yes [provider]  DULERA  200-5 MCG/ACT AERO Inhale 2 puffs into the lungs 2 (two) times daily.  Yes [provider]  escitalopram  (LEXAPRO ) 10 MG tablet Take 10 mg by mouth daily.   Yes [provider]  FeFum-FePoly-FA-B Cmp-C-Biot (FOLIVANE-PLUS) CAPS Take 1 capsule by mouth daily.   Yes [provider]  furosemide  (LASIX ) 20 MG tablet Take 20 mg by mouth daily. 02/18/24  Yes Mdala-Gausi, Masiku Agatha, MD  ipratropium-albuterol  (DUONEB) 0.5-2.5 (3) MG/3ML SOLN Inhale 3 mLs into the lungs every 2 (two) hours as needed (SOB/Wheezing).   Yes [provider]  memantine  (NAMENDA ) 5 MG tablet Take 10 mg by mouth 2 (two) times daily. 02/17/24  Yes [provider]  metoprolol  tartrate (LOPRESSOR ) 50 MG tablet Take 1 tablet  (50 mg total) by mouth 2 (two) times daily. Patient taking differently: Take 50 mg by mouth 2 (two) times daily as needed (Hypertension). 02/03/24 03/04/24 Yes Lesa Rape, MD  OLANZapine  (ZYPREXA ) 2.5 MG tablet Take 1 tablet (2.5 mg total) by mouth 2 (two) times daily as needed (agitation). 06/05/23  Yes Trish Furl, MD  pantoprazole  (PROTONIX ) 40 MG tablet Take 1 tablet (40 mg total) by mouth 2 (two) times daily. 02/18/24  Yes Mdala-Gausi, Jilda Most, MD    Physical Exam: Vitals:   03/02/24 0555 03/02/24 0556 03/02/24 0600 03/02/24 0622  BP: 118/74  119/71   Pulse: 75 74 73   Resp: (!) 23 (!) 29 (!) 32   Temp:    (!) 97.5 F (36.4 C)  TempSrc:    Oral  SpO2: 96% 98% 92%   Weight:      Height:       Constitutional: NAD, calm, comfortable Respiratory: Lung sounds diminished throughout more so in the bases.  A few scattered crackles and wheezes on expiration.. Normal respiratory effort. No accessory muscle use at rest.  On 4 L nasal cannula oxygen with O2 sats noted to be dropping down into the mid 80s especially while sleeping. Cardiovascular: Regular rate and rhythm, no murmurs / rubs / gallops.  1+ bilateral lower extremity edema. 2+ pedal pulses.  Abdomen: no tenderness, no masses palpated. No hepatosplenomegaly. Bowel sounds positive.  Musculoskeletal: no clubbing / cyanosis. No joint deformity upper and lower extremities. Good ROM, no contractures. Normal muscle tone.  Skin: no rashes, lesions, ulcers. No induration Neurologic: CN 2-12 grossly intact. Sensation intact,  Strength 3-4/5 x all 4 extremities.  Noted to have difficulty repositioning self tilt left side for pulmonary exam. Psychiatric: Sleeping.  Became mildly agitated/frustrated upon my removal of blankets to perform pulmonary exam.  Easily redirected.  Oriented to person only.   Data Reviewed:  Labs as above.  Assessment and Plan: Acute on chronic respiratory failure secondary to: COPD/asthma exacerbation Suspected  right heart failure exacerbation Presented with worsening hypoxemia, increasing shortness of breath, cough and dyspnea on exertion past 24 hours Despite 4 L nasal cannula O2 patient continue to have O2 sats in the mid 80s therefore have increased oxygen to 50% Ventimask.  Wean as tolerated. Chest x-ray inconclusive.  CT of the chest revealed chronic left basilar bronchiectasis with scattered mucous plugging Suspect symptoms are combination of COPD exacerbation as well as right heart failure exacerbation Echocardiogram during previous admission revealed hyperdynamic EF of 70 to 75% with mild concentric LVH, RV overload and diminished RV systolic function.  This is likely a combination pulmonary hypertension in combination with acute bilateral PE Will give Lasix  40 mg IV every 12 hours x 4 doses-reevaluate necessity of Lasix  in a.m. Strict I&O and daily weights Initiate scheduled DuoNebs every 6 hours  as well as budesonide  nebs every 12 hours and will give one-time dose of IV Solu-Medrol  noting patient has underlying dementia and wish to minimize use of steroids Begin scheduled Robitussin elixir every 6 hours and begin use of flutter valve Procalcitonin negative so no indication to continue antibiotics at this juncture Early mobilization Not on ARB or ACE inhibitor prior to admission nor on SGLT-p drug either  Recent DVT/PE Anticoagulation discontinued during previous admission due to GI bleeding symptoms; IVC placed  Elevated cardiac enzymes/known nonobstructive CAD Suspect secondary to demand ischemia from worsening hypoxemia and heart failure physiology Have asked for an additional set of cardiac enzymes noting previous mild increase from 23 to 42 EKG unremarkable and does not have any findings consistent with acute ischemia or infarct On as needed beta-blocker and statin prior to admission  Sleep apnea not on CPAP Continue oxygen as above  Recent GI bleeding presumed secondary to known  history of gastric and small bowel AVMs Anticoagulation on hold in favor of IVC filter Initial hemoglobin 9.3 and has drifted slightly to 8.8 Wife states he has had no signs of bleeding since arriving home  Diet-controlled diabetes mellitus 2 Hemoglobin A1c was 6.2 during previous admission Follow CBGs and provide SSI Carb modified diet  Hypertension Current blood pressure reading suboptimal will hold home Norvasc  and as needed Lopressor   Dementia Continue preadmission as needed Xanax , Namenda , and Zyprexa  Delirium precaution Family plans to stay at bedside with patient during hospitalization  Physical deconditioning Recent discharge to home with home health RN and PT after a 3-day admission PT and OT evaluation    Advance Care Planning:   Code Status: Full Code   VTE prophylaxis: SCDs and IVC filter  Consults: None  Family Communication: Wife at bedside  Severity of Illness: The appropriate patient status for this patient is INPATIENT. Inpatient status is judged to be reasonable and necessary in order to provide the required intensity of service to ensure the patient's safety. The patient's presenting symptoms, physical exam findings, and initial radiographic and laboratory data in the context of their chronic comorbidities is felt to place them at high risk for further clinical deterioration. Furthermore, it is not anticipated that the patient will be medically stable for discharge from the hospital within 2 midnights of admission.   * I certify that at the point of admission it is my clinical judgment that the patient will require inpatient hospital care spanning beyond 2 midnights from the point of admission due to high intensity of service, high risk for further deterioration and high frequency of surveillance required.*  Author: Kathye Parkin, NP 03/02/2024 6:35 AM  For on call review [email or web address].

## 2024-03-02 NOTE — Progress Notes (Signed)
 Carryover admission to the Day Admitter.  I discussed this case with the EDP, Atlee Leach, PA.  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  The patient was recently hospitalized approximately 10 days ago for acute gastrointestinal bleed as well as pneumonia, during which time he is reported to have been transfused 4 units PRBC and is treated with IV antibiotics.  At time of discharge, he was sent home on Augmentin, the course of which she has subsequently completed.  However, over the last few days, he has noted worsening shortness of breath, orthopnea, worsening peripheral edema in the bilateral lower extremities, worsening cough.  In the ED this evening, he is found to be mildly hypoxic on his baseline 3 L continuous nasal cannula, with improving oxygen saturations into the mid 90s on 4 L nasal cannula.  Today's chest x-ray shows interval development of left-sided pleural effusion as well as left lower lobe airspace opacity concerning for infiltrate.  He received a dose of IV Lasix  and was started on IV vancomycin  and cefepime  for concern regarding potential HCAP pneumonia.  I have placed an order for inpatient admission for further evaluation management of the above.  I have placed some additional preliminary admit orders via the adult multi-morbid admission order set. I have also ordered additional IV vancomycin  and cefepime  for potential HCAP pneumonia and  added on a procalcitonin level.  I have ordered additional IV Lasix , specifically 40 mg IV twice daily, with next dose to her in the morning.  I have also ordered morning labs in the form of CMP, CBC, magnesium  and phosphorus levels.    Camelia Cavalier, DO Hospitalist

## 2024-03-02 NOTE — ED Notes (Signed)
 CCMD called to add pt to monitoring services.

## 2024-03-02 NOTE — ED Notes (Signed)
 Pts wife would like to be called when pt goes upstairs. [phone]

## 2024-03-02 NOTE — ED Notes (Signed)
 This RN reached out to main lab. Per main lab " I think our tube station is down."

## 2024-03-02 NOTE — Progress Notes (Signed)
 Pharmacy Antibiotic Note  Jerry D Molnar Jr. is a 83 y.o. male admitted on 03/01/2024 with pneumonia.  Pharmacy has been consulted for vancomycin  dosing.  Plan: Vancomycin  750mg  q24h (eAUC 487, Scr 2.5)  F/u renal function, infectious work up and length of therapy Vancomycin  levels as needed  Height: 5\' 11"  (180.3 cm) Weight: 85 kg (187 lb 6.3 oz) IBW/kg (Calculated) : 75.3  Temp (24hrs), Avg:98.4 F (36.9 C), Min:98 F (36.7 C), Max:98.8 F (37.1 C)  Recent Labs  Lab 02/24/24 0537 03/02/24 0026  WBC 7.1 11.1*  CREATININE 2.01* 2.50*    Estimated Creatinine Clearance: 25.1 mL/min (A) (by C-G formula based on SCr of 2.5 mg/dL (H)).    No Known Allergies  Thank you for allowing pharmacy to be a part of this patient's care.  Fonda Hymen 03/02/2024 2:48 AM

## 2024-03-02 NOTE — Progress Notes (Signed)
 ED Pharmacy Antibiotic Sign Off An antibiotic consult was received from an ED provider for cefepime  and vancomycin  per pharmacy dosing for pneumonia. A chart review was completed to assess appropriateness.   The following one time order(s) were placed:  Cefepime  2g Vancomycin  2g   Further antibiotic and/or antibiotic pharmacy consults should be ordered by the admitting provider if indicated.   Thank you for allowing pharmacy to be a part of this patient's care.   Fonda Hymen, [facility]  Clinical Pharmacist 03/02/24 2:11 AM

## 2024-03-02 NOTE — ED Notes (Signed)
 PT OTF with RN transport no new onset distress at this time.

## 2024-03-03 ENCOUNTER — Inpatient Hospital Stay (HOSPITAL_COMMUNITY)

## 2024-03-03 DIAGNOSIS — I2694 Multiple subsegmental pulmonary emboli without acute cor pulmonale: Secondary | ICD-10-CM

## 2024-03-03 DIAGNOSIS — I2510 Atherosclerotic heart disease of native coronary artery without angina pectoris: Secondary | ICD-10-CM

## 2024-03-03 DIAGNOSIS — R0609 Other forms of dyspnea: Secondary | ICD-10-CM

## 2024-03-03 DIAGNOSIS — I50810 Right heart failure, unspecified: Secondary | ICD-10-CM | POA: Diagnosis not present

## 2024-03-03 DIAGNOSIS — I10 Essential (primary) hypertension: Secondary | ICD-10-CM

## 2024-03-03 DIAGNOSIS — I2609 Other pulmonary embolism with acute cor pulmonale: Secondary | ICD-10-CM

## 2024-03-03 DIAGNOSIS — J9621 Acute and chronic respiratory failure with hypoxia: Secondary | ICD-10-CM | POA: Diagnosis not present

## 2024-03-03 LAB — COMPREHENSIVE METABOLIC PANEL WITH GFR
ALT: 15 U/L (ref 0–44)
AST: 17 U/L (ref 15–41)
Albumin: 2.6 g/dL — ABNORMAL LOW (ref 3.5–5.0)
Alkaline Phosphatase: 55 U/L (ref 38–126)
Anion gap: 9 (ref 5–15)
BUN: 30 mg/dL — ABNORMAL HIGH (ref 8–23)
CO2: 25 mmol/L (ref 22–32)
Calcium: 8.7 mg/dL — ABNORMAL LOW (ref 8.9–10.3)
Chloride: 108 mmol/L (ref 98–111)
Creatinine, Ser: 2.27 mg/dL — ABNORMAL HIGH (ref 0.61–1.24)
GFR, Estimated: 28 mL/min — ABNORMAL LOW (ref >60)
Glucose, Bld: 155 mg/dL — ABNORMAL HIGH (ref 70–99)
Potassium: 5.2 mmol/L — ABNORMAL HIGH (ref 3.5–5.1)
Sodium: 142 mmol/L (ref 135–145)
Total Bilirubin: 0.4 mg/dL (ref 0.0–1.2)
Total Protein: 5.3 g/dL — ABNORMAL LOW (ref 6.5–8.1)

## 2024-03-03 LAB — ECHOCARDIOGRAM LIMITED
Height: 71 in
S' Lateral: 1.8 cm
Weight: 2959.46 [oz_av]

## 2024-03-03 LAB — CBC
HCT: 29.1 % — ABNORMAL LOW (ref 39.0–52.0)
HCT: 28.9 % — ABNORMAL LOW (ref 39.0–52.0)
Hemoglobin: 8.3 g/dL — ABNORMAL LOW (ref 13.0–17.0)
Hemoglobin: 8.5 g/dL — ABNORMAL LOW (ref 13.0–17.0)
MCH: 24.6 pg — ABNORMAL LOW (ref 26.0–34.0)
MCH: 24.6 pg — ABNORMAL LOW (ref 26.0–34.0)
MCHC: 28.5 g/dL — ABNORMAL LOW (ref 30.0–36.0)
MCHC: 29.4 g/dL — ABNORMAL LOW (ref 30.0–36.0)
MCV: 86.1 fL (ref 80.0–100.0)
MCV: 83.5 fL (ref 80.0–100.0)
Platelets: 217 10*3/uL (ref 150–400)
Platelets: 212 10*3/uL (ref 150–400)
RBC: 3.38 MIL/uL — ABNORMAL LOW (ref 4.22–5.81)
RBC: 3.46 MIL/uL — ABNORMAL LOW (ref 4.22–5.81)
RDW: 18.2 % — ABNORMAL HIGH (ref 11.5–15.5)
RDW: 18.5 % — ABNORMAL HIGH (ref 11.5–15.5)
WBC: 9.8 10*3/uL (ref 4.0–10.5)
WBC: 11.1 10*3/uL — ABNORMAL HIGH (ref 4.0–10.5)
nRBC: 0 % (ref 0.0–0.2)
nRBC: 0 % (ref 0.0–0.2)

## 2024-03-03 LAB — GLUCOSE, CAPILLARY
Glucose-Capillary: 103 mg/dL — ABNORMAL HIGH (ref 70–99)
Glucose-Capillary: 99 mg/dL (ref 70–99)
Glucose-Capillary: 71 mg/dL (ref 70–99)
Glucose-Capillary: 107 mg/dL — ABNORMAL HIGH (ref 70–99)

## 2024-03-03 LAB — TYPE AND SCREEN
ABO/RH(D): O POS
Antibody Screen: NEGATIVE

## 2024-03-03 MED ORDER — HEPARIN SODIUM (PORCINE) 5000 UNIT/ML IJ SOLN
5000.0000 [IU] | Freq: Three times a day (TID) | INTRAMUSCULAR | Status: DC
  Administered 2024-03-03: 5000 [IU] via SUBCUTANEOUS
  Filled 2024-03-03: qty 1

## 2024-03-03 MED ORDER — HEPARIN (PORCINE) 25000 UT/250ML-% IV SOLN
1050.0000 [IU]/h | INTRAVENOUS | Status: DC
  Administered 2024-03-03: 850 [IU]/h via INTRAVENOUS
  Administered 2024-03-04 – 2024-03-05 (×2): 1050 [IU]/h via INTRAVENOUS
  Filled 2024-03-03 (×3): qty 250

## 2024-03-03 MED ORDER — CYANOCOBALAMIN 500 MCG PO TABS
250.0000 ug | ORAL_TABLET | Freq: Every day | ORAL | Status: DC
  Administered 2024-03-03 – 2024-03-08 (×6): 250 ug via ORAL
  Filled 2024-03-03 (×6): qty 1

## 2024-03-03 MED ORDER — AMLODIPINE BESYLATE 10 MG PO TABS
10.0000 mg | ORAL_TABLET | Freq: Every day | ORAL | Status: DC
  Administered 2024-03-03 – 2024-03-08 (×6): 10 mg via ORAL
  Filled 2024-03-03 (×6): qty 1

## 2024-03-03 MED ORDER — ATORVASTATIN CALCIUM 10 MG PO TABS
20.0000 mg | ORAL_TABLET | Freq: Every day | ORAL | Status: DC
  Administered 2024-03-03 – 2024-03-08 (×6): 20 mg via ORAL
  Filled 2024-03-03 (×6): qty 2

## 2024-03-03 MED ORDER — PANTOPRAZOLE SODIUM 40 MG IV SOLR
40.0000 mg | Freq: Two times a day (BID) | INTRAVENOUS | Status: DC
  Administered 2024-03-03 – 2024-03-05 (×6): 40 mg via INTRAVENOUS
  Filled 2024-03-03 (×6): qty 10

## 2024-03-03 NOTE — Evaluation (Signed)
 Physical Therapy Evaluation Patient Details Name: Jerry [name]. MRN: [id number] DOB: May 17, 1941 Today's Date: 03/03/2024  History of Present Illness  83 y.o. male presents to [facility] on 03/02/24 with acute on chronic respiratory failure secondary to COPD/asthma exacerbation and suspected R heart failure exacerbation. 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  Clinical Impression  Pt admitted with above diagnosis. PTA he was getting HHPT and progressing, lives with wife. Able to ambulate 65 feet today, SpO2 dropped sharply towards end of distance on 8L HFNC to 84% with 3/3 dyspnea. Improved rapidly with seated rest break. Min assist for transfer due to posterior lean. Anticipate as he resolves medically, pt function will improve and HHPT will be appropriate. Will update recs for different level of care if pt fails to improve or wife feels she cannot assist at home as needed. For now we plan to progress patient as tolerated. Pt currently with functional limitations due to the deficits listed below (see PT Problem List). Pt will benefit from acute skilled PT to increase their independence and safety with mobility to allow discharge.      On HFNC at 6L O2 at rest, 92% SpO2. While ambulating SpO2 dropped to 84% on 8L. Resolved <2 minutes upon sitting with cues for breathing techniques, weaned back to 6L at >90% SpO2.     If plan is discharge home, recommend the following: A little help with walking and/or transfers;A little help with bathing/dressing/bathroom;Assistance with cooking/housework;Assist for transportation;Help with stairs or ramp for entrance   Can travel by private vehicle        Equipment Recommendations None recommended by PT  Recommendations for Other Services       Functional Status Assessment Patient has had a recent  decline in their functional status and demonstrates the ability to make significant improvements in function in a reasonable and predictable amount of time.     Precautions / Restrictions Precautions Precautions: Fall Recall of Precautions/Restrictions: Impaired Precaution/Restrictions Comments: watch O2 (3 L O2 at baseline) Restrictions Weight Bearing Restrictions Per Provider Order: No      Mobility  Bed Mobility Overal bed mobility: Needs Assistance Bed Mobility: Supine to Sit     Supine to sit: HOB elevated, Contact guard     General bed mobility comments: CGA for safety, a little slow and effortful but without physical assist.    Transfers Overall transfer level: Needs assistance Equipment used: Rolling walker (2 wheels) Transfers: Sit to/from Stand Sit to Stand: Min assist           General transfer comment: Min assist for balance to rise from bed, leaning posteriorly. Cues for technique, anterior weight shift and and hand to RW upon standing.    Ambulation/Gait Ambulation/Gait assistance: Contact guard assist Gait Distance (Feet): 65 Feet Assistive device: Rolling walker (2 wheels) Gait Pattern/deviations: Step-through pattern, Trunk flexed, Decreased stride length, Shuffle Gait velocity: decr Gait velocity interpretation: <1.31 ft/sec, indicative of household ambulator   General Gait Details: Slightly shuffled, trunk flexed, no stumbling or overt LOB noted. LEs do appear a bit weak, reliant on RW for support. Educated on AD use with RW, proximity to device to maximize safety, energy conservation, symptom awareness. SpO2 >90% for majority of distance then, dropped sharply to 84% on 8L towards end of distance, 3/3 dyspnea. Sat down for <2 minuets and returned >90% again with improvement dyspnea.  Stairs            Wheelchair Mobility     Tilt Bed    Modified Rankin (Stroke Patients Only)       Balance Overall balance assessment: Needs  assistance Sitting-balance support: No upper extremity supported, Feet supported Sitting balance-Leahy Scale: Good     Standing balance support: During functional activity, Bilateral upper extremity supported Standing balance-Leahy Scale: Poor Standing balance comment: reliant on RW                             Pertinent Vitals/Pain Pain Assessment Pain Assessment: No/denies pain    Home Living Family/patient expects to be discharged to:: Private residence Living Arrangements: Spouse/significant other Available Help at Discharge: Available 24 hours/day Type of Home: House Home Access: Stairs to enter Entrance Stairs-Rails: Right Entrance Stairs-Number of Steps: 3   Home Layout: One level Home Equipment: Grab bars - toilet;Shower seat;Grab bars - Chartered loss adjuster (2 wheels);Cane - single point Additional Comments: recently with HHOT, PT and RN    Prior Function Prior Level of Function : Independent/Modified Independent             Mobility Comments: reports using cane if going outside of the home ADLs Comments: Indep with ADLs, wife assists with IADLs     Extremity/Trunk Assessment   Upper Extremity Assessment Upper Extremity Assessment: Defer to OT evaluation    Lower Extremity Assessment Lower Extremity Assessment: Generalized weakness    Cervical / Trunk Assessment Cervical / Trunk Assessment: Kyphotic  Communication   Communication Communication: No apparent difficulties    Cognition Arousal: Alert Behavior During Therapy: WFL for tasks assessed/performed   PT - Cognitive impairments: No family/caregiver present to determine baseline, History of cognitive impairments                         Following commands: Intact Following commands impaired: Follows one step commands with increased time     Cueing Cueing Techniques: Verbal cues, Gestural cues     General Comments General comments (skin integrity, edema, etc.): 6L  O2 at rest, 92% SpO2. While ambulating SpO2 dropped to 84% on 8L. Resolved <2 minutes upon sitting with cues for breathing techniques, weaned back to 6L at >90% SpO2.    Exercises     Assessment/Plan    PT Assessment Patient needs continued PT services  PT Problem List Decreased strength;Decreased activity tolerance;Decreased balance;Decreased mobility;Cardiopulmonary status limiting activity;Decreased knowledge of use of DME;Decreased cognition       PT Treatment Interventions DME instruction;Gait training;Stair training;Functional mobility training;Therapeutic activities;Therapeutic exercise;Balance training;Patient/family education;Neuromuscular re-education    PT Goals (Current goals can be found in the Care Plan section)  Acute Rehab PT Goals Patient Stated Goal: to get better PT Goal Formulation: With patient Time For Goal Achievement: 03/17/24 Potential to Achieve Goals: Good    Frequency Min 2X/week     Co-evaluation               AM-PAC PT "6 Clicks" Mobility  Outcome Measure Help needed turning from your back to your side while in a flat bed without using bedrails?: A Little Help needed moving from lying on your back to sitting on the side of a flat bed without using bedrails?: A Little Help needed moving to and from a bed to a chair (including a wheelchair)?: A Little Help needed standing up from a chair using your arms (e.g., wheelchair  or bedside chair)?: A Little Help needed to walk in hospital room?: A Little Help needed climbing 3-5 steps with a railing? : Total 6 Click Score: 16    End of Session Equipment Utilized During Treatment: Gait belt Activity Tolerance: No increased pain Patient left: with call bell/phone within reach;in bed;in chair;with chair alarm set Nurse Communication: Mobility status PT Visit Diagnosis: Unsteadiness on feet (R26.81);Other abnormalities of gait and mobility (R26.89);Muscle weakness (generalized) (M62.81);Difficulty in  walking, not elsewhere classified (R26.2)    Time: 1610-9604 PT Time Calculation (min) (ACUTE ONLY): 25 min   Charges:   PT Evaluation $PT Eval Low Complexity: 1 Low PT Treatments $Gait Training: 8-22 mins PT General Charges $$ ACUTE PT VISIT: 1 Visit         Jory Ng, PT, DPT [facility]  Rehabilitation Services Physical Therapist Office: [phone] Website: [email or web address]   Alinda Irani 03/03/2024, 3:28 PM

## 2024-03-03 NOTE — TOC Initial Note (Signed)
 Transition of Care (TOC) - Initial/Assessment Note    Patient Details  Name: Jerry [name]. MRN: [id number] Date of Birth: [birth year]  Transition of Care [facility] CM/SW Contact:    Jannine Meo, RN Phone Number: 03/03/2024, 2:25 PM  Clinical Narrative:   Patient from home with c/o shortness of breath. Patient has home O2 from [employer] at 3L. Patient home concentrator goes up to 5 L. Current patient of Gasper Karst for [facility] services. Patient has rolling walker and rollator at home. Continue to watch O2 home needs and orders for resumption of HH care.                   Patient Goals and CMS Choice            Expected Discharge Plan and Services                                              Prior Living Arrangements/Services                       Activities of Daily Living      Permission Sought/Granted                  Emotional Assessment              Admission diagnosis:  Acute respiratory failure with hypoxia (HCC) [J96.01] Acute on chronic hypoxic respiratory failure (HCC) [J96.21] Patient Active Problem List   Diagnosis Date Noted   Acute on chronic hypoxic respiratory failure (HCC) 03/02/2024   GI bleed 02/21/2024   DVT (deep venous thrombosis) (HCC) 02/21/2024   Hyperkalemia 02/21/2024   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) 02/21/2024   Hypotension 02/21/2024   Vascular dementia (HCC) 02/21/2024   Non-insulin  dependent type 2 diabetes mellitus (HCC) 02/21/2024   Acute pulmonary embolism (HCC) 02/16/2024   SOB (shortness of breath) 02/15/2024   Pulmonary hypertension (HCC) 02/12/2024   Type 2 MI (myocardial infarction) (HCC) 01/30/2024   Shortness of breath 01/27/2024   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) 01/22/2024   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 12/21/2023   Complication of Ileal conduit (HCC) 12/21/2023   Bilateral leg edema 12/13/2023   Screening for diabetes mellitus  12/13/2023   Malnutrition of moderate degree 06/28/2023   CAP (community acquired pneumonia) 06/26/2023   History of malignant neoplasm of bladder 07/21/2021   Allergic rhinitis 07/21/2021   Asthma, chronic 07/21/2021   Essential hypertension 07/21/2021   Degenerative cervical spinal stenosis 07/21/2021   Mixed hyperlipidemia 07/21/2021   Tremor 07/21/2021   Vitamin D  deficiency 07/21/2021   OSA (obstructive sleep apnea) 07/12/2021   Asthma, mild intermittent    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 [place] [address] Phone: [phone] Fax: [phone]     Social Drivers of Health (SDOH) Social History: SDOH Screenings   Food Insecurity: No Food Insecurity (02/15/2024)  Housing: Low Risk  (02/15/2024)  Transportation Needs: No Transportation Needs (  02/15/2024)  Utilities: Not At Risk (02/15/2024)  Social Connections: Socially Integrated (02/15/2024)  Tobacco Use: Low Risk  (02/22/2024)   SDOH Interventions:     Readmission Risk Interventions    02/18/2024    2:20 PM 06/27/2023   12:12 PM  Readmission Risk Prevention Plan  Transportation Screening Complete Complete  PCP or Specialist Appt within 5-7 Days  Complete  PCP or Specialist Appt within 3-5 Days Complete   Home Care Screening  Complete  Medication Review (RN CM)  Complete  HRI or Home Care Consult Complete   Palliative Care Screening Not Applicable   Medication Review (RN Care Manager) Complete

## 2024-03-03 NOTE — Evaluation (Signed)
 Occupational Therapy Evaluation Patient Details Name: Jerry [name]. MRN: [id number] DOB: [birth year] Today's Date: 03/03/2024   History of Present Illness   83 y.o. male presents to [facility] on 03/02/24 with acute on chronic respiratory failure secondary to COPD/asthma exacerbation and suspected R heart failure exacerbation. 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 Pt reports wearing 3 L O2 at baseline. Pt presents now with deficits in cardiopulmonary endurance, dynamic standing balance, strength and baseline cognitive deficits. Overall, pt able to mobilize in room without AD w/ CGA and requires no more than Min A for LB ADLs. 3/4 DOE noted with brief standing ADLs at sink, delayed SpO2 reading with desats to 88% on 6 L O2, improving to 90s within 2-3 min seated rest break. Will follow acutely for energy conservation education and ADL retraining. Recommend HHOT follow up upon DC.     If plan is discharge home, recommend the following:   A little help with walking and/or transfers;Assistance with cooking/housework;Direct supervision/assist for medications management;Direct supervision/assist for financial management;Assist for transportation;Help with stairs or ramp for entrance;A little help with bathing/dressing/bathroom     Functional Status Assessment   Patient has had a recent decline in their functional status and demonstrates the ability to make significant improvements in function in a reasonable and predictable amount of time.     Equipment Recommendations   None recommended by OT     Recommendations for Other Services         Precautions/Restrictions    Precautions Precautions: Fall Precaution/Restrictions Comments: watch O2 (3 L O2 at baseline) Restrictions Weight Bearing Restrictions Per Provider Order: No     Mobility Bed Mobility Overal bed mobility: Needs Assistance Bed Mobility: Supine to Sit     Supine to sit: HOB elevated, Supervision          Transfers Overall transfer level: Needs assistance Equipment used: None Transfers: Sit to/from Stand Sit to Stand: Contact guard assist           General transfer comment: use of momentum and second attempt to stand from lower bedside      Balance Overall balance assessment: Needs assistance Sitting-balance support: No upper extremity supported, Feet supported Sitting balance-Leahy Scale: Good     Standing balance support: No upper extremity supported, During functional activity Standing balance-Leahy Scale: Fair                             ADL either performed or assessed with clinical judgement   ADL Overall ADL's : Needs assistance/impaired Eating/Feeding: Independent;Sitting   Grooming: Supervision/safety;Standing;Wash/dry face   Upper Body Bathing: Set up;Sitting   Lower Body Bathing: Minimal assistance;Sit to/from stand   Upper Body Dressing : Set up;Sitting   Lower Body Dressing: Minimal assistance;Sit to/from stand Lower Body Dressing Details (indicate cue type and reason): assist for socks Toilet Transfer: Contact guard assist;Ambulation   Toileting- Clothing Manipulation and Hygiene: Contact guard assist;Sit to/from stand       Functional mobility during ADLs: Contact guard assist       Vision Baseline Vision/History: 1 Wears glasses Ability to See in Adequate Light: 0 Adequate Patient Visual Report: No change from baseline Vision Assessment?:  No apparent visual deficits     Perception         Praxis         Pertinent Vitals/Pain Pain Assessment Pain Assessment: No/denies pain     Extremity/Trunk Assessment  Upper Extremity Assessment Upper Extremity Assessment: Overall WFL for tasks assessed;Right hand dominant   Lower Extremity Assessment Lower Extremity Assessment: Defer to PT evaluation   Cervical / Trunk Assessment Cervical / Trunk Assessment: Kyphotic   Communication Communication Communication: No apparent difficulties   Cognition Arousal: Alert Behavior During Therapy: WFL for tasks assessed/performed Cognition: History of cognitive impairments             OT - Cognition Comments: hx of dementia, pleasant, follows directions, fairly accurate PLOF report. education needed on reasons for SOB and swelling in B feet                 Following commands: Intact Following commands impaired: Follows one step commands with increased time     Cueing  General Comments   Cueing Techniques: Verbal cues;Tactile cues  on 6 L O2, 3/4 DOE with activity, delayed O2 reading 88%- likely slightly below this but recovered to 90s within 2 min seated rest break   Exercises     Shoulder Instructions      Home Living Family/patient expects to be discharged to:: Private residence Living Arrangements: Spouse/significant other Available Help at Discharge: Available 24 hours/day Type of Home: House Home Access: Stairs to enter [employer] of Steps: 3 Entrance Stairs-Rails: Right Home Layout: One level     Bathroom Shower/Tub: Producer, television/film/video: Handicapped height     Home Equipment: Grab bars - toilet;Shower seat;Grab bars - Chartered loss adjuster (2 wheels);Cane - single point   Additional Comments: recently with HHOT, PT and RN      Prior Functioning/Environment Prior Level of Function : Independent/Modified Independent             Mobility Comments: reports using cane if going outside of the home ADLs Comments: Indep with ADLs, wife assists with IADLs    OT Problem List: Decreased strength;Impaired balance (sitting and/or  standing);Decreased activity tolerance;Decreased cognition;Decreased safety awareness;Decreased knowledge of use of DME or AE   OT Treatment/Interventions: Self-care/ADL training;DME and/or AE instruction;Therapeutic activities;Patient/family education;Balance training      OT Goals(Current goals can be found in the care plan section)   Acute Rehab OT Goals Patient Stated Goal: improve breathing, wife hopeful to talk to MD OT Goal Formulation: With patient Time For Goal Achievement: 03/17/24 Potential to Achieve Goals: Good ADL Goals Pt Will Perform Lower Body Bathing: with modified independence;sit to/from stand Pt Will Perform Lower Body Dressing: with modified independence;sitting/lateral leans;sit to/from stand Pt Will Transfer to Toilet: with modified independence;ambulating Pt/caregiver will Perform Home Exercise Program: Increased strength;Both right and left upper extremity;With theraband;Independently;With written HEP provided Additional ADL Goal #1: Pt to implement at least 3 energy conservation strategies during ADLs/mobility   OT Frequency:  Min 2X/week    Co-evaluation              AM-PAC OT "6 Clicks" Daily Activity     Outcome Measure Help from another person eating meals?: None Help from another person taking care of personal grooming?: A Little Help from another person toileting, which includes using toliet, bedpan, or urinal?: A Little Help from another person bathing (including washing, rinsing, drying)?: A Little Help from another person to put on and taking off regular upper body clothing?: A  Little Help from another person to put on and taking off regular lower body clothing?: A Little 6 Click Score: 19   End of Session Equipment Utilized During Treatment: Gait belt;Oxygen Nurse Communication: Mobility status  Activity Tolerance: Patient tolerated treatment well Patient left: in chair;with call bell/phone within reach;with chair alarm set;with  family/visitor present  OT Visit Diagnosis: Unsteadiness on feet (R26.81);Other abnormalities of gait and mobility (R26.89);Muscle weakness (generalized) (M62.81);Other symptoms and signs involving cognitive function;Other (comment)                Time: 8119-1478 OT Time Calculation (min): 31 min Charges:  OT General Charges $OT Visit: 1 Visit OT Evaluation $OT Eval Moderate Complexity: 1 Mod OT Treatments $Self Care/Home Management : 8-22 mins  Lawrence Pretty, OTR/L Acute Rehab Services Office: [phone]   Annabella Barr 03/03/2024, 8:02 AM

## 2024-03-03 NOTE — Plan of Care (Signed)

## 2024-03-03 NOTE — Progress Notes (Signed)
 PROGRESS NOTE    Jerry [name].  WUJ:[id number] DOB: 1941-04-10 DOA: 03/01/2024 PCP: Yolanda Hence, MD    Chief Complaint  Patient presents with   Shortness of Breath    Brief Narrative:  Jerry [name]. is a 83 y.o. male with medical history significant of asthma/COPD, hypertension, history of bladder cancer, nonobstructive CAD medically managed, dementia, dyslipidemia, hypertension, sleep apnea not on CPAP, CKD 3B, recent DVT with bilateral PE status post IVC filter.  Recent GI bleeding presumed secondary to known history of gastric and colon AVMs, type 2 diabetes mellitus diet-controlled with hemoglobin A1c during [name] admission 6.2.  Recent discharge home on O2 at 3 L/min.  Known history of asthma/COPD.  - Patient presents to ED secondary to complaints of shortness of breath, his workup was significant for increased oxygen, he is on 7 L at rest this morning.  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 Pulmonary hypertension -PE presents with worsening dyspnea, up from 3 L to to 7 L oxygen -Was encouraged with incentive spirometry, flutter valve, in the setting of some atelectasis and mucous plugging - Known history of pulmonary hypertension, diagnosed with sleep apnea in the past but unable to tolerate,, I have discussed with patient and wife that he needs to have sleep study as an outpatient to start back on CPAP -recent diagnosis of PE, on anticoagulation due to acute blood loss anemia from GI bleed, status post IVC filter, unclear if respiratory failure due to increased clot burden, cannot perform CTA PE protocol given AKI, so limited ultrasound has been obtained with evidence of increased strain on the right heart. -Continue with IV Lasix  -Continue with telemetry monitoring -Cardiology  consulted. -Will hold on antibiotics given negative procalcitonin   Recent DVT/PE -Anticoagulation discontinued during previous admission due to GI bleeding symptoms; IVC placed -Significant concern that increased right heart strain is related to his DVT and PE increased clot burden, will challenge again with heparin  drip no bolus and on the lower therapeutic range   Elevated cardiac enzymes/known nonobstructive CAD Suspect secondary to demand ischemia from worsening hypoxemia and heart failure physiology Have asked for an additional set of cardiac enzymes noting previous mild increase from 23 to 42 EKG unremarkable and does not have any findings consistent with acute ischemia or infarct On as needed beta-blocker and statin [name] to admission   Sleep apnea not on CPAP Continue oxygen as above   Recent GI bleeding presumed secondary to known history of gastric and small bowel AVMs Anticoagulation on hold in favor of IVC filter Initial hemoglobin 9.3 and has drifted slightly to 8.8 Wife states he has had no signs of bleeding since arriving home   Diet-controlled diabetes mellitus 2 Hemoglobin A1c was 6.2 during previous admission Follow CBGs and provide SSI Carb modified diet   Hypertension Current blood pressure reading suboptimal will hold home Norvasc  and as needed Lopressor    Dementia Continue preadmission as needed Xanax , Namenda , and Zyprexa  Delirium precaution Family plans to stay at bedside with patient during hospitalization   Physical deconditioning PT OT consulted    DVT prophylaxis: Subcutaneous heparin >> heparin  GTT Code Status: Full code Family Communication: discussed with wife and daughter at bedside Disposition:   Status is: Inpatient    Consultants:  Cardiology   Subjective:  Reports dyspnea and cough.  Objective: Vitals:   03/03/24 0411  03/03/24 0700 03/03/24 0758 03/03/24 1100  BP: 135/79 (!) 149/82  (!) 150/82  Pulse: 89 78  (!) 104   Resp: 19 19  20   Temp: 98.1 F (36.7 C) 98.4 F (36.9 C)  97.8 F (36.6 C)  TempSrc: Oral Oral  Oral  SpO2: 99% 96% 96% 96%  Weight: 83.9 kg     Height:        Intake/Output Summary (Last 24 hours) at 03/03/2024 1352 Last data filed at 03/03/2024 0650 Gross per 24 hour  Intake 60 ml  Output 2350 ml  Net -2290 ml   Filed Weights   03/01/24 2233 03/03/24 0411  Weight: 85 kg 83.9 kg    Examination:  Awake Alert, Oriented X 3, No new F.N deficits, Normal affect Symmetrical Chest wall movement, scattered Rales bilaterally RRR,No Gallops,Rubs or new Murmurs, No Parasternal Heave +ve B.Sounds, Abd Soft, No tenderness, No rebound - guarding or rigidity. No Cyanosis, Clubbing , +2 edema, No new Rash or bruise      Data Reviewed: I have personally reviewed following labs and imaging studies  CBC: Recent Labs  Lab 03/01/24 2322 03/02/24 0026 03/03/24 0444  WBC 11.2* 11.1* 9.8  NEUTROABS 7.1 7.0  --   HGB 9.3* 8.8* 8.3*  HCT 34.0* 31.3* 29.1*  MCV 90.9 91.0 86.1  PLT 226 239 217    Basic Metabolic Panel: Recent Labs  Lab 03/01/24 2322 03/02/24 0026 03/03/24 0444  NA 141 139 142  K 4.7 5.0 5.2*  CL 108 108 108  CO2 24 23 25   GLUCOSE 139* 129* 155*  BUN 22 22 30*  CREATININE 2.64* 2.50* 2.27*  CALCIUM  8.9 8.7* 8.7*  MG 1.6*  --   --   PHOS 2.9  --   --     GFR: Estimated Creatinine Clearance: 27.6 mL/min (A) (by C-G formula based on SCr of 2.27 mg/dL (H)).  Liver Function Tests: Recent Labs  Lab 03/01/24 2322 03/03/24 0444  AST 22 17  ALT 18 15  ALKPHOS 61 55  BILITOT 0.5 0.4  PROT 6.1* 5.3*  ALBUMIN 3.1* 2.6*    CBG: Recent Labs  Lab 03/02/24 1308 03/02/24 1622 03/02/24 2024 03/03/24 0751 03/03/24 1111  GLUCAP 151* 189* 237* 103* 99     Recent Results (from the past 240 hours)  Resp panel by RT-PCR (RSV, Flu A&B, Covid) Anterior Nasal Swab     Status: None   Collection Time: 03/01/24 10:50 PM   Specimen: Anterior Nasal Swab  Result  Value Ref Range Status   SARS Coronavirus 2 by RT PCR NEGATIVE NEGATIVE Final   Influenza A by PCR NEGATIVE NEGATIVE Final   Influenza B by PCR NEGATIVE NEGATIVE Final    Comment: (NOTE) The Xpert Xpress SARS-CoV-2/FLU/RSV plus assay is intended as an aid in the diagnosis of influenza from Nasopharyngeal swab specimens and should not be used as a sole basis for treatment. Nasal washings and aspirates are unacceptable for Xpert Xpress SARS-CoV-2/FLU/RSV testing.  Fact Sheet for Patients: BloggerCourse.com  Fact Sheet for Healthcare Providers: SeriousBroker.it  This test is not yet approved or cleared by the [place]  FDA and has been authorized for detection and/or diagnosis of SARS-CoV-2 by FDA under an Emergency Use Authorization (EUA). This EUA will remain in effect (meaning this test can be used) for the duration of the COVID-19 declaration under Section 564(b)(1) of the Act, 21 U.S.C. section 360bbb-3(b)(1), unless the authorization is terminated or revoked.     Resp Syncytial Virus by  PCR NEGATIVE NEGATIVE Final    Comment: (NOTE) Fact Sheet for Patients: BloggerCourse.com  Fact Sheet for Healthcare Providers: SeriousBroker.it  This test is not yet approved or cleared by the [place]  FDA and has been authorized for detection and/or diagnosis of SARS-CoV-2 by FDA under an Emergency Use Authorization (EUA). This EUA will remain in effect (meaning this test can be used) for the duration of the COVID-19 declaration under Section 564(b)(1) of the Act, 21 U.S.C. section 360bbb-3(b)(1), unless the authorization is terminated or revoked.  Performed at [facility], [address] [place], [place] [address]          Radiology Studies: ECHOCARDIOGRAM LIMITED Result Date: 03/03/2024    ECHOCARDIOGRAM LIMITED REPORT   Patient Name:   Farmer Mccahill. Date of  Exam: 03/03/2024 Medical Rec #:  [id number]         Height:       71.0 in Accession #:    [id number]        Weight:       185.0 lb Date of Birth:  03-Apr-1941          BSA:          2.040 m Patient Age:    82 years          BP:           135/79 mmHg Patient Gender: M                 HR:           96 bpm. Exam Location:  Inpatient Procedure: Limited Echo, Cardiac Doppler and Color Doppler (Both Spectral and            Color Flow Doppler were utilized during procedure). Indications:    Dyspnea R06.00  History:        Patient has [name] history of Echocardiogram examinations, most                 recent 02/22/2024. Signs/Symptoms:Dyspnea and Shortness of                 Breath; Risk Factors:Hypertension.  Sonographer:    Astrid Blamer Referring Phys: 21 Ryle Buscemi S Johnisha Louks IMPRESSIONS  1. Left ventricular ejection fraction, by estimation, is 55 to 60%. The left ventricle has normal function. The left ventricle has no regional wall motion abnormalities. There is mild concentric left ventricular hypertrophy. Left ventricular diastolic parameters are indeterminate.  2. D-shaped septum suggestive of RV pressure/volume overload. Right ventricular systolic function is moderately reduced. The right ventricular size is moderately enlarged. There is severely elevated pulmonary artery systolic pressure. The estimated right ventricular systolic pressure is 68.5 mmHg.  3. The mitral valve was not assessed.  4. The inferior vena cava is normal in size with <50% respiratory variability, suggesting right atrial pressure of 8 mmHg.  5. A small pericardial effusion is present. The pericardial effusion is localized near the right ventricle.  6. Limited echo FINDINGS  Left Ventricle: Left ventricular ejection fraction, by estimation, is 55 to 60%. The left ventricle has normal function. The left ventricle has no regional wall motion abnormalities. The left ventricular internal cavity size was normal in size. There is  mild concentric left  ventricular hypertrophy. Left ventricular diastolic parameters are indeterminate. Right Ventricle: D-shaped septum suggestive of RV pressure/volume overload. The right ventricular size is moderately enlarged. Right ventricular systolic function is moderately reduced. There is severely elevated pulmonary artery systolic pressure. The tricuspid regurgitant  velocity is 3.89 m/s, and with an assumed right atrial pressure of 8 mmHg, the estimated right ventricular systolic pressure is 68.5 mmHg. Pericardium: A small pericardial effusion is present. The pericardial effusion is localized near the right ventricle. Mitral Valve: The mitral valve was not assessed. Tricuspid Valve: The tricuspid valve is normal in structure. Tricuspid valve regurgitation is mild. Aorta: The aortic root is normal in size and structure. Venous: The inferior vena cava is normal in size with less than 50% respiratory variability, suggesting right atrial pressure of 8 mmHg. LEFT VENTRICLE PLAX 2D LVIDd:         3.40 cm LVIDs:         1.80 cm LV PW:         1.60 cm LV IVS:        1.30 cm LVOT diam:     2.00 cm LVOT Area:     3.14 cm  RIGHT VENTRICLE             IVC RV Basal diam:  4.10 cm     IVC diam: 1.90 cm RV Mid diam:    3.60 cm RV S prime:     12.90 cm/s TAPSE (M-mode): 1.5 cm  AORTA Ao Root diam: 3.00 cm TRICUSPID VALVE TR Peak grad:   60.5 mmHg TR Vmax:        389.00 cm/s  SHUNTS Systemic Diam: 2.00 cm Dalton McleanMD Electronically signed by Archer Bear Signature Date/Time: 03/03/2024/10:55:41 AM    Final    CT CHEST WO CONTRAST Result Date: 03/02/2024 CLINICAL DATA:  Streaky atelectasis or infiltrate in the left base on chest x-ray, small left effusion. Respiratory illness with suspected complication. Shortness of breath all day long yesterday. EXAM: CT CHEST WITHOUT CONTRAST TECHNIQUE: Multidetector CT imaging of the chest was performed following the standard protocol without IV contrast. RADIATION DOSE REDUCTION: This exam was  performed according to the departmental dose-optimization program which includes automated exposure control, adjustment of the mA and/or kV according to patient size and/or use of iterative reconstruction technique. COMPARISON:  Portable chest yesterday, portable chest 02/21/2024, CTA chest 02/15/2024, and chest CT without contrast 01/27/2024. FINDINGS: Cardiovascular: The heart is slightly enlarged but smaller than previously. Small pericardial effusion is unchanged. There are three-vessel coronary calcifications, aortic atherosclerosis. No aortic aneurysm is seen. No dilatation of the pulmonary arteries and veins. The last study demonstrated bilateral lower lobe scattered subsegmental arterial emboli which may or may not still be present. A 2 vessel aortic arch again noted with normal variant brachiobicarotid trunk. Mediastinum/Nodes: No enlarged mediastinal or axillary lymph nodes. Solitary prominent right mid hilar lymph node again measures 1.4 cm short axis on 3:91. Thyroid  gland, trachea, and esophagus demonstrate no significant findings. In addition, left to right mediastinal shift, largely due to left lower lobe volume loss seems unchanged. Lungs/Pleura: There is chronic left lower lobe basal bronchiectasis and scattered mucous plugging, with chronic posterior basal atelectasis and volume loss. There is additional chronic band atelectasis in the dorsal lingula. There are additional linear scar-like opacities in the bases, both apices. The lungs are mildly emphysematous with centrilobular paraseptal changes the upper lobes. No new or acute lung infiltrate is seen. There is a stable 5 mm middle lobe nodule on 4:102. Lungs are otherwise clear. There is no pleural effusion. No pneumothorax. Upper Abdomen: Renal and hepatic cysts are again noted, status post cholecystectomy and IVC filter in place. Abdominal aortic atherosclerosis. No acute upper abdominal findings. Musculoskeletal: Mild thoracic dextroscoliosis  with  osteopenia and degenerative change of the spine. Lumbar fusion hardware and laminectomy beginning at L3 incompletely imaged. No acute or significant osseous findings.  Chest wall mass. IMPRESSION: 1. No acute chest CT findings. 2. Chronic left lower lobe basal bronchiectasis and scattered mucous plugging, with chronic posterior basal atelectasis and volume loss. 3. Chronic band atelectasis in the dorsal lingula. 4. Emphysema. 5. Aortic and coronary artery atherosclerosis. 6. Stable 5 mm middle lobe nodule. 7. Stable prominent right hilar lymph node. 8. Osteopenia and degenerative change. 9. IVC filter in Situ. Aortic Atherosclerosis (ICD10-I70.0) and Emphysema (ICD10-J43.9). Electronically Signed   By: Denman Fischer M.D.   On: 03/02/2024 07:27   DG Chest Port 1 View Result Date: 03/01/2024 CLINICAL DATA:  Shortness of breath and cough EXAM: PORTABLE CHEST 1 VIEW COMPARISON:  02/21/2024, 06/26/2023, 07/08/2021 FINDINGS: Mild cardiomegaly. Right lung is clear. Small left effusion or pleural thickening. Streaky atelectasis or infiltrate left base without change. Stable cardiomediastinal silhouette IMPRESSION: Small left effusion or pleural thickening with streaky atelectasis or infiltrate at the left base. Electronically Signed   By: Esmeralda Hedge M.D.   On: 03/01/2024 23:51        Scheduled Meds:  budesonide  (PULMICORT ) nebulizer solution  0.25 mg Nebulization BID   escitalopram   10 mg Oral Daily   furosemide   40 mg Intravenous Q12H   guaiFENesin   5 mL Oral Q6H   heparin  injection (subcutaneous)  5,000 Units Subcutaneous Q8H   insulin  aspart  0-6 Units Subcutaneous TID WC   ipratropium-albuterol   3 mL Nebulization Q6H   memantine   10 mg Oral BID   sodium chloride  flush  3 mL Intravenous Q12H   Continuous Infusions:   LOS: 1 day        Seena Dadds, MD [facility]   To contact the attending provider between 7A-7P or the covering provider during after hours 7P-7A,  please log into the web site [email or web address] and access using universal [place] password for that web site. If you do not have the password, please call the hospital operator.  03/03/2024, 1:52 PM

## 2024-03-03 NOTE — Consult Note (Addendum)
 Cardiology Consultation   Patient ID: Jerry [name]. MRN: [id number]; DOB: 1941/09/23  Admit date: 03/01/2024 Date of Consult: 03/03/2024  PCP:  Yolanda Hence, MD   [facility] Providers Cardiologist:  Cody Das, MD   {   Patient Profile:   Jerry [name]. is a 83 y.o. male with a hx of pulmonary hypertension, mild RV systolic dysfunction, hypertension, hyperlipidemia, stage IIIb CKD, COPD, prostate cancer status post radial prostatectomy, history of PE, type 2 diabetes who is being seen 03/03/2024 for the evaluation of heart failure at the request of Seena Dadds MD.  History of Present Illness:   Jerry [name] is an 83 year old male with prior cardiac history shown below. Was seen by Dr. Filiberto Hug on 11/2023 for dyspnea on exertion an echo showed severe pulmonary hypertension and mild RV systolic dysfunction.  He was placed on Lasix  and had not followed up yet when he was admitted to [facility] on 01/2024 for dyspnea on exertion and dizziness.  He had hypoxic respiratory failure and  high-sensitivity troponins trended up to 6000+. Had a left and right heart cath on 01/2024 that showed mild nonobstructive CAD, elevated LV filling pressures LVEDP 19 mm Hg. PCWP 24/27 with mean 19 mm Hg, Moderate pulmonary HTN. PAP 65/30 with mean 40 mm Hg, Cardiac output 5.49 L/min, index 2.83.  He was diagnosed with a type II NSTEMI that was secondary to pneumonia, COPD exacerbation, AKI, and sepsis.  Was last seen by Dr. Filiberto Hug on 02/12/2024.  Was admitted for shortness of breath on 02/15/2024 pulmonary CTA showed bilateral lower lobe segmental pulmonary emboli.  He was started on IV heparin  and sent home on Eliquis .  On 02/21/24 he was admitted for a gastrointestinal hemorrhage and required 4 units of PRBC's. He had an IVC filter placed on 02/22/2024 and anticoagulation was stopped.  Jerry [name] presented to the ER on 03/01/24 with worsening shortness of breath and  requiring increased oxygen (7 L at rest up from 3L).  He had JVD and Rales and bilateral lower extremity edema.  Was started on IV Lasix  and is net out 3.2L since admission.  Creatinine was 2.64 initially and is trended down to 2.27 today, hemoglobin is 8.3 today down from 9.3.  BNP on admission was 111.1, high-sensitivity troponins were slightly elevated and flat.  Chest x-ray showed small left pleural effusion or pleural thickening at the left base.  Chest CT showed no acute findings,  Continues to remain on 6 L nasal cannula to maintain SpO2 saturation.   On interview he denies chest pain, palpitations, diaphoresis, nausea, vomiting, hematochezia, melena, orthopnea, PND. Has continuing dyspnea on exertion that is unchanged this hospitalization.  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   Malnutrition of moderate degree 06/28/2023   Mixed hyperlipidemia 07/21/2021   OSA (obstructive sleep apnea) 07/12/2021   NPSG- 03/16/21- AHI 10.3/ hr, desaturation to 87%, body weight 190 lbs     Renal insufficiency 04/17/2015   Syncope 04/17/2015  Tremor 07/21/2021   Vascular dementia (HCC)    Vitamin D  deficiency 07/21/2021    Past Surgical History:  Procedure Laterality Date   BACK SURGERY     BIOPSY  10/09/2023   Procedure: BIOPSY;  Surgeon: Felecia Hopper, MD;  Location: [facility] ENDOSCOPY;  Service: Gastroenterology;;   CHOLECYSTECTOMY     COLONOSCOPY WITH PROPOFOL  Bilateral 10/09/2023   Procedure: COLONOSCOPY WITH PROPOFOL ;   Surgeon: Felecia Hopper, MD;  Location: [facility] ENDOSCOPY;  Service: Gastroenterology;  Laterality: Bilateral;   ESOPHAGOGASTRODUODENOSCOPY N/A 02/22/2024   Procedure: EGD (ESOPHAGOGASTRODUODENOSCOPY);  Surgeon: Felecia Hopper, MD;  Location: [facility] ENDOSCOPY;  Service: Gastroenterology;  Laterality: N/A;   ESOPHAGOGASTRODUODENOSCOPY (EGD) WITH PROPOFOL  Bilateral 10/09/2023   Procedure: ESOPHAGOGASTRODUODENOSCOPY (EGD) WITH PROPOFOL ;  Surgeon: Felecia Hopper, MD;  Location: [facility] ENDOSCOPY;  Service: Gastroenterology;  Laterality: Bilateral;   HOT HEMOSTASIS N/A 10/09/2023   Procedure: HOT HEMOSTASIS (ARGON PLASMA COAGULATION/BICAP);  Surgeon: Felecia Hopper, MD;  Location: [facility];  Service: Gastroenterology;  Laterality: N/A;   KNEE SURGERY Left    LOWER EXTREMITY VENOGRAPHY  01/29/2024   Procedure: LOWER EXTREMITY VENOGRAPHY;  Surgeon: Swaziland, Peter M, MD;  Location: [facility] INVASIVE CV LAB;  Service: Cardiovascular;;   RIGHT/LEFT HEART CATH AND CORONARY ANGIOGRAPHY N/A 01/29/2024   Procedure: RIGHT/LEFT HEART CATH AND CORONARY ANGIOGRAPHY;  Surgeon: Swaziland, Peter M, MD;  Location: [facility] INVASIVE CV LAB;  Service: Cardiovascular;  Laterality: N/A;   SHOULDER SURGERY     for rotator cuff tear   VENA CAVA UMBRELLA NECK APPROACH Right 02/22/2024   Procedure: INSERTION, UMBRELLA FILTER, INFERIOR VENA CAVA;  Surgeon: Adine Hoof, MD;  Location: [facility] OR;  Service: Vascular;  Laterality: Right;     Home Medications:  Prior to Admission medications   Medication Sig Start Date End Date Taking? Authorizing Provider  albuterol  (VENTOLIN  HFA) 108 (90 Base) MCG/ACT inhaler Inhale 2 puffs into the lungs every 6 (six) hours as needed for wheezing or shortness of breath. 07/12/21  Yes Young, Rupert Counts D, MD  ALPRAZolam  (XANAX ) 0.5 MG tablet Take 0.5 mg by mouth 2 (two) times daily as needed for anxiety.   Yes [provider]  amLODipine  (NORVASC ) 10 MG tablet Take 10 mg by mouth daily. 02/23/24   Yes [provider]  atorvastatin  (LIPITOR) 20 MG tablet Take 1 tablet (20 mg total) by mouth daily. 02/04/24 03/05/24 Yes Lesa Rape, MD  Chlorphen-PE-Acetaminophen  4-10-325 MG TABS Take 1 tablet by mouth daily as needed (For sinus). 02/20/24  Yes [provider]  Cholecalciferol  1000 UNITS tablet Take 1,000 Units by mouth daily.   Yes [provider]  Cyanocobalamin  (VITAMIN B12 PO) Take 1 tablet by mouth daily.   Yes [provider]  DULERA  200-5 MCG/ACT AERO Inhale 2 puffs into the lungs 2 (two) times daily.   Yes [provider]  escitalopram  (LEXAPRO ) 10 MG tablet Take 10 mg by mouth daily.   Yes [provider]  FeFum-FePoly-FA-B Cmp-C-Biot (FOLIVANE-PLUS) CAPS Take 1 capsule by mouth daily.   Yes [provider]  furosemide  (LASIX ) 20 MG tablet Take 20 mg by mouth daily. 02/18/24  Yes Mdala-Gausi, Masiku Agatha, MD  ipratropium-albuterol  (DUONEB) 0.5-2.5 (3) MG/3ML SOLN Inhale 3 mLs into the lungs every 2 (two) hours as needed (SOB/Wheezing).   Yes [provider]  memantine  (NAMENDA ) 5 MG tablet Take 10 mg by mouth 2 (two) times daily. 02/17/24  Yes [provider]  metoprolol  tartrate (LOPRESSOR ) 50 MG tablet Take 1 tablet (50 mg total) by mouth 2 (  two) times daily. Patient taking differently: Take 50 mg by mouth 2 (two) times daily as needed (Hypertension). 02/03/24 03/04/24 Yes Lesa Rape, MD  OLANZapine  (ZYPREXA ) 2.5 MG tablet Take 1 tablet (2.5 mg total) by mouth 2 (two) times daily as needed (agitation). 06/05/23  Yes Trish Furl, MD  pantoprazole  (PROTONIX ) 40 MG tablet Take 1 tablet (40 mg total) by mouth 2 (two) times daily. 02/18/24  Yes Mdala-Gausi, Masiku Agatha, MD    Inpatient Medications: Scheduled Meds:  amLODipine   10 mg Oral Daily   atorvastatin   20 mg Oral Daily   budesonide  (PULMICORT ) nebulizer solution  0.25 mg Nebulization BID   cyanocobalamin   250 mcg Oral Daily   escitalopram   10 mg Oral Daily    furosemide   40 mg Intravenous Q12H   guaiFENesin   5 mL Oral Q6H   heparin  injection (subcutaneous)  5,000 Units Subcutaneous Q8H   insulin  aspart  0-6 Units Subcutaneous TID WC   ipratropium-albuterol   3 mL Nebulization Q6H   memantine   10 mg Oral BID   pantoprazole  (PROTONIX ) IV  40 mg Intravenous Q12H   sodium chloride  flush  3 mL Intravenous Q12H   Continuous Infusions:  PRN Meds: acetaminophen  **OR** acetaminophen , albuterol , ALPRAZolam , melatonin, OLANZapine , ondansetron  (ZOFRAN ) IV  Allergies:   No Known Allergies  Social History:   Social History   Socioeconomic History   Marital status: Married    Spouse name: Not on file   Number of children: Not on file   Years of education: Not on file   Highest education level: Not on file  Occupational History   Not on file  Tobacco Use   Smoking status: Never   Smokeless tobacco: Never  Vaping Use   Vaping status: Never Used  Substance and Sexual Activity   Alcohol use: Yes    Alcohol/week: 3.0 standard drinks of alcohol    Types: 3 Standard drinks or equivalent per week   Drug use: Yes    Types: Marijuana   Sexual activity: Not on file  Other Topics Concern   Not on file  Social History Narrative   Not on file   Social Drivers of Health   Financial Resource Strain: Not on file  Food Insecurity: No Food Insecurity (02/15/2024)   Hunger Vital Sign    Worried About Running Out of Food in the Last Year: Never true    Ran Out of Food in the Last Year: Never true  Transportation Needs: No Transportation Needs (02/15/2024)   PRAPARE - Administrator, Civil Service (Medical): No    Lack of Transportation (Non-Medical): No  Physical Activity: Not on file  Stress: Not on file  Social Connections: Socially Integrated (02/15/2024)   Social Connection and Isolation Panel [NHANES]    Frequency of Communication with Friends and Family: Three times a week    Frequency of Social Gatherings with Friends and Family:  Twice a week    Attends Religious Services: 1 to 4 times per year    Active Member of [employer] or Organizations: Yes    Attends Banker Meetings: 1 to 4 times per year    Marital Status: Married  Catering manager Violence: Not At Risk (02/15/2024)   Humiliation, Afraid, Rape, and Kick questionnaire    Fear of Current or Ex-Partner: No    Emotionally Abused: No    Physically Abused: No    Sexually Abused: No    Family History:    Family History  Problem Relation Age  of Onset   Diabetes Mother    Diabetes Father      ROS:  Please see the history of present illness.   All other ROS reviewed and negative.     Physical Exam/Data:   Vitals:   03/03/24 0411 03/03/24 0700 03/03/24 0758 03/03/24 1100  BP: 135/79 (!) 149/82  (!) 150/82  Pulse: 89 78  (!) 104  Resp: 19 19  20   Temp: 98.1 F (36.7 C) 98.4 F (36.9 C)  97.8 F (36.6 C)  TempSrc: Oral Oral  Oral  SpO2: 99% 96% 96% 96%  Weight: 83.9 kg     Height:        Intake/Output Summary (Last 24 hours) at 03/03/2024 1434 Last data filed at 03/03/2024 1300 Gross per 24 hour  Intake 60 ml  Output 3350 ml  Net -3290 ml      03/03/2024    4:11 AM 03/01/2024   10:33 PM 02/23/2024    4:05 AM  Last 3 Weights  Weight (lbs) 184 lb 15.5 oz 187 lb 6.3 oz 185 lb 10 oz  Weight (kg) 83.9 kg 85 kg 84.2 kg     Body mass index is 25.8 kg/m.  General:  Well nourished, well developed, in no acute distress HEENT: normal Neck: no JVD Vascular: No carotid bruits; Distal pulses 2+ bilaterally Cardiac:  normal S1, S2; RRR; no murmur  Lungs:  Crackles in bilateral lower lungs. Abd: soft, nontender, no hepatomegaly  Ext: 1+ edema Musculoskeletal:  No deformities,  Skin: warm and dry  Neuro:  no focal abnormalities noted Psych:  Normal affect   EKG:  The EKG was personally reviewed and demonstrates:  normal sinus rhythm with a rate of 86, anterior t wave inversions similar to prior EKG's Telemetry:  Telemetry was personally  reviewed and demonstrates:  Normal sinus rhythm in the 80's  Relevant CV Studies: Echo IMPRESSIONS     1. Left ventricular ejection fraction, by estimation, is 55 to 60%. The  left ventricle has normal function. The left ventricle has no regional  wall motion abnormalities. There is mild concentric left ventricular  hypertrophy. Left ventricular diastolic  parameters are indeterminate.   2. D-shaped septum suggestive of RV pressure/volume overload. Right  ventricular systolic function is moderately reduced. The right ventricular  size is moderately enlarged. There is severely elevated pulmonary artery  systolic pressure. The estimated  right ventricular systolic pressure is 68.5 mmHg.   3. The mitral valve was not assessed.   4. The inferior vena cava is normal in size with <50% respiratory  variability, suggesting right atrial pressure of 8 mmHg.   5. A small pericardial effusion is present. The pericardial effusion is  localized near the right ventricle.   6. Limited echo   Laboratory Data:  High Sensitivity Troponin:   Recent Labs  Lab 02/15/24 0619 03/02/24 0026 03/02/24 0159 03/02/24 0900 03/02/24 1139  TROPONINIHS 22* 23* 42* 55* 42*     Chemistry Recent Labs  Lab 03/01/24 2322 03/02/24 0026 03/03/24 0444  NA 141 139 142  K 4.7 5.0 5.2*  CL 108 108 108  CO2 24 23 25   GLUCOSE 139* 129* 155*  BUN 22 22 30*  CREATININE 2.64* 2.50* 2.27*  CALCIUM  8.9 8.7* 8.7*  MG 1.6*  --   --   GFRNONAA 24* 25* 28*  ANIONGAP 9 8 9     Recent Labs  Lab 03/01/24 2322 03/03/24 0444  PROT 6.1* 5.3*  ALBUMIN 3.1* 2.6*  AST 22  17  ALT 18 15  ALKPHOS 61 55  BILITOT 0.5 0.4   Lipids No results for input(s): "CHOL", "TRIG", "HDL", "LABVLDL", "LDLCALC", "CHOLHDL" in the last 168 hours.  Hematology Recent Labs  Lab 03/01/24 2322 03/02/24 0026 03/03/24 0444  WBC 11.2* 11.1* 9.8  RBC 3.74* 3.44* 3.38*  HGB 9.3* 8.8* 8.3*  HCT 34.0* 31.3* 29.1*  MCV 90.9 91.0 86.1   MCH 24.9* 25.6* 24.6*  MCHC 27.4* 28.1* 28.5*  RDW 18.9* 18.8* 18.2*  PLT 226 239 217   Thyroid  No results for input(s): "TSH", "FREET4" in the last 168 hours.  BNP Recent Labs  Lab 03/02/24 0026  BNP 111.1*    DDimer No results for input(s): "DDIMER" in the last 168 hours.   Radiology/Studies:  ECHOCARDIOGRAM LIMITED Result Date: 03/03/2024    ECHOCARDIOGRAM LIMITED REPORT   Patient Name:   Jerry [name]. Date of Exam: 03/03/2024 Medical Rec #:  [id number]         Height:       71.0 in Accession #:    [id number]        Weight:       185.0 lb Date of Birth:  Oct 28, 1941          BSA:          2.040 m Patient Age:    82 years          BP:           135/79 mmHg Patient Gender: M                 HR:           96 bpm. Exam Location:  Inpatient Procedure: Limited Echo, Cardiac Doppler and Color Doppler (Both Spectral and            Color Flow Doppler were utilized during procedure). Indications:    Dyspnea R06.00  History:        Patient has prior history of Echocardiogram examinations, most                 recent 02/22/2024. Signs/Symptoms:Dyspnea and Shortness of                 Breath; Risk Factors:Hypertension.  Sonographer:    Astrid Blamer Referring Phys: [id number] DAWOOD S ELGERGAWY IMPRESSIONS  1. Left ventricular ejection fraction, by estimation, is 55 to 60%. The left ventricle has normal function. The left ventricle has no regional wall motion abnormalities. There is mild concentric left ventricular hypertrophy. Left ventricular diastolic parameters are indeterminate.  2. D-shaped septum suggestive of RV pressure/volume overload. Right ventricular systolic function is moderately reduced. The right ventricular size is moderately enlarged. There is severely elevated pulmonary artery systolic pressure. The estimated right ventricular systolic pressure is 68.5 mmHg.  3. The mitral valve was not assessed.  4. The inferior vena cava is normal in size with <50% respiratory variability, suggesting right  atrial pressure of 8 mmHg.  5. A small pericardial effusion is present. The pericardial effusion is localized near the right ventricle.  6. Limited echo FINDINGS  Left Ventricle: Left ventricular ejection fraction, by estimation, is 55 to 60%. The left ventricle has normal function. The left ventricle has no regional wall motion abnormalities. The left ventricular internal cavity size was normal in size. There is  mild concentric left ventricular hypertrophy. Left ventricular diastolic parameters are indeterminate. Right Ventricle: D-shaped septum suggestive of RV pressure/volume overload. The right ventricular size is moderately enlarged. Right  ventricular systolic function is moderately reduced. There is severely elevated pulmonary artery systolic pressure. The tricuspid regurgitant velocity is 3.89 m/s, and with an assumed right atrial pressure of 8 mmHg, the estimated right ventricular systolic pressure is 68.5 mmHg. Pericardium: A small pericardial effusion is present. The pericardial effusion is localized near the right ventricle. Mitral Valve: The mitral valve was not assessed. Tricuspid Valve: The tricuspid valve is normal in structure. Tricuspid valve regurgitation is mild. Aorta: The aortic root is normal in size and structure. Venous: The inferior vena cava is normal in size with less than 50% respiratory variability, suggesting right atrial pressure of 8 mmHg. LEFT VENTRICLE PLAX 2D LVIDd:         3.40 cm LVIDs:         1.80 cm LV PW:         1.60 cm LV IVS:        1.30 cm LVOT diam:     2.00 cm LVOT Area:     3.14 cm  RIGHT VENTRICLE             IVC RV Basal diam:  4.10 cm     IVC diam: 1.90 cm RV Mid diam:    3.60 cm RV S prime:     12.90 cm/s TAPSE (M-mode): 1.5 cm  AORTA Ao Root diam: 3.00 cm TRICUSPID VALVE TR Peak grad:   60.5 mmHg TR Vmax:        389.00 cm/s  SHUNTS Systemic Diam: 2.00 cm Jerry McleanMD Electronically signed by Archer Bear Signature Date/Time: 03/03/2024/10:55:41 AM    Final     CT CHEST WO CONTRAST Result Date: 03/02/2024 CLINICAL DATA:  Streaky atelectasis or infiltrate in the left base on chest x-ray, small left effusion. Respiratory illness with suspected complication. Shortness of breath all day long yesterday. EXAM: CT CHEST WITHOUT CONTRAST TECHNIQUE: Multidetector CT imaging of the chest was performed following the standard protocol without IV contrast. RADIATION DOSE REDUCTION: This exam was performed according to the departmental dose-optimization program which includes automated exposure control, adjustment of the mA and/or kV according to patient size and/or use of iterative reconstruction technique. COMPARISON:  Portable chest yesterday, portable chest 02/21/2024, CTA chest 02/15/2024, and chest CT without contrast 01/27/2024. FINDINGS: Cardiovascular: The heart is slightly enlarged but smaller than previously. Small pericardial effusion is unchanged. There are three-vessel coronary calcifications, aortic atherosclerosis. No aortic aneurysm is seen. No dilatation of the pulmonary arteries and veins. The last study demonstrated bilateral lower lobe scattered subsegmental arterial emboli which may or may not still be present. A 2 vessel aortic arch again noted with normal variant brachiobicarotid trunk. Mediastinum/Nodes: No enlarged mediastinal or axillary lymph nodes. Solitary prominent right mid hilar lymph node again measures 1.4 cm short axis on 3:91. Thyroid  gland, trachea, and esophagus demonstrate no significant findings. In addition, left to right mediastinal shift, largely due to left lower lobe volume loss seems unchanged. Lungs/Pleura: There is chronic left lower lobe basal bronchiectasis and scattered mucous plugging, with chronic posterior basal atelectasis and volume loss. There is additional chronic band atelectasis in the dorsal lingula. There are additional linear scar-like opacities in the bases, both apices. The lungs are mildly emphysematous with  centrilobular paraseptal changes the upper lobes. No new or acute lung infiltrate is seen. There is a stable 5 mm middle lobe nodule on 4:102. Lungs are otherwise clear. There is no pleural effusion. No pneumothorax. Upper Abdomen: Renal and hepatic cysts are again noted, status post cholecystectomy and  IVC filter in place. Abdominal aortic atherosclerosis. No acute upper abdominal findings. Musculoskeletal: Mild thoracic dextroscoliosis with osteopenia and degenerative change of the spine. Lumbar fusion hardware and laminectomy beginning at L3 incompletely imaged. No acute or significant osseous findings.  Chest wall mass. IMPRESSION: 1. No acute chest CT findings. 2. Chronic left lower lobe basal bronchiectasis and scattered mucous plugging, with chronic posterior basal atelectasis and volume loss. 3. Chronic band atelectasis in the dorsal lingula. 4. Emphysema. 5. Aortic and coronary artery atherosclerosis. 6. Stable 5 mm middle lobe nodule. 7. Stable prominent right hilar lymph node. 8. Osteopenia and degenerative change. 9. IVC filter in Situ. Aortic Atherosclerosis (ICD10-I70.0) and Emphysema (ICD10-J43.9). Electronically Signed   By: Denman Fischer M.D.   On: 03/02/2024 07:27   DG Chest Port 1 View Result Date: 03/01/2024 CLINICAL DATA:  Shortness of breath and cough EXAM: PORTABLE CHEST 1 VIEW COMPARISON:  02/21/2024, 06/26/2023, 07/08/2021 FINDINGS: Mild cardiomegaly. Right lung is clear. Small left effusion or pleural thickening. Streaky atelectasis or infiltrate left base without change. Stable cardiomediastinal silhouette IMPRESSION: Small left effusion or pleural thickening with streaky atelectasis or infiltrate at the left base. Electronically Signed   By: Esmeralda Hedge M.D.   On: 03/01/2024 23:51     Assessment and Plan:   Fredrich Cory. is a 83 y.o. male with a hx of pulmonary hypertension, mild RV systolic dysfunction, hypertension, hyperlipidemia, stage IIIb CKD, COPD, prostate cancer  status post radial prostatectomy, history of PE, type 2 diabetes, vascular dementia who is being seen 03/03/2024 for the evaluation of heart failure at the request of Seena Dadds MD.  Acute on chronic respiratory failure Pulmonary hypertension with right heart failure Recent pulmonary embolism COPD/Asthma exacerbation Was diagnosed with a PE on 02/15/2024 was started on Eliquis  but had a GI hemorrhage.  IVC filter was placed on 02/22/2024 and anticoagulation was stopped. Presented to the ER with worsening dyspnea on exertion.  Required 7 L of oxygen at baseline.  Previously required 3 L of oxygen.  BNP on admission was 111.1, this x-ray showed small left pleural effusion or pleural thickening at the left base.  Chest CT showed no acute findings.  Was started on IV Lasix  and is down 3.2 liters since admission.  Current weight is 83.9kgs, this is up from 80.1 kgs on 02/12/2024.  Continues to have crackles on lower lung lobes. -- Continue IV diuresis -- Continue nebulizers for COPD per primary. -- Unable to get a pulmonary CTA to assess for PE due to poor renal function. -- Echo showed mild LV, a D shaped septum RV systolic pressure was estimated at 68.5 mmHg, a small pericardial effusion was present.  These findings were similar to the echo on 12/2023 that estimated RV systolic pressure at 63.2 mmHg. -- With RV systolic pressure similar to previous pressures prior to worsening dyspnea.  Suspect that recent dyspnea is multifactorial.  Mild non obstructive CAD Elevated high sensitivity troponins Suspect that elevated troponins are secondary to demand ischemia from the acute on chronic respiratory failure. -- continue atorvastatin    Hypertension -- Continue amlodipine  10mg  daily  AKI on stage IIIb CKD -- Managed per primary  Other conditions managed per primary  Risk Assessment/Risk Scores:        [place]  Heart Association (NYHA) Functional Class NYHA Class III        For questions or  updates, please contact [facility] Please consult [email or web address] for contact info under    Signed, Ayumi Wangerin,  PA-C  03/03/2024 2:34 PM

## 2024-03-03 NOTE — Progress Notes (Signed)
 PHARMACY - ANTICOAGULATION CONSULT NOTE  Pharmacy Consult for Heparin  Indication:  recent PE and LLE DVT (02/15/24)  No Known Allergies  Patient Measurements: Height: 5\' 11"  (180.3 cm) Weight: 83.9 kg (184 lb 15.5 oz) IBW/kg (Calculated) : 75.3 HEPARIN  DW (KG): 85  Vital Signs: Temp: 97.8 F (36.6 C) (04/21 1100) Temp Source: Oral (04/21 1100) BP: 150/82 (04/21 1100) Pulse Rate: 104 (04/21 1100)  Labs: Recent Labs    03/01/24 2322 03/01/24 2322 03/02/24 0026 03/02/24 0159 03/02/24 0900 03/02/24 1139 03/03/24 0444  HGB 9.3*  --  8.8*  --   --   --  8.3*  HCT 34.0*  --  31.3*  --   --   --  29.1*  PLT 226  --  239  --   --   --  217  CREATININE 2.64*  --  2.50*  --   --   --  2.27*  TROPONINIHS  --    < > 23* 42* 55* 42*  --    < > = values in this interval not displayed.    Estimated Creatinine Clearance: 27.6 mL/min (A) (by C-G formula based on SCr of 2.27 mg/dL (H)).   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  Readmitted 02/22/24 with GI bleeding,  presumed due to hx gastric and small bowel AVMs.  Anticoagulation discontinued and IVC filter placed 02/22/24.  Admitted 03/02/24 with shortness of breath. Concern for clot burden contributing to worsening pulmonary status and to begin IV heparin  conservatively and without bolus, low therapeutic target levels.  SQ heparin  x 1 given at 1436. No known bleeding since arrival.  On Protonix  40 mg IV q12hrs.  Goal of Therapy:  Heparin  level 0.3-0.5 units/ml Monitor platelets by anticoagulation protocol: Yes   Plan:  Discontinue SQ heparin  Begin heparin  drip conservatively at 850 units/hr (~10 units/kg/hr) Heparin  level ~8 hrs after drip begins. Daily heparin  level and CBC. Monitor for signs/symptoms of bleeding.  Adolphus Akin, [place] 03/03/2024,3:04 PM

## 2024-03-04 DIAGNOSIS — J9621 Acute and chronic respiratory failure with hypoxia: Secondary | ICD-10-CM | POA: Diagnosis not present

## 2024-03-04 DIAGNOSIS — I2609 Other pulmonary embolism with acute cor pulmonale: Secondary | ICD-10-CM | POA: Diagnosis not present

## 2024-03-04 LAB — BASIC METABOLIC PANEL WITH GFR
Anion gap: 7 (ref 5–15)
BUN: 27 mg/dL — ABNORMAL HIGH (ref 8–23)
CO2: 24 mmol/L (ref 22–32)
Calcium: 9.2 mg/dL (ref 8.9–10.3)
Chloride: 109 mmol/L (ref 98–111)
Creatinine, Ser: 1.94 mg/dL — ABNORMAL HIGH (ref 0.61–1.24)
GFR, Estimated: 34 mL/min — ABNORMAL LOW (ref >60)
Glucose, Bld: 94 mg/dL (ref 70–99)
Potassium: 4.4 mmol/L (ref 3.5–5.1)
Sodium: 140 mmol/L (ref 135–145)

## 2024-03-04 LAB — GLUCOSE, CAPILLARY
Glucose-Capillary: 87 mg/dL (ref 70–99)
Glucose-Capillary: 142 mg/dL — ABNORMAL HIGH (ref 70–99)
Glucose-Capillary: 125 mg/dL — ABNORMAL HIGH (ref 70–99)
Glucose-Capillary: 155 mg/dL — ABNORMAL HIGH (ref 70–99)

## 2024-03-04 LAB — HEPARIN LEVEL (UNFRACTIONATED)
Heparin Unfractionated: 0.49 [IU]/mL (ref 0.30–0.70)
Heparin Unfractionated: <0.1 [IU]/mL — ABNORMAL LOW (ref 0.30–0.70)
Heparin Unfractionated: <0.1 [IU]/mL — ABNORMAL LOW (ref 0.30–0.70)

## 2024-03-04 LAB — CBC
HCT: 29.6 % — ABNORMAL LOW (ref 39.0–52.0)
Hemoglobin: 8.7 g/dL — ABNORMAL LOW (ref 13.0–17.0)
MCH: 24.4 pg — ABNORMAL LOW (ref 26.0–34.0)
MCHC: 29.4 g/dL — ABNORMAL LOW (ref 30.0–36.0)
MCV: 83.1 fL (ref 80.0–100.0)
Platelets: 223 10*3/uL (ref 150–400)
RBC: 3.56 MIL/uL — ABNORMAL LOW (ref 4.22–5.81)
RDW: 18.6 % — ABNORMAL HIGH (ref 11.5–15.5)
WBC: 10.9 10*3/uL — ABNORMAL HIGH (ref 4.0–10.5)
nRBC: 0 % (ref 0.0–0.2)

## 2024-03-04 MED ORDER — METOPROLOL TARTRATE 50 MG PO TABS: 50.0000 mg | ORAL_TABLET | Freq: Two times a day (BID) | ORAL | Status: DC

## 2024-03-04 MED ORDER — IPRATROPIUM-ALBUTEROL 0.5-2.5 (3) MG/3ML IN SOLN
3.0000 mL | Freq: Two times a day (BID) | RESPIRATORY_TRACT | Status: DC
  Administered 2024-03-04 – 2024-03-08 (×8): 3 mL via RESPIRATORY_TRACT
  Filled 2024-03-04 (×8): qty 3

## 2024-03-04 MED ORDER — FUROSEMIDE 10 MG/ML IJ SOLN
40.0000 mg | Freq: Two times a day (BID) | INTRAMUSCULAR | Status: DC
  Administered 2024-03-04 – 2024-03-05 (×3): 40 mg via INTRAVENOUS
  Filled 2024-03-04 (×3): qty 4

## 2024-03-04 NOTE — Progress Notes (Addendum)
   03/04/24 0939  Mobility  Activity Transferred from bed to chair  Level of Assistance Contact guard assist, steadying assist  Assistive Device Front wheel walker  Activity Response Tolerated fair  Mobility Referral Yes  Mobility visit 1 Mobility  Mobility Specialist Start Time (ACUTE ONLY) [id number]  Mobility Specialist Stop Time (ACUTE ONLY) 1006  Mobility Specialist Time Calculation (min) (ACUTE ONLY) 27 min   Mobility Specialist: Progress Note  Pre-Mobility:      HR 113, SpO2 93% 6L Post-Mobility:    HR 132, SpO2 96% 6L  Pt agreeable to mobility session -requesting transfer to eat breakfast - received in bed. Pt was asymptomatic throughout session with no complaints. Pt with tremors throughout. Pt with stool on bottom, peri care completed with assistance. Returned to chair with all needs met - call bell within reach. Chair Alarm On.    Pre-Mobility:      HR 101, SpO2 93% 6L During Mobility:               SPO2  80-93% 6L Post-Mobility:    HR 108, SpO2 96% 6L  Pt agreeable to mobility session  - received in bed. Pt c/o SOB and BLE weakness requiring two standing breaks for SPO2 recovery. Returned to bed with all needs met - call bell within reach. Bed Alarm On. Wife Present.    Jerry [name], BS Mobility Specialist Please contact via SecureChat or  Rehab office at [phone].

## 2024-03-04 NOTE — Progress Notes (Signed)
 PROGRESS NOTE    Jerry [name].  WUJ:[id number] DOB: 02-07-1941 DOA: 03/01/2024 PCP: Yolanda Hence, MD    Chief Complaint  Patient presents with   Shortness of Breath    Brief Narrative:  Jerry [name]. is a 83 y.o. male with medical history significant of asthma/COPD, hypertension, history of bladder cancer, nonobstructive CAD medically managed, dementia, dyslipidemia, hypertension, sleep apnea not on CPAP, CKD 3B, recent DVT with bilateral PE status post IVC filter.  Recent GI bleeding presumed secondary to known history of gastric and colon AVMs, type 2 diabetes mellitus diet-controlled with hemoglobin A1c during [name] admission 6.2.  Recent discharge home on O2 at 3 L/min.  Known history of asthma/COPD.  - Patient presents to ED secondary to complaints of shortness of breath, his workup was significant for increased oxygen, he is on 7 L at rest   Assessment & Plan:   Principal Problem:   Acute on chronic hypoxic respiratory failure (HCC) Active Problems:   Multiple subsegmental pulmonary emboli without acute cor pulmonale (HCC)  Acute on chronic respiratory failure secondary to: COPD/asthma exacerbation Acute right heart failure exacerbation Pulmonary hypertension -PE presents with worsening dyspnea, up from 3 L to to 7 L oxygen -Was encouraged with incentive spirometry, flutter valve, in the setting of some atelectasis and mucous plugging - Known history of pulmonary hypertension, diagnosed with sleep apnea in the past but unable to tolerate,, I have discussed with patient and wife that he needs to have sleep study as an outpatient to start back on CPAP -recent diagnosis of PE, on anticoagulation due to acute blood loss anemia from GI bleed, status post IVC filter, unclear if respiratory failure due to increased clot burden, cannot perform CTA PE protocol given AKI, so limited ultrasound has been obtained with evidence of increased strain on the right heart. -Continue  with IV Lasix , improving, renal function stable, continue with current dose, cardiology assistance appreciated -Will hold on antibiotics given negative procalcitonin -Repeat 2D echo showing severely elevated PA systolic pressure, D-shaped septum suggestive of RV volume overload, worsening RV function, concern for worsening PE burden, please see discussion below.   Recent DVT/PE -Anticoagulation discontinued during previous admission due to GI bleeding symptoms; IVC placed -Significant concern that increased right heart strain is related to his DVT and PE increased clot burden, will challenge again with heparin  drip no bolus and on the lower therapeutic range, continue to monitor closely.   Elevated cardiac enzymes/known nonobstructive CAD Suspect secondary to demand ischemia from worsening hypoxemia and heart failure physiology Have asked for an additional set of cardiac enzymes noting previous mild increase from 23 to 42 EKG unremarkable and does not have any findings consistent with acute ischemia or infarct On as needed beta-blocker and statin [name] to admission   Sleep apnea not on CPAP Continue oxygen as above, he had is not using CPAP at home as he could not tolerate in the past, discussed with her to follow with PCP to do another sleep study.   Recent GI bleeding presumed secondary to known history of gastric and small bowel AVMs Anticoagulation on hold in favor of IVC filter    Diet-controlled diabetes mellitus 2 Hemoglobin A1c was 6.2 during previous admission Follow CBGs and provide SSI Carb modified diet   Hypertension Current blood pressure reading suboptimal will hold home Norvasc  and as needed Lopressor    Dementia Continue preadmission as needed Xanax , Namenda , and Zyprexa  Delirium precaution    Physical deconditioning PT OT consulted  DVT prophylaxis: Subcutaneous heparin >> heparin  GTT Code Status: Full code Family Communication: discussed with wife wife at  bedside Disposition:   Status is: Inpatient    Consultants:  Cardiology   Subjective:  He had a good night sleep, reports some cough due to postnasal drip  Objective: Vitals:   03/04/24 0500 03/04/24 0759 03/04/24 0823 03/04/24 0849  BP:  (!) 151/84  (!) 144/78  Pulse:  (!) 102  (!) 102  Resp:  (!) 24  (!) 25  Temp:  97.9 F (36.6 C)  98.9 F (37.2 C)  TempSrc:  Temporal  Oral  SpO2:  92% 98% 95%  Weight: 83.8 kg     Height:        Intake/Output Summary (Last 24 hours) at 03/04/2024 1257 Last data filed at 03/04/2024 0102 Gross per 24 hour  Intake --  Output 2200 ml  Net -2200 ml   Filed Weights   03/01/24 2233 03/03/24 0411 03/04/24 0500  Weight: 85 kg 83.9 kg 83.8 kg    Examination:  Awake Alert, Oriented X 3,frail Symmetrical Chest wall movement, crackles at the bases bilaterally RRR,No Gallops,Rubs or new Murmurs, No Parasternal Heave +ve B.Sounds, Abd Soft, No tenderness, No rebound - guarding or rigidity. No Cyanosis, Clubbing, +1 edema, No new Rash or bruise       Data Reviewed: I have personally reviewed following labs and imaging studies  CBC: Recent Labs  Lab 03/01/24 2322 03/02/24 0026 03/03/24 0444 03/03/24 2212 03/04/24 0802  WBC 11.2* 11.1* 9.8 11.1* 10.9*  NEUTROABS 7.1 7.0  --   --   --   HGB 9.3* 8.8* 8.3* 8.5* 8.7*  HCT 34.0* 31.3* 29.1* 28.9* 29.6*  MCV 90.9 91.0 86.1 83.5 83.1  PLT 226 239 217 212 223    Basic Metabolic Panel: Recent Labs  Lab 03/01/24 2322 03/02/24 0026 03/03/24 0444 03/04/24 0802  NA 141 139 142 140  K 4.7 5.0 5.2* 4.4  CL 108 108 108 109  CO2 24 23 25 24   GLUCOSE 139* 129* 155* 94  BUN 22 22 30* 27*  CREATININE 2.64* 2.50* 2.27* 1.94*  CALCIUM  8.9 8.7* 8.7* 9.2  MG 1.6*  --   --   --   PHOS 2.9  --   --   --     GFR: Estimated Creatinine Clearance: 32.3 mL/min (A) (by C-G formula based on SCr of 1.94 mg/dL (H)).  Liver Function Tests: Recent Labs  Lab 03/01/24 2322 03/03/24 0444  AST  22 17  ALT 18 15  ALKPHOS 61 55  BILITOT 0.5 0.4  PROT 6.1* 5.3*  ALBUMIN 3.1* 2.6*    CBG: Recent Labs  Lab 03/03/24 1111 03/03/24 1629 03/03/24 2131 03/04/24 0847 03/04/24 1151  GLUCAP 99 71 107* 87 142*     Recent Results (from the past 240 hours)  Resp panel by RT-PCR (RSV, Flu A&B, Covid) Anterior Nasal Swab     Status: None   Collection Time: 03/01/24 10:50 PM   Specimen: Anterior Nasal Swab  Result Value Ref Range Status   SARS Coronavirus 2 by RT PCR NEGATIVE NEGATIVE Final   Influenza A by PCR NEGATIVE NEGATIVE Final   Influenza B by PCR NEGATIVE NEGATIVE Final    Comment: (NOTE) The Xpert Xpress SARS-CoV-2/FLU/RSV plus assay is intended as an aid in the diagnosis of influenza from Nasopharyngeal swab specimens and should not be used as a sole basis for treatment. Nasal washings and aspirates are unacceptable for Xpert Xpress SARS-CoV-2/FLU/RSV testing.  Fact Sheet for Patients: BloggerCourse.com  Fact Sheet for Healthcare Providers: SeriousBroker.it  This test is not yet approved or cleared by the [place]  FDA and has been authorized for detection and/or diagnosis of SARS-CoV-2 by FDA under an Emergency Use Authorization (EUA). This EUA will remain in effect (meaning this test can be used) for the duration of the COVID-19 declaration under Section 564(b)(1) of the Act, 21 U.S.C. section 360bbb-3(b)(1), unless the authorization is terminated or revoked.     Resp Syncytial Virus by PCR NEGATIVE NEGATIVE Final    Comment: (NOTE) Fact Sheet for Patients: BloggerCourse.com  Fact Sheet for Healthcare Providers: SeriousBroker.it  This test is not yet approved or cleared by the [place]  FDA and has been authorized for detection and/or diagnosis of SARS-CoV-2 by FDA under an Emergency Use Authorization (EUA). This EUA will remain in effect (meaning  this test can be used) for the duration of the COVID-19 declaration under Section 564(b)(1) of the Act, 21 U.S.C. section 360bbb-3(b)(1), unless the authorization is terminated or revoked.  Performed at [facility], [address], [place], [place] [address]          Radiology Studies: ECHOCARDIOGRAM LIMITED Result Date: 03/03/2024    ECHOCARDIOGRAM LIMITED REPORT   Patient Name:   Jerry [name]. Date of Exam: 03/03/2024 Medical Rec #:  [id number]         Height:       71.0 in Accession #:    [id number]        Weight:       185.0 lb Date of Birth:  [birth year]          BSA:          2.040 m Patient Age:    82 years          BP:           135/79 mmHg Patient Gender: M                 HR:           96 bpm. Exam Location:  Inpatient Procedure: Limited Echo, Cardiac Doppler and Color Doppler (Both Spectral and            Color Flow Doppler were utilized during procedure). Indications:    Dyspnea R06.00  History:        Patient has [name] history of Echocardiogram examinations, most                 recent 02/22/2024. Signs/Symptoms:Dyspnea and Shortness of                 Breath; Risk Factors:Hypertension.  Sonographer:    Astrid Blamer Referring Phys: 34 Rito Lecomte S Asees Manfredi IMPRESSIONS  1. Left ventricular ejection fraction, by estimation, is 55 to 60%. The left ventricle has normal function. The left ventricle has no regional wall motion abnormalities. There is mild concentric left ventricular hypertrophy. Left ventricular diastolic parameters are indeterminate.  2. D-shaped septum suggestive of RV pressure/volume overload. Right ventricular systolic function is moderately reduced. The right ventricular size is moderately enlarged. There is severely elevated pulmonary artery systolic pressure. The estimated right ventricular systolic pressure is 68.5 mmHg.  3. The mitral valve was not assessed.  4. The inferior vena cava is normal in size with <50% respiratory variability, suggesting right atrial  pressure of 8 mmHg.  5. A small pericardial effusion is present. The pericardial effusion is localized near the right ventricle.  6. Limited  echo FINDINGS  Left Ventricle: Left ventricular ejection fraction, by estimation, is 55 to 60%. The left ventricle has normal function. The left ventricle has no regional wall motion abnormalities. The left ventricular internal cavity size was normal in size. There is  mild concentric left ventricular hypertrophy. Left ventricular diastolic parameters are indeterminate. Right Ventricle: D-shaped septum suggestive of RV pressure/volume overload. The right ventricular size is moderately enlarged. Right ventricular systolic function is moderately reduced. There is severely elevated pulmonary artery systolic pressure. The tricuspid regurgitant velocity is 3.89 m/s, and with an assumed right atrial pressure of 8 mmHg, the estimated right ventricular systolic pressure is 68.5 mmHg. Pericardium: A small pericardial effusion is present. The pericardial effusion is localized near the right ventricle. Mitral Valve: The mitral valve was not assessed. Tricuspid Valve: The tricuspid valve is normal in structure. Tricuspid valve regurgitation is mild. Aorta: The aortic root is normal in size and structure. Venous: The inferior vena cava is normal in size with less than 50% respiratory variability, suggesting right atrial pressure of 8 mmHg. LEFT VENTRICLE PLAX 2D LVIDd:         3.40 cm LVIDs:         1.80 cm LV PW:         1.60 cm LV IVS:        1.30 cm LVOT diam:     2.00 cm LVOT Area:     3.14 cm  RIGHT VENTRICLE             IVC RV Basal diam:  4.10 cm     IVC diam: 1.90 cm RV Mid diam:    3.60 cm RV S prime:     12.90 cm/s TAPSE (M-mode): 1.5 cm  AORTA Ao Root diam: 3.00 cm TRICUSPID VALVE TR Peak grad:   60.5 mmHg TR Vmax:        389.00 cm/s  SHUNTS Systemic Diam: 2.00 cm Dalton McleanMD Electronically signed by Archer Bear Signature Date/Time: 03/03/2024/10:55:41 AM    Final          Scheduled Meds:  amLODipine   10 mg Oral Daily   atorvastatin   20 mg Oral Daily   budesonide  (PULMICORT ) nebulizer solution  0.25 mg Nebulization BID   cyanocobalamin   250 mcg Oral Daily   escitalopram   10 mg Oral Daily   furosemide   40 mg Intravenous Q12H   guaiFENesin   5 mL Oral Q6H   insulin  aspart  0-6 Units Subcutaneous TID WC   ipratropium-albuterol   3 mL Nebulization BID   memantine   10 mg Oral BID   pantoprazole  (PROTONIX ) IV  40 mg Intravenous Q12H   sodium chloride  flush  3 mL Intravenous Q12H   Continuous Infusions:  heparin  950 Units/hr (03/04/24 0102)     LOS: 2 days        Seena Dadds, MD [facility]   To contact the attending provider between 7A-7P or the covering provider during after hours 7P-7A, please log into the web site [email or web address] and access using universal [place] password for that web site. If you do not have the password, please call the hospital operator.  03/04/2024, 12:57 PM

## 2024-03-04 NOTE — Plan of Care (Signed)

## 2024-03-04 NOTE — Progress Notes (Signed)
 PHARMACY - ANTICOAGULATION CONSULT NOTE  Pharmacy Consult for Heparin  Indication:  recent PE and LLE DVT (02/15/24)  No Known Allergies  Patient Measurements: Height: 5\' 11"  (180.3 cm) Weight: 83.8 kg (184 lb 11.9 oz) IBW/kg (Calculated) : 75.3 HEPARIN  DW (KG): 85  Vital Signs: Temp: 98.9 F (37.2 C) (04/22 0849) Temp Source: Oral (04/22 0849) BP: 144/78 (04/22 0849) Pulse Rate: 102 (04/22 0849)  Labs: Recent Labs    03/02/24 0026 03/02/24 0159 03/02/24 0900 03/02/24 1139 03/03/24 0444 03/03/24 2212 03/04/24 0015 03/04/24 0802 03/04/24 1140  HGB 8.8*  --   --   --  8.3* 8.5*  --  8.7*  --   HCT 31.3*  --   --   --  29.1* 28.9*  --  29.6*  --   PLT 239  --   --   --  217 212  --  223  --   HEPARINUNFRC  --   --   --   --   --   --  <0.10*  --  <0.10*  CREATININE 2.50*  --   --   --  2.27*  --   --  1.94*  --   TROPONINIHS 23* 42* 55* 42*  --   --   --   --   --     Estimated Creatinine Clearance: 32.3 mL/min (A) (by C-G formula based on SCr of 1.94 mg/dL (H)).  Assessment: 83 yr old male with recent PE and LLE DVT (02/15/24) treated with IV heparin  then Eliquis .  Readmitted 02/22/24 with GI bleeding, presumed due to hx gastric and small bowel AVMs.  Anticoagulation discontinued and IVC filter placed 02/22/24.  Admitted 03/02/24 with shortness of breath. Concern for clot burden contributing to worsening pulmonary status and to begin IV heparin  conservatively and without bolus, low therapeutic target levels.  SQ heparin  x 1 given at 1436 on 4/21. No known bleeding since arrival.  On Protonix  40 mg IV q12hrs.  Heparin  level remains undetectable (< 0.10) after infusion rate increased from 850 to 950 units/hr overnight. IV site changed from R antecubital to R forearm site this morning.  Uncertain of timing in relation to blood draw, but no known pump/infusion issues while infusing via AC site. Hemoglobin low stable, platelet count stable.  No bleeding reported.  Goal of Therapy:   Heparin  level 0.3-0.5 units/ml Monitor platelets by anticoagulation protocol: Yes   Plan:  Conservative heparin  infusion rate increase to 1050 units/hr Heparin  level ~8 hrs after rate change. Daily heparin  level and CBC. Monitor for signs/symptoms of bleeding.  Adolphus Akin, RPh 03/04/2024,1:36 PM

## 2024-03-04 NOTE — Progress Notes (Signed)
 PHARMACY - ANTICOAGULATION Pharmacy Consult for Heparin  Indication:  recent PE and LLE DVT  Brief A/P: Heparin  level subtherapeutic Increase Heparin  rate   No Known Allergies  Patient Measurements: Height: 5\' 11"  (180.3 cm) Weight: 83.9 kg (184 lb 15.5 oz) IBW/kg (Calculated) : 75.3 HEPARIN  DW (KG): 85  Vital Signs: Temp: 98.5 F (36.9 C) (04/21 1934) Temp Source: Oral (04/21 1934) BP: 125/72 (04/21 1934) Pulse Rate: 85 (04/21 1934)  Labs: Recent Labs    03/01/24 2322 03/01/24 2322 03/02/24 0026 03/02/24 0159 03/02/24 0900 03/02/24 1139 03/03/24 0444 03/03/24 2212 03/04/24 0015  HGB 9.3*  --  8.8*  --   --   --  8.3* 8.5*  --   HCT 34.0*  --  31.3*  --   --   --  29.1* 28.9*  --   PLT 226  --  239  --   --   --  217 212  --   HEPARINUNFRC  --   --   --   --   --   --   --   --  <0.10*  CREATININE 2.64*  --  2.50*  --   --   --  2.27*  --   --   TROPONINIHS  --    < > 23* 42* 55* 42*  --   --   --    < > = values in this interval not displayed.    Estimated Creatinine Clearance: 27.6 mL/min (A) (by C-G formula based on SCr of 2.27 mg/dL (H)).  Assessment: 83 y.o. male with recent PE/DVT and GIB s/p IVC filter for heparin   Goal of Therapy:  Heparin  level 0.3-0.5 units/ml Monitor platelets by anticoagulation protocol: Yes   Plan:  Increase Heparin  950 units/hr Check heparin  level in 8 hours.   Carlota Chestnut, RPh 03/04/2024,12:42 AM

## 2024-03-04 NOTE — Progress Notes (Signed)
 PHARMACY - ANTICOAGULATION CONSULT NOTE  Pharmacy Consult for Heparin  Indication:  recent PE and LLE DVT (02/15/24)  No Known Allergies  Patient Measurements: Height: 5\' 11"  (180.3 cm) Weight: 83.8 kg (184 lb 11.9 oz) IBW/kg (Calculated) : 75.3 HEPARIN  DW (KG): 85  Vital Signs: Temp: 98.5 F (36.9 C) (04/22 1952) Temp Source: Oral (04/22 1952) BP: 132/82 (04/22 1952) Pulse Rate: 97 (04/22 1952)  Labs: Recent Labs    03/02/24 0026 03/02/24 0159 03/02/24 0900 03/02/24 1139 03/03/24 0444 03/03/24 2212 03/04/24 0015 03/04/24 0802 03/04/24 1140 03/04/24 2057  HGB 8.8*  --   --   --  8.3* 8.5*  --  8.7*  --   --   HCT 31.3*  --   --   --  29.1* 28.9*  --  29.6*  --   --   PLT 239  --   --   --  217 212  --  223  --   --   HEPARINUNFRC  --   --   --   --   --   --  <0.10*  --  <0.10* 0.49  CREATININE 2.50*  --   --   --  2.27*  --   --  1.94*  --   --   TROPONINIHS 23* 42* 55* 42*  --   --   --   --   --   --     Estimated Creatinine Clearance: 32.3 mL/min (A) (by C-G formula based on SCr of 1.94 mg/dL (H)).  Assessment: 83 yr old male with recent PE and LLE DVT (02/15/24) treated with IV heparin  then Eliquis .  Readmitted 02/22/24 with GI bleeding, presumed due to hx gastric and small bowel AVMs.  Anticoagulation discontinued and IVC filter placed 02/22/24.  Admitted 03/02/24 with shortness of breath. Concern for clot burden contributing to worsening pulmonary status and to begin IV heparin  conservatively and without bolus, low therapeutic target levels.  SQ heparin  x 1 given at 1436 on 4/21. No known bleeding since arrival.  On Protonix  40 mg IV q12hrs.  Heparin  level remains undetectable (< 0.10) after infusion rate increased from 850 to 950 units/hr overnight. IV site changed from R antecubital to R forearm site this morning.  Uncertain of timing in relation to blood draw, but no known pump/infusion issues while infusing via AC site. Hemoglobin low stable, platelet count stable.   No bleeding reported.  4/22 pm: heparin  level is therapeutic at 0.49 on 1050 units/hr. No bleeding noted.   Goal of Therapy:  Heparin  level 0.3-0.5 units/ml Monitor platelets by anticoagulation protocol: Yes   Plan:  Continue heparin  infusion at 1050 units/hr Heparin  level with am labs Daily heparin  level and CBC Monitor for signs/symptoms of bleeding  Thank you for involving pharmacy in this patient's care.  Caroline Cinnamon, PharmD, BCPS Clinical Pharmacist Clinical phone for 03/04/2024 is [phone] 03/04/2024 9:40 PM

## 2024-03-04 NOTE — Progress Notes (Signed)
 Patient Name: Lilburn Straw. Date of Encounter: 03/04/2024 [facility] Cardiologist: Cody Das, MD  Interval Summary  .    Patient continues to feel short of breath this AM and is on 6 L supplemental oxygen via [place]. He denies chest pain. Does have lower extremity edema. Reports that he did not sleep very much overnight because he was concerned about his wife being home alone. His wife is coming to visit him later today   Vital Signs .    Vitals:   03/04/24 0500 03/04/24 0759 03/04/24 0823 03/04/24 0849  BP:  (!) 151/84  (!) 144/78  Pulse:  (!) 102  (!) 102  Resp:  (!) 24  (!) 25  Temp:  97.9 F (36.6 C)  98.9 F (37.2 C)  TempSrc:  Temporal  Oral  SpO2:  92% 98% 95%  Weight: 83.8 kg     Height:        Intake/Output Summary (Last 24 hours) at 03/04/2024 0919 Last data filed at 03/04/2024 0102 Gross per 24 hour  Intake --  Output 2200 ml  Net -2200 ml      03/04/2024    5:00 AM 03/03/2024    4:11 AM 03/01/2024   10:33 PM  Last 3 Weights  Weight (lbs) 184 lb 11.9 oz 184 lb 15.5 oz 187 lb 6.3 oz  Weight (kg) 83.8 kg 83.9 kg 85 kg      Telemetry/ECG    Sinus tachycardia, HR in the 90s-100s - Personally Reviewed  Physical Exam .   GEN: No acute distress.  Sitting upright in the bed  Neck: + JVD Cardiac:  RRR, no murmurs, rubs, or gallops.  Respiratory: Crackles in bilateral lung bases. Normal WOB on 6 L via [place]  GI: Soft, nontender, non-distended  MS: 1+ edema in bilateral feet   Assessment & Plan .     Acute on chronic respiratory failure  Pulmonary HTN with right heart failure  Recent PE  - Patient admitted for shortness of breath on/4/25, pulmonary CTA showed bilateral lower lobe segmental PE.  Treated with IV heparin  and discharged on Eliquis .  On/10/25, he was admitted with GI hemorrhage, required 4 units packed red blood cells.  Had an IVC filter placed on 4/11 and anticoagulation was stopped.  Echocardiogram during that admission showed  EF 70-75%, low normal RV systolic function. - Patient represented on/19/25 with worsening shortness of breath, increased oxygen requirement (up to 7 L at rest).  On exam, he had JVD, Rales, bilateral lower extremity edema.  BNP elevated to 111. - Echocardiogram this admission showed EF 55-60%, D-shaped septum suggestive of RV pressure/volume overload, moderately reduced RV systolic function, severely elevated PA systolic pressure - With worsening RV function, severely elevated PA systolic pressure, there is concern for worsening PE burden.  Unable to obtain CT PE due to renal function.  He is now being treated with empiric IV heparin  - Hypervolemia being managed with IV Lasix .  He has been receiving IV Lasix  40 mg twice daily.  Output 2.2 L urine yesterday, currently net -4.4 L since admission. Continues to be a bit hypervolemic on exam today  - Creatinine initially elevated to 2.64 on arrival.  This has improved to 1.94 with diuresis.  Suspect cardiorenal - Continue IV lasix  40 mg BID today  Mild nonobstructive CAD  Elevated Troponin  - Cardiac catheterization in 01/2024 showed minimal, nonobstructive CAD - High-sensitivity troponin 23, 42, 55, 42 - Denies chest pain  -  Troponin trend not consistent with ACS.  Suspect demand ischemia due to volume overload and AKI - No plans for ischemic evaluation at this time - Continue lipitor 20 mg daily   HTN  - BP has been elevated, in the 140s-150s/80s - Continue amlodipine  10 mg daily and IV lasix  as above  - Plan to resume home BB when closer to euvolemic   Otherwise per primary  - AKI on CKD stage IIIb - improved with diuresis  - COPD/asthma  - Recent GI bleed, presumed secondary to known history of gastric and small bowel AVMs- hemoglobin stable on IV heparin   - Type 2 DM  - Dementia   For questions or updates, please contact [facility] Please consult [email or web address] for contact info under        Signed, Debria Fang,  PA-C

## 2024-03-05 ENCOUNTER — Inpatient Hospital Stay (HOSPITAL_COMMUNITY)

## 2024-03-05 DIAGNOSIS — M7989 Other specified soft tissue disorders: Secondary | ICD-10-CM | POA: Diagnosis not present

## 2024-03-05 DIAGNOSIS — F0390 Unspecified dementia without behavioral disturbance: Secondary | ICD-10-CM

## 2024-03-05 DIAGNOSIS — J9621 Acute and chronic respiratory failure with hypoxia: Secondary | ICD-10-CM | POA: Diagnosis not present

## 2024-03-05 DIAGNOSIS — I2694 Multiple subsegmental pulmonary emboli without acute cor pulmonale: Secondary | ICD-10-CM

## 2024-03-05 DIAGNOSIS — I8290 Acute embolism and thrombosis of unspecified vein: Secondary | ICD-10-CM

## 2024-03-05 DIAGNOSIS — I289 Disease of pulmonary vessels, unspecified: Secondary | ICD-10-CM

## 2024-03-05 LAB — BASIC METABOLIC PANEL WITH GFR
Anion gap: 8 (ref 5–15)
BUN: 28 mg/dL — ABNORMAL HIGH (ref 8–23)
CO2: 28 mmol/L (ref 22–32)
Calcium: 9 mg/dL (ref 8.9–10.3)
Chloride: 107 mmol/L (ref 98–111)
Creatinine, Ser: 2.13 mg/dL — ABNORMAL HIGH (ref 0.61–1.24)
GFR, Estimated: 31 mL/min — ABNORMAL LOW (ref >60)
Glucose, Bld: 119 mg/dL — ABNORMAL HIGH (ref 70–99)
Potassium: 4.6 mmol/L (ref 3.5–5.1)
Sodium: 143 mmol/L (ref 135–145)

## 2024-03-05 LAB — CBC
HCT: 27.9 % — ABNORMAL LOW (ref 39.0–52.0)
Hemoglobin: 8.1 g/dL — ABNORMAL LOW (ref 13.0–17.0)
MCH: 24.3 pg — ABNORMAL LOW (ref 26.0–34.0)
MCHC: 29 g/dL — ABNORMAL LOW (ref 30.0–36.0)
MCV: 83.5 fL (ref 80.0–100.0)
Platelets: 216 10*3/uL (ref 150–400)
RBC: 3.34 MIL/uL — ABNORMAL LOW (ref 4.22–5.81)
RDW: 18.8 % — ABNORMAL HIGH (ref 11.5–15.5)
WBC: 10.1 10*3/uL (ref 4.0–10.5)
nRBC: 0.2 % (ref 0.0–0.2)

## 2024-03-05 LAB — GLUCOSE, CAPILLARY
Glucose-Capillary: 97 mg/dL (ref 70–99)
Glucose-Capillary: 148 mg/dL — ABNORMAL HIGH (ref 70–99)
Glucose-Capillary: 124 mg/dL — ABNORMAL HIGH (ref 70–99)
Glucose-Capillary: 126 mg/dL — ABNORMAL HIGH (ref 70–99)

## 2024-03-05 LAB — HEPARIN LEVEL (UNFRACTIONATED): Heparin Unfractionated: 0.49 [IU]/mL (ref 0.30–0.70)

## 2024-03-05 MED ORDER — FUROSEMIDE 10 MG/ML IJ SOLN
40.0000 mg | Freq: Every day | INTRAMUSCULAR | Status: AC
  Administered 2024-03-06: 40 mg via INTRAVENOUS
  Filled 2024-03-05: qty 4

## 2024-03-05 NOTE — Plan of Care (Signed)

## 2024-03-05 NOTE — Progress Notes (Signed)
 SATURATION QUALIFICATIONS: (This note is used to comply with regulatory documentation for home oxygen)  Patient Saturations on Room Air at Rest = N/A  Patient Saturations on Room Air while Ambulating = N/A  Patient Saturations on 4 Liters of oxygen while Ambulating = 84%  Patient Saturations on 6 Liters of oxygen while Ambulating = 88%  Please briefly explain why patient needs home oxygen: Pt hypoxic on <6L/min O2 via HF [place] with exertional tasks (standing/ambulation).

## 2024-03-05 NOTE — Progress Notes (Signed)
 Rounding Note    Patient Name: Jerry [name]. Date of Encounter: 03/05/2024  [facility] Cardiologist: Cody Das, MD   Subjective   Seen sitting up in the chair. Reports breathing is stable at rest, but still with SOB with exertion worse than his baseline  Inpatient Medications    Scheduled Meds:  amLODipine   10 mg Oral Daily   atorvastatin   20 mg Oral Daily   budesonide  (PULMICORT ) nebulizer solution  0.25 mg Nebulization BID   cyanocobalamin   250 mcg Oral Daily   escitalopram   10 mg Oral Daily   furosemide   40 mg Intravenous Q12H   guaiFENesin   5 mL Oral Q6H   insulin  aspart  0-6 Units Subcutaneous TID WC   ipratropium-albuterol   3 mL Nebulization BID   memantine   10 mg Oral BID   pantoprazole  (PROTONIX ) IV  40 mg Intravenous Q12H   sodium chloride  flush  3 mL Intravenous Q12H   Continuous Infusions:  heparin  1,050 Units/hr (03/04/24 2032)   PRN Meds: acetaminophen  **OR** acetaminophen , albuterol , ALPRAZolam , melatonin, OLANZapine , ondansetron  (ZOFRAN ) IV   Vital Signs    Vitals:   03/05/24 0000 03/05/24 0333 03/05/24 0500 03/05/24 0828  BP: (!) 153/67 121/78    Pulse: 95   92  Resp: (!) 26   (!) 23  Temp: 98.2 F (36.8 C) 98.1 F (36.7 C)  98.8 F (37.1 C)  TempSrc: Oral Oral    SpO2: 94%   97%  Weight:   81.9 kg   Height:        Intake/Output Summary (Last 24 hours) at 03/05/2024 1050 Last data filed at 03/05/2024 0500 Gross per 24 hour  Intake --  Output 2000 ml  Net -2000 ml      03/05/2024    5:00 AM 03/04/2024    5:00 AM 03/03/2024    4:11 AM  Last 3 Weights  Weight (lbs) 180 lb 8.9 oz 184 lb 11.9 oz 184 lb 15.5 oz  Weight (kg) 81.9 kg 83.8 kg 83.9 kg      Telemetry    Sinus to sinus tachcyardia with HR 90-100s - Personally Reviewed  ECG    No new tracings - Personally Reviewed  Physical Exam   GEN: No acute distress.   Neck: No JVD Cardiac: RRR, no murmurs, rubs, or gallops.  Respiratory: Clear to  auscultation bilaterally. GI: Soft, nontender, non-distended  MS: No edema; No deformity. Neuro:  Nonfocal  Psych: Normal affect   Labs    High Sensitivity Troponin:   Recent Labs  Lab 02/15/24 0619 03/02/24 0026 03/02/24 0159 03/02/24 0900 03/02/24 1139  TROPONINIHS 22* 23* 42* 55* 42*     Chemistry Recent Labs  Lab 03/01/24 2322 03/02/24 0026 03/03/24 0444 03/04/24 0802 03/05/24 0447  NA 141   < > 142 140 143  K 4.7   < > 5.2* 4.4 4.6  CL 108   < > 108 109 107  CO2 24   < > 25 24 28   GLUCOSE 139*   < > 155* 94 119*  BUN 22   < > 30* 27* 28*  CREATININE 2.64*   < > 2.27* 1.94* 2.13*  CALCIUM  8.9   < > 8.7* 9.2 9.0  MG 1.6*  --   --   --   --   PROT 6.1*  --  5.3*  --   --   ALBUMIN 3.1*  --  2.6*  --   --   AST 22  --  17  --   --   ALT 18  --  15  --   --   ALKPHOS 61  --  55  --   --   BILITOT 0.5  --  0.4  --   --   GFRNONAA 24*   < > 28* 34* 31*  ANIONGAP 9   < > 9 7 8    < > = values in this interval not displayed.    Lipids No results for input(s): "CHOL", "TRIG", "HDL", "LABVLDL", "LDLCALC", "CHOLHDL" in the last 168 hours.  Hematology Recent Labs  Lab 03/03/24 2212 03/04/24 0802 03/05/24 0447  WBC 11.1* 10.9* 10.1  RBC 3.46* 3.56* 3.34*  HGB 8.5* 8.7* 8.1*  HCT 28.9* 29.6* 27.9*  MCV 83.5 83.1 83.5  MCH 24.6* 24.4* 24.3*  MCHC 29.4* 29.4* 29.0*  RDW 18.5* 18.6* 18.8*  PLT 212 223 216   Thyroid  No results for input(s): "TSH", "FREET4" in the last 168 hours.  BNP Recent Labs  Lab 03/02/24 0026  BNP 111.1*    DDimer No results for input(s): "DDIMER" in the last 168 hours.   Radiology    No results found.  Cardiac Studies   Limited echo 03/03/2024 1. Left ventricular ejection fraction, by estimation, is 55 to 60%. The  left ventricle has normal function. The left ventricle has no regional  wall motion abnormalities. There is mild concentric left ventricular  hypertrophy. Left ventricular diastolic  parameters are indeterminate.    2. D-shaped septum suggestive of RV pressure/volume overload. Right  ventricular systolic function is moderately reduced. The right ventricular  size is moderately enlarged. There is severely elevated pulmonary artery  systolic pressure. The estimated  right ventricular systolic pressure is 68.5 mmHg.   3. The mitral valve was not assessed.   4. The inferior vena cava is normal in size with <50% respiratory  variability, suggesting right atrial pressure of 8 mmHg.   5. A small pericardial effusion is present. The pericardial effusion is  localized near the right ventricle.   6. Limited echo   Patient Profile     83 y.o. male with recent right heart failure in the setting of pulmonary emboli now with acute on chronic right heart strain with severe pulmonary hypertension and volume overload. His anticoagulation was stopped due to GI bleeding which ultimately was cauterized. An IVC filter was placed. He now presents with acute on chronic right heart failure which could represent worsening PE burden, now on empiric anticoagulation.   Assessment & Plan    Acute on chronic respiratory failure Pulmonary hypertension with right heart failure Recent PE Acute on chronic kidney disease IIIb - admitted with SOB found to have bilateral PE's treated with IV heparin  and discharged on eliquis  (02/2024) - echo that admission with LVEF 70-75% with low normal RV systolic function - re-admitted for GI bleed with IVC filter placed 02/22/24, eliquis  discontinued - this admission with SPB and acute on chronic respiratory failure with increasing O2 requirements - renal function precludes CTA PE protocol to determine if he has had worsening clot burden - repeat echo this admission with D-shaped septum and moderately reduced RV function, now on empiric IV heparin  - diuresing on 40 mg IV lasix  BID with 2L urine output yesterday - sCr 2.13 (1.94) - baseline probably around 2 - weight is down 7 lbs from admission -  he states still with DOE, likely multifactorial - was only on 20 mg lasix  prior to admission, per notes - could  consider transitioning IV lasix  to torsemide  given RV failure   Mild nonobstructive CAD - mild but flat troponin elevation felt likely demand ischemia - non plans for ischemic evaluation this admission   Hypertension - 10 mg amlodipine  - home lopressor  not yet restarted   Sinus tachycardia - HR in the 100s - likely compensatory      For questions or updates, please contact [facility] Please consult [email or web address] for contact info under        Signed, Lamond Pilot, PA  03/05/2024, 10:50 AM

## 2024-03-05 NOTE — Progress Notes (Signed)
 PHARMACY - ANTICOAGULATION CONSULT NOTE  Pharmacy Consult for Heparin  Indication:  recent PE and LLE DVT (02/15/24)  No Known Allergies  Patient Measurements: Height: 5\' 11"  (180.3 cm) Weight: 81.9 kg (180 lb 8.9 oz) IBW/kg (Calculated) : 75.3 HEPARIN  DW (KG): 85  Vital Signs: Temp: 98.1 F (36.7 C) (04/23 0333) Temp Source: Oral (04/23 0333) BP: 121/78 (04/23 0333) Pulse Rate: 95 (04/23 0000)  Labs: Recent Labs    03/02/24 1139 03/03/24 0444 03/03/24 0444 03/03/24 2212 03/04/24 0015 03/04/24 0802 03/04/24 1140 03/04/24 2057 03/05/24 0447 03/05/24 0807  HGB  --  8.3*   < > 8.5*  --  8.7*  --   --  8.1*  --   HCT  --  29.1*   < > 28.9*  --  29.6*  --   --  27.9*  --   PLT  --  217   < > 212  --  223  --   --  216  --   HEPARINUNFRC  --   --   --   --    < >  --  <0.10* 0.49  --  0.49  CREATININE  --  2.27*  --   --   --  1.94*  --   --  2.13*  --   TROPONINIHS 42*  --   --   --   --   --   --   --   --   --    < > = values in this interval not displayed.    Estimated Creatinine Clearance: 29.5 mL/min (A) (by C-G formula based on SCr of 2.13 mg/dL (H)).  Assessment: 83 yr old male with recent PE and LLE DVT (02/15/24) treated with IV heparin  then Eliquis .  Readmitted 02/22/24 with GI bleeding, presumed due to hx gastric and small bowel AVMs.  Anticoagulation discontinued and IVC filter placed 02/22/24.  Admitted 03/02/24 with shortness of breath. Concern for clot burden contributing to worsening pulmonary status and to begin IV heparin  conservatively and without bolus, low therapeutic target levels.  SQ heparin  x 1 given at 1436 on 4/21. No known bleeding since arrival.  On Protonix  40 mg IV q12hrs.  Heparin  level continues to be therapeutic. Cont current rate.   Goal of Therapy:  Heparin  level 0.3-0.5 units/ml Monitor platelets by anticoagulation protocol: Yes   Plan:  Continue heparin  infusion at 1050 units/hr Daily heparin  level and CBC Monitor for signs/symptoms of  bleeding  Ivery Marking, PharmD, BCIDP, AAHIVP, CPP Infectious Disease Pharmacist 03/05/2024 9:01 AM

## 2024-03-05 NOTE — Progress Notes (Signed)
 Physical Therapy Treatment Patient Details Name: Jerry [name]. MRN: [id number] DOB: [birth year] Today's Date: 03/05/2024   History of Present Illness 83 y.o. male presents to [facility] on 03/02/24 with acute on chronic respiratory failure secondary to COPD/asthma exacerbation and suspected R heart failure exacerbation. 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    PT Comments  Pt received in supine, pleasantly agreeable to therapy session, spouse present and supportive. Pt needing up to CGA for transfers and gait when using RW, he needs minA HHA when standing without support of AD. SpO2 WFL on 6L HF [place] with standing tasks, hypoxic on 4L/min HF [place] with standing but WFL on 4L/min HF[place] when resting/in supine. Pt continues to benefit from PT services to progress toward functional mobility goals, continue to recommend HHPT upon DC as pt making good progress toward his goals and pt appears to have good support from family.    If plan is discharge home, recommend the following: A little help with walking and/or transfers;A little help with bathing/dressing/bathroom;Assistance with cooking/housework;Assist for transportation;Help with stairs or ramp for entrance   Can travel by private vehicle        Equipment Recommendations  None recommended by PT    Recommendations for Other Services       Precautions / Restrictions Precautions Precautions: Fall Recall of Precautions/Restrictions: Impaired Precaution/Restrictions Comments: watch O2 (3 L O2 at baseline) Restrictions Weight Bearing Restrictions Per Provider Order: No     Mobility  Bed Mobility Overal bed mobility: Needs Assistance Bed Mobility: Supine to Sit, Sit to Supine     Supine to sit: Used rails, Contact guard Sit to supine: Used rails, Contact guard assist   General  bed mobility comments: CGA from lowered HOB height, pt using bed rails; assist wtih lines    Transfers Overall transfer level: Needs assistance Equipment used: Rolling walker (2 wheels) Transfers: Sit to/from Stand             General transfer comment: CGA for sit > stand, cues for UE placement and line awareness.    Ambulation/Gait Ambulation/Gait assistance: Contact guard assist, Min assist Gait Distance (Feet): 80 Feet Assistive device: Rolling walker (2 wheels) Gait Pattern/deviations: Step-through pattern, Decreased stride length, Decreased dorsiflexion - right, Decreased dorsiflexion - left Gait velocity: decr Gait velocity interpretation: <1.31 ft/sec, indicative of household ambulator   General Gait Details: minA when not using RW for stability (pt let go to try to fix pants that were sliding down), otherwise CGA. Initial cues for trunk posture and pursed-lip breathing, needs intermittent reminders but fair posture and RW management.   Stairs             Wheelchair Mobility     Tilt Bed    Modified Rankin (Stroke Patients Only)       Balance Overall balance assessment: Needs assistance Sitting-balance support: No upper extremity supported, Feet supported Sitting balance-Leahy Scale: Good     Standing balance support: During functional activity, Bilateral upper extremity supported Standing balance-Leahy Scale: Poor Standing balance comment: reliant on RW or HHA                            Communication Communication Communication: No apparent difficulties  Cognition Arousal: Alert Behavior During Therapy: WFL for tasks assessed/performed   PT - Cognitive impairments: Sequencing, Safety/Judgement  PT - Cognition Comments: some slow processing Following commands: Intact Following commands impaired: Follows one step commands with increased time    Cueing Cueing Techniques: Verbal cues, Gestural cues   Exercises      General Comments General comments (skin integrity, edema, etc.): SpO2 WFL on 4L HF [place] at rest, needing 6L HF [place] to maintain SpO2 88% and above with exertion.      Pertinent Vitals/Pain Pain Assessment Pain Assessment: No/denies pain    Home Living                          Prior Function            PT Goals (current goals can now be found in the care plan section) Acute Rehab PT Goals Patient Stated Goal: to get better PT Goal Formulation: With patient Time For Goal Achievement: 03/17/24 Progress towards PT goals: Progressing toward goals    Frequency    Min 2X/week      PT Plan      Co-evaluation              AM-PAC PT "6 Clicks" Mobility   Outcome Measure  Help needed turning from your back to your side while in a flat bed without using bedrails?: A Little Help needed moving from lying on your back to sitting on the side of a flat bed without using bedrails?: A Little Help needed moving to and from a bed to a chair (including a wheelchair)?: A Little Help needed standing up from a chair using your arms (e.g., wheelchair or bedside chair)?: A Little Help needed to walk in hospital room?: A Little Help needed climbing 3-5 steps with a railing? : Total 6 Click Score: 16    End of Session Equipment Utilized During Treatment: Gait belt;Oxygen Activity Tolerance: Patient tolerated treatment well;Patient limited by fatigue Patient left: in bed;with call bell/phone within reach;with family/visitor present (spouse defers bed alarm until later when she leaves) Nurse Communication: Mobility status;Other (comment) (spouse asking for ostomy RN to help them obtain new bags for urinary bag) PT Visit Diagnosis: Unsteadiness on feet (R26.81);Other abnormalities of gait and mobility (R26.89);Muscle weakness (generalized) (M62.81);Difficulty in walking, not elsewhere classified (R26.2)     Time: 1610-9604 PT Time Calculation (min) (ACUTE ONLY):  27 min  Charges:    $Gait Training: 8-22 mins $Therapeutic Activity: 8-22 mins PT General Charges $$ ACUTE PT VISIT: 1 Visit                     Leanndra Pember P., PTA Acute Rehabilitation Services Secure Chat Preferred 9a-5:30pm Office: [phone]    Mariel Shope [facility] 03/05/2024, 4:47 PM

## 2024-03-05 NOTE — Progress Notes (Signed)
 Occupational Therapy Treatment Patient Details Name: Jerry [name]. MRN: [id number] DOB: 1941/09/22 Today's Date: 03/05/2024   History of present illness 83 y.o. male presents to [facility] on 03/02/24 with acute on chronic respiratory failure secondary to COPD/asthma exacerbation and suspected R heart failure exacerbation. 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   OT comments  Pt received semi-reclined in bed having just called for breakfast.  Pt agreeable to getting out of bed and into recliner in preparation for breakfast.  Pt completed bed mobility Mod I with use of bed rails and HOB elevated.  Pt then completed stand pivot transfer with CGA with use of RW.  OT providing min cues for safety and positioning of body and RW fully in front of chair before attempting to sit.  OT provided education and handouts on energy conservation strategies, providing education on 5P's of energy conservation and providing with additional education to function carryover.  Pt reporting understanding and recognition of a few strategies that he has used previously.  O2 sats dropped to 89% initially upon sitting to EOB, however with cues for breathing pt returned to 92-94 and then remained low 90s during transfer but up to 97% at end of session.  Pt would still benefit from [facility] after discharge.      If plan is discharge home, recommend the following:  A little help with walking and/or transfers;Assistance with cooking/housework;Direct supervision/assist for medications management;Direct supervision/assist for financial management;Assist for transportation;Help with stairs or ramp for entrance;A little help with bathing/dressing/bathroom   Equipment Recommendations  None recommended by OT    Recommendations for Other Services      Precautions /  Restrictions Precautions Precautions: Fall Recall of Precautions/Restrictions: Impaired Precaution/Restrictions Comments: watch O2 (3 L O2 at baseline) Restrictions Weight Bearing Restrictions Per Provider Order: No       Mobility Bed Mobility Overal bed mobility: Needs Assistance Bed Mobility: Supine to Sit     Supine to sit: HOB elevated, Modified independent (Device/Increase time)          Transfers Overall transfer level: Needs assistance Equipment used: Rolling walker (2 wheels) Transfers: Sit to/from Stand Sit to Stand: Contact guard assist     Step pivot transfers: Contact guard assist     General transfer comment: CGA for sit > stand from EOB with improved upright standing posture compared to previous session. Pt with good technique and positioing with use of RW, did benefit from cue from OT to position fully in front of chair before attempting to sit         ADL either performed or assessed with clinical judgement     Communication Communication Communication: No apparent difficulties   Cognition Arousal: Alert Behavior During Therapy: WFL for tasks assessed/performed Cognition: History of cognitive impairments             OT - Cognition Comments: hx of dementia, pleasant, follows directions, fairly accurate PLOF report. education on breathing techniques due to SOB                 Following commands: Intact Following commands impaired: Follows one step commands with increased time                    Pertinent Vitals/ Pain       Pain Assessment Pain Assessment: No/denies pain  Frequency  Min 2X/week        Progress Toward Goals  OT Goals(current goals can now be found in the care plan section)  Progress towards OT goals: Progressing toward goals      AM-PAC OT "6 Clicks" Daily Activity     Outcome Measure   Help from another person eating meals?: None Help from another person taking care of personal  grooming?: A Little Help from another person toileting, which includes using toliet, bedpan, or urinal?: A Little Help from another person bathing (including washing, rinsing, drying)?: A Little Help from another person to put on and taking off regular upper body clothing?: A Little Help from another person to put on and taking off regular lower body clothing?: A Little 6 Click Score: 19    End of Session Equipment Utilized During Treatment: Gait belt;Oxygen  OT Visit Diagnosis: Unsteadiness on feet (R26.81);Other abnormalities of gait and mobility (R26.89);Muscle weakness (generalized) (M62.81);Other symptoms and signs involving cognitive function;Other (comment)   Activity Tolerance Patient tolerated treatment well   Patient Left in chair;with call bell/phone within reach;with chair alarm set   Nurse Communication Mobility status        Time: 4098-1191 OT Time Calculation (min): 28 min  Charges: OT General Charges $OT Visit: 1 Visit OT Treatments $Self Care/Home Management : 23-37 mins    Umair Rosiles, [facility] 03/05/2024, 9:57 AM

## 2024-03-05 NOTE — Care Management Important Message (Signed)
 Important Message  Patient Details  Name: Jerry [name]. MRN: [id number] Date of Birth: 02-13-1941   Important Message Given:  Yes - Medicare IM     Wynonia Hedges 03/05/2024, 4:08 PM

## 2024-03-05 NOTE — Progress Notes (Addendum)
 PROGRESS NOTE        PATIENT DETAILS Name: Jerry [name]. Age: 83 y.o. Sex: male Date of Birth: 1941-07-19 Admit Date: 03/01/2024 Admitting Physician Roxana Copier, DO ZOX:[id number], Jullie Oiler, MD  Brief Summary: Patient is a 83 y.o.  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  Significant events: 4/19>> worsening shortness of breath-acute on chronic hypoxic respiratory failure-admit to [facility].  Significant studies: 4/20>> CT chest: No acute findings. 4/21>> Limited echo: EF 55-60%, RV systolic function moderately reduced-RVSP 68.5.  Significant microbiology data: 4/19>> COVID/influenza/RSV PCR: Negative  Procedures: None  Consults: Cardiology  Subjective: Lying comfortably in bed-denies any chest pain or shortness of breath.  Feels overall better-brown stools yesterday per patient.  Objective: Vitals: Blood pressure 121/78, pulse 95, temperature 98.1 F (36.7 C), temperature source Oral, resp. rate (!) 26, height 5\' 11"  (1.803 m), weight 81.9 kg, SpO2 94%.   Exam: Gen Exam:Alert awake-not in any distress HEENT:atraumatic, normocephalic Chest: B/L clear to auscultation anteriorly CVS:S1S2 regular Abdomen:soft non tender, non distended Extremities:no edema Neurology: Non focal Skin: no rash  Pertinent Labs/Radiology:    Latest Ref Rng & Units 03/05/2024    4:47 AM 03/04/2024     8:02 AM 03/03/2024   10:12 PM  CBC  WBC 4.0 - 10.5 K/uL 10.1  10.9  11.1   Hemoglobin 13.0 - 17.0 g/dL 8.1  8.7  8.5   Hematocrit 39.0 - 52.0 % 27.9  29.6  28.9   Platelets 150 - 400 K/uL 216  223  212     Lab Results  Component Value Date   NA 143 03/05/2024   K 4.6 03/05/2024   CL 107 03/05/2024   CO2 28 03/05/2024      Assessment/Plan: Acute chronic hypoxic respiratory failure secondary to pulmonary hypertension with right heart failure due to presumed pulmonary embolism with worsening clot burden in spite of IVC filter placement on 4/11. Hypoxia slowly improving with IV Lasix  and IV heparin -Down to 5 L of HFNC.  Give underlying CKD-unable to safely do a CT angiogram study. Long-term anticoagulation will be challenging in this patient-has had significant GI bleed requiring blood transfusion after being recently started on anticoagulation for VTE.  Known history of GI AVMs. Given improving hemodynamics/hypoxia-cautiously continuing IV heparin -and attempt to slowly titrate down FiO2.  Difficult situation.  Recent diagnosis of PE/DVT (hospitalized from 4/4-4/7-started on anticoagulation)-but subsequently presented with GI bleeding with acute blood loss anemia-hypovolemic shock requiring hospitalization from 4/10-4/13-required 4 units of PRBC-IVC filter placed-anticoagulation discontinued. Long-term anticoagulation will be challenging-see above.  AKI on CKD stage IIIb  AKI presumably hemodynamically mediated-possibly due to cardiorenal syndrome. Creatinine overall improved and close to baseline at this point Avoid nephrotoxic agents and monitor closely.  Gastrointestinal AVMs No bleeding for the past few days while on IV heparin -but recently hospitalized for GI bleed after being started on anticoagulation for PE/DVT. Maintained on PPI-watch closely-follow CBCs. Difficult situation-see above.  History of nonobstructive CAD No anginal symptoms  HTN BP relatively stable Continue  budipine  Asthma without exacerbation Pulmonary hypertension Chronic hypoxic respiratory failure on 3 L of oxygen at baseline OSA Continue bronchodilators Stable on 3 L of oxygen Not on home CPAP-needs outpatient sleep study.   DM-2 (A1c 6.2 on 2/13) CBGs stable on SSI  Recent Labs    03/04/24 1614 03/04/24 2128 03/05/24 0826  GLUCAP 125* 155* 97      HLD Statin    Dementia Mood disorder Delirium precautions Continue Namenda /olanzapine   5 mm middle lobe nodule Incidental finding on CT chest. Stable per radiology report Continue outpatient monitoring.  BMI: Estimated body mass index is 25.18 kg/m as calculated from the following:   Height as of this encounter: 5\' 11"  (1.803 m).   Weight as of this encounter: 81.9 kg.   Code status:   Code Status: Full Code   DVT Prophylaxis: IV Heparin    Family Communication: None at bedside   Disposition Plan: Status is: Inpatient Remains inpatient appropriate because: Severity of illness   Planned Discharge Destination:Home   Diet: Diet Order             Diet Carb Modified Fluid consistency: Thin; Room service appropriate? Yes  Diet effective now                     Antimicrobial agents: Anti-infectives (From admission, onward)    Start     Dose/Rate Route Frequency Ordered Stop   03/03/24 1000  vancomycin  (VANCOREADY) IVPB 750 mg/150 mL  Status:  Discontinued        750 mg 150 mL/hr over 60 Minutes Intravenous Every 24 hours 03/02/24 0427 03/02/24 0852   03/02/24 1500  ceFEPIme  (MAXIPIME ) 2 g in sodium chloride  0.9 % 100 mL IVPB  Status:  Discontinued        2 g 200 mL/hr over 30 Minutes Intravenous Every 12 hours 03/02/24 0424 03/02/24 0852   03/02/24 0215  vancomycin  (VANCOREADY) IVPB 2000 mg/400 mL        2,000 mg 200 mL/hr over 120 Minutes Intravenous  Once 03/02/24 0212 03/02/24 0856   03/02/24 0215  ceFEPIme  (MAXIPIME ) 2 g in sodium chloride  0.9 % 100 mL IVPB        2 g 200 mL/hr over 30  Minutes Intravenous  Once 03/02/24 0212 03/02/24 0556        MEDICATIONS: Scheduled Meds:  amLODipine   10 mg Oral Daily   atorvastatin   20 mg Oral Daily   budesonide  (PULMICORT ) nebulizer solution  0.25 mg Nebulization BID   cyanocobalamin   250 mcg Oral Daily   escitalopram   10 mg Oral Daily   furosemide   40 mg Intravenous Q12H   guaiFENesin   5 mL Oral Q6H   insulin  aspart  0-6 Units Subcutaneous TID WC   ipratropium-albuterol   3 mL Nebulization BID   memantine   10 mg Oral BID   pantoprazole  (PROTONIX ) IV  40 mg Intravenous Q12H   sodium chloride  flush  3 mL Intravenous Q12H   Continuous Infusions:  heparin  1,050 Units/hr (03/04/24 2032)   PRN Meds:.acetaminophen  **OR** acetaminophen , albuterol , ALPRAZolam , melatonin, OLANZapine , ondansetron  (ZOFRAN ) IV   I have personally reviewed following labs and imaging studies  LABORATORY DATA: CBC: Recent Labs  Lab 03/01/24 2322 03/02/24 0026 03/03/24 0444 03/03/24 2212 03/04/24 0802 03/05/24 0447  WBC 11.2* 11.1* 9.8 11.1* 10.9* 10.1  NEUTROABS 7.1 7.0  --   --   --   --  HGB 9.3* 8.8* 8.3* 8.5* 8.7* 8.1*  HCT 34.0* 31.3* 29.1* 28.9* 29.6* 27.9*  MCV 90.9 91.0 86.1 83.5 83.1 83.5  PLT 226 239 217 212 223 216    Basic Metabolic Panel: Recent Labs  Lab 03/01/24 2322 03/02/24 0026 03/03/24 0444 03/04/24 0802 03/05/24 0447  NA 141 139 142 140 143  K 4.7 5.0 5.2* 4.4 4.6  CL 108 108 108 109 107  CO2 24 23 25 24 28   GLUCOSE 139* 129* 155* 94 119*  BUN 22 22 30* 27* 28*  CREATININE 2.64* 2.50* 2.27* 1.94* 2.13*  CALCIUM  8.9 8.7* 8.7* 9.2 9.0  MG 1.6*  --   --   --   --   PHOS 2.9  --   --   --   --     GFR: Estimated Creatinine Clearance: 29.5 mL/min (A) (by C-G formula based on SCr of 2.13 mg/dL (H)).  Liver Function Tests: Recent Labs  Lab 03/01/24 2322 03/03/24 0444  AST 22 17  ALT 18 15  ALKPHOS 61 55  BILITOT 0.5 0.4  PROT 6.1* 5.3*  ALBUMIN 3.1* 2.6*   No results for input(s): "LIPASE",  "AMYLASE" in the last 168 hours. No results for input(s): "AMMONIA" in the last 168 hours.  Coagulation Profile: No results for input(s): "INR", "PROTIME" in the last 168 hours.  Cardiac Enzymes: No results for input(s): "CKTOTAL", "CKMB", "CKMBINDEX", "TROPONINI" in the last 168 hours.  BNP (last 3 results) Recent Labs    12/27/23 1105  PROBNP 1,952*    Lipid Profile: No results for input(s): "CHOL", "HDL", "LDLCALC", "TRIG", "CHOLHDL", "LDLDIRECT" in the last 72 hours.  Thyroid  Function Tests: No results for input(s): "TSH", "T4TOTAL", "FREET4", "T3FREE", "THYROIDAB" in the last 72 hours.  Anemia Panel: No results for input(s): "VITAMINB12", "FOLATE", "FERRITIN", "TIBC", "IRON ", "RETICCTPCT" in the last 72 hours.  Urine analysis:    Component Value Date/Time   COLORURINE YELLOW 02/23/2024 0635   APPEARANCEUR CLEAR 02/23/2024 0635   LABSPEC 1.009 02/23/2024 0635   PHURINE 6.0 02/23/2024 0635   GLUCOSEU NEGATIVE 02/23/2024 0635   HGBUR NEGATIVE 02/23/2024 0635   BILIRUBINUR NEGATIVE 02/23/2024 0635   KETONESUR NEGATIVE 02/23/2024 0635   PROTEINUR NEGATIVE 02/23/2024 0635   UROBILINOGEN 0.2 04/17/2015 1820   NITRITE NEGATIVE 02/23/2024 0635   LEUKOCYTESUR NEGATIVE 02/23/2024 0635    Sepsis Labs: Lactic Acid, Venous    Component Value Date/Time   LATICACIDVEN 2.5 (HH) 02/22/2024 1546    MICROBIOLOGY: Recent Results (from the past 240 hours)  Resp panel by RT-PCR (RSV, Flu A&B, Covid) Anterior Nasal Swab     Status: None   Collection Time: 03/01/24 10:50 PM   Specimen: Anterior Nasal Swab  Result Value Ref Range Status   SARS Coronavirus 2 by RT PCR NEGATIVE NEGATIVE Final   Influenza A by PCR NEGATIVE NEGATIVE Final   Influenza B by PCR NEGATIVE NEGATIVE Final    Comment: (NOTE) The Xpert Xpress SARS-CoV-2/FLU/RSV plus assay is intended as an aid in the diagnosis of influenza from Nasopharyngeal swab specimens and should not be used as a sole basis for  treatment. Nasal washings and aspirates are unacceptable for Xpert Xpress SARS-CoV-2/FLU/RSV testing.  Fact Sheet for Patients: BloggerCourse.com  Fact Sheet for Healthcare Providers: SeriousBroker.it  This test is not yet approved or cleared by the [place]  FDA and has been authorized for detection and/or diagnosis of SARS-CoV-2 by FDA under an Emergency Use Authorization (EUA). This EUA will remain in effect (meaning this test can  be used) for the duration of the COVID-19 declaration under Section 564(b)(1) of the Act, 21 U.S.C. section 360bbb-3(b)(1), unless the authorization is terminated or revoked.     Resp Syncytial Virus by PCR NEGATIVE NEGATIVE Final    Comment: (NOTE) Fact Sheet for Patients: BloggerCourse.com  Fact Sheet for Healthcare Providers: SeriousBroker.it  This test is not yet approved or cleared by the [place]  FDA and has been authorized for detection and/or diagnosis of SARS-CoV-2 by FDA under an Emergency Use Authorization (EUA). This EUA will remain in effect (meaning this test can be used) for the duration of the COVID-19 declaration under Section 564(b)(1) of the Act, 21 U.S.C. section 360bbb-3(b)(1), unless the authorization is terminated or revoked.  Performed at [facility], [address], [place], [place] [address]     RADIOLOGY STUDIES/RESULTS: ECHOCARDIOGRAM LIMITED Result Date: 03/03/2024    ECHOCARDIOGRAM LIMITED REPORT   Patient Name:   Ethen Bannan. Date of Exam: 03/03/2024 Medical Rec #:  [id number]         Height:       71.0 in Accession #:    [id number]        Weight:       185.0 lb Date of Birth:  06-18-1941          BSA:          2.040 m Patient Age:    82 years          BP:           135/79 mmHg Patient Gender: M                 HR:           96 bpm. Exam Location:  Inpatient Procedure: Limited Echo, Cardiac Doppler  and Color Doppler (Both Spectral and            Color Flow Doppler were utilized during procedure). Indications:    Dyspnea R06.00  History:        Patient has prior history of Echocardiogram examinations, most                 recent 02/22/2024. Signs/Symptoms:Dyspnea and Shortness of                 Breath; Risk Factors:Hypertension.  Sonographer:    Astrid Blamer Referring Phys: [id number] DAWOOD S ELGERGAWY IMPRESSIONS  1. Left ventricular ejection fraction, by estimation, is 55 to 60%. The left ventricle has normal function. The left ventricle has no regional wall motion abnormalities. There is mild concentric left ventricular hypertrophy. Left ventricular diastolic parameters are indeterminate.  2. D-shaped septum suggestive of RV pressure/volume overload. Right ventricular systolic function is moderately reduced. The right ventricular size is moderately enlarged. There is severely elevated pulmonary artery systolic pressure. The estimated right ventricular systolic pressure is 68.5 mmHg.  3. The mitral valve was not assessed.  4. The inferior vena cava is normal in size with <50% respiratory variability, suggesting right atrial pressure of 8 mmHg.  5. A small pericardial effusion is present. The pericardial effusion is localized near the right ventricle.  6. Limited echo FINDINGS  Left Ventricle: Left ventricular ejection fraction, by estimation, is 55 to 60%. The left ventricle has normal function. The left ventricle has no regional wall motion abnormalities. The left ventricular internal cavity size was normal in size. There is  mild concentric left ventricular hypertrophy. Left ventricular diastolic parameters are indeterminate. Right Ventricle: D-shaped septum suggestive of  RV pressure/volume overload. The right ventricular size is moderately enlarged. Right ventricular systolic function is moderately reduced. There is severely elevated pulmonary artery systolic pressure. The tricuspid regurgitant velocity is 3.89  m/s, and with an assumed right atrial pressure of 8 mmHg, the estimated right ventricular systolic pressure is 68.5 mmHg. Pericardium: A small pericardial effusion is present. The pericardial effusion is localized near the right ventricle. Mitral Valve: The mitral valve was not assessed. Tricuspid Valve: The tricuspid valve is normal in structure. Tricuspid valve regurgitation is mild. Aorta: The aortic root is normal in size and structure. Venous: The inferior vena cava is normal in size with less than 50% respiratory variability, suggesting right atrial pressure of 8 mmHg. LEFT VENTRICLE PLAX 2D LVIDd:         3.40 cm LVIDs:         1.80 cm LV PW:         1.60 cm LV IVS:        1.30 cm LVOT diam:     2.00 cm LVOT Area:     3.14 cm  RIGHT VENTRICLE             IVC RV Basal diam:  4.10 cm     IVC diam: 1.90 cm RV Mid diam:    3.60 cm RV S prime:     12.90 cm/s TAPSE (M-mode): 1.5 cm  AORTA Ao Root diam: 3.00 cm TRICUSPID VALVE TR Peak grad:   60.5 mmHg TR Vmax:        389.00 cm/s  SHUNTS Systemic Diam: 2.00 cm Dalton McleanMD Electronically signed by Archer Bear Signature Date/Time: 03/03/2024/10:55:41 AM    Final      LOS: 3 days   Kimberly Penna, MD  [facility]    To contact the attending provider between 7A-7P or the covering provider during after hours 7P-7A, please log into the web site [email or web address] and access using universal [place] password for that web site. If you do not have the password, please call the hospital operator.  03/05/2024, 9:48 AM

## 2024-03-06 DIAGNOSIS — J9621 Acute and chronic respiratory failure with hypoxia: Secondary | ICD-10-CM | POA: Diagnosis not present

## 2024-03-06 DIAGNOSIS — I2694 Multiple subsegmental pulmonary emboli without acute cor pulmonale: Secondary | ICD-10-CM | POA: Diagnosis not present

## 2024-03-06 DIAGNOSIS — F0390 Unspecified dementia without behavioral disturbance: Secondary | ICD-10-CM | POA: Diagnosis not present

## 2024-03-06 DIAGNOSIS — I8290 Acute embolism and thrombosis of unspecified vein: Secondary | ICD-10-CM | POA: Diagnosis not present

## 2024-03-06 LAB — GLUCOSE, CAPILLARY
Glucose-Capillary: 92 mg/dL (ref 70–99)
Glucose-Capillary: 94 mg/dL (ref 70–99)
Glucose-Capillary: 108 mg/dL — ABNORMAL HIGH (ref 70–99)
Glucose-Capillary: 103 mg/dL — ABNORMAL HIGH (ref 70–99)

## 2024-03-06 LAB — BASIC METABOLIC PANEL WITH GFR
Anion gap: 7 (ref 5–15)
BUN: 31 mg/dL — ABNORMAL HIGH (ref 8–23)
CO2: 27 mmol/L (ref 22–32)
Calcium: 8.9 mg/dL (ref 8.9–10.3)
Chloride: 106 mmol/L (ref 98–111)
Creatinine, Ser: 1.88 mg/dL — ABNORMAL HIGH (ref 0.61–1.24)
GFR, Estimated: 36 mL/min — ABNORMAL LOW (ref >60)
Glucose, Bld: 100 mg/dL — ABNORMAL HIGH (ref 70–99)
Potassium: 4.9 mmol/L (ref 3.5–5.1)
Sodium: 140 mmol/L (ref 135–145)

## 2024-03-06 LAB — HEPARIN LEVEL (UNFRACTIONATED)
Heparin Unfractionated: 0.15 [IU]/mL — ABNORMAL LOW (ref 0.30–0.70)
Heparin Unfractionated: 0.36 [IU]/mL (ref 0.30–0.70)

## 2024-03-06 LAB — CBC
HCT: 27.6 % — ABNORMAL LOW (ref 39.0–52.0)
Hemoglobin: 8.3 g/dL — ABNORMAL LOW (ref 13.0–17.0)
MCH: 25 pg — ABNORMAL LOW (ref 26.0–34.0)
MCHC: 30.1 g/dL (ref 30.0–36.0)
MCV: 83.1 fL (ref 80.0–100.0)
Platelets: 251 10*3/uL (ref 150–400)
RBC: 3.32 MIL/uL — ABNORMAL LOW (ref 4.22–5.81)
RDW: 18.6 % — ABNORMAL HIGH (ref 11.5–15.5)
WBC: 10.1 10*3/uL (ref 4.0–10.5)
nRBC: 0 % (ref 0.0–0.2)

## 2024-03-06 MED ORDER — APIXABAN 5 MG PO TABS
5.0000 mg | ORAL_TABLET | Freq: Two times a day (BID) | ORAL | Status: DC
  Administered 2024-03-06 – 2024-03-08 (×5): 5 mg via ORAL
  Filled 2024-03-06 (×4): qty 1
  Filled 2024-03-06: qty 2

## 2024-03-06 MED ORDER — PANTOPRAZOLE SODIUM 40 MG PO TBEC
40.0000 mg | DELAYED_RELEASE_TABLET | Freq: Two times a day (BID) | ORAL | Status: DC
  Administered 2024-03-06 – 2024-03-08 (×5): 40 mg via ORAL
  Filled 2024-03-06 (×6): qty 1

## 2024-03-06 NOTE — Progress Notes (Signed)
   03/06/24 1127  Mobility  Activity Ambulated with assistance in hallway  Level of Assistance Contact guard assist, steadying assist  Assistive Device Front wheel walker  Distance Ambulated (ft) 75 ft  Activity Response Tolerated fair  Mobility Referral Yes  Mobility visit 1 Mobility  Mobility Specialist Start Time (ACUTE ONLY) 1127  Mobility Specialist Stop Time (ACUTE ONLY) 1145  Mobility Specialist Time Calculation (min) (ACUTE ONLY) 18 min   Mobility Specialist: Progress Note  Pre-Mobility:      HR 93, SpO2 98% 3L During Mobility:             SpO2 89-91% 3L Post-Mobility:    HR 99, SpO2 98% 3L  Pt agreeable to mobility session - received in chair. C/o SOB. Three standing breaks required as patients pajama pants were slipping down. Returned to chair with all needs met - call bell within reach.   Jerry [name], BS Mobility Specialist Please contact via SecureChat or  Rehab office at [phone].

## 2024-03-06 NOTE — Progress Notes (Signed)
 PHARMACY - ANTICOAGULATION CONSULT NOTE  Pharmacy Consult for Heparin >>apixaban  Indication:  recent PE and LLE DVT (02/15/24)  No Known Allergies  Patient Measurements: Height: 5\' 11"  (180.3 cm) Weight: 82.3 kg (181 lb 7 oz) IBW/kg (Calculated) : 75.3 HEPARIN  DW (KG): 85  Vital Signs: Temp: 98.7 F (37.1 C) (04/24 0742) Temp Source: Oral (04/23 2338) BP: 127/71 (04/24 0741) Pulse Rate: 95 (04/24 0742)  Labs: Recent Labs    03/04/24 0802 03/04/24 1140 03/04/24 2057 03/05/24 0447 03/05/24 0807 03/06/24 0441  HGB 8.7*  --   --  8.1*  --  8.3*  HCT 29.6*  --   --  27.9*  --  27.6*  PLT 223  --   --  216  --  251  HEPARINUNFRC  --    < > 0.49  --  0.49 0.15*  CREATININE 1.94*  --   --  2.13*  --  1.88*   < > = values in this interval not displayed.    Estimated Creatinine Clearance: 33.4 mL/min (A) (by C-G formula based on SCr of 1.88 mg/dL (H)).  Assessment: 83 yr old male with recent PE and LLE DVT (02/15/24) treated with IV heparin  then Eliquis .  Readmitted 02/22/24 with GI bleeding, presumed due to hx gastric and small bowel AVMs.  Anticoagulation discontinued and IVC filter placed 02/22/24.  Admitted 03/02/24 with shortness of breath. Concern for clot burden contributing to worsening pulmonary status and to begin IV heparin  conservatively and without bolus, low therapeutic target levels.  SQ heparin  x 1 given at 1436 on 4/21. No known bleeding since arrival.  On Protonix  40 mg IV q12hrs.  Ok to transition back to apixaban  today and monitor for any further bleeding.   Hgb 8.3, plt wnl  Goal of Therapy:  Monitor platelets by anticoagulation protocol: Yes   Plan:  Dc heparin  Apixaban  5mg  PO BID Monitor for signs/symptoms of bleeding  Ivery Marking, PharmD, BCIDP, AAHIVP, CPP Infectious Disease Pharmacist 03/06/2024 9:54 AM

## 2024-03-06 NOTE — Progress Notes (Addendum)
 Rounding Note    Patient Name: Jerry [name]. Date of Encounter: 03/06/2024  [facility] Cardiologist: Cody Das, MD   Subjective   Found sitting up in recliner, states breathing is the same  Inpatient Medications    Scheduled Meds:  amLODipine   10 mg Oral Daily   apixaban   5 mg Oral BID   atorvastatin   20 mg Oral Daily   budesonide  (PULMICORT ) nebulizer solution  0.25 mg Nebulization BID   cyanocobalamin   250 mcg Oral Daily   escitalopram   10 mg Oral Daily   furosemide   40 mg Intravenous Daily   guaiFENesin   5 mL Oral Q6H   insulin  aspart  0-6 Units Subcutaneous TID WC   ipratropium-albuterol   3 mL Nebulization BID   memantine   10 mg Oral BID   pantoprazole   40 mg Oral BID   sodium chloride  flush  3 mL Intravenous Q12H   Continuous Infusions:  PRN Meds: acetaminophen  **OR** acetaminophen , albuterol , ALPRAZolam , melatonin, OLANZapine , ondansetron  (ZOFRAN ) IV   Vital Signs    Vitals:   03/06/24 0425 03/06/24 0741 03/06/24 0742 03/06/24 0830  BP:  127/71    Pulse:  93 95   Resp:  (!) 23 (!) 23   Temp:   98.7 F (37.1 C)   TempSrc:      SpO2:  92% 94% 98%  Weight: 82.3 kg     Height:       No intake or output data in the 24 hours ending 03/06/24 1011    03/06/2024    4:25 AM 03/05/2024    5:00 AM 03/04/2024    5:00 AM  Last 3 Weights  Weight (lbs) 181 lb 7 oz 180 lb 8.9 oz 184 lb 11.9 oz  Weight (kg) 82.3 kg 81.9 kg 83.8 kg      Telemetry    Sinus rhythm HR 90s with occasional nonconducted p waves following episodes of inverted p waves - Personally Reviewed  ECG    No new tracings - Personally Reviewed  Physical Exam   GEN: No acute distress.   Neck: No JVD Cardiac: RRR, no murmurs, rubs, or gallops.  Respiratory: lung sounds seem improved GI: Soft, nontender, non-distended  MS: No edema; No deformity. Neuro:  Nonfocal  Psych: Normal affect   Labs    High Sensitivity Troponin:   Recent Labs  Lab 02/15/24 0619  03/02/24 0026 03/02/24 0159 03/02/24 0900 03/02/24 1139  TROPONINIHS 22* 23* 42* 55* 42*     Chemistry Recent Labs  Lab 03/01/24 2322 03/02/24 0026 03/03/24 0444 03/04/24 0802 03/05/24 0447 03/06/24 0441  NA 141   < > 142 140 143 140  K 4.7   < > 5.2* 4.4 4.6 4.9  CL 108   < > 108 109 107 106  CO2 24   < > 25 24 28 27   GLUCOSE 139*   < > 155* 94 119* 100*  BUN 22   < > 30* 27* 28* 31*  CREATININE 2.64*   < > 2.27* 1.94* 2.13* 1.88*  CALCIUM  8.9   < > 8.7* 9.2 9.0 8.9  MG 1.6*  --   --   --   --   --   PROT 6.1*  --  5.3*  --   --   --   ALBUMIN 3.1*  --  2.6*  --   --   --   AST 22  --  17  --   --   --  ALT 18  --  15  --   --   --   ALKPHOS 61  --  55  --   --   --   BILITOT 0.5  --  0.4  --   --   --   GFRNONAA 24*   < > 28* 34* 31* 36*  ANIONGAP 9   < > 9 7 8 7    < > = values in this interval not displayed.    Lipids No results for input(s): "CHOL", "TRIG", "HDL", "LABVLDL", "LDLCALC", "CHOLHDL" in the last 168 hours.  Hematology Recent Labs  Lab 03/04/24 0802 03/05/24 0447 03/06/24 0441  WBC 10.9* 10.1 10.1  RBC 3.56* 3.34* 3.32*  HGB 8.7* 8.1* 8.3*  HCT 29.6* 27.9* 27.6*  MCV 83.1 83.5 83.1  MCH 24.4* 24.3* 25.0*  MCHC 29.4* 29.0* 30.1  RDW 18.6* 18.8* 18.6*  PLT 223 216 251   Thyroid  No results for input(s): "TSH", "FREET4" in the last 168 hours.  BNP Recent Labs  Lab 03/02/24 0026  BNP 111.1*    DDimer No results for input(s): "DDIMER" in the last 168 hours.   Radiology    VAS US  LOWER EXTREMITY VENOUS (DVT) Result Date: 03/05/2024  Lower Venous DVT Study Patient Name:  Jerry [name].  Date of Exam:   03/05/2024 Medical Rec #: [id number]          Accession #:    [id number] Date of Birth: 08-27-1941           Patient Gender: M Patient Age:   27 years Exam Location:  [facility] Procedure:      VAS US  LOWER EXTREMITY VENOUS (DVT) Referring Phys: Kimberly Penna  --------------------------------------------------------------------------------  Indications: Recent DVT - S/P IVC filter with worsening hypoxia-assess for new clot burden.  Comparison Study: 02/22/24 - Positive DVT in left popliteal vein. Performing Technologist: Franky Ivanoff Sturdivant-Jones RDMS, RVT  Examination Guidelines: A complete evaluation includes B-mode imaging, spectral Doppler, color Doppler, and power Doppler as needed of all accessible portions of each vessel. Bilateral testing is considered an integral part of a complete examination. Limited examinations for reoccurring indications may be performed as noted. The reflux portion of the exam is performed with the patient in reverse Trendelenburg.  +---------+---------------+---------+-----------+----------+--------------+ RIGHT    CompressibilityPhasicitySpontaneityPropertiesThrombus Aging +---------+---------------+---------+-----------+----------+--------------+ CFV      Full           Yes      Yes                                 +---------+---------------+---------+-----------+----------+--------------+ SFJ      Full                                                        +---------+---------------+---------+-----------+----------+--------------+ FV Prox  Full                                                        +---------+---------------+---------+-----------+----------+--------------+ FV Mid   Full                                                        +---------+---------------+---------+-----------+----------+--------------+  FV DistalFull                                                        +---------+---------------+---------+-----------+----------+--------------+ PFV      Full                                                        +---------+---------------+---------+-----------+----------+--------------+ POP      Full           Yes      Yes                                  +---------+---------------+---------+-----------+----------+--------------+ PTV      Full                                                        +---------+---------------+---------+-----------+----------+--------------+ PERO     Full                                                        +---------+---------------+---------+-----------+----------+--------------+   +---------+---------------+---------+-----------+----------+--------------+ LEFT     CompressibilityPhasicitySpontaneityPropertiesThrombus Aging +---------+---------------+---------+-----------+----------+--------------+ CFV      Full           Yes      Yes                                 +---------+---------------+---------+-----------+----------+--------------+ SFJ      Full                                                        +---------+---------------+---------+-----------+----------+--------------+ FV Prox  Full                                                        +---------+---------------+---------+-----------+----------+--------------+ FV Mid   Full                                                        +---------+---------------+---------+-----------+----------+--------------+ FV DistalFull                                                        +---------+---------------+---------+-----------+----------+--------------+  PFV      Full                                                        +---------+---------------+---------+-----------+----------+--------------+ POP      Full           Yes      Yes                                 +---------+---------------+---------+-----------+----------+--------------+ PTV      Full                                                        +---------+---------------+---------+-----------+----------+--------------+ PERO     Full                                                         +---------+---------------+---------+-----------+----------+--------------+     Summary: BILATERAL: - No evidence of deep vein thrombosis seen in the lower extremities, bilaterally. -No evidence of popliteal cyst, bilaterally.  LEFT: - Findings suggest resolution of previously noted thrombus.  *See table(s) above for measurements and observations.    Preliminary     Cardiac Studies   Limited echo 03/03/2024 1. Left ventricular ejection fraction, by estimation, is 55 to 60%. The  left ventricle has normal function. The left ventricle has no regional  wall motion abnormalities. There is mild concentric left ventricular  hypertrophy. Left ventricular diastolic  parameters are indeterminate.   2. D-shaped septum suggestive of RV pressure/volume overload. Right  ventricular systolic function is moderately reduced. The right ventricular  size is moderately enlarged. There is severely elevated pulmonary artery  systolic pressure. The estimated  right ventricular systolic pressure is 68.5 mmHg.   3. The mitral valve was not assessed.   4. The inferior vena cava is normal in size with <50% respiratory  variability, suggesting right atrial pressure of 8 mmHg.   5. A small pericardial effusion is present. The pericardial effusion is  localized near the right ventricle.   6. Limited echo   Patient Profile     83 y.o. male with recent right heart failure in the setting of pulmonary emboli now with acute on chronic right heart strain with severe pulmonary hypertension and volume overload. His anticoagulation was stopped due to GI bleeding which ultimately was cauterized. An IVC filter was placed. He now presents with acute on chronic right heart failure which could represent worsening PE burden, now on empiric anticoagulation.   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 - admitted with acute on chronic respiratory failure with  increasing O2 requirements and SOB - renal function precluded CTA PE protocol to determine worsening clot burden, now on empiric IV heparin  - repeat echo with D-shaped septum and moderately reduced RV function - has been diuresing on IV lasix  40 mg BID --> reduced to daily dosing yesterday - sCr down to 1.88 today, baseline probably around 2 - weight is 181 lbs down from 187 lbs at admission - continues to have DOE, likely multifactorial - continue IV diuresis today, may consider torsemide  at discharge, was only on 20 mg lasix  OP   Mild nonobstructive CAD - mild and flat troponin elevation likely demand ischemia - no plans for ischemic evaluation this admission   Hypertension - 10 mg amlodipine , home lopressor  not yet started   Sinus tachycardia - HR in the 90s, likely compensatory - episodes of nonconducted p waves in the setting of flipped p waves - ?wandering pacemaker - not on BB   For questions or updates, please contact [facility] Please consult [email or web address] for contact info under        Signed, Lamond Pilot, PA  03/06/2024, 10:11 AM

## 2024-03-06 NOTE — Plan of Care (Signed)

## 2024-03-06 NOTE — Progress Notes (Signed)
 PROGRESS NOTE        PATIENT DETAILS Name: Jerry [name]. Age: 83 y.o. Sex: male Date of Birth: November 15, 1940 Admit Date: 03/01/2024 Admitting Physician Roxana Copier, DO QMV:HQIONGE, Jullie Oiler, MD  Brief Summary: Patient is a 83 y.o.  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  Significant events: 4/19>> worsening shortness of breath-acute on chronic hypoxic respiratory failure-admit to [facility].  Significant studies: 4/20>> CT chest: No acute findings. 4/21>> Limited echo: EF 55-60%, RV systolic function moderately reduced-RVSP 68.5. 4/23>> B/L lower extremity Doppler: Resolution of previously seen thrombus-no DVT bilaterally.  Significant microbiology data: 4/19>> COVID/influenza/RSV PCR: Negative  Procedures: None  Consults: Cardiology  Subjective: Jerry [name] yesterday-lying flat-Down to 3-4 L of oxygen this morning.  Objective: Vitals: Blood pressure 127/71, pulse 95, temperature 98.7 F (37.1 C), resp. rate (!) 23, height 5\' 11"  (1.803 m), weight 82.3 kg, SpO2 98%.   Exam: Gen Exam:Alert awake-not in any distress HEENT:atraumatic, normocephalic Chest: B/L clear to auscultation anteriorly CVS:S1S2 regular Abdomen:soft non tender, non distended Extremities:no edema Neurology: Non focal Skin: no rash  Pertinent Labs/Radiology:    Latest Ref Rng & Units 03/06/2024    4:41 AM  03/05/2024    4:47 AM 03/04/2024    8:02 AM  CBC  WBC 4.0 - 10.5 K/uL 10.1  10.1  10.9   Hemoglobin 13.0 - 17.0 g/dL 8.3  8.1  8.7   Hematocrit 39.0 - 52.0 % 27.6  27.9  29.6   Platelets 150 - 400 K/uL 251  216  223     Lab Results  Component Value Date   NA 140 03/06/2024   K 4.9 03/06/2024   CL 106 03/06/2024   CO2 27 03/06/2024      Assessment/Plan: Acute chronic hypoxic respiratory failure secondary to pulmonary hypertension with right heart failure due to presumed pulmonary embolism with worsening clot burden in spite of IVC filter placement on 4/11. Hypoxia gradually improving with IV Lasix /IV heparin -down to 3-4 L of oxygen Unfortunately-due to renal function-unable to do a CT angiogram study given risk for contrast-induced nephropathy Very difficult situation-he recently had a GI bleed-then had a PE/DVT and was placed on anticoagulation and had a hospitalized for recurrent GI bleeding requiring 4 units of PRBC.  IVC filter was subsequently placed-looks like in spite of having a IVC filter-patient may have embolized-and increase his clot burden in his lung.  He seems to be tolerating IV heparin  for the past 5 days well-Long discussion with patient and then with spouse Adriana Hopping over the phone-risk/benefits of anticoagulation discussed-they understand the risk of GI bleeding secondary to known history of AVMs-at this point they accept risk of life-threatening/life disabling GI bleed but would want to continue with anticoagulation.  Family will watch over him closely postdischarge-monitor for signs of GI bleed and try to bring him to the emergency room as soon as possible.  Will switch him over to Eliquis   today and watch him closely for the next 1-2 days to ensure he continues to be stable before discharge home over the weekend.   Recent diagnosis of PE/DVT (hospitalized from 4/4-4/7-started on anticoagulation)-but subsequently presented with GI bleeding with acute blood loss  anemia-hypovolemic shock requiring hospitalization from 4/10-4/13-required 4 units of PRBC-IVC filter placed-anticoagulation discontinued. See above-regarding discussion with patient/family regarding risks/benefits of anticoagulation.  AKI on CKD stage IIIb  AKI presumably hemodynamically mediated-possibly due to cardiorenal syndrome. Creatinine overall improved and close to baseline at this point Avoid nephrotoxic agents and monitor closely.  Gastrointestinal AVMs-recent history of upper GI bleeding No bleeding for the past few days while on IV heparin -but recently hospitalized for GI bleed after being started on anticoagulation for PE/DVT. Maintained on PPI-watch closely-follow CBCs. Difficult situation regarding anticoagulation-see above.  History of nonobstructive CAD No anginal symptoms  HTN BP relatively stable Continue budipine  Asthma without exacerbation Pulmonary hypertension Chronic hypoxic respiratory failure on 3 L of oxygen at baseline OSA Continue bronchodilators Stable on 3 L of oxygen Not on home CPAP-needs outpatient sleep study.   DM-2 (A1c 6.2 on 2/13) CBGs stable on SSI  Recent Labs    03/05/24 1617 03/05/24 2137 03/06/24 0758  GLUCAP 124* 126* 92      HLD Statin    Dementia Mood disorder Delirium precautions Continue Namenda /olanzapine   5 mm middle lobe nodule Incidental finding on CT chest. Stable per radiology report Continue outpatient monitoring.  BMI: Estimated body mass index is 25.31 kg/m as calculated from the following:   Height as of this encounter: 5\' 11"  (1.803 m).   Weight as of this encounter: 82.3 kg.   Code status:   Code Status: Full Code   DVT Prophylaxis: IV Heparin    Family Communication: Gonzella Latin [phone] updated over the phone on 4/24.   Disposition Plan: Status is: Inpatient Remains inpatient appropriate because: Severity of illness   Planned Discharge Destination:Home   Diet: Diet Order              Diet Carb Modified Fluid consistency: Thin; Room service appropriate? Yes  Diet effective now                     Antimicrobial agents: Anti-infectives (From admission, onward)    Start     Dose/Rate Route Frequency Ordered Stop   03/03/24 1000  vancomycin  (VANCOREADY) IVPB 750 mg/150 mL  Status:  Discontinued        750 mg 150 mL/hr over 60 Minutes Intravenous Every 24 hours 03/02/24 0427 03/02/24 0852   03/02/24 1500  ceFEPIme  (MAXIPIME ) 2 g in sodium chloride  0.9 % 100 mL IVPB  Status:  Discontinued        2 g 200 mL/hr over 30 Minutes Intravenous Every 12 hours 03/02/24 0424 03/02/24 0852   03/02/24 0215  vancomycin  (VANCOREADY) IVPB 2000 mg/400 mL        2,000 mg 200 mL/hr over 120 Minutes Intravenous  Once 03/02/24 0212 03/02/24 0856   03/02/24 0215  ceFEPIme  (MAXIPIME ) 2 g in sodium chloride  0.9 % 100 mL IVPB        2 g 200 mL/hr over 30 Minutes Intravenous  Once 03/02/24 0212 03/02/24 0556        MEDICATIONS: Scheduled Meds:  amLODipine   10 mg Oral Daily   atorvastatin   20 mg Oral Daily   budesonide  (PULMICORT ) nebulizer solution  0.25 mg Nebulization BID   cyanocobalamin   250 mcg Oral Daily   escitalopram   10  mg Oral Daily   furosemide   40 mg Intravenous Daily   guaiFENesin   5 mL Oral Q6H   insulin  aspart  0-6 Units Subcutaneous TID WC   ipratropium-albuterol   3 mL Nebulization BID   memantine   10 mg Oral BID   pantoprazole   40 mg Oral BID   sodium chloride  flush  3 mL Intravenous Q12H   Continuous Infusions:  heparin  1,050 Units/hr (03/05/24 2214)   PRN Meds:.acetaminophen  **OR** acetaminophen , albuterol , ALPRAZolam , melatonin, OLANZapine , ondansetron  (ZOFRAN ) IV   I have personally reviewed following labs and imaging studies  LABORATORY DATA: CBC: Recent Labs  Lab 03/01/24 2322 03/02/24 0026 03/03/24 0444 03/03/24 2212 03/04/24 0802 03/05/24 0447 03/06/24 0441  WBC 11.2* 11.1* 9.8 11.1* 10.9* 10.1 10.1  NEUTROABS 7.1 7.0  --    --   --   --   --   HGB 9.3* 8.8* 8.3* 8.5* 8.7* 8.1* 8.3*  HCT 34.0* 31.3* 29.1* 28.9* 29.6* 27.9* 27.6*  MCV 90.9 91.0 86.1 83.5 83.1 83.5 83.1  PLT 226 239 217 212 223 216 251    Basic Metabolic Panel: Recent Labs  Lab 03/01/24 2322 03/02/24 0026 03/03/24 0444 03/04/24 0802 03/05/24 0447 03/06/24 0441  NA 141 139 142 140 143 140  K 4.7 5.0 5.2* 4.4 4.6 4.9  CL 108 108 108 109 107 106  CO2 24 23 25 24 28 27   GLUCOSE 139* 129* 155* 94 119* 100*  BUN 22 22 30* 27* 28* 31*  CREATININE 2.64* 2.50* 2.27* 1.94* 2.13* 1.88*  CALCIUM  8.9 8.7* 8.7* 9.2 9.0 8.9  MG 1.6*  --   --   --   --   --   PHOS 2.9  --   --   --   --   --     GFR: Estimated Creatinine Clearance: 33.4 mL/min (A) (by C-G formula based on SCr of 1.88 mg/dL (H)).  Liver Function Tests: Recent Labs  Lab 03/01/24 2322 03/03/24 0444  AST 22 17  ALT 18 15  ALKPHOS 61 55  BILITOT 0.5 0.4  PROT 6.1* 5.3*  ALBUMIN 3.1* 2.6*   No results for input(s): "LIPASE", "AMYLASE" in the last 168 hours. No results for input(s): "AMMONIA" in the last 168 hours.  Coagulation Profile: No results for input(s): "INR", "PROTIME" in the last 168 hours.  Cardiac Enzymes: No results for input(s): "CKTOTAL", "CKMB", "CKMBINDEX", "TROPONINI" in the last 168 hours.  BNP (last 3 results) Recent Labs    12/27/23 1105  PROBNP 1,952*    Lipid Profile: No results for input(s): "CHOL", "HDL", "LDLCALC", "TRIG", "CHOLHDL", "LDLDIRECT" in the last 72 hours.  Thyroid  Function Tests: No results for input(s): "TSH", "T4TOTAL", "FREET4", "T3FREE", "THYROIDAB" in the last 72 hours.  Anemia Panel: No results for input(s): "VITAMINB12", "FOLATE", "FERRITIN", "TIBC", "IRON ", "RETICCTPCT" in the last 72 hours.  Urine analysis:    Component Value Date/Time   COLORURINE YELLOW 02/23/2024 0635   APPEARANCEUR CLEAR 02/23/2024 0635   LABSPEC 1.009 02/23/2024 0635   PHURINE 6.0 02/23/2024 0635   GLUCOSEU NEGATIVE 02/23/2024 0635    HGBUR NEGATIVE 02/23/2024 0635   BILIRUBINUR NEGATIVE 02/23/2024 0635   KETONESUR NEGATIVE 02/23/2024 0635   PROTEINUR NEGATIVE 02/23/2024 0635   UROBILINOGEN 0.2 04/17/2015 1820   NITRITE NEGATIVE 02/23/2024 0635   LEUKOCYTESUR NEGATIVE 02/23/2024 0635    Sepsis Labs: Lactic Acid, Venous    Component Value Date/Time   LATICACIDVEN 2.5 (HH) 02/22/2024 1546    MICROBIOLOGY: Recent Results (from the past 240 hours)  Resp panel by RT-PCR (RSV, Flu A&B, Covid) Anterior Nasal Swab     Status: None   Collection Time: 03/01/24 10:50 PM   Specimen: Anterior Nasal Swab  Result Value Ref Range Status   SARS Coronavirus 2 by RT PCR NEGATIVE NEGATIVE Final   Influenza A by PCR NEGATIVE NEGATIVE Final   Influenza B by PCR NEGATIVE NEGATIVE Final    Comment: (NOTE) The Xpert Xpress SARS-CoV-2/FLU/RSV plus assay is intended as an aid in the diagnosis of influenza from Nasopharyngeal swab specimens and should not be used as a sole basis for treatment. Nasal washings and aspirates are unacceptable for Xpert Xpress SARS-CoV-2/FLU/RSV testing.  Fact Sheet for Patients: BloggerCourse.com  Fact Sheet for Healthcare Providers: SeriousBroker.it  This test is not yet approved or cleared by the [place]  FDA and has been authorized for detection and/or diagnosis of SARS-CoV-2 by FDA under an Emergency Use Authorization (EUA). This EUA will remain in effect (meaning this test can be used) for the duration of the COVID-19 declaration under Section 564(b)(1) of the Act, 21 U.S.C. section 360bbb-3(b)(1), unless the authorization is terminated or revoked.     Resp Syncytial Virus by PCR NEGATIVE NEGATIVE Final    Comment: (NOTE) Fact Sheet for Patients: BloggerCourse.com  Fact Sheet for Healthcare Providers: SeriousBroker.it  This test is not yet approved or cleared by the [place]  FDA  and has been authorized for detection and/or diagnosis of SARS-CoV-2 by FDA under an Emergency Use Authorization (EUA). This EUA will remain in effect (meaning this test can be used) for the duration of the COVID-19 declaration under Section 564(b)(1) of the Act, 21 U.S.C. section 360bbb-3(b)(1), unless the authorization is terminated or revoked.  Performed at [facility], [address], [place], [place] [address]     RADIOLOGY STUDIES/RESULTS: VAS US  LOWER EXTREMITY VENOUS (DVT) Result Date: 03/05/2024  Lower Venous DVT Study Patient Name:  Canio Winokur.  Date of Exam:   03/05/2024 Medical Rec #: [id number]          Accession #:    [id number] Date of Birth: April 20, 1941           Patient Gender: M Patient Age:   60 years Exam Location:  [facility] Procedure:      VAS US  LOWER EXTREMITY VENOUS (DVT) Referring Phys: Kimberly Penna --------------------------------------------------------------------------------  Indications: Recent DVT - S/P IVC filter with worsening hypoxia-assess for new clot burden.  Comparison Study: 02/22/24 - Positive DVT in left popliteal vein. Performing Technologist: Franky Ivanoff Sturdivant-Jones RDMS, RVT  Examination Guidelines: A complete evaluation includes B-mode imaging, spectral Doppler, color Doppler, and power Doppler as needed of all accessible portions of each vessel. Bilateral testing is considered an integral part of a complete examination. Limited examinations for reoccurring indications may be performed as noted. The reflux portion of the exam is performed with the patient in reverse Trendelenburg.  +---------+---------------+---------+-----------+----------+--------------+ RIGHT    CompressibilityPhasicitySpontaneityPropertiesThrombus Aging +---------+---------------+---------+-----------+----------+--------------+ CFV      Full           Yes      Yes                                  +---------+---------------+---------+-----------+----------+--------------+ SFJ      Full                                                        +---------+---------------+---------+-----------+----------+--------------+  FV Prox  Full                                                        +---------+---------------+---------+-----------+----------+--------------+ FV Mid   Full                                                        +---------+---------------+---------+-----------+----------+--------------+ FV DistalFull                                                        +---------+---------------+---------+-----------+----------+--------------+ PFV      Full                                                        +---------+---------------+---------+-----------+----------+--------------+ POP      Full           Yes      Yes                                 +---------+---------------+---------+-----------+----------+--------------+ PTV      Full                                                        +---------+---------------+---------+-----------+----------+--------------+ PERO     Full                                                        +---------+---------------+---------+-----------+----------+--------------+   +---------+---------------+---------+-----------+----------+--------------+ LEFT     CompressibilityPhasicitySpontaneityPropertiesThrombus Aging +---------+---------------+---------+-----------+----------+--------------+ CFV      Full           Yes      Yes                                 +---------+---------------+---------+-----------+----------+--------------+ SFJ      Full                                                        +---------+---------------+---------+-----------+----------+--------------+ FV Prox  Full                                                         +---------+---------------+---------+-----------+----------+--------------+  FV Mid   Full                                                        +---------+---------------+---------+-----------+----------+--------------+ FV DistalFull                                                        +---------+---------------+---------+-----------+----------+--------------+ PFV      Full                                                        +---------+---------------+---------+-----------+----------+--------------+ POP      Full           Yes      Yes                                 +---------+---------------+---------+-----------+----------+--------------+ PTV      Full                                                        +---------+---------------+---------+-----------+----------+--------------+ PERO     Full                                                        +---------+---------------+---------+-----------+----------+--------------+     Summary: BILATERAL: - No evidence of deep vein thrombosis seen in the lower extremities, bilaterally. -No evidence of popliteal cyst, bilaterally.  LEFT: - Findings suggest resolution of previously noted thrombus.  *See table(s) above for measurements and observations.    Preliminary      LOS: 4 days   Kimberly Penna, MD  [facility]    To contact the attending provider between 7A-7P or the covering provider during after hours 7P-7A, please log into the web site [email or web address] and access using universal [place] password for that web site. If you do not have the password, please call the hospital operator.  03/06/2024, 9:29 AM

## 2024-03-06 NOTE — Consult Note (Addendum)
 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 Mayo NP at [facility], last seen 01/15/2024 and requires convexity and an ostomy belt to prevent leaks  Stoma type/location: RLQ ileal conduit  Stomal assessment/size:as of 01/15/2024 1" pink and budded  Peristomal assessment:  Treatment options for stomal/peristomal skin: 2" barrier ring, no sting barrier wipes and stoma powder Timm Foot #6)  Output  Ostomy pouching: 1pc. Convex urostomy pouch ([phone]# [phone])  with 2" barrier ring placed directly around stoma Timm Foot [phone])  Education provided: none, patient has been educated on ostomy care Enrolled patient in [employer] DC program: previously, now supplies ordered through ostomy clinic   Bedside RN should remove current pouch, clean skin around stoma with water moistened washcloth.  If skin is broken down/denuded apply a small amount of stoma powder Timm Foot #6) then crust with no sting barrier wipe (blue and white packet).  After crusting skin stretch and place a 2" barrier ring directly around stoma. Cut new skin barrier to match size of stoma and place on top of the 2" barrier ring.  Would again recommend use of a ostomy belt to help prevent leaks.  Hooking urostomy pouch to bedside drainage bag will also help prevent leaks.  Will need urostomy adapter which is Timm Foot [phone]  Secure chat sent to bedside nurse.  WOC team will not follow. Re-consult if further needs arise.   Thank you,    Ronni Colace MSN, RN-BC, [employer] [phone]

## 2024-03-07 DIAGNOSIS — I2694 Multiple subsegmental pulmonary emboli without acute cor pulmonale: Secondary | ICD-10-CM | POA: Diagnosis not present

## 2024-03-07 DIAGNOSIS — I8290 Acute embolism and thrombosis of unspecified vein: Secondary | ICD-10-CM | POA: Diagnosis not present

## 2024-03-07 DIAGNOSIS — J9621 Acute and chronic respiratory failure with hypoxia: Secondary | ICD-10-CM | POA: Diagnosis not present

## 2024-03-07 DIAGNOSIS — F0390 Unspecified dementia without behavioral disturbance: Secondary | ICD-10-CM | POA: Diagnosis not present

## 2024-03-07 LAB — GLUCOSE, CAPILLARY
Glucose-Capillary: 98 mg/dL (ref 70–99)
Glucose-Capillary: 109 mg/dL — ABNORMAL HIGH (ref 70–99)
Glucose-Capillary: 141 mg/dL — ABNORMAL HIGH (ref 70–99)
Glucose-Capillary: 125 mg/dL — ABNORMAL HIGH (ref 70–99)

## 2024-03-07 LAB — CBC
HCT: 27.1 % — ABNORMAL LOW (ref 39.0–52.0)
Hemoglobin: 8 g/dL — ABNORMAL LOW (ref 13.0–17.0)
MCH: 24.5 pg — ABNORMAL LOW (ref 26.0–34.0)
MCHC: 29.5 g/dL — ABNORMAL LOW (ref 30.0–36.0)
MCV: 82.9 fL (ref 80.0–100.0)
Platelets: 264 10*3/uL (ref 150–400)
RBC: 3.27 MIL/uL — ABNORMAL LOW (ref 4.22–5.81)
RDW: 18.4 % — ABNORMAL HIGH (ref 11.5–15.5)
WBC: 8.2 10*3/uL (ref 4.0–10.5)
nRBC: 0.2 % (ref 0.0–0.2)

## 2024-03-07 LAB — BASIC METABOLIC PANEL WITH GFR
Anion gap: 9 (ref 5–15)
BUN: 34 mg/dL — ABNORMAL HIGH (ref 8–23)
CO2: 25 mmol/L (ref 22–32)
Calcium: 9.1 mg/dL (ref 8.9–10.3)
Chloride: 105 mmol/L (ref 98–111)
Creatinine, Ser: 1.88 mg/dL — ABNORMAL HIGH (ref 0.61–1.24)
GFR, Estimated: 36 mL/min — ABNORMAL LOW (ref >60)
Glucose, Bld: 90 mg/dL (ref 70–99)
Potassium: 5.2 mmol/L — ABNORMAL HIGH (ref 3.5–5.1)
Sodium: 139 mmol/L (ref 135–145)

## 2024-03-07 MED ORDER — SODIUM ZIRCONIUM CYCLOSILICATE 10 G PO PACK
10.0000 g | PACK | Freq: Every day | ORAL | Status: AC
  Administered 2024-03-07: 10 g via ORAL
  Filled 2024-03-07: qty 1

## 2024-03-07 MED ORDER — METOPROLOL SUCCINATE ER 25 MG PO TB24
12.5000 mg | ORAL_TABLET | Freq: Every day | ORAL | Status: DC
  Administered 2024-03-07: 12.5 mg via ORAL
  Filled 2024-03-07: qty 1

## 2024-03-07 MED ORDER — POLYETHYLENE GLYCOL 3350 17 G PO PACK
17.0000 g | PACK | Freq: Every day | ORAL | Status: DC
  Administered 2024-03-07: 17 g via ORAL
  Filled 2024-03-07: qty 1

## 2024-03-07 MED ORDER — TORSEMIDE 20 MG PO TABS: 20.0000 mg | ORAL_TABLET | Freq: Every day | ORAL | Status: DC

## 2024-03-07 MED ORDER — TORSEMIDE 20 MG PO TABS
40.0000 mg | ORAL_TABLET | Freq: Every day | ORAL | Status: DC
  Administered 2024-03-07 – 2024-03-08 (×2): 40 mg via ORAL
  Filled 2024-03-07 (×2): qty 2

## 2024-03-07 NOTE — Progress Notes (Signed)
 Mobility Specialist Progress Note:   03/07/24 1005  Mobility  Activity Ambulated with assistance in hallway  Level of Assistance Contact guard assist, steadying assist  Assistive Device Front wheel walker  Distance Ambulated (ft) 80 ft  Activity Response Tolerated well  Mobility Referral Yes  Mobility visit 1 Mobility  Mobility Specialist Start Time (ACUTE ONLY) 0940  Mobility Specialist Stop Time (ACUTE ONLY) 0954  Mobility Specialist Time Calculation (min) (ACUTE ONLY) 14 min   Pt received in bed agreeable to mobility. No physical assistance needed during session. Attempted to ambulate on 3L/min SPO2 stayed around 91% during ambulation but when returned to room SPO2 dropped to 85%. Situated in chair instructed to perform PLB, SPO2 returned to 91%. Left in chair w/ call bell and personal belongings in reach. All needs met.   Pre Mobility 3L/min SPO2 97% During Mobility 3L/min SPO2 85%-91% Post Mobility 3L/min SPO2 97%  Jerry [name] Mobility Specialist  Please contact vis Secure Chat or  Rehab Office [phone]

## 2024-03-07 NOTE — Progress Notes (Signed)
 PROGRESS NOTE        PATIENT DETAILS Name: Jerry [name]. Age: 83 y.o. Sex: male Date of Birth: [birth year] Admit Date: 03/01/2024 Admitting Physician Roxana Copier, DO OZH:[id number], Jullie Oiler, MD  Brief Summary: Patient is a 83 y.o.  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  Significant events: 4/19>> worsening shortness of breath-acute on chronic hypoxic respiratory failure-admit to [facility].  Significant studies: 4/20>> CT chest: No acute findings. 4/21>> Limited echo: EF 55-60%, RV systolic function moderately reduced-RVSP 68.5. 4/23>> B/L lower extremity Doppler: Resolution of previously seen thrombus-no DVT bilaterally.  Significant microbiology data: 4/19>> COVID/influenza/RSV PCR: Negative  Procedures: None  Consults: Cardiology  Subjective: No major issues overnight-lying flat in bed.  Brown stools again yesterday.  Objective: Vitals: Blood pressure 109/85, pulse 89, temperature 97.7 F (36.5 C), temperature source Oral, resp. rate (!) 21, height 5\' 11"  (1.803 m), weight 82.3 kg, SpO2 95%.   Exam: Gen Exam:Alert awake-not in any distress HEENT:atraumatic, normocephalic Chest: B/L clear to auscultation anteriorly CVS:S1S2 regular Abdomen:soft non tender, non distended Extremities:no edema Neurology: Non focal Skin: no rash  Pertinent Labs/Radiology:    Latest Ref Rng &  Units 03/07/2024    4:53 AM 03/06/2024    4:41 AM 03/05/2024    4:47 AM  CBC  WBC 4.0 - 10.5 K/uL 8.2  10.1  10.1   Hemoglobin 13.0 - 17.0 g/dL 8.0  8.3  8.1   Hematocrit 39.0 - 52.0 % 27.1  27.6  27.9   Platelets 150 - 400 K/uL 264  251  216     Lab Results  Component Value Date   NA 139 03/07/2024   K 5.2 (H) 03/07/2024   CL 105 03/07/2024   CO2 25 03/07/2024      Assessment/Plan: Acute chronic hypoxic respiratory failure secondary to pulmonary hypertension with right heart failure due to presumed pulmonary embolism with worsening clot burden in spite of IVC filter placement on 4/11. Significant improvement in the past several days-down to 3-4 L of oxygen (3 L at baseline) On IV Lasix  Was on IV heparin  but converted to Eliquis  on 4/24. Unable to perform CTA given renal function Very difficult situation-he recently had a GI bleed-then had a PE/DVT and was placed on anticoagulation and had to be hospitalized for recurrent GI bleeding requiring 4 units of PRBC.  IVC filter was subsequently placed-looks like in spite of having a IVC filter-patient may have embolized-and increase his clot burden in his lung.  Since he tolerated IV heparin  for for 4-5 days after extensive discussion of risks versus benefits with spouse Adriana Hopping over the phone on 4/24-including recurrent GI bleeding that could be potentially life-threatening//disabling-family/patient elected to continue anticoagulation.  Plan is to watch him for another 24 hours-if he remains stable-hopefully we can get him home on 4/26.    Recent diagnosis of PE/DVT (hospitalized from 4/4-4/7-started on anticoagulation)-but subsequently presented with GI bleeding with  acute blood loss anemia-hypovolemic shock requiring hospitalization from 4/10-4/13-required 4 units of PRBC-IVC filter placed-anticoagulation discontinued. See above-regarding discussion with patient/family regarding risks/benefits of anticoagulation.  AKI on CKD stage IIIb  AKI  presumably hemodynamically mediated-possibly due to cardiorenal syndrome. Creatinine overall improved and close to baseline at this point Avoid nephrotoxic agents and monitor closely.  Hyperkalemia Mild Lokelma  x 1 Repeat electrolytes tomorrow.  Gastrointestinal AVMs-recent history of upper GI bleeding No bleeding for the past few days while on anticoagulation-but recently hospitalized for GI bleed after being started on anticoagulation for PE/DVT. Maintained on PPI-watch closely-follow CBCs. Difficult situation regarding anticoagulation-see above.  History of nonobstructive CAD No anginal symptoms  HTN BP relatively stable Continue amlodipine .  Asthma without exacerbation Pulmonary hypertension Chronic hypoxic respiratory failure on 3 L of oxygen at baseline OSA Continue bronchodilators Not on home CPAP-needs outpatient sleep study.   DM-2 (A1c 6.2 on 2/13) CBGs stable on SSI  Recent Labs    03/06/24 1217 03/06/24 1706 03/06/24 2042  GLUCAP 94 108* 103*      HLD Statin    Dementia Mood disorder Delirium precautions Continue Namenda /olanzapine   5 mm middle lobe nodule Incidental finding on CT chest. Stable per radiology report Continue outpatient monitoring.  BMI: Estimated body mass index is 25.31 kg/m as calculated from the following:   Height as of this encounter: 5\' 11"  (1.803 m).   Weight as of this encounter: 82.3 kg.   Code status:   Code Status: Full Code   DVT Prophylaxis: IV Heparin  apixaban  (ELIQUIS ) tablet 5 mg   Family Communication: Spouse-Mary [phone] updated over the phone on 4/24.   Disposition Plan: Status is: Inpatient Remains inpatient appropriate because: Severity of illness   Planned Discharge Destination:Home   Diet: Diet Order             Diet Carb Modified Fluid consistency: Thin; Room service appropriate? Yes  Diet effective now                     Antimicrobial agents: Anti-infectives (From  admission, onward)    Start     Dose/Rate Route Frequency Ordered Stop   03/03/24 1000  vancomycin  (VANCOREADY) IVPB 750 mg/150 mL  Status:  Discontinued        750 mg 150 mL/hr over 60 Minutes Intravenous Every 24 hours 03/02/24 0427 03/02/24 0852   03/02/24 1500  ceFEPIme  (MAXIPIME ) 2 g in sodium chloride  0.9 % 100 mL IVPB  Status:  Discontinued        2 g 200 mL/hr over 30 Minutes Intravenous Every 12 hours 03/02/24 0424 03/02/24 0852   03/02/24 0215  vancomycin  (VANCOREADY) IVPB 2000 mg/400 mL        2,000 mg 200 mL/hr over 120 Minutes Intravenous  Once 03/02/24 0212 03/02/24 0856   03/02/24 0215  ceFEPIme  (MAXIPIME ) 2 g in sodium chloride  0.9 % 100 mL IVPB        2 g 200 mL/hr over 30 Minutes Intravenous  Once 03/02/24 0212 03/02/24 0556        MEDICATIONS: Scheduled Meds:  amLODipine   10 mg Oral Daily   apixaban   5 mg Oral BID   atorvastatin   20 mg Oral Daily   budesonide  (PULMICORT ) nebulizer solution  0.25 mg Nebulization BID   cyanocobalamin   250 mcg Oral Daily   escitalopram   10 mg Oral Daily   guaiFENesin   5 mL Oral Q6H   insulin  aspart  0-6 Units Subcutaneous TID WC   ipratropium-albuterol   3 mL Nebulization BID   memantine   10 mg Oral BID   pantoprazole   40 mg Oral BID   sodium chloride  flush  3 mL Intravenous Q12H   Continuous Infusions:   PRN Meds:.acetaminophen  **OR** acetaminophen , albuterol , ALPRAZolam , melatonin, OLANZapine , ondansetron  (ZOFRAN ) IV   I have personally reviewed following labs and imaging studies  LABORATORY DATA: CBC: Recent Labs  Lab 03/01/24 2322 03/02/24 0026 03/03/24 0444 03/03/24 2212 03/04/24 0802 03/05/24 0447 03/06/24 0441 03/07/24 0453  WBC 11.2* 11.1*   < > 11.1* 10.9* 10.1 10.1 8.2  NEUTROABS 7.1 7.0  --   --   --   --   --   --   HGB 9.3* 8.8*   < > 8.5* 8.7* 8.1* 8.3* 8.0*  HCT 34.0* 31.3*   < > 28.9* 29.6* 27.9* 27.6* 27.1*  MCV 90.9 91.0   < > 83.5 83.1 83.5 83.1 82.9  PLT 226 239   < > 212 223 216 251 264    < > = values in this interval not displayed.    Basic Metabolic Panel: Recent Labs  Lab 03/01/24 2322 03/02/24 0026 03/03/24 0444 03/04/24 0802 03/05/24 0447 03/06/24 0441 03/07/24 0453  NA 141   < > 142 140 143 140 139  K 4.7   < > 5.2* 4.4 4.6 4.9 5.2*  CL 108   < > 108 109 107 106 105  CO2 24   < > 25 24 28 27 25   GLUCOSE 139*   < > 155* 94 119* 100* 90  BUN 22   < > 30* 27* 28* 31* 34*  CREATININE 2.64*   < > 2.27* 1.94* 2.13* 1.88* 1.88*  CALCIUM  8.9   < > 8.7* 9.2 9.0 8.9 9.1  MG 1.6*  --   --   --   --   --   --   PHOS 2.9  --   --   --   --   --   --    < > = values in this interval not displayed.    GFR: Estimated Creatinine Clearance: 33.4 mL/min (A) (by C-G formula based on SCr of 1.88 mg/dL (H)).  Liver Function Tests: Recent Labs  Lab 03/01/24 2322 03/03/24 0444  AST 22 17  ALT 18 15  ALKPHOS 61 55  BILITOT 0.5 0.4  PROT 6.1* 5.3*  ALBUMIN 3.1* 2.6*   No results for input(s): "LIPASE", "AMYLASE" in the last 168 hours. No results for input(s): "AMMONIA" in the last 168 hours.  Coagulation Profile: No results for input(s): "INR", "PROTIME" in the last 168 hours.  Cardiac Enzymes: No results for input(s): "CKTOTAL", "CKMB", "CKMBINDEX", "TROPONINI" in the last 168 hours.  BNP (last 3 results) Recent Labs    12/27/23 1105  PROBNP 1,952*    Lipid Profile: No results for input(s): "CHOL", "HDL", "LDLCALC", "TRIG", "CHOLHDL", "LDLDIRECT" in the last 72 hours.  Thyroid  Function Tests: No results for input(s): "TSH", "T4TOTAL", "FREET4", "T3FREE", "THYROIDAB" in the last 72 hours.  Anemia Panel: No results for input(s): "VITAMINB12", "FOLATE", "FERRITIN", "TIBC", "IRON ", "RETICCTPCT" in the last 72 hours.  Urine analysis:    Component Value Date/Time   COLORURINE YELLOW 02/23/2024 0635   APPEARANCEUR CLEAR 02/23/2024 0635   LABSPEC 1.009 02/23/2024 0635   PHURINE 6.0 02/23/2024 0635   GLUCOSEU NEGATIVE 02/23/2024 0635   HGBUR NEGATIVE  02/23/2024 0635   BILIRUBINUR NEGATIVE 02/23/2024 0635   KETONESUR NEGATIVE 02/23/2024 0635   PROTEINUR NEGATIVE 02/23/2024 1610  UROBILINOGEN 0.2 04/17/2015 1820   NITRITE NEGATIVE 02/23/2024 0635   LEUKOCYTESUR NEGATIVE 02/23/2024 2130    Sepsis Labs: Lactic Acid, Venous    Component Value Date/Time   LATICACIDVEN 2.5 (HH) 02/22/2024 1546    MICROBIOLOGY: Recent Results (from the past 240 hours)  Resp panel by RT-PCR (RSV, Flu A&B, Covid) Anterior Nasal Swab     Status: None   Collection Time: 03/01/24 10:50 PM   Specimen: Anterior Nasal Swab  Result Value Ref Range Status   SARS Coronavirus 2 by RT PCR NEGATIVE NEGATIVE Final   Influenza A by PCR NEGATIVE NEGATIVE Final   Influenza B by PCR NEGATIVE NEGATIVE Final    Comment: (NOTE) The Xpert Xpress SARS-CoV-2/FLU/RSV plus assay is intended as an aid in the diagnosis of influenza from Nasopharyngeal swab specimens and should not be used as a sole basis for treatment. Nasal washings and aspirates are unacceptable for Xpert Xpress SARS-CoV-2/FLU/RSV testing.  Fact Sheet for Patients: BloggerCourse.com  Fact Sheet for Healthcare Providers: SeriousBroker.it  This test is not yet approved or cleared by the [place]  FDA and has been authorized for detection and/or diagnosis of SARS-CoV-2 by FDA under an Emergency Use Authorization (EUA). This EUA will remain in effect (meaning this test can be used) for the duration of the COVID-19 declaration under Section 564(b)(1) of the Act, 21 U.S.C. section 360bbb-3(b)(1), unless the authorization is terminated or revoked.     Resp Syncytial Virus by PCR NEGATIVE NEGATIVE Final    Comment: (NOTE) Fact Sheet for Patients: BloggerCourse.com  Fact Sheet for Healthcare Providers: SeriousBroker.it  This test is not yet approved or cleared by the [place]  FDA and has been  authorized for detection and/or diagnosis of SARS-CoV-2 by FDA under an Emergency Use Authorization (EUA). This EUA will remain in effect (meaning this test can be used) for the duration of the COVID-19 declaration under Section 564(b)(1) of the Act, 21 U.S.C. section 360bbb-3(b)(1), unless the authorization is terminated or revoked.  Performed at [facility], [address], [place], [place] [address]     RADIOLOGY STUDIES/RESULTS: VAS US  LOWER EXTREMITY VENOUS (DVT) Result Date: 03/06/2024  Lower Venous DVT Study Patient Name:  Anthonio Mizzell.  Date of Exam:   03/05/2024 Medical Rec #: [id number]          Accession #:    [id number] Date of Birth: May 03, 1941           Patient Gender: M Patient Age:   22 years Exam Location:  [facility] Procedure:      VAS US  LOWER EXTREMITY VENOUS (DVT) Referring Phys: Kimberly Penna --------------------------------------------------------------------------------  Indications: Recent DVT - S/P IVC filter with worsening hypoxia-assess for new clot burden.  Comparison Study: 02/22/24 - Positive DVT in left popliteal vein. Performing Technologist: Franky Ivanoff Sturdivant-Jones RDMS, RVT  Examination Guidelines: A complete evaluation includes B-mode imaging, spectral Doppler, color Doppler, and power Doppler as needed of all accessible portions of each vessel. Bilateral testing is considered an integral part of a complete examination. Limited examinations for reoccurring indications may be performed as noted. The reflux portion of the exam is performed with the patient in reverse Trendelenburg.  +---------+---------------+---------+-----------+----------+--------------+ RIGHT    CompressibilityPhasicitySpontaneityPropertiesThrombus Aging +---------+---------------+---------+-----------+----------+--------------+ CFV      Full           Yes      Yes                                  +---------+---------------+---------+-----------+----------+--------------+  SFJ      Full                                                        +---------+---------------+---------+-----------+----------+--------------+ FV Prox  Full                                                        +---------+---------------+---------+-----------+----------+--------------+ FV Mid   Full                                                        +---------+---------------+---------+-----------+----------+--------------+ FV DistalFull                                                        +---------+---------------+---------+-----------+----------+--------------+ PFV      Full                                                        +---------+---------------+---------+-----------+----------+--------------+ POP      Full           Yes      Yes                                 +---------+---------------+---------+-----------+----------+--------------+ PTV      Full                                                        +---------+---------------+---------+-----------+----------+--------------+ PERO     Full                                                        +---------+---------------+---------+-----------+----------+--------------+   +---------+---------------+---------+-----------+----------+--------------+ LEFT     CompressibilityPhasicitySpontaneityPropertiesThrombus Aging +---------+---------------+---------+-----------+----------+--------------+ CFV      Full           Yes      Yes                                 +---------+---------------+---------+-----------+----------+--------------+ SFJ      Full                                                        +---------+---------------+---------+-----------+----------+--------------+  FV Prox  Full                                                         +---------+---------------+---------+-----------+----------+--------------+ FV Mid   Full                                                        +---------+---------------+---------+-----------+----------+--------------+ FV DistalFull                                                        +---------+---------------+---------+-----------+----------+--------------+ PFV      Full                                                        +---------+---------------+---------+-----------+----------+--------------+ POP      Full           Yes      Yes                                 +---------+---------------+---------+-----------+----------+--------------+ PTV      Full                                                        +---------+---------------+---------+-----------+----------+--------------+ PERO     Full                                                        +---------+---------------+---------+-----------+----------+--------------+     Summary: BILATERAL: - No evidence of deep vein thrombosis seen in the lower extremities, bilaterally. -No evidence of popliteal cyst, bilaterally.  LEFT: - Findings suggest resolution of previously noted thrombus.  *See table(s) above for measurements and observations. Electronically signed by Runell Countryman on 03/06/2024 at 5:22:59 PM.    Final      LOS: 5 days   Kimberly Penna, MD  [facility]    To contact the attending provider between 7A-7P or the covering provider during after hours 7P-7A, please log into the web site [email or web address] and access using universal [place] password for that web site. If you do not have the password, please call the hospital operator.  03/07/2024, 8:01 AM

## 2024-03-07 NOTE — Progress Notes (Signed)
 Physical Therapy Treatment Patient Details Name: Jerry [name]. MRN: [id number] DOB: [birth year] Today's Date: 03/07/2024   History of Present Illness 83 y.o. male presents to [facility] on 03/02/24 with acute on chronic respiratory failure secondary to COPD/asthma exacerbation and suspected R heart failure exacerbation. 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    PT Comments  Pt seen for PT tx with pt agreeable, wife present in room. Pt is able to complete bed mobility with mod I, STS with supervision, & ambulate with RW & supervision<>CGA. Pt is able to increase ambulation distances on this date with encouragement but is fairly fatigued after gait. Pt would benefit from ongoing PT services to progress mobility as able.    If plan is discharge home, recommend the following: A little help with walking and/or transfers;A little help with bathing/dressing/bathroom;Assistance with cooking/housework;Assist for transportation;Help with stairs or ramp for entrance   Can travel by private vehicle        Equipment Recommendations  None recommended by PT    Recommendations for Other Services       Precautions / Restrictions Precautions Precautions: Fall Precaution/Restrictions Comments: watch O2 (3 L O2 at baseline) Restrictions Weight Bearing Restrictions Per Provider Order: No     Mobility  Bed Mobility Overal bed mobility: Modified Independent Bed Mobility: Supine to Sit     Supine to sit: Modified independent (Device/Increase time), HOB elevated, Used rails (exit L side of bed)          Transfers Overall transfer level: Needs assistance Equipment used: Rolling walker (2 wheels) Transfers: Sit to/from Stand Sit to Stand: Supervision                Ambulation/Gait Ambulation/Gait assistance: Contact guard  assist, Supervision Gait Distance (Feet): 120 Feet Assistive device: Rolling walker (2 wheels) Gait Pattern/deviations: Decreased step length - right, Decreased step length - left, Decreased stride length, Decreased dorsiflexion - right, Decreased dorsiflexion - left Gait velocity: decreased     General Gait Details: cuing for increased dorsiflexion & heel strike with poor return demo, cuing for upright posture vs leaning on RW   Stairs             Wheelchair Mobility     Tilt Bed    Modified Rankin (Stroke Patients Only)       Balance Overall balance assessment: Needs assistance Sitting-balance support: No upper extremity supported, Feet supported Sitting balance-Leahy Scale: Good     Standing balance support: During functional activity, Bilateral upper extremity supported, Reliant on assistive device for balance Standing balance-Leahy Scale: Fair                              Hotel manager: No apparent difficulties  Cognition Arousal: Alert Behavior During Therapy: WFL for tasks assessed/performed                           PT - Cognition Comments: some slow processing   Following commands impaired: Follows one step commands with increased time    Cueing Cueing Techniques: Verbal cues, Gestural cues  Exercises      General Comments General comments (skin integrity, edema, etc.): Pt on 3L/min via nasal cannula with lowest SpO2 of 86% after gait, increased to >/= 90% with rest &  cuing for pursed lip breathing.      Pertinent Vitals/Pain Pain Assessment Pain Assessment: No/denies pain    Home Living                          Prior Function            PT Goals (current goals can now be found in the care plan section) Acute Rehab PT Goals Patient Stated Goal: to get better PT Goal Formulation: With patient Time For Goal Achievement: 03/17/24 Potential to Achieve Goals: Good Progress  towards PT goals: Progressing toward goals    Frequency    Min 2X/week      PT Plan      Co-evaluation              AM-PAC PT "6 Clicks" Mobility   Outcome Measure  Help needed turning from your back to your side while in a flat bed without using bedrails?: None Help needed moving from lying on your back to sitting on the side of a flat bed without using bedrails?: A Little Help needed moving to and from a bed to a chair (including a wheelchair)?: A Little Help needed standing up from a chair using your arms (e.g., wheelchair or bedside chair)?: A Little Help needed to walk in hospital room?: A Little Help needed climbing 3-5 steps with a railing? : A Lot 6 Click Score: 18    End of Session Equipment Utilized During Treatment: Oxygen Activity Tolerance: Patient tolerated treatment well;Patient limited by fatigue Patient left: in chair;with chair alarm set;with call bell/phone within reach;with family/visitor present Nurse Communication: Mobility status PT Visit Diagnosis: Unsteadiness on feet (R26.81);Other abnormalities of gait and mobility (R26.89);Muscle weakness (generalized) (M62.81);Difficulty in walking, not elsewhere classified (R26.2)     Time: 9147-8295 PT Time Calculation (min) (ACUTE ONLY): 16 min  Charges:    $Therapeutic Activity: 8-22 mins PT General Charges $$ ACUTE PT VISIT: 1 Visit                     Emaline Handsome, PT, DPT 03/07/24, 3:34 PM  Venetta Gill 03/07/2024, 3:33 PM

## 2024-03-07 NOTE — Plan of Care (Signed)

## 2024-03-07 NOTE — Progress Notes (Signed)
 Rounding Note    Patient Name: Jerry [name]. Date of Encounter: 03/07/2024  [facility] Cardiologist: Cody Das, MD   Subjective   No issues overnight. Sinus rhythm with 3 beat run of NSVT.  Net negative 1.5L yesterday and another 1.6L today.  Potassium 5.2 today.  Creatinine stable at 1.88.  Inpatient Medications    Scheduled Meds:  amLODipine   10 mg Oral Daily   apixaban   5 mg Oral BID   atorvastatin   20 mg Oral Daily   budesonide  (PULMICORT ) nebulizer solution  0.25 mg Nebulization BID   cyanocobalamin   250 mcg Oral Daily   escitalopram   10 mg Oral Daily   guaiFENesin   5 mL Oral Q6H   insulin  aspart  0-6 Units Subcutaneous TID WC   ipratropium-albuterol   3 mL Nebulization BID   memantine   10 mg Oral BID   pantoprazole   40 mg Oral BID   sodium chloride  flush  3 mL Intravenous Q12H   sodium zirconium cyclosilicate   10 g Oral Daily   Continuous Infusions:  PRN Meds: acetaminophen  **OR** acetaminophen , albuterol , ALPRAZolam , melatonin, OLANZapine , ondansetron  (ZOFRAN ) IV   Vital Signs    Vitals:   03/07/24 0800 03/07/24 0828 03/07/24 0829 03/07/24 0900  BP: 125/75     Pulse: 84 85  94  Resp: 20 (!) 23  17  Temp:    98.2 F (36.8 C)  TempSrc:      SpO2: 97% 98% 97% 92%  Weight:      Height:        Intake/Output Summary (Last 24 hours) at 03/07/2024 1001 Last data filed at 03/07/2024 0841 Gross per 24 hour  Intake 480 ml  Output 2450 ml  Net -1970 ml      03/06/2024    4:25 AM 03/05/2024    5:00 AM 03/04/2024    5:00 AM  Last 3 Weights  Weight (lbs) 181 lb 7 oz 180 lb 8.9 oz 184 lb 11.9 oz  Weight (kg) 82.3 kg 81.9 kg 83.8 kg      Telemetry    Sinus rhythm with 3 beat run of NSVT- Personally Reviewed  ECG    No new tracings - Personally Reviewed  Physical Exam   GEN: No acute distress.   Neck: No JVD Cardiac: RRR, no murmurs, rubs, or gallops.  Respiratory: lung sounds seem improved GI: Soft, nontender, non-distended   MS: No edema; No deformity. Neuro:  Nonfocal  Psych: Normal affect   Labs    High Sensitivity Troponin:   Recent Labs  Lab 02/15/24 0619 03/02/24 0026 03/02/24 0159 03/02/24 0900 03/02/24 1139  TROPONINIHS 22* 23* 42* 55* 42*     Chemistry Recent Labs  Lab 03/01/24 2322 03/02/24 0026 03/03/24 0444 03/04/24 0802 03/05/24 0447 03/06/24 0441 03/07/24 0453  NA 141   < > 142   < > 143 140 139  K 4.7   < > 5.2*   < > 4.6 4.9 5.2*  CL 108   < > 108   < > 107 106 105  CO2 24   < > 25   < > 28 27 25   GLUCOSE 139*   < > 155*   < > 119* 100* 90  BUN 22   < > 30*   < > 28* 31* 34*  CREATININE 2.64*   < > 2.27*   < > 2.13* 1.88* 1.88*  CALCIUM  8.9   < > 8.7*   < > 9.0 8.9 9.1  MG 1.6*  --   --   --   --   --   --   PROT 6.1*  --  5.3*  --   --   --   --   ALBUMIN 3.1*  --  2.6*  --   --   --   --   AST 22  --  17  --   --   --   --   ALT 18  --  15  --   --   --   --   ALKPHOS 61  --  55  --   --   --   --   BILITOT 0.5  --  0.4  --   --   --   --   GFRNONAA 24*   < > 28*   < > 31* 36* 36*  ANIONGAP 9   < > 9   < > 8 7 9    < > = values in this interval not displayed.    Lipids No results for input(s): "CHOL", "TRIG", "HDL", "LABVLDL", "LDLCALC", "CHOLHDL" in the last 168 hours.  Hematology Recent Labs  Lab 03/05/24 0447 03/06/24 0441 03/07/24 0453  WBC 10.1 10.1 8.2  RBC 3.34* 3.32* 3.27*  HGB 8.1* 8.3* 8.0*  HCT 27.9* 27.6* 27.1*  MCV 83.5 83.1 82.9  MCH 24.3* 25.0* 24.5*  MCHC 29.0* 30.1 29.5*  RDW 18.8* 18.6* 18.4*  PLT 216 251 264   Thyroid  No results for input(s): "TSH", "FREET4" in the last 168 hours.  BNP Recent Labs  Lab 03/02/24 0026  BNP 111.1*    DDimer No results for input(s): "DDIMER" in the last 168 hours.   Radiology    VAS US  LOWER EXTREMITY VENOUS (DVT) Result Date: 03/06/2024  Lower Venous DVT Study Patient Name:  Winfred Iiams.  Date of Exam:   03/05/2024 Medical Rec #: [id number]          Accession #:    [id number] Date of Birth:  22-Jan-1941           Patient Gender: M Patient Age:   83 years Exam Location:  [facility] Procedure:      VAS US  LOWER EXTREMITY VENOUS (DVT) Referring Phys: Kimberly Penna --------------------------------------------------------------------------------  Indications: Recent DVT - S/P IVC filter with worsening hypoxia-assess for new clot burden.  Comparison Study: 02/22/24 - Positive DVT in left popliteal vein. Performing Technologist: Franky Ivanoff Sturdivant-Jones RDMS, RVT  Examination Guidelines: A complete evaluation includes B-mode imaging, spectral Doppler, color Doppler, and power Doppler as needed of all accessible portions of each vessel. Bilateral testing is considered an integral part of a complete examination. Limited examinations for reoccurring indications may be performed as noted. The reflux portion of the exam is performed with the patient in reverse Trendelenburg.  +---------+---------------+---------+-----------+----------+--------------+ RIGHT    CompressibilityPhasicitySpontaneityPropertiesThrombus Aging +---------+---------------+---------+-----------+----------+--------------+ CFV      Full           Yes      Yes                                 +---------+---------------+---------+-----------+----------+--------------+ SFJ      Full                                                        +---------+---------------+---------+-----------+----------+--------------+  FV Prox  Full                                                        +---------+---------------+---------+-----------+----------+--------------+ FV Mid   Full                                                        +---------+---------------+---------+-----------+----------+--------------+ FV DistalFull                                                        +---------+---------------+---------+-----------+----------+--------------+ PFV      Full                                                         +---------+---------------+---------+-----------+----------+--------------+ POP      Full           Yes      Yes                                 +---------+---------------+---------+-----------+----------+--------------+ PTV      Full                                                        +---------+---------------+---------+-----------+----------+--------------+ PERO     Full                                                        +---------+---------------+---------+-----------+----------+--------------+   +---------+---------------+---------+-----------+----------+--------------+ LEFT     CompressibilityPhasicitySpontaneityPropertiesThrombus Aging +---------+---------------+---------+-----------+----------+--------------+ CFV      Full           Yes      Yes                                 +---------+---------------+---------+-----------+----------+--------------+ SFJ      Full                                                        +---------+---------------+---------+-----------+----------+--------------+ FV Prox  Full                                                        +---------+---------------+---------+-----------+----------+--------------+  FV Mid   Full                                                        +---------+---------------+---------+-----------+----------+--------------+ FV DistalFull                                                        +---------+---------------+---------+-----------+----------+--------------+ PFV      Full                                                        +---------+---------------+---------+-----------+----------+--------------+ POP      Full           Yes      Yes                                 +---------+---------------+---------+-----------+----------+--------------+ PTV      Full                                                         +---------+---------------+---------+-----------+----------+--------------+ PERO     Full                                                        +---------+---------------+---------+-----------+----------+--------------+     Summary: BILATERAL: - No evidence of deep vein thrombosis seen in the lower extremities, bilaterally. -No evidence of popliteal cyst, bilaterally.  LEFT: - Findings suggest resolution of previously noted thrombus.  *See table(s) above for measurements and observations. Electronically signed by Runell Countryman on 03/06/2024 at 5:22:59 PM.    Final     Cardiac Studies   Limited echo 03/03/2024 1. Left ventricular ejection fraction, by estimation, is 55 to 60%. The  left ventricle has normal function. The left ventricle has no regional  wall motion abnormalities. There is mild concentric left ventricular  hypertrophy. Left ventricular diastolic  parameters are indeterminate.   2. D-shaped septum suggestive of RV pressure/volume overload. Right  ventricular systolic function is moderately reduced. The right ventricular  size is moderately enlarged. There is severely elevated pulmonary artery  systolic pressure. The estimated  right ventricular systolic pressure is 68.5 mmHg.   3. The mitral valve was not assessed.   4. The inferior vena cava is normal in size with <50% respiratory  variability, suggesting right atrial pressure of 8 mmHg.   5. A small pericardial effusion is present. The pericardial effusion is  localized near the right ventricle.   6. Limited echo   Patient Profile     83 y.o. male with recent right heart failure in the setting of pulmonary emboli now  with acute on chronic right heart strain with severe pulmonary hypertension and volume overload. His anticoagulation was stopped due to GI bleeding which ultimately was cauterized. An IVC filter was placed. He now presents with acute on chronic right heart failure which could represent worsening PE burden,  now on empiric anticoagulation.   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 - admitted with acute on chronic respiratory failure with increasing O2 requirements and SOB - renal function precluded CTA PE protocol to determine worsening clot burden, now on empiric IV heparin  - repeat echo with D-shaped septum and moderately reduced RV function - has been diuresing on IV lasix  40 mg BID --> reduced to daily dosing yesterday - sCr down to 1.88 today, baseline probably around 2 - weight is 181 lbs down from 187 lbs at admission - continues to have DOE, likely multifactorial - will switch to oral torsemide  40 mg daily today  Mild nonobstructive CAD - mild and flat troponin elevation likely demand ischemia - no plans for ischemic evaluation this admission  Hypertension - 10 mg amlodipine , home lopressor  not yet started  Sinus tachycardia - HR in the 90s, likely compensatory - episodes of nonconducted p waves in the setting of flipped p waves - ?wandering pacemaker - rare NSVT - ok to start low dose Toprol  XL 12.5 mg daily.  For questions or updates, please contact [facility] Please consult [email or web address] for contact info under   Hazle Lites, MD, [facility], FNLA, FACP    [facility]  Medical Director of the [facility] Lipid [facility] &  Cardiovascular Risk Reduction Clinic Diplomate of the American Board of Clinical Lipidology Attending Cardiologist  Direct Dial: [phone]  Fax: [phone]  Website:  [email or web address]  Hazle Lites, MD  03/07/2024, 10:01 AM

## 2024-03-08 ENCOUNTER — Other Ambulatory Visit (HOSPITAL_COMMUNITY): Payer: Self-pay

## 2024-03-08 DIAGNOSIS — I21A1 Myocardial infarction type 2: Secondary | ICD-10-CM

## 2024-03-08 DIAGNOSIS — I2694 Multiple subsegmental pulmonary emboli without acute cor pulmonale: Secondary | ICD-10-CM | POA: Diagnosis not present

## 2024-03-08 DIAGNOSIS — J9621 Acute and chronic respiratory failure with hypoxia: Secondary | ICD-10-CM | POA: Diagnosis not present

## 2024-03-08 DIAGNOSIS — I50810 Right heart failure, unspecified: Secondary | ICD-10-CM | POA: Diagnosis not present

## 2024-03-08 DIAGNOSIS — I289 Disease of pulmonary vessels, unspecified: Secondary | ICD-10-CM

## 2024-03-08 LAB — CBC
HCT: 28.3 % — ABNORMAL LOW (ref 39.0–52.0)
Hemoglobin: 8.4 g/dL — ABNORMAL LOW (ref 13.0–17.0)
MCH: 24.3 pg — ABNORMAL LOW (ref 26.0–34.0)
MCHC: 29.7 g/dL — ABNORMAL LOW (ref 30.0–36.0)
MCV: 82 fL (ref 80.0–100.0)
Platelets: 278 10*3/uL (ref 150–400)
RBC: 3.45 MIL/uL — ABNORMAL LOW (ref 4.22–5.81)
RDW: 18.1 % — ABNORMAL HIGH (ref 11.5–15.5)
WBC: 8.7 10*3/uL (ref 4.0–10.5)
nRBC: 0 % (ref 0.0–0.2)

## 2024-03-08 LAB — BASIC METABOLIC PANEL WITH GFR
Anion gap: 9 (ref 5–15)
BUN: 40 mg/dL — ABNORMAL HIGH (ref 8–23)
CO2: 29 mmol/L (ref 22–32)
Calcium: 9.1 mg/dL (ref 8.9–10.3)
Chloride: 101 mmol/L (ref 98–111)
Creatinine, Ser: 2.16 mg/dL — ABNORMAL HIGH (ref 0.61–1.24)
GFR, Estimated: 30 mL/min — ABNORMAL LOW (ref >60)
Glucose, Bld: 102 mg/dL — ABNORMAL HIGH (ref 70–99)
Potassium: 4.8 mmol/L (ref 3.5–5.1)
Sodium: 139 mmol/L (ref 135–145)

## 2024-03-08 LAB — GLUCOSE, CAPILLARY: Glucose-Capillary: 145 mg/dL — ABNORMAL HIGH (ref 70–99)

## 2024-03-08 MED ORDER — BISACODYL 10 MG RE SUPP
10.0000 mg | Freq: Once | RECTAL | Status: AC
Start: 2024-03-08 — End: 2024-03-08
  Administered 2024-03-08: 10 mg via RECTAL
  Filled 2024-03-08: qty 1

## 2024-03-08 MED ORDER — TORSEMIDE 20 MG PO TABS
40.0000 mg | ORAL_TABLET | Freq: Every day | ORAL | 0 refills | Status: DC
  Filled 2024-03-08: qty 60, 30d supply, fill #0

## 2024-03-08 MED ORDER — APIXABAN 5 MG PO TABS
5.0000 mg | ORAL_TABLET | Freq: Two times a day (BID) | ORAL | 1 refills | Status: DC
  Filled 2024-03-08: qty 60, 30d supply, fill #0

## 2024-03-08 MED ORDER — POLYETHYLENE GLYCOL 3350 17 G PO PACK
17.0000 g | PACK | Freq: Two times a day (BID) | ORAL | Status: DC
  Administered 2024-03-08: 17 g via ORAL
  Filled 2024-03-08: qty 1

## 2024-03-08 MED ORDER — SENNOSIDES-DOCUSATE SODIUM 8.6-50 MG PO TABS
2.0000 | ORAL_TABLET | Freq: Once | ORAL | Status: AC
  Administered 2024-03-08: 2 via ORAL
  Filled 2024-03-08: qty 2

## 2024-03-08 MED ORDER — METOPROLOL SUCCINATE ER 25 MG PO TB24
12.5000 mg | ORAL_TABLET | Freq: Every day | ORAL | 0 refills | Status: DC
  Filled 2024-03-08: qty 30, 60d supply, fill #0

## 2024-03-08 NOTE — Progress Notes (Signed)
 DISCHARGE NOTE HOME Jerry [name]. to be discharged Home per MD order. Discussed prescriptions and follow up appointments with the patient. Prescriptions given to patient; medication list explained in detail. Patient verbalized understanding.  Skin clean, dry and intact without evidence of skin break down, no evidence of skin tears noted. IV catheter discontinued intact. Site without signs and symptoms of complications. Dressing and pressure applied. Pt denies pain at the site currently. No complaints noted.  Patient free of lines, drains, and wounds.   An After Visit Summary (AVS) was printed and given to the patient. Patient escorted via wheelchair, and discharged home via private auto.  Tonda Francisco, RN

## 2024-03-08 NOTE — TOC Transition Note (Signed)
 Transition of Care [facility]) - Discharge Note   Patient Details  Name: Jerry [name]. MRN: [id number] Date of Birth: [birth year]  [facility] of [facility]) CM/SW Contact:  Omie Bickers, RN Phone Number: 03/08/2024, 9:27 AM   Clinical Narrative:     Rise Cheng of DC today. Will resume HH services.  No DME needs, patient has home oxygen, RW and Rollator at home.    Final next level of care: Home w Home Health Services Barriers to Discharge: No Barriers Identified   Patient Goals and CMS Choice Patient states their goals for this hospitalization and ongoing recovery are:: return home w [facility]          Discharge Placement                       Discharge Plan and Services Additional resources added to the After Visit Summary for                            [facility] Arranged: PT, OT HH Agency: [facility] Date [facility] Agency Contacted: 03/08/24 Time HH Agency Contacted: [phone] Representative spoke with at [facility] Agency: Randel Buss  Social Drivers of Health (SDOH) Interventions SDOH Screenings   Food Insecurity: No Food Insecurity (03/04/2024)  Housing: Low Risk  (03/04/2024)  Transportation Needs: No Transportation Needs (03/04/2024)  Utilities: Not At Risk (03/04/2024)  Social Connections: Socially Integrated (03/04/2024)  Tobacco Use: Low Risk  (02/22/2024)     Readmission Risk Interventions    02/18/2024    2:20 PM 06/27/2023   12:12 PM  Readmission Risk Prevention Plan  Transportation Screening Complete Complete  PCP or Specialist Appt within 5-7 Days  Complete  PCP or Specialist Appt within 3-5 Days Complete   Home Care Screening  Complete  Medication Review (RN CM)  Complete  HRI or Home Care Consult Complete   Palliative Care Screening Not Applicable   Medication Review (RN Care Manager) Complete

## 2024-03-08 NOTE — Discharge Summary (Signed)
 PATIENT DETAILS Name: Jerry [name]. Age: 83 y.o. Sex: male Date of Birth: 27-Feb-1941 MRN: [id number]. Admitting Physician: Roxana Copier, DO NWG:[id number], Jullie Oiler, MD  Admit Date: 03/01/2024 Discharge date: 03/08/2024  Recommendations for Outpatient Follow-up:  Follow up with PCP in 1-2 weeks Please obtain CMP/CBC in one week Needs outpatient sleep study Please see below regarding anticoagulation  Admitted From:  Home  Disposition: Home health   Discharge Condition: good  CODE STATUS:   Code Status: Full Code   Diet recommendation:  Diet Order             Diet - low sodium heart healthy           Diet Carb Modified           Diet Carb Modified Fluid consistency: Thin; Room service appropriate? Yes  Diet effective now                    Brief Summary: Patient is a 83 y.o.  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  It was felt that he had worsening hypoxia/right-sided heart failure due to increased clot burden from recent DVT/PE in spite having an IVC filter.  Patient was started on IV Lasix /IV heparin  with significant clinical improvement-thankfully he has not had any further GI bleeding.   Significant events: 4/19>> worsening shortness of breath-acute on chronic hypoxic respiratory failure-admit to [facility].   Significant studies: 4/20>> CT chest: No acute findings. 4/21>> Limited echo: EF  55-60%, RV systolic function moderately reduced-RVSP 68.5. 4/23>> B/L lower extremity Doppler: Resolution of previously seen thrombus-no DVT bilaterally.   Significant microbiology data: 4/19>> COVID/influenza/RSV PCR: Negative   Procedures: None   Consults: Cardiology  Brief Hospital Course: Acute chronic hypoxic respiratory failure secondary to pulmonary hypertension with right heart failure due to presumed pulmonary embolism with worsening clot burden in spite of IVC filter placement on 4/11. Treated with IV Lasix /IV heparin  with significant clinical improvement-Down to usual baseline of 3 L of oxygen by the day of discharge.  Has been converted from IV heparin  to Eliquis .  Thankfully no bleeding noted throughout this hospitalization-Hb remains stable. Very difficult situation-unable to perform CTA PE study due to renal function.  Recent hospitalization for recurrent GI bleeding in the setting of initiation of anticoagulation for PE/DVT-he required 4 units of PRBC-an IVC filter placed.  Unfortunately it looks like he still developed worsening clot burden in spite of IVC filter placement-causing worsening hypoxemia and right heart failure. Since he tolerated IV heparin  for for 4-5 days after extensive discussion of risks versus benefits with spouse Adriana Hopping over the phone on 4/24-including recurrent GI bleeding that could be potentially life-threatening//disabling/cause recurrent hospitalization-family/patient elected to continue anticoagulation.  Patient/family aware of the need to continue close monitoring for GI bleeding--please follow CBC closely in the outpatient setting.   Recent diagnosis of PE/DVT (hospitalized from 4/4-4/7-started on anticoagulation)-but subsequently presented with GI bleeding with acute blood loss anemia-hypovolemic shock requiring hospitalization from 4/10-4/13-required 4 units of PRBC-IVC filter placed-anticoagulation discontinued. See above-regarding discussion with  patient/family regarding risks/benefits of anticoagulation.   AKI on CKD stage IIIb  AKI presumably hemodynamically mediated-possibly due to cardiorenal syndrome. Creatinine overall improved and close to baseline at this point Avoid nephrotoxic agents and monitor closely.   Hyperkalemia Was mild-resolved with Lokelma  Being discharged on diuretics PCP to follow electrolytes closely.   Gastrointestinal AVMs-recent history of upper GI bleeding No bleeding for the past few days while on anticoagulation-but recently hospitalized for GI bleed after being started on anticoagulation for PE/DVT. Maintained on PPI-watch closely-follow CBCs closely in the outpatient setting. Difficult situation regarding anticoagulation-see above.   History of nonobstructive CAD No anginal symptoms   HTN BP relatively stable Continue amlodipine /metoprolol .   Asthma without exacerbation Pulmonary hypertension Chronic hypoxic respiratory failure on 3 L of oxygen at baseline OSA Continue bronchodilators Not on home CPAP-needs outpatient sleep study.   DM-2 (A1c 6.2 on 2/13) CBGs stable on SSI while inpatient Appears to be diet controlled as an outpatient  HLD Statin    Dementia Mood disorder Delirium precautions-did not have any major issues of delirium during this hospitalization. Continue Namenda /olanzapine    5 mm middle lobe nodule Incidental finding on CT chest. Stable per radiology report Continue outpatient monitoring.   BMI: Estimated body mass index is 25.31 kg/m as calculated from the following:   Height as of this encounter: 5\' 11"  (1.803 m).   Weight as of this encounter: 82.3 kg.   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 [facility])   Discharge  Instructions:  Activity:  As tolerated with Full fall precautions use walker/cane & assistance as needed  Discharge Instructions     Call MD for:   Complete by: As directed    Hematochezia/melena/hematemesis   Call MD for:  difficulty breathing, headache or visual disturbances   Complete by: As directed    Diet - low sodium heart healthy   Complete by: As directed    Diet Carb Modified   Complete by: As directed    Discharge instructions   Complete by: As directed    Follow with Primary MD  Yolanda Hence, MD in 1-2 weeks  Please get a complete blood count and chemistry panel checked by your Primary MD at your next visit, and again as instructed by your Primary MD.  Get Medicines reviewed and adjusted: Please take all your medications with you for your next visit with your Primary MD  Laboratory/radiological data: Please request your Primary MD to go over all hospital tests and procedure/radiological results at the follow up, please ask your Primary MD to get all Hospital records sent to his/her office.  In some cases, they will be blood work, cultures and biopsy results pending at the time of your discharge. Please request that your primary care M.D. follows up on these results.  Also Note the following: If you experience worsening of your admission symptoms, develop shortness of breath, life threatening emergency, suicidal or homicidal thoughts you must seek medical attention immediately by calling 911 or calling your MD immediately  if symptoms less severe.  You must read complete instructions/literature along with all the possible adverse reactions/side effects for all the Medicines you take and that have been prescribed to you. Take any new Medicines after you have completely understood and accpet all the possible adverse reactions/side effects.   Do not drive  when taking Pain medications or sleeping medications (Benzodaizepines)  Do not take more than prescribed Pain,  Sleep and Anxiety Medications. It is not advisable to combine anxiety,sleep and pain medications without talking with your primary care practitioner  Special Instructions: If you have smoked or chewed Tobacco  in the last 2 yrs please stop smoking, stop any regular Alcohol  and or any Recreational drug use.  Wear Seat belts while driving.  Please note: You were cared for by a hospitalist during your hospital stay. Once you are discharged, your primary care physician will handle any further medical issues. Please note that NO REFILLS for any discharge medications will be authorized once you are discharged, as it is imperative that you return to your primary care physician (or establish a relationship with a primary care physician if you do not have one) for your post hospital discharge needs so that they can reassess your need for medications and monitor your lab values.   Increase activity slowly   Complete by: As directed       Allergies as of 03/08/2024   No Known Allergies      Medication List     STOP taking these medications    furosemide  20 MG tablet Commonly known as: LASIX    metoprolol  tartrate 50 MG tablet Commonly known as: LOPRESSOR        TAKE these medications    albuterol  108 (90 Base) MCG/ACT inhaler Commonly known as: VENTOLIN  HFA Inhale 2 puffs into the lungs every 6 (six) hours as needed for wheezing or shortness of breath.   ALPRAZolam  0.5 MG tablet Commonly known as: XANAX  Take 0.5 mg by mouth 2 (two) times daily as needed for anxiety.   amLODipine  10 MG tablet Commonly known as: NORVASC  Take 10 mg by mouth daily.   apixaban  5 MG Tabs tablet Commonly known as: ELIQUIS  Take 1 tablet (5 mg total) by mouth 2 (two) times daily.   atorvastatin  20 MG tablet Commonly known as: LIPITOR Take 1 tablet (20 mg total) by mouth daily.   Chlorphen-PE-Acetaminophen  4-10-325 MG Tabs Take 1 tablet by mouth daily as needed (For sinus).   Cholecalciferol  25 MCG  (1000 UT) tablet Take 1,000 Units by mouth daily.   Dulera  200-5 MCG/ACT Aero Generic drug: mometasone -formoterol  Inhale 2 puffs into the lungs 2 (two) times daily.   escitalopram  10 MG tablet Commonly known as: LEXAPRO  Take 10 mg by mouth daily.   Folivane-Plus Caps Take 1 capsule by mouth daily.   ipratropium-albuterol  0.5-2.5 (3) MG/3ML Soln Commonly known as: DUONEB Inhale 3 mLs into the lungs every 2 (two) hours as needed (SOB/Wheezing).   memantine  5 MG tablet Commonly known as: NAMENDA  Take 10 mg by mouth 2 (two) times daily.   metoprolol  succinate 25 MG 24 hr tablet Commonly known as: TOPROL -XL Take 0.5 tablets (12.5 mg total) by mouth at bedtime.   OLANZapine  2.5 MG tablet Commonly known as: ZyPREXA  Take 1 tablet (2.5 mg total) by mouth 2 (two) times daily as needed (agitation).   pantoprazole  40 MG tablet Commonly known as: Protonix  Take 1 tablet (40 mg total) by mouth 2 (two) times daily.   Torsemide  40 MG Tabs Take 40 mg by mouth daily.   VITAMIN B12 PO Take 1 tablet by mouth daily.        Follow-up Information     Yolanda Hence, MD. Schedule an appointment as soon as possible for a visit in 1 week(s).   Specialty: Internal Medicine Contact information: [address]  [address] [place] [address] [phone]         Cody Das, MD Follow up on 06/04/2024.   Specialties: Cardiology, Radiology Contact information: [address] [place] [address] [phone]                No Known Allergies   Other Procedures/Studies: VAS US  LOWER EXTREMITY VENOUS (DVT) Result Date: 03/06/2024  Lower Venous DVT Study Patient Name:  Jerry [name].  Date of Exam:   03/05/2024 Medical Rec #: [id number]          Accession #:    [id number] Date of Birth: 20-Apr-1941           Patient Gender: M Patient Age:   14 years Exam Location:  [facility] Procedure:      VAS US  LOWER EXTREMITY VENOUS (DVT)  Referring Phys: Kimberly Penna --------------------------------------------------------------------------------  Indications: Recent DVT - S/P IVC filter with worsening hypoxia-assess for new clot burden.  Comparison Study: 02/22/24 - Positive DVT in left popliteal vein. Performing Technologist: Franky Ivanoff Sturdivant-Jones RDMS, RVT  Examination Guidelines: A complete evaluation includes B-mode imaging, spectral Doppler, color Doppler, and power Doppler as needed of all accessible portions of each vessel. Bilateral testing is considered an integral part of a complete examination. Limited examinations for reoccurring indications may be performed as noted. The reflux portion of the exam is performed with the patient in reverse Trendelenburg.  +---------+---------------+---------+-----------+----------+--------------+ RIGHT    CompressibilityPhasicitySpontaneityPropertiesThrombus Aging +---------+---------------+---------+-----------+----------+--------------+ CFV      Full           Yes      Yes                                 +---------+---------------+---------+-----------+----------+--------------+ SFJ      Full                                                        +---------+---------------+---------+-----------+----------+--------------+ FV Prox  Full                                                        +---------+---------------+---------+-----------+----------+--------------+ FV Mid   Full                                                        +---------+---------------+---------+-----------+----------+--------------+ FV DistalFull                                                        +---------+---------------+---------+-----------+----------+--------------+ PFV      Full                                                        +---------+---------------+---------+-----------+----------+--------------+  POP      Full           Yes      Yes                                  +---------+---------------+---------+-----------+----------+--------------+ PTV      Full                                                        +---------+---------------+---------+-----------+----------+--------------+ PERO     Full                                                        +---------+---------------+---------+-----------+----------+--------------+   +---------+---------------+---------+-----------+----------+--------------+ LEFT     CompressibilityPhasicitySpontaneityPropertiesThrombus Aging +---------+---------------+---------+-----------+----------+--------------+ CFV      Full           Yes      Yes                                 +---------+---------------+---------+-----------+----------+--------------+ SFJ      Full                                                        +---------+---------------+---------+-----------+----------+--------------+ FV Prox  Full                                                        +---------+---------------+---------+-----------+----------+--------------+ FV Mid   Full                                                        +---------+---------------+---------+-----------+----------+--------------+ FV DistalFull                                                        +---------+---------------+---------+-----------+----------+--------------+ PFV      Full                                                        +---------+---------------+---------+-----------+----------+--------------+ POP      Full           Yes      Yes                                 +---------+---------------+---------+-----------+----------+--------------+  PTV      Full                                                        +---------+---------------+---------+-----------+----------+--------------+ PERO     Full                                                         +---------+---------------+---------+-----------+----------+--------------+     Summary: BILATERAL: - No evidence of deep vein thrombosis seen in the lower extremities, bilaterally. -No evidence of popliteal cyst, bilaterally.  LEFT: - Findings suggest resolution of previously noted thrombus.  *See table(s) above for measurements and observations. Electronically signed by Runell Countryman on 03/06/2024 at 5:22:59 PM.    Final    ECHOCARDIOGRAM LIMITED Result Date: 03/03/2024    ECHOCARDIOGRAM LIMITED REPORT   Patient Name:   Jerry [name]. Date of Exam: 03/03/2024 Medical Rec #:  [id number]         Height:       71.0 in Accession #:    [id number]        Weight:       185.0 lb Date of Birth:  [birth year]          BSA:          2.040 m Patient Age:    82 years          BP:           135/79 mmHg Patient Gender: M                 HR:           96 bpm. Exam Location:  Inpatient Procedure: Limited Echo, Cardiac Doppler and Color Doppler (Both Spectral and            Color Flow Doppler were utilized during procedure). Indications:    Dyspnea R06.00  History:        Patient has prior history of Echocardiogram examinations, most                 recent 02/22/2024. Signs/Symptoms:Dyspnea and Shortness of                 Breath; Risk Factors:Hypertension.  Sonographer:    Astrid Blamer Referring Phys: 61 DAWO[address] S ELGERGAWY IMPRESSIONS  1. Left ventricular ejection fraction, by estimation, is 55 to 60%. The left ventricle has normal function. The left ventricle has no regional wall motion abnormalities. There is mild concentric left ventricular hypertrophy. Left ventricular diastolic parameters are indeterminate.  2. D-shaped septum suggestive of RV pressure/volume overload. Right ventricular systolic function is moderately reduced. The right ventricular size is moderately enlarged. There is severely elevated pulmonary artery systolic pressure. The estimated right ventricular systolic pressure is 68.5 mmHg.  3. The mitral valve  was not assessed.  4. The inferior vena cava is normal in size with <50% respiratory variability, suggesting right atrial pressure of 8 mmHg.  5. A small pericardial effusion is present. The pericardial effusion is localized near the right ventricle.  6. Limited echo FINDINGS  Left Ventricle: Left ventricular ejection fraction, by estimation, is 55 to 60%. The left  ventricle has normal function. The left ventricle has no regional wall motion abnormalities. The left ventricular internal cavity size was normal in size. There is  mild concentric left ventricular hypertrophy. Left ventricular diastolic parameters are indeterminate. Right Ventricle: D-shaped septum suggestive of RV pressure/volume overload. The right ventricular size is moderately enlarged. Right ventricular systolic function is moderately reduced. There is severely elevated pulmonary artery systolic pressure. The tricuspid regurgitant velocity is 3.89 m/s, and with an assumed right atrial pressure of 8 mmHg, the estimated right ventricular systolic pressure is 68.5 mmHg. Pericardium: A small pericardial effusion is present. The pericardial effusion is localized near the right ventricle. Mitral Valve: The mitral valve was not assessed. Tricuspid Valve: The tricuspid valve is normal in structure. Tricuspid valve regurgitation is mild. Aorta: The aortic root is normal in size and structure. Venous: The inferior vena cava is normal in size with less than 50% respiratory variability, suggesting right atrial pressure of 8 mmHg. LEFT VENTRICLE PLAX 2D LVIDd:         3.40 cm LVIDs:         1.80 cm LV PW:         1.60 cm LV IVS:        1.30 cm LVOT diam:     2.00 cm LVOT Area:     3.14 cm  RIGHT VENTRICLE             IVC RV Basal diam:  4.10 cm     IVC diam: 1.90 cm RV Mid diam:    3.60 cm RV S prime:     12.90 cm/s TAPSE (M-mode): 1.5 cm  AORTA Ao Root diam: 3.00 cm TRICUSPID VALVE TR Peak grad:   60.5 mmHg TR Vmax:        389.00 cm/s  SHUNTS Systemic Diam:  2.00 cm Dalton McleanMD Electronically signed by Archer Bear Signature Date/Time: 03/03/2024/10:55:41 AM    Final    CT CHEST WO CONTRAST Result Date: 03/02/2024 CLINICAL DATA:  Streaky atelectasis or infiltrate in the left base on chest x-ray, small left effusion. Respiratory illness with suspected complication. Shortness of breath all day long yesterday. EXAM: CT CHEST WITHOUT CONTRAST TECHNIQUE: Multidetector CT imaging of the chest was performed following the standard protocol without IV contrast. RADIATION DOSE REDUCTION: This exam was performed according to the departmental dose-optimization program which includes automated exposure control, adjustment of the mA and/or kV according to patient size and/or use of iterative reconstruction technique. COMPARISON:  Portable chest yesterday, portable chest 02/21/2024, CTA chest 02/15/2024, and chest CT without contrast 01/27/2024. FINDINGS: Cardiovascular: The heart is slightly enlarged but smaller than previously. Small pericardial effusion is unchanged. There are three-vessel coronary calcifications, aortic atherosclerosis. No aortic aneurysm is seen. No dilatation of the pulmonary arteries and veins. The last study demonstrated bilateral lower lobe scattered subsegmental arterial emboli which may or may not still be present. A 2 vessel aortic arch again noted with normal variant brachiobicarotid trunk. Mediastinum/Nodes: No enlarged mediastinal or axillary lymph nodes. Solitary prominent right mid hilar lymph node again measures 1.4 cm short axis on 3:91. Thyroid  gland, trachea, and esophagus demonstrate no significant findings. In addition, left to right mediastinal shift, largely due to left lower lobe volume loss seems unchanged. Lungs/Pleura: There is chronic left lower lobe basal bronchiectasis and scattered mucous plugging, with chronic posterior basal atelectasis and volume loss. There is additional chronic band atelectasis in the dorsal lingula.  There are additional linear scar-like opacities in the bases, both apices. The  lungs are mildly emphysematous with centrilobular paraseptal changes the upper lobes. No new or acute lung infiltrate is seen. There is a stable 5 mm middle lobe nodule on 4:102. Lungs are otherwise clear. There is no pleural effusion. No pneumothorax. Upper Abdomen: Renal and hepatic cysts are again noted, status post cholecystectomy and IVC filter in place. Abdominal aortic atherosclerosis. No acute upper abdominal findings. Musculoskeletal: Mild thoracic dextroscoliosis with osteopenia and degenerative change of the spine. Lumbar fusion hardware and laminectomy beginning at L3 incompletely imaged. No acute or significant osseous findings.  Chest wall mass. IMPRESSION: 1. No acute chest CT findings. 2. Chronic left lower lobe basal bronchiectasis and scattered mucous plugging, with chronic posterior basal atelectasis and volume loss. 3. Chronic band atelectasis in the dorsal lingula. 4. Emphysema. 5. Aortic and coronary artery atherosclerosis. 6. Stable 5 mm middle lobe nodule. 7. Stable prominent right hilar lymph node. 8. Osteopenia and degenerative change. 9. IVC filter in Situ. Aortic Atherosclerosis (ICD10-I70.0) and Emphysema (ICD10-J43.9). Electronically Signed   By: Denman Fischer M.D.   On: 03/02/2024 07:27   DG Chest Port 1 View Result Date: 03/01/2024 CLINICAL DATA:  Shortness of breath and cough EXAM: PORTABLE CHEST 1 VIEW COMPARISON:  02/21/2024, 06/26/2023, 07/08/2021 FINDINGS: Mild cardiomegaly. Right lung is clear. Small left effusion or pleural thickening. Streaky atelectasis or infiltrate left base without change. Stable cardiomediastinal silhouette IMPRESSION: Small left effusion or pleural thickening with streaky atelectasis or infiltrate at the left base. Electronically Signed   By: Esmeralda Hedge M.D.   On: 03/01/2024 23:51   VAS US  LOWER EXTREMITY VENOUS (DVT) Result Date: 02/22/2024  Lower Venous DVT Study  Patient Name:  Jerry [name].  Date of Exam:   02/22/2024 Medical Rec #: [id number]          Accession #:    [id number] Date of Birth: 1941-03-21           Patient Gender: M Patient Age:   21 years Exam Location[facility]  [facility] Procedure:      VAS US  LOWER EXTREMITY VENOUS (DVT) Referring Phys: Jetty Mort --------------------------------------------------------------------------------  Indications: History left popliteal acute DVT on 02/17/24 - Follow up study.  Risk Factors: DVT 02/17/24. Performing Technologist: Franky Ivanoff Sturdivant-Jones RDMS, RVT  Examination Guidelines: A complete evaluation includes B-mode imaging, spectral Doppler, color Doppler, and power Doppler as needed of all accessible portions of each vessel. Bilateral testing is considered an integral part of a complete examination. Limited examinations for reoccurring indications may be performed as noted. The reflux portion of the exam is performed with the patient in reverse Trendelenburg.  +---------+---------------+---------+-----------+----------+--------------+ LEFT     CompressibilityPhasicitySpontaneityPropertiesThrombus Aging +---------+---------------+---------+-----------+----------+--------------+ CFV      Full           Yes      Yes                                 +---------+---------------+---------+-----------+----------+--------------+ SFJ      Full                                                        +---------+---------------+---------+-----------+----------+--------------+ FV Prox  Full                                                        +---------+---------------+---------+-----------+----------+--------------+  FV Mid   Full                                                        +---------+---------------+---------+-----------+----------+--------------+ FV DistalFull                                                         +---------+---------------+---------+-----------+----------+--------------+ PFV      Full                                                        +---------+---------------+---------+-----------+----------+--------------+ POP      Partial        Yes      Yes                  Acute          +---------+---------------+---------+-----------+----------+--------------+ PTV      Full                                                        +---------+---------------+---------+-----------+----------+--------------+ PERO     Full                                                        +---------+---------------+---------+-----------+----------+--------------+     Summary: LEFT: - Findings consistent with acute deep vein thrombosis involving the left popliteal vein.  - Findings appear essentially unchanged compared to previous examination.  *See table(s) above for measurements and observations. Electronically signed by Irvin Mantel on 02/22/2024 at 9:00:00 PM.    Final    ECHOCARDIOGRAM COMPLETE Result Date: 02/22/2024    ECHOCARDIOGRAM LIMITED REPORT   Patient Name:   Jerry [name]. Date of Exam: 02/22/2024 Medical Rec #:  [id number]         Height:       71.0 in Accession #:    [id number]        Weight:       169.1 lb Date of Birth:  1941-03-09          BSA:          1.963 m Patient Age:    82 years          BP:           102/54 mmHg Patient Gender: M                 HR:           97 bpm. Exam Location:  Inpatient Procedure: 2D Echo, Cardiac Doppler and Color Doppler (Both Spectral and Color            Flow Doppler were utilized during procedure).  Indications:    Pulmonary Embolus I26.09  History:        Patient has prior history of Echocardiogram examinations, most                 recent 01/07/2024.  Sonographer:    Hersey Lorenzo RDCS Referring Phys: [id number] SUBRINA SUNDIL IMPRESSIONS  1. Left ventricular ejection fraction, by estimation, is 70 to 75%. The left ventricle has hyperdynamic  function. The left ventricle has no regional wall motion abnormalities. There is mild concentric left ventricular hypertrophy. There is the interventricular septum is flattened in systole, consistent with right ventricular pressure overload.  2. Right ventricular systolic function is low normal. The right ventricular size is normal.  3. Right atrial size was mildly dilated.  4. The mitral valve is normal in structure. No evidence of mitral valve regurgitation. No evidence of mitral stenosis.  5. The aortic valve is tricuspid. There is moderate calcification of the aortic valve. Aortic valve regurgitation is not visualized. Aortic valve sclerosis/calcification is present, without any evidence of aortic stenosis.  6. The inferior vena cava is normal in size with greater than 50% respiratory variability, suggesting right atrial pressure of 3 mmHg. Conclusion(s)/Recommendation(s): Full study unable to be loaded currently. FINDINGS  Left Ventricle: Left ventricular ejection fraction, by estimation, is 70 to 75%. The left ventricle has hyperdynamic function. The left ventricle has no regional wall motion abnormalities. The left ventricular internal cavity size was normal in size. There is mild concentric left ventricular hypertrophy. The interventricular septum is flattened in systole, consistent with right ventricular pressure overload. Right Ventricle: The right ventricular size is normal. No increase in right ventricular wall thickness. Right ventricular systolic function is low normal. Left Atrium: Left atrial size was normal in size. Right Atrium: Right atrial size was mildly dilated. Pericardium: Trivial pericardial effusion is present. The pericardial effusion is posterior to the left ventricle. Mitral Valve: The mitral valve is normal in structure. No evidence of mitral valve stenosis. Tricuspid Valve: The tricuspid valve is normal in structure. Tricuspid valve regurgitation is trivial. No evidence of tricuspid  stenosis. Aortic Valve: The aortic valve is tricuspid. There is moderate calcification of the aortic valve. Aortic valve regurgitation is not visualized. Aortic valve sclerosis/calcification is present, without any evidence of aortic stenosis. Pulmonic Valve: The pulmonic valve was normal in structure. Pulmonic valve regurgitation is trivial. No evidence of pulmonic stenosis. Aorta: The aortic root is normal in size and structure. Venous: The inferior vena cava is normal in size with greater than 50% respiratory variability, suggesting right atrial pressure of 3 mmHg. IAS/Shunts: No atrial level shunt detected by color flow Doppler. LEFT VENTRICLE PLAX 2D LVIDd:         4.70 cm   Diastology LVIDs:         2.40 cm   LV e' lateral:   6.85 cm/s LV PW:         0.90 cm   LV E/e' lateral: 12.0 LV IVS:        0.80 cm LVOT diam:     2.00 cm LV SV:         74 LV SV Index:   37 LVOT Area:     3.14 cm  RIGHT VENTRICLE RV S prime:     15.20 cm/s TAPSE (M-mode): 2.1 cm LEFT ATRIUM             Index        RIGHT ATRIUM  Index LA diam:        2.60 cm 1.32 cm/m   RA Area:     23.30 cm LA Vol (A2C):   49.2 ml 25.06 ml/m  RA Volume:   66.60 ml  33.92 ml/m LA Vol (A4C):   60.6 ml 30.86 ml/m LA Biplane Vol: 56.1 ml 28.57 ml/m  AORTIC VALVE LVOT Vmax:   120.00 cm/s LVOT Vmean:  84.400 cm/s LVOT VTI:    0.234 m  AORTA Ao Root diam: 3.00 cm Ao Asc diam:  3.00 cm MITRAL VALVE MV Area (PHT): 5.50 cm     SHUNTS MV Decel Time: 138 msec     Systemic VTI:  0.23 m MV E velocity: 82.30 cm/s   Systemic Diam: 2.00 cm MV A velocity: 111.00 cm/s MV E/A ratio:  0.74 Arta Lark Electronically signed by Arta Lark Signature Date/Time: 02/22/2024/10:48:35 AM    Final    HYBRID OR IMAGING ([facility] ONLY) Result Date: 02/22/2024 There is no interpretation for this exam.  This order is for images obtained during a surgical procedure.  Please See "Surgeries" Tab for more information regarding the procedure.   DG Chest Port 1  View Result Date: 02/21/2024 CLINICAL DATA:  GI bleed EXAM: PORTABLE CHEST 1 VIEW COMPARISON:  02/15/2024 FINDINGS: Probable small left effusion with mild atelectasis or infiltrate persisting at the left base. Stable cardiomediastinal silhouette. No pneumothorax IMPRESSION: Probable small left effusion with mild atelectasis or infiltrate at the left base. Electronically Signed   By: Esmeralda Hedge M.D.   On: 02/21/2024 20:33   VAS US  LOWER EXTREMITY VENOUS (DVT) Result Date: 02/17/2024  Lower Venous DVT Study Patient Name:  Jerry [name].  Date of Exam:   02/17/2024 Medical Rec #: [id number]          Accession #:    [id number] Date of Birth: Oct 20, 1941           Patient Gender: M Patient Age:   81 years Exam Location:  [facility] Procedure:      VAS US  LOWER EXTREMITY VENOUS (DVT) Referring Phys: Maylene Spear --------------------------------------------------------------------------------  Indications: SOB, and pulmonary embolism.  Comparison Study: Previous exam on 01/27/2024 ([facility]LEV) was negative for DVT Performing Technologist: Arlyce Berger RVT, RDMS  Examination Guidelines: A complete evaluation includes B-mode imaging, spectral Doppler, color Doppler, and power Doppler as needed of all accessible portions of each vessel. Bilateral testing is considered an integral part of a complete examination. Limited examinations for reoccurring indications may be performed as noted. The reflux portion of the exam is performed with the patient in reverse Trendelenburg.  +---------+---------------+---------+-----------+----------+--------------+ RIGHT    CompressibilityPhasicitySpontaneityPropertiesThrombus Aging +---------+---------------+---------+-----------+----------+--------------+ CFV      Full           Yes      Yes                                 +---------+---------------+---------+-----------+----------+--------------+ SFJ      Full                                                         +---------+---------------+---------+-----------+----------+--------------+ FV Prox  Full           Yes      Yes                                 +---------+---------------+---------+-----------+----------+--------------+  FV Mid   Full           Yes      Yes                                 +---------+---------------+---------+-----------+----------+--------------+ FV DistalFull           Yes      Yes                                 +---------+---------------+---------+-----------+----------+--------------+ PFV      Full                                                        +---------+---------------+---------+-----------+----------+--------------+ POP      Full           Yes      Yes                                 +---------+---------------+---------+-----------+----------+--------------+ PTV      Full                                                        +---------+---------------+---------+-----------+----------+--------------+ PERO     Full                                                        +---------+---------------+---------+-----------+----------+--------------+   +---------+---------------+---------+-----------+----------+--------------+ LEFT     CompressibilityPhasicitySpontaneityPropertiesThrombus Aging +---------+---------------+---------+-----------+----------+--------------+ CFV      Full           Yes      Yes                                 +---------+---------------+---------+-----------+----------+--------------+ SFJ      Full                                                        +---------+---------------+---------+-----------+----------+--------------+ FV Prox  Full           Yes      Yes                                 +---------+---------------+---------+-----------+----------+--------------+ FV Mid   Full           Yes      Yes                                  +---------+---------------+---------+-----------+----------+--------------+ FV DistalFull  Yes      Yes                                 +---------+---------------+---------+-----------+----------+--------------+ PFV      Full                                                        +---------+---------------+---------+-----------+----------+--------------+ POP      Partial        Yes      Yes                  Acute          +---------+---------------+---------+-----------+----------+--------------+ PTV      Full                                                        +---------+---------------+---------+-----------+----------+--------------+ PERO     Full                                                        +---------+---------------+---------+-----------+----------+--------------+     Summary: BILATERAL: -No evidence of popliteal cyst, bilaterally. RIGHT: - There is no evidence of deep vein thrombosis in the lower extremity.  LEFT: - Findings consistent with acute deep vein thrombosis involving the left popliteal vein.   *See table(s) above for measurements and observations. Electronically signed by Genny Kid MD on 02/17/2024 at 1:28:34 PM.    Final    CT Angio Chest PE W/Cm &/Or Wo Cm Result Date: 02/15/2024 CLINICAL DATA:  Shortness of breath EXAM: CT ANGIOGRAPHY CHEST WITH CONTRAST TECHNIQUE: Multidetector CT imaging of the chest was performed using the standard protocol during bolus administration of intravenous contrast. Multiplanar CT image reconstructions and MIPs were obtained to evaluate the vascular anatomy. RADIATION DOSE REDUCTION: This exam was performed according to the departmental dose-optimization program which includes automated exposure control, adjustment of the mA and/or kV according to patient size and/or use of iterative reconstruction technique. CONTRAST:  60mL OMNIPAQUE  IOHEXOL  350 MG/ML SOLN COMPARISON:  X-ray earlier 02/15/2024. CT without  contrast 01/27/2024 FINDINGS: Cardiovascular: There is enlarged heart. Coronary calcifications are seen. Small pericardial effusion. Thoracic aorta is normal course and caliber with partially calcified atherosclerotic plaque. There is a bovine type aortic arch, normal variant. Filling defects seen along branches of the pulmonary artery in the right lower lobe, segmental. Some areas also suggested in the left lower lobe consistent with pulmonary emboli. Mild overall clot burden. No reflux of contrast into the intrahepatic IVC. Mediastinum/Nodes: Preserved thyroid  gland. The thoracic esophagus is normal course and caliber. No specific abnormal lymph node enlargement seen in the axillary region. Some small less than 1 cm size mediastinal nodes are seen, not pathologic by size criteria. Slightly larger right hilar node identified measuring 18 by 13 mm on series 7, image 202. of note there is also shift of the mediastinum from right to left with left lung volume loss. Lungs/Pleura:  Centrilobular emphysematous changes identified. Once again there is also lower lobe areas of bronchiectasis with mucous plugging. Parenchymal opacities identified as well along both lower lobes with small amount of along the dependent lingula. These areas are similar to previous. Opacity in the more mid upper left upper lobe have shown improvement. Opacities in the right upper lobe are also overall less in amount but there are some new areas more posteriorly in the right upper lobe on series 8, image 69. 5 mm nodule seen along the course of the minor fissure, stable. Diffuse breathing motion. No pneumothorax. Upper Abdomen: Complex upper pole left-sided renal cyst identified as on prior. Please correlate prior abdomen pelvis CT. Preserved adrenal glands. Prior cholecystectomy Musculoskeletal: Curvature and degenerative changes seen along the spine. Review of the MIP images confirms the above findings. Critical Value/emergent results were  called by telephone at the time of interpretation on 02/15/2024 at 10:55 am to provider Dr. Val Garin, who verbally acknowledged these results. IMPRESSION: Bilateral lower lobe segmental pulmonary emboli.  Mild clot burden. Slightly improving opacities in both upper lobes with a subtle new area of mild ground-glass opacity right upper lobe. Areas of opacity in both both lower lobes are similar to the prior CT scan. Volume loss and shift of the mediastinum from right to left. Complex left-sided renal cystic foci. Please correlate with prior abdomen pelvis CT Aortic Atherosclerosis (ICD10-I70.0) and Emphysema (ICD10-J43.9). Electronically Signed   By: Adrianna Horde M.D.   On: 02/15/2024 10:56   DG Chest 2 View Result Date: 02/15/2024 CLINICAL DATA:  Shortness of breath. EXAM: CHEST - 2 VIEW COMPARISON:  02/03/2024 FINDINGS: Subtle retrocardiac opacity is similar to prior compatible with atelectasis or infiltrate. Tiny left pleural effusion. No dense consolidation in the right lung. No pneumothorax. Cardiopericardial silhouette is at upper limits of normal for size. No acute bony abnormality. Degenerative changes are noted in the right shoulder. IMPRESSION: 1. No substantial change in exam. 2. Subtle retrocardiac opacity compatible with atelectasis or infiltrate. 3. Tiny left pleural effusion. Electronically Signed   By: Donnal Fusi M.D.   On: 02/15/2024 05:17     TODAY-DAY OF DISCHARGE:  Subjective:   Jerry [name] today has no headache,no chest abdominal pain,no new weakness tingling or numbness, feels much better wants to go home today.   Objective:   Blood pressure 109/64, pulse 74, temperature 97.9 F (36.6 C), temperature source Oral, resp. rate 17, height 5\' 11"  (1.803 m), weight 79.5 kg, SpO2 99%.  Intake/Output Summary (Last 24 hours) at 03/08/2024 0920 Last data filed at 03/08/2024 0330 Gross per 24 hour  Intake 480 ml  Output 2625 ml  Net -2145 ml   Filed Weights   03/05/24 0500 03/06/24  0425 03/08/24 0500  Weight: 81.9 kg 82.3 kg 79.5 kg    Exam: Awake Alert, Oriented *3, No new F.N deficits, Normal affect Hoffman.AT,PERRAL Supple Neck,No JVD, No cervical lymphadenopathy appriciated.  Symmetrical Chest wall movement, Good air movement bilaterally, CTAB RRR,No Gallops,Rubs or new Murmurs, No Parasternal Heave +ve B.Sounds, Abd Soft, Non tender, No organomegaly appriciated, No rebound -guarding or rigidity. No Cyanosis, Clubbing or edema, No new Rash or bruise   PERTINENT RADIOLOGIC STUDIES: No results found.   PERTINENT LAB RESULTS: CBC: Recent Labs    03/07/24 0453 03/08/24 0402  WBC 8.2 8.7  HGB 8.0* 8.4*  HCT 27.1* 28.3*  PLT 264 278   CMET CMP     Component Value Date/Time   NA 139 03/08/2024 0402   NA  142 12/27/2023 1106   K 4.8 03/08/2024 0402   CL 101 03/08/2024 0402   CO2 29 03/08/2024 0402   GLUCOSE 102 (H) 03/08/2024 0402   BUN 40 (H) 03/08/2024 0402   BUN 31 (H) 12/27/2023 1106   CREATININE 2.16 (H) 03/08/2024 0402   CALCIUM  9.1 03/08/2024 0402   PROT 5.3 (L) 03/03/2024 0444   PROT 6.6 12/27/2023 1106   ALBUMIN 2.6 (L) 03/03/2024 0444   ALBUMIN 3.9 12/27/2023 1106   AST 17 03/03/2024 0444   ALT 15 03/03/2024 0444   ALKPHOS 55 03/03/2024 0444   BILITOT 0.4 03/03/2024 0444   BILITOT 0.4 12/27/2023 1106   EGFR 34 (L) 12/27/2023 1106   GFRNONAA 30 (L) 03/08/2024 0402    GFR Estimated Creatinine Clearance: 29.1 mL/min (A) (by C-G formula based on SCr of 2.16 mg/dL (H)). No results for input(s): "LIPASE", "AMYLASE" in the last 72 hours. No results for input(s): "CKTOTAL", "CKMB", "CKMBINDEX", "TROPONINI" in the last 72 hours. Invalid input(s): "POCBNP" No results for input(s): "DDIMER" in the last 72 hours. No results for input(s): "HGBA1C" in the last 72 hours. No results for input(s): "CHOL", "HDL", "LDLCALC", "TRIG", "CHOLHDL", "LDLDIRECT" in the last 72 hours. No results for input(s): "TSH", "T4TOTAL", "T3FREE", "THYROIDAB" in the  last 72 hours.  Invalid input(s): "FREET3" No results for input(s): "VITAMINB12", "FOLATE", "FERRITIN", "TIBC", "IRON ", "RETICCTPCT" in the last 72 hours. Coags: No results for input(s): "INR" in the last 72 hours.  Invalid input(s): "PT" Microbiology: Recent Results (from the past 240 hours)  Resp panel by RT-PCR (RSV, Flu A&B, Covid) Anterior Nasal Swab     Status: None   Collection Time: 03/01/24 10:50 PM   Specimen: Anterior Nasal Swab  Result Value Ref Range Status   SARS Coronavirus 2 by RT PCR NEGATIVE NEGATIVE Final   Influenza A by PCR NEGATIVE NEGATIVE Final   Influenza B by PCR NEGATIVE NEGATIVE Final    Comment: (NOTE) The Xpert Xpress SARS-CoV-2/FLU/RSV plus assay is intended as an aid in the diagnosis of influenza from Nasopharyngeal swab specimens and should not be used as a sole basis for treatment. Nasal washings and aspirates are unacceptable for Xpert Xpress SARS-CoV-2/FLU/RSV testing.  Fact Sheet for Patients: BloggerCourse.com  Fact Sheet for Healthcare Providers: SeriousBroker.it  This test is not yet approved or cleared by the [place]  FDA and has been authorized for detection and/or diagnosis of SARS-CoV-2 by FDA under an Emergency Use Authorization (EUA). This EUA will remain in effect (meaning this test can be used) for the duration of the COVID-19 declaration under Section 564(b)(1) of the Act, 21 U.S.C. section 360bbb-3(b)(1), unless the authorization is terminated or revoked.     Resp Syncytial Virus by PCR NEGATIVE NEGATIVE Final    Comment: (NOTE) Fact Sheet for Patients: BloggerCourse.com  Fact Sheet for Healthcare Providers: SeriousBroker.it  This test is not yet approved or cleared by the [place]  FDA and has been authorized for detection and/or diagnosis of SARS-CoV-2 by FDA under an Emergency Use Authorization (EUA). This EUA  will remain in effect (meaning this test can be used) for the duration of the COVID-19 declaration under Section 564(b)(1) of the Act, 21 U.S.C. section 360bbb-3(b)(1), unless the authorization is terminated or revoked.  Performed at [facility], [address] [place], [place] [address]     FURTHER DISCHARGE INSTRUCTIONS:  Get Medicines reviewed and adjusted: Please take all your medications with you for your next visit with your Primary MD  Laboratory/radiological data:  Please request your Primary MD to go over all hospital tests and procedure/radiological results at the follow up, please ask your Primary MD to get all Hospital records sent to his/her office.  In some cases, they will be blood work, cultures and biopsy results pending at the time of your discharge. Please request that your primary care M.D. goes through all the records of your hospital data and follows up on these results.  Also Note the following: If you experience worsening of your admission symptoms, develop shortness of breath, life threatening emergency, suicidal or homicidal thoughts you must seek medical attention immediately by calling 911 or calling your MD immediately  if symptoms less severe.  You must read complete instructions/literature along with all the possible adverse reactions/side effects for all the Medicines you take and that have been prescribed to you. Take any new Medicines after you have completely understood and accpet all the possible adverse reactions/side effects.   Do not drive when taking Pain medications or sleeping medications (Benzodaizepines)  Do not take more than prescribed Pain, Sleep and Anxiety Medications. It is not advisable to combine anxiety,sleep and pain medications without talking with your primary care practitioner  Special Instructions: If you have smoked or chewed Tobacco  in the last 2 yrs please stop smoking, stop any regular Alcohol  and or any Recreational  drug use.  Wear Seat belts while driving.  Please note: You were cared for by a hospitalist during your hospital stay. Once you are discharged, your primary care physician will handle any further medical issues. Please note that NO REFILLS for any discharge medications will be authorized once you are discharged, as it is imperative that you return to your primary care physician (or establish a relationship with a primary care physician if you do not have one) for your post hospital discharge needs so that they can reassess your need for medications and monitor your lab values.  Total Time spent coordinating discharge including counseling, education and face to face time equals greater than 30 minutes.  Signed: Kimberly Penna 03/08/2024 9:20 AM

## 2024-03-08 NOTE — Progress Notes (Signed)
 Mobility Specialist Progress Note:   03/08/24 1000  Oxygen Therapy  O2 Device Nasal Cannula  O2 Flow Rate (L/min) 3 L/min  Mobility  Activity Ambulated with assistance in hallway  Level of Assistance Contact guard assist, steadying assist  Assistive Device Front wheel walker  Distance Ambulated (ft) 100 ft  Activity Response Tolerated well  Mobility Referral Yes  Mobility visit 1 Mobility  Mobility Specialist Start Time (ACUTE ONLY) 0844  Mobility Specialist Stop Time (ACUTE ONLY) 0904  Mobility Specialist Time Calculation (min) (ACUTE ONLY) 20 min    Pre Mobility: 89 HR,  96% SpO2 During Mobility: 101 HR,  91% SpO2 Post Mobility:  90 HR,  94% SpO2  Received pt in bed having no complaints and agreeable to mobility. Took x2 standing breaks to adjust pants. Pt was asymptomatic throughout ambulation and returned to room w/o fault. Left in chair w/ call bell in reach and all needs met. Chair alarm on.   [facility]'Vante Nolon Baxter Mobility Specialist Please contact via Special educational needs teacher or Rehab office at [phone]

## 2024-03-08 NOTE — Plan of Care (Signed)
   Problem: Coping: Goal: Ability to adjust to condition or change in health will improve Outcome: Progressing   Problem: Fluid Volume: Goal: Ability to maintain a balanced intake and output will improve Outcome: Progressing

## 2024-04-04 ENCOUNTER — Telehealth: Payer: Self-pay | Admitting: Cardiology

## 2024-04-04 NOTE — Telephone Encounter (Signed)
 Hb 7,5 now, was 8.4 on 03/08/2024. Continue to look for any signs of black stool or frank bleeding or syncope or any signs of hemodynamic compromise. In absence of that, continue Eliquis  given recent PE. Continue follow up with PCP and GI, will need repeat Hb check next week. Would defer to this to PCP.  Thanks MJP

## 2024-04-04 NOTE — Telephone Encounter (Signed)
 Pt's wife contacted and advised. Pt's wife agrees with plan of care.

## 2024-04-04 NOTE — Telephone Encounter (Signed)
 Reviewed discharge instructions from hospital on 03/08/24. It states as follows:     Pt's wife reports that pt went to PCP last week and HGB was 7.5. She reports that HGB was 10 leaving the hospital. Pt declines visible blood loss. Reports being asymptomatic, other than shortness of breath but pt has COPD. Pt is currently taking Eliquis  5 mg BID. Wife admits that pt was asymptomatic with previous hospital admission as well.

## 2024-04-04 NOTE — Telephone Encounter (Signed)
 Pt c/o medication issue:  1. Name of Medication:   apixaban  (ELIQUIS ) 5 MG TABS tablet   2. How are you currently taking this medication (dosage and times per day)?   As prescribed  3. Are you having a reaction (difficulty breathing--STAT)?   No  4. What is your medication issue?   Wife Adriana Hopping) stated patient was prescribed this medication while in hospital.  Wife wants a call back to discuss if patient needs to continue on this medication.

## 2024-04-14 ENCOUNTER — Inpatient Hospital Stay

## 2024-04-14 ENCOUNTER — Encounter: Payer: Self-pay | Admitting: Hematology and Oncology

## 2024-04-14 ENCOUNTER — Telehealth: Payer: Self-pay

## 2024-04-14 ENCOUNTER — Inpatient Hospital Stay: Attending: Hematology and Oncology | Admitting: Hematology and Oncology

## 2024-04-14 VITALS — BP 116/72 | HR 85 | Temp 97.6°F | Resp 17 | Ht 71.0 in | Wt 176.2 lb

## 2024-04-14 DIAGNOSIS — I825Y9 Chronic embolism and thrombosis of unspecified deep veins of unspecified proximal lower extremity: Secondary | ICD-10-CM

## 2024-04-14 DIAGNOSIS — I2699 Other pulmonary embolism without acute cor pulmonale: Secondary | ICD-10-CM

## 2024-04-14 DIAGNOSIS — Z86718 Personal history of other venous thrombosis and embolism: Secondary | ICD-10-CM | POA: Insufficient documentation

## 2024-04-14 DIAGNOSIS — N189 Chronic kidney disease, unspecified: Secondary | ICD-10-CM | POA: Diagnosis not present

## 2024-04-14 DIAGNOSIS — N289 Disorder of kidney and ureter, unspecified: Secondary | ICD-10-CM

## 2024-04-14 DIAGNOSIS — Z86711 Personal history of pulmonary embolism: Secondary | ICD-10-CM | POA: Insufficient documentation

## 2024-04-14 DIAGNOSIS — D539 Nutritional anemia, unspecified: Secondary | ICD-10-CM

## 2024-04-14 DIAGNOSIS — I129 Hypertensive chronic kidney disease with stage 1 through stage 4 chronic kidney disease, or unspecified chronic kidney disease: Secondary | ICD-10-CM | POA: Diagnosis not present

## 2024-04-14 DIAGNOSIS — D509 Iron deficiency anemia, unspecified: Secondary | ICD-10-CM | POA: Insufficient documentation

## 2024-04-14 DIAGNOSIS — D631 Anemia in chronic kidney disease: Secondary | ICD-10-CM | POA: Insufficient documentation

## 2024-04-14 DIAGNOSIS — Z9981 Dependence on supplemental oxygen: Secondary | ICD-10-CM | POA: Diagnosis not present

## 2024-04-14 DIAGNOSIS — I2694 Multiple subsegmental pulmonary emboli without acute cor pulmonale: Secondary | ICD-10-CM

## 2024-04-14 DIAGNOSIS — K922 Gastrointestinal hemorrhage, unspecified: Secondary | ICD-10-CM | POA: Insufficient documentation

## 2024-04-14 DIAGNOSIS — R0602 Shortness of breath: Secondary | ICD-10-CM | POA: Diagnosis not present

## 2024-04-14 DIAGNOSIS — Z8551 Personal history of malignant neoplasm of bladder: Secondary | ICD-10-CM | POA: Insufficient documentation

## 2024-04-14 DIAGNOSIS — D500 Iron deficiency anemia secondary to blood loss (chronic): Secondary | ICD-10-CM | POA: Diagnosis not present

## 2024-04-14 LAB — CBC WITH DIFFERENTIAL (CANCER CENTER ONLY)
Abs Immature Granulocytes: 0.03 10*3/uL (ref 0.00–0.07)
Basophils Absolute: 0 10*3/uL (ref 0.0–0.1)
Basophils Relative: 1 %
Eosinophils Absolute: 0.1 10*3/uL (ref 0.0–0.5)
Eosinophils Relative: 2 %
HCT: 28.4 % — ABNORMAL LOW (ref 39.0–52.0)
Hemoglobin: 8.3 g/dL — ABNORMAL LOW (ref 13.0–17.0)
Immature Granulocytes: 0 %
Lymphocytes Relative: 23 %
Lymphs Abs: 1.9 10*3/uL (ref 0.7–4.0)
MCH: 23.4 pg — ABNORMAL LOW (ref 26.0–34.0)
MCHC: 29.2 g/dL — ABNORMAL LOW (ref 30.0–36.0)
MCV: 80.2 fL (ref 80.0–100.0)
Monocytes Absolute: 0.8 10*3/uL (ref 0.1–1.0)
Monocytes Relative: 10 %
Neutro Abs: 5.5 10*3/uL (ref 1.7–7.7)
Neutrophils Relative %: 64 %
Platelet Count: 375 10*3/uL (ref 150–400)
RBC: 3.54 MIL/uL — ABNORMAL LOW (ref 4.22–5.81)
RDW: 16.8 % — ABNORMAL HIGH (ref 11.5–15.5)
WBC Count: 8.5 10*3/uL (ref 4.0–10.5)
nRBC: 0 % (ref 0.0–0.2)

## 2024-04-14 LAB — SAMPLE TO BLOOD BANK

## 2024-04-14 LAB — CMP (CANCER CENTER ONLY)
ALT: 19 U/L (ref 0–44)
AST: 24 U/L (ref 15–41)
Albumin: 4.2 g/dL (ref 3.5–5.0)
Alkaline Phosphatase: 76 U/L (ref 38–126)
Anion gap: 13 (ref 5–15)
BUN: 42 mg/dL — ABNORMAL HIGH (ref 8–23)
CO2: 26 mmol/L (ref 22–32)
Calcium: 9.8 mg/dL (ref 8.9–10.3)
Chloride: 98 mmol/L (ref 98–111)
Creatinine: 2.44 mg/dL — ABNORMAL HIGH (ref 0.61–1.24)
GFR, Estimated: 26 mL/min — ABNORMAL LOW (ref >60)
Glucose, Bld: 80 mg/dL (ref 70–99)
Potassium: 4.1 mmol/L (ref 3.5–5.1)
Sodium: 137 mmol/L (ref 135–145)
Total Bilirubin: 0.5 mg/dL (ref 0.0–1.2)
Total Protein: 7.4 g/dL (ref 6.5–8.1)

## 2024-04-14 LAB — VITAMIN B12: Vitamin B-12: 1023 pg/mL — ABNORMAL HIGH (ref 180–914)

## 2024-04-14 LAB — RETICULOCYTES
Immature Retic Fract: 36.4 % — ABNORMAL HIGH (ref 2.3–15.9)
RBC.: 3.53 MIL/uL — ABNORMAL LOW (ref 4.22–5.81)
Retic Count, Absolute: 93.2 10*3/uL (ref 19.0–186.0)
Retic Ct Pct: 2.6 % (ref 0.4–3.1)

## 2024-04-14 LAB — ABO/RH: ABO/RH(D): O POS

## 2024-04-14 LAB — SEDIMENTATION RATE: Sed Rate: 21 mm/h — ABNORMAL HIGH (ref 0–16)

## 2024-04-14 LAB — LACTATE DEHYDROGENASE: LDH: 119 U/L (ref 98–192)

## 2024-04-14 NOTE — Assessment & Plan Note (Signed)
 Recent CT imaging study was reviewed which showed no evidence of recurrent disease

## 2024-04-14 NOTE — Assessment & Plan Note (Addendum)
 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 We discussed some of the risks, benefits, and alternatives of intravenous iron  infusions. The patient is symptomatic from anemia and the iron  level is critically low. He tolerated oral iron  supplement poorly and desires to achieved higher levels of iron  faster for adequate hematopoesis. Some of the side-effects to be expected including risks of infusion reactions, phlebitis, headaches, nausea and fatigue.  The patient is willing to proceed. Patient education material was dispensed.  Goal is to keep ferritin level greater than 50 and resolution of anemia Once his iron  storage is replenished, I can consider starting him on erythropoietin stimulating agent

## 2024-04-14 NOTE — Assessment & Plan Note (Signed)
 The patient have history of DVT and PE His anticoagulation therapy is discontinued prematurely due to chronic bleeding Continue supportive care for now He has IVC filter in situ

## 2024-04-14 NOTE — Assessment & Plan Note (Signed)
 Multifactorial I reviewed his last CT imaging of the chest There are signs of emphysema His anemia probably contributed to He will continue supportive care

## 2024-04-14 NOTE — Telephone Encounter (Signed)
 Dr. Marton Sleeper, patient will be scheduled as soon as possible.  Auth Submission: NO AUTH NEEDED Site of care: Site of care: [facility] Payer: Medicare A/B with AARP supplement Medication & CPT/J Code(s) submitted: Feraheme (ferumoxytol) [id number] Route of submission (phone, fax, portal):  Phone # Fax # Auth type: Buy/Bill PB Units/visits requested: 510mg  x 2 doses Reference number:  Approval from: 04/14/24 to 08/14/24

## 2024-04-14 NOTE — Progress Notes (Signed)
 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 We discussed some of the risks, benefits, and alternatives of intravenous iron  infusions. The patient is symptomatic from anemia and the iron  level is critically low. He tolerated oral iron  supplement poorly and desires to achieved higher levels of iron  faster for adequate hematopoesis. Some of the side-effects to be expected including risks of infusion reactions, phlebitis, headaches, nausea and fatigue.  The patient is willing to proceed. Patient education material was dispensed.  Goal is to keep ferritin level greater than 50 and resolution of anemia Once his iron  storage is replenished, I can consider starting him on erythropoietin stimulating agent   SOB (shortness of breath) Multifactorial I reviewed his last CT imaging of the chest There are signs of emphysema His anemia probably contributed to He will continue supportive care  History of malignant neoplasm of bladder Recent CT imaging study was reviewed which showed no evidence of recurrent disease  Acute pulmonary embolism (HCC) The patient have history  of DVT and PE His anticoagulation therapy is discontinued prematurely due to chronic bleeding Continue supportive care for now He has IVC filter in situ Orders Placed This Encounter  Procedures   CBC with Differential ([name] Only)    Standing Status:   Future    Number of Occurrences:   1    Expiration Date:   04/14/2025   CMP ([name] only)    Standing Status:   Future    Number of Occurrences:   1    Expiration Date:   04/14/2025   Erythropoietin    Standing Status:   Future    Number of Occurrences:   1    Expiration Date:   04/14/2025   Ferritin    Standing Status:   Future    Number of Occurrences:   1    Expiration Date:   04/14/2025   Iron  and Iron  Binding Capacity ([facility] only)    Standing Status:   Future    Number of Occurrences:   1    Expiration Date:   04/14/2025   Vitamin B12    Standing Status:   Future    Number of Occurrences:   1    Expiration Date:   04/14/2025   Sedimentation rate    Standing Status:   Future    Number of Occurrences:   1    Expiration Date:   04/14/2025   Reticulocytes    Standing Status:   Future    Number of Occurrences:   1    Expiration Date:   04/14/2025   Lactate dehydrogenase    Standing Status:   Future    Number of Occurrences:   1  Expiration Date:   04/14/2025   Kappa/lambda light chains    Standing Status:   Future    Number of Occurrences:   1    Expiration Date:   04/14/2025   Multiple Myeloma Panel (SPEP&IFE w/QIG)    Standing Status:   Future    Number of Occurrences:   1    Expiration Date:   04/14/2025   ABO/Rh    Standing Status:   Future    Number of Occurrences:   1    Expiration Date:   04/14/2025   Sample to Blood Bank    Standing Status:   Future    Number of Occurrences:   1    Expiration Date:   04/14/2025    All questions were answered. The patient knows to call the clinic with any problems, questions or concerns.  The total time spent in the appointment was 60 minutes encounter with patients  including review of chart and various tests results, discussions about plan of care and coordination of care plan  Almeda Jacobs, MD 6/2/20252:42 PM   CHIEF COMPLAINTS/PURPOSE OF CONSULTATION:  Anemia  HISTORY OF PRESENTING ILLNESS:  Jerry [name]. 83 y.o. male is here because of anemia  He was found to have abnormal CBC from recent blood work The patient is known to have dementia.  His wife provided most of the history and I reviewed his records extensively He has significant major comorbidities including [name] history of bladder cancer status post surgery and chemotherapy in [place] He moved to [place] 5 years ago He had recurrent hospitalizations over the past 6 months, related to GI bleed, status post fall, community-acquired pneumonia, pulmonary embolism status post IVC filter placement and recent respiratory failure Since recent discharge from the hospital, he has been oxygen dependent He requires assistance in most activities of daily living He had noted history of melena intermittently in the past few weeks but no hematochezia He denies nosebleeds or blood in his urine He has chronic shortness of breath but denies recent chest pain or dizziness Patient was on Eliquis  since April but that was discontinued recently due to recurrent bleeding.  His IVC filter is still in situ And going through his diet history, he eats a variety of diet and denies pica He received blood transfusion recently in April as well as 30 years ago when he underwent back surgery.  He is currently taking iron  supplement daily in the morning 1 hour before breakfast On October 09, 2023, colonoscopy revealed large internal hemorrhoids and angioectasias treated On February 22, 2024, EGD revealed scattered inflammation/gastritis  MEDICAL HISTORY:  Past Medical History:  Diagnosis Date   Allergic rhinitis 07/21/2021   Anemia 04/17/2015   Arthritis    Asthma    Asthma, mild intermittent    Benign  essential hypertension 07/21/2021   Bladder cancer (HCC) 2019   CAP (community acquired pneumonia) 06/26/2023   Degenerative cervical spinal stenosis 07/21/2021   Faintness    History of malignant neoplasm of bladder 07/21/2021   Hyperglycemia 04/17/2015   Hypertension    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   Kidney stone    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   Malnutrition of moderate degree 06/28/2023   Mixed hyperlipidemia 07/21/2021   OSA (obstructive sleep apnea) 07/12/2021   NPSG- 03/16/21- AHI 10.3/ hr, desaturation to 87%, body weight 190 lbs     Renal insufficiency 04/17/2015   Syncope 04/17/2015   Tremor 07/21/2021   Vascular dementia (HCC)  Vitamin D  deficiency 07/21/2021    SURGICAL HISTORY: Past Surgical History:  Procedure Laterality Date   BACK SURGERY     BIOPSY  10/09/2023   Procedure: BIOPSY;  Surgeon: Felecia Hopper, MD;  Location: [facility];  Service: Gastroenterology;;   CHOLECYSTECTOMY     COLONOSCOPY WITH PROPOFOL  Bilateral 10/09/2023   Procedure: COLONOSCOPY WITH PROPOFOL ;  Surgeon: Felecia Hopper, MD;  Location: [facility];  Service: Gastroenterology;  Laterality: Bilateral;   ESOPHAGOGASTRODUODENOSCOPY N/A 02/22/2024   Procedure: EGD (ESOPHAGOGASTRODUODENOSCOPY);  Surgeon: Felecia Hopper, MD;  Location: [facility];  Service: Gastroenterology;  Laterality: N/A;   ESOPHAGOGASTRODUODENOSCOPY (EGD) WITH PROPOFOL  Bilateral 10/09/2023   Procedure: ESOPHAGOGASTRODUODENOSCOPY (EGD) WITH PROPOFOL ;  Surgeon: Felecia Hopper, MD;  Location: [facility];  Service: Gastroenterology;  Laterality: Bilateral;   HOT HEMOSTASIS N/A 10/09/2023   Procedure: HOT HEMOSTASIS (ARGON PLASMA COAGULATION/BICAP);  Surgeon: Felecia Hopper, MD;  Location: [facility];  Service: Gastroenterology;  Laterality: N/A;   KNEE SURGERY Left    LOWER EXTREMITY VENOGRAPHY  01/29/2024   Procedure: LOWER EXTREMITY VENOGRAPHY;  Surgeon: Swazil[place], Peter M, MD;  Location: [facility]  INVASIVE CV LAB;  Service: Cardiovascular;;   RIGHT/LEFT HEART CATH AND CORONARY ANGIOGRAPHY N/A 01/29/2024   Procedure: RIGHT/LEFT HEART CATH AND CORONARY ANGIOGRAPHY;  Surgeon: Swaziland, Peter M, MD;  Location: [name] INVASIVE CV LAB;  Service: Cardiovascular;  Laterality: N/A;   SHOULDER SURGERY     for rotator cuff tear   VENA CAVA UMBRELLA NECK APPROACH Right 02/22/2024   Procedure: INSERTION, UMBRELLA FILTER, INFERIOR VENA CAVA;  Surgeon: Adine Hoof, MD;  Location: [facility] OR;  Service: Vascular;  Laterality: Right;    SOCIAL HISTORY: Social History   Socioeconomic History   Marital status: Married    Spouse name: Not on file   Number of children: Not on file   Years of education: Not on file   Highest education level: Not on file  Occupational History   Not on file  Tobacco Use   Smoking status: Never   Smokeless tobacco: Never  Vaping Use   Vaping status: Never Used  Substance and Sexual Activity   Alcohol use: Yes    Alcohol/week: 3.0 standard drinks of alcohol    Types: 3 Standard drinks or equivalent per week   Drug use: Yes    Types: Marijuana   Sexual activity: Not on file  Other Topics Concern   Not on file  Social History Narrative   Not on file   Social Drivers of Health   Financial Resource Strain: Not on file  Food Insecurity: No Food Insecurity (04/14/2024)   Hunger Vital Sign    Worried About Running Out of Food in the Last Year: Never true    Ran Out of Food in the Last Year: Never true  Transportation Needs: No Transportation Needs (04/14/2024)   PRAPARE - Administrator, Civil Service (Medical): No    Lack of Transportation (Non-Medical): No  Physical Activity: Not on file  Stress: Not on file  Social Connections: Socially Integrated (03/04/2024)   Social Connection and Isolation Panel [NHANES]    Frequency of Communication with Friends and Family: Three times a week    Frequency of Social Gatherings with Friends and Family: Twice a  week    Attends Religious Services: 1 to 4 times per year    Active Member of [employer] or Organizations: Yes    Attends Banker Meetings: 1 to 4 times per year    Marital Status: Married  Intimate Partner Violence: Not At Risk (04/14/2024)   Humiliation, Afraid, Rape, and Kick questionnaire    Fear of Current or Ex-Partner: No    Emotionally Abused: No    Physically Abused: No    Sexually Abused: No    FAMILY HISTORY: Family History  Problem Relation Age of Onset   Diabetes Mother    Diabetes Father     ALLERGIES:  has no known allergies.  MEDICATIONS:  Current Outpatient Medications  Medication Sig Dispense Refill   furosemide  (LASIX ) 40 MG tablet Take 40 mg by mouth daily.     albuterol  (VENTOLIN  HFA) 108 (90 Base) MCG/ACT inhaler Inhale 2 puffs into the lungs every 6 (six) hours as needed for wheezing or shortness of breath. 8 g 6   ALPRAZolam  (XANAX ) 0.5 MG tablet Take 0.5 mg by mouth 2 (two) times daily as needed for anxiety.     amLODipine  (NORVASC ) 10 MG tablet Take 5 mg by mouth daily.     atorvastatin  (LIPITOR) 20 MG tablet Take 1 tablet (20 mg total) by mouth daily. 30 tablet 0   Chlorphen-PE-Acetaminophen  4-10-325 MG TABS Take 1 tablet by mouth daily as needed (For sinus).     Cholecalciferol  1000 UNITS tablet Take 1,000 Units by mouth daily.     Cyanocobalamin  (VITAMIN B12 PO) Take 1 tablet by mouth daily.     DULERA  200-5 MCG/ACT AERO Inhale 2 puffs into the lungs 2 (two) times daily.     escitalopram  (LEXAPRO ) 10 MG tablet Take 10 mg by mouth daily.     FeFum-FePoly-FA-B Cmp-C-Biot (FOLIVANE-PLUS) CAPS Take 1 capsule by mouth daily.     ipratropium-albuterol  (DUONEB) 0.5-2.5 (3) MG/3ML SOLN Inhale 3 mLs into the lungs every 2 (two) hours as needed (SOB/Wheezing).     memantine  (NAMENDA ) 5 MG tablet Take 10 mg by mouth 2 (two) times daily.     metoprolol  succinate (TOPROL -XL) 25 MG 24 hr tablet Take 0.5 tablets (12.5 mg total) by mouth at bedtime. 30 tablet  0   OLANZapine  (ZYPREXA ) 2.5 MG tablet Take 1 tablet (2.5 mg total) by mouth 2 (two) times daily as needed (agitation). 60 tablet 0   pantoprazole  (PROTONIX ) 40 MG tablet Take 1 tablet (40 mg total) by mouth 2 (two) times daily. 60 tablet 2   No current facility-administered medications for this visit.    REVIEW OF SYSTEMS:  All other systems were reviewed with the patient and are negative.  PHYSICAL EXAMINATION: ECOG PERFORMANCE STATUS: 3 - Symptomatic, >50% confined to bed  Vitals:   04/14/24 1309  BP: 116/72  Pulse: 85  Resp: 17  Temp: 97.6 F (36.4 C)  SpO2: 100%   Filed Weights   04/14/24 1309  Weight: 176 lb 3.2 oz (79.9 kg)    GENERAL:alert, no distress and comfortable SKIN: skin color, texture, turgor are normal, no rashes or significant lesions EYES: normal, conjunctiva are pink and non-injected, sclera clear OROPHARYNX:no exudate, no erythema and lips, buccal mucosa, and tongue normal  NECK: supple, thyroid  normal size, non-tender, without nodularity LYMPH:  no palpable lymphadenopathy in the cervical, axillary or inguinal LUNGS: clear to auscultation and percussion with normal breathing effort HEART: regular rate & rhythm and no murmurs and no lower extremity edema ABDOMEN:abdomen soft, urostomy in situ Musculoskeletal:no cyanosis of digits and no clubbing  PSYCH: alert & oriented x 3 with fluent speech NEURO: no focal motor/sensory deficits  RADIOGRAPHIC STUDIES: I have personally reviewed CT imaging from April 2025 as well as March 2025

## 2024-04-15 ENCOUNTER — Encounter: Payer: Self-pay | Admitting: Hematology and Oncology

## 2024-04-15 LAB — FERRITIN: Ferritin: 51 ng/mL (ref 24–336)

## 2024-04-15 LAB — KAPPA/LAMBDA LIGHT CHAINS
Kappa free light chain: 53.3 mg/L — ABNORMAL HIGH (ref 3.3–19.4)
Kappa, lambda light chain ratio: 1.59 (ref 0.26–1.65)
Lambda free light chains: 33.5 mg/L — ABNORMAL HIGH (ref 5.7–26.3)

## 2024-04-15 LAB — ERYTHROPOIETIN: Erythropoietin: 30.1 m[IU]/mL — ABNORMAL HIGH (ref 2.6–18.5)

## 2024-04-15 LAB — IRON AND IRON BINDING CAPACITY (CC-WL,HP ONLY)
Iron: 51 ug/dL (ref 45–182)
Saturation Ratios: 12 % — ABNORMAL LOW (ref 17.9–39.5)
TIBC: 440 ug/dL (ref 250–450)
UIBC: 389 ug/dL — ABNORMAL HIGH (ref 117–376)

## 2024-04-16 LAB — MULTIPLE MYELOMA PANEL, SERUM
Albumin SerPl Elph-Mcnc: 4 g/dL (ref 2.9–4.4)
Albumin/Glob SerPl: 1.4 (ref 0.7–1.7)
Alpha 1: 0.2 g/dL (ref 0.0–0.4)
Alpha2 Glob SerPl Elph-Mcnc: 0.7 g/dL (ref 0.4–1.0)
B-Globulin SerPl Elph-Mcnc: 1 g/dL (ref 0.7–1.3)
Gamma Glob SerPl Elph-Mcnc: 0.9 g/dL (ref 0.4–1.8)
Globulin, Total: 2.9 g/dL (ref 2.2–3.9)
IgA: 249 mg/dL (ref 61–437)
IgG (Immunoglobin G), Serum: 1039 mg/dL (ref 603–1613)
IgM (Immunoglobulin M), Srm: 25 mg/dL (ref 15–143)
Total Protein ELP: 6.9 g/dL (ref 6.0–8.5)

## 2024-04-18 ENCOUNTER — Telehealth: Payer: Self-pay

## 2024-04-18 NOTE — Telephone Encounter (Signed)
 Changed 6/12 to phone call per wife's request.

## 2024-04-18 NOTE — Telephone Encounter (Signed)
 Returned call to wife and left a message asking her to call the office back.

## 2024-04-24 ENCOUNTER — Inpatient Hospital Stay: Admitting: Hematology and Oncology

## 2024-04-24 ENCOUNTER — Ambulatory Visit

## 2024-04-24 ENCOUNTER — Encounter: Payer: Self-pay | Admitting: Hematology and Oncology

## 2024-04-24 ENCOUNTER — Ambulatory Visit: Admitting: Hematology and Oncology

## 2024-04-24 ENCOUNTER — Telehealth: Payer: Self-pay | Admitting: Cardiology

## 2024-04-24 VITALS — BP 141/70 | HR 53 | Temp 98.4°F | Resp 18 | Ht 71.5 in | Wt 181.2 lb

## 2024-04-24 DIAGNOSIS — D509 Iron deficiency anemia, unspecified: Secondary | ICD-10-CM | POA: Diagnosis not present

## 2024-04-24 DIAGNOSIS — D539 Nutritional anemia, unspecified: Secondary | ICD-10-CM

## 2024-04-24 DIAGNOSIS — N179 Acute kidney failure, unspecified: Secondary | ICD-10-CM

## 2024-04-24 DIAGNOSIS — N1832 Chronic kidney disease, stage 3b: Secondary | ICD-10-CM | POA: Diagnosis not present

## 2024-04-24 DIAGNOSIS — D500 Iron deficiency anemia secondary to blood loss (chronic): Secondary | ICD-10-CM | POA: Diagnosis not present

## 2024-04-24 MED ORDER — SODIUM CHLORIDE 0.9 % IV SOLN
510.0000 mg | Freq: Once | INTRAVENOUS | Status: AC
  Administered 2024-04-24: 510 mg via INTRAVENOUS
  Filled 2024-04-24: qty 17

## 2024-04-24 NOTE — Telephone Encounter (Signed)
 Pt c/o medication issue:  1. Name of Medication: metoprolol  succinate (TOPROL -XL) 25 MG 24 hr tablet   amLODipine  (NORVASC ) 10 MG tablet        2. How are you currently taking this medication (dosage and times per day)? Not taking  3. Are you having a reaction (difficulty breathing--STAT)? No  4. What is your medication issue? RN Beth from [facility] is calling to inform us  that the PCP changed the patient's medications due to the patient's BP running low. Beth gave a call back number of [phone]. Please advise.

## 2024-04-24 NOTE — Progress Notes (Signed)
 Diagnosis: Acute Anemia  Provider:  Phyllis Breeze MD  Procedure: IV Infusion  IV Type: Peripheral, IV Location: L Forearm  Feraheme (Ferumoxytol), Dose: 510 mg  Infusion Start Time: 1428  Infusion Stop Time: 1444  Post Infusion IV Care: Observation period completed and Peripheral IV Discontinued  Discharge: Condition: Good, Destination: Home . AVS Provided  Performed by:  Shirly Dow, RN

## 2024-04-24 NOTE — Progress Notes (Signed)
 HEMATOLOGY-ONCOLOGY ELECTRONIC VISIT PROGRESS NOTE  Patient Care Team: Jerry Hence, MD as PCP - General (Internal Medicine) Jerry Das, MD as PCP - Cardiology (Cardiology)  I connected with the patient via telephone conference and verified that I am speaking with the correct person using two identifiers. The patient's location is at home and I am providing care from the [facility] I discussed the limitations, risks, security and privacy concerns of performing an evaluation and management service by e-visits and the availability of in person appointments.  I also discussed with the patient that there may be a patient responsible charge related to this service. The patient expressed understanding and agreed to proceed.   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 He was on anticoagulation therapy up until recently and that was discontinued by his primary care doctor EGD was done recently which revealed no source of bleeding but colonoscopy from last year show signs of internal hemorrhoids and telangiectasia  He is scheduled to start IV iron  today and next week Plan to bring him back first week of August to repeat labs Once we replenish his iron  supply, we can start him on erythropoietin  stimulating agent  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) There is a component of anemia chronic kidney disease I ordered myeloma panel that came back unremarkable He will continue risk factor modification for chronic kidney failure  Orders Placed This Encounter  Procedures   Erythropoietin     Standing Status:   Future    Expiration Date:   04/24/2025   Ferritin    Standing Status:   Future    Expiration Date:   04/24/2025   Iron  and Iron  Binding Capacity ([facility] only)    Standing Status:   Future    Expiration Date:   04/24/2025   CBC  with Differential ([facility] Only)    Standing Status:   Future    Expiration Date:   04/24/2025   CMP ([facility] only)    Standing Status:   Future    Expiration Date:   04/24/2025   Sedimentation rate    Standing Status:   Future    Expiration Date:   04/24/2025    INTERVAL HISTORY: Please see below for problem oriented charting. The purpose of today's discussion is review test results I discussed all test results with the patient and his wife and we discussed future follow-up   Jerry [name]. 83 y.o. male is here because of anemia Summary of of hematological history He was found to have abnormal CBC from recent blood work The patient is known to have dementia.  His wife provided most of the history and I reviewed his records extensively He has significant major comorbidities including [name] history of bladder cancer status post surgery and chemotherapy in [place] He moved to [place] 5 years ago He had recurrent hospitalizations over the past 6 months, related to GI bleed, status post fall, community-acquired pneumonia, pulmonary embolism status post IVC filter placement and recent respiratory failure Since recent discharge from the hospital, he has been oxygen dependent He requires assistance in most activities of daily living He had noted history of melena intermittently in the past few weeks but no hematochezia He denies nosebleeds or blood in his urine He has chronic shortness of breath but denies recent chest pain or dizziness Patient was on  Eliquis  since April but that was discontinued recently due to recurrent bleeding.  His IVC filter is still in situ And going through his diet history, he eats a variety of diet and denies pica He received blood transfusion recently in April as well as 30 years ago when he underwent back surgery.  He is currently taking iron  supplement daily in the morning 1 hour before breakfast On October 09, 2023, colonoscopy revealed  large internal hemorrhoids and angioectasias treated On February 22, 2024, EGD revealed scattered inflammation/gastritis April 24, 2024, he started to receive intravenous iron  Feraheme  I discussed the assessment and treatment plan with the patient. The patient was provided an opportunity to ask questions and all were answered. The patient agreed with the plan and demonstrated an understanding of the instructions. The patient was advised to call back or seek an in-person evaluation if the symptoms worsen or if the condition fails to improve as anticipated.    I spent 20 minutes for the appointment reviewing test results, discuss management and coordination of care.  Jerry Jacobs, MD 04/24/2024 11:34 AM

## 2024-04-24 NOTE — Telephone Encounter (Signed)
 Spoke to [employer] RN with [facility] she was calling to report patient's PCP stopped several medications 2 weeks ago due to low B/P.He stopped taking Eliquis ,Metoprolol ,Amlodipine .He only takes Furosemide  40 mg daily as needed.B/P this morning 134/78 pulse 76.Yesterday B/P 120/70.Stated patient feels ok.He has mild edema in both ankles.Advised I will send message to Dr.Patwardhan.

## 2024-04-24 NOTE — Assessment & Plan Note (Signed)
 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

## 2024-04-24 NOTE — Assessment & Plan Note (Signed)
 There is a component of anemia chronic kidney disease I ordered myeloma panel that came back unremarkable He will continue risk factor modification for chronic kidney failure

## 2024-04-25 NOTE — Telephone Encounter (Signed)
 Called Beth RN with [facility] Dr.Patwardhan's advice left on her personal voice mail.

## 2024-04-25 NOTE — Telephone Encounter (Signed)
 I have not seen him since April.  He has had multiple medical issues including PE and GI bleeding since then.  It is hard to make determination about Eliquis  without further discussion with the patient.  Blood pressure looks good.  He has an appointment with me on 05/15/2024, where I can discuss this further.   Thanks MJP

## 2024-05-01 ENCOUNTER — Ambulatory Visit

## 2024-05-01 VITALS — BP 156/81 | HR 71 | Temp 97.6°F | Resp 20 | Ht 71.0 in | Wt 181.2 lb

## 2024-05-01 DIAGNOSIS — D509 Iron deficiency anemia, unspecified: Secondary | ICD-10-CM

## 2024-05-01 MED ORDER — SODIUM CHLORIDE 0.9 % IV SOLN
510.0000 mg | Freq: Once | INTRAVENOUS | Status: AC
  Administered 2024-05-01: 510 mg via INTRAVENOUS
  Filled 2024-05-01: qty 17

## 2024-05-01 NOTE — Progress Notes (Signed)
 Diagnosis: Acute Anemia  Provider:  Phyllis Breeze MD  Procedure: IV Infusion  IV Type: Peripheral, IV Location: L Hand  Feraheme (Ferumoxytol ), Dose: 510 mg  Infusion Start Time: 1503  Infusion Stop Time: 1520  Post Infusion IV Care: Observation period completed  Discharge: Condition: Good, Destination: Home . AVS Provided  Performed by:  Rachelle Bue, RN

## 2024-05-15 ENCOUNTER — Ambulatory Visit: Attending: Cardiology | Admitting: Cardiology

## 2024-05-15 ENCOUNTER — Telehealth: Payer: Self-pay

## 2024-05-15 ENCOUNTER — Encounter: Payer: Self-pay | Admitting: Cardiology

## 2024-05-15 VITALS — BP 108/74 | HR 82 | Resp 16 | Ht 71.0 in | Wt 179.0 lb

## 2024-05-15 DIAGNOSIS — I272 Pulmonary hypertension, unspecified: Secondary | ICD-10-CM | POA: Diagnosis not present

## 2024-05-15 DIAGNOSIS — I21A1 Myocardial infarction type 2: Secondary | ICD-10-CM | POA: Diagnosis present

## 2024-05-15 DIAGNOSIS — I8290 Acute embolism and thrombosis of unspecified vein: Secondary | ICD-10-CM | POA: Diagnosis not present

## 2024-05-15 NOTE — Telephone Encounter (Signed)
 Pt's wife, Ronal called this afternoon to discuss plan moving forward for IVC. Pt saw Dr Elmira who recommends f/u with Dr Lonn per OV note regarding IVC. Dr Lonn note from 6/12 indicates she would like pt to f/u first week of August. This has not been scheduled, and pt's wife is requesting appt sooner to discuss plan. Advised Mrs Scharnhorst I would make MD aware. She verbalized thanks.

## 2024-05-15 NOTE — Progress Notes (Signed)
 Cardiology Office Note:  .   Date:  05/15/2024  ID:  Jerry JONETTA Secundino Mickey., DOB Oct 23, 1941, MRN [id number] PCP: Theo Iha, MD   [facility] Prov[facility] Cardiologist:  Newman Lawrence, MD PCP: Theo Iha, MD  Chief Complaint  Patient presents with   Moderate pulmonary hypertension   Follow-up      History of Present Illness: .    Jerry Terhune. is a 83 y.o. 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  His breathing improved, and leg edema is completely resolved.  He is using supplemental oxygen, well-tolerated.  He remains off anticoagulation medications given his recurrent history of GI bleed.  He gets regular IV iron  transfusions as per Dr. Buzz recommendations.    Vitals:   05/15/24 1013  BP: 108/74  Pulse: 82  Resp: 16  SpO2: 99%       ROS:  Review of Systems  Constitutional: Positive for malaise/fatigue.  Cardiovascular:  Negative for chest pain, dyspnea on exertion, leg swelling, palpitations and syncope.     Studies Reviewed: SABRA        EKG 02/12/2024: Normal sinus rhythm Rightward axis Anterior infarct (cited on or before 31-Jan-2024) When compared with ECG of 31-Jan-2024 08:00, PR interval has decreased QRS axis Shifted right Nonspecific T wave abnormality no longer evident in Inferior leads   Labs 04/2024: Hb 8.3 Cr 2.44  01/2024: Chol 119, TG 94, HDL 61, LDL 39 HbA1C 6.2% Hb 8.3 Cr 2.14, eGFR 31 Trop HS peak 6425 BNP 159  Right and left heart catheterization 01/29/2024:   Prox RCA lesion is 25% stenosed.   LV end diastolic pressure is mildly elevated.   Hemodynamic findings consistent with moderate pulmonary hypertension.   Minimal  nonobstructive CAD Mildly elevated LV filling pressures. LVEDP 19 mm Hg. PCWP 24/27 with mean 19 mm Hg Moderate pulmonary HTN. PAP 65/30 with mean 40 mm Hg Cardiac output 5.49 L/min, index 2.83   Plan: medical management. Type 2 NSTEMI   Echocardiogram 12/2023:  1. Right ventricular systolic function is mildly reduced. The right  ventricular size is normal. There is severely elevated pulmonary artery  systolic pressure. The estimated right ventricular systolic pressure is  63.2 mmHg.   2. Left ventricular ejection fraction, by estimation, is 55 to 60%. Left  ventricular ejection fraction by 3D volume is 57 %. The left ventricle has  normal function. The left ventricle has no regional wall motion  abnormalities. There is moderate left  ventricular hypertrophy. Left ventricular diastolic parameters are  indeterminate. There is the interventricular septum is flattened in  systole and diastole, consistent with right ventricular pressure and  volume overload.   3. The mitral valve is normal in structure. Trivial mitral valve  regurgitation.   4. The aortic valve is tricuspid. Aortic valve regurgitation is not  visualized. No aortic stenosis is present.   5. The inferior vena cava is normal in size with greater than 50%  respiratory variability, suggesting right atrial pressure of 3 mmHg.       Physical Exam:   Physical Exam Vitals and nursing note reviewed.  Constitutional:      General: He is not in acute distress. Neck:     Vascular: No JVD.  Cardiovascular:     Rate  and Rhythm: Normal rate and regular rhythm.     Heart sounds: Normal heart sounds. No murmur heard. Pulmonary:     Effort: Pulmonary effort is normal.     Breath sounds: Normal breath sounds. No wheezing or rales.  Musculoskeletal:     Right lower leg: No edema.     Left lower leg: No edema.      VISIT DIAGNOSES:   ICD-10-CM   1. Moderate pulmonary hypertension (HCC)  I27.20     2. Type 2 MI  (myocardial infarction) (HCC)  I21.A1     3. Venous thromboembolism  I82.90          ASSESSMENT AND PLAN: .    Jerry Fredin. is a 83 y.o. 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 Mean PA pressure of 40 mmHg, wedge of 19, PVR 3.4 (Cath 01/2024) He has multiple comorbidities that could be contributing to his pulmonary hypertension, especially anemia and history of DVT/PE. He is fairly euvolemic on low-dose Lasix , with improvement in symptoms on supplemental oxygen.  I am not so sure he is going to tolerate PAH specific therapy, even if he were to have it, given his age and comorbidities. Continue current management at this time.   I will see him back in 3 months to reassess his pulmonary hypertension symptoms.    H/o type II MI: Continue statin. Not on aspirin  due to history of GI bleed.  DVT/PE, recurrent GI bleed: Tough situation.  Currently, he has IVC filter in place.  I would defer to Dr. Lonn regarding the duration of IVC filter.  In my mind, since there is no acute reversible cause found, he will remain at risk of recurrent DVT PE without IVC filter.  F/u in 3 months  Signed, Newman JINNY Lawrence, MD

## 2024-05-15 NOTE — Patient Instructions (Signed)
 Follow-Up: At [facility], you and your health needs are our priority.  As part of our continuing mission to provide you with exceptional heart care, our providers are all part of one team.  This team includes your primary Cardiologist (physician) and Advanced Practice Providers or APPs (Physician Assistants and Nurse Practitioners) who all work together to provide you with the care you need, when you need it.  Your next appointment:   3 Months  Provider:   Newman JINNY Lawrence, MD    We recommend signing up for the patient portal called MyChart.  Sign up information is provided on this After Visit Summary.  MyChart is used to connect with patients for Virtual Visits (Telemedicine).  Patients are able to view lab/test results, encounter notes, upcoming appointments, etc.  Non-urgent messages can be sent to your provider as well.   To learn more about what you can do with MyChart, go to [email or web address].

## 2024-05-19 NOTE — Telephone Encounter (Signed)
 Hi Val,  I sent scheduling msg to see him back next month since June, that has not been done yet The IVC was placed by vascular team; I am not sure what Leita means by moving forward for IVC

## 2024-05-20 NOTE — Telephone Encounter (Signed)
 His wife's question, previously was how long he would need the IVC filter in place, and they were advised by Dr Elmira that you would make the decision about that. Scheduling has not contacted Mrs Sproles about scheduling Jerry [name], so I s/w her today and scheduled him for 06/12/24 at 1200 labs with 1240 MD f/u. She was agreeable to this date & time.

## 2024-06-12 ENCOUNTER — Encounter: Payer: Self-pay | Admitting: Hematology and Oncology

## 2024-06-12 ENCOUNTER — Telehealth: Payer: Self-pay

## 2024-06-12 ENCOUNTER — Inpatient Hospital Stay: Attending: Hematology and Oncology

## 2024-06-12 ENCOUNTER — Inpatient Hospital Stay (HOSPITAL_BASED_OUTPATIENT_CLINIC_OR_DEPARTMENT_OTHER): Admitting: Hematology and Oncology

## 2024-06-12 VITALS — BP 157/89 | HR 78 | Temp 97.7°F | Resp 18 | Ht 71.0 in | Wt 183.4 lb

## 2024-06-12 DIAGNOSIS — N1832 Chronic kidney disease, stage 3b: Secondary | ICD-10-CM | POA: Insufficient documentation

## 2024-06-12 DIAGNOSIS — N179 Acute kidney failure, unspecified: Secondary | ICD-10-CM

## 2024-06-12 DIAGNOSIS — D539 Nutritional anemia, unspecified: Secondary | ICD-10-CM | POA: Insufficient documentation

## 2024-06-12 DIAGNOSIS — D631 Anemia in chronic kidney disease: Secondary | ICD-10-CM | POA: Diagnosis present

## 2024-06-12 LAB — IRON AND IRON BINDING CAPACITY (CC-WL,HP ONLY)
Iron: 77 ug/dL (ref 45–182)
Saturation Ratios: 28 % (ref 17.9–39.5)
TIBC: 277 ug/dL (ref 250–450)
UIBC: 200 ug/dL (ref 117–376)

## 2024-06-12 LAB — CBC WITH DIFFERENTIAL (CANCER CENTER ONLY)
Abs Immature Granulocytes: 0.02 K/uL (ref 0.00–0.07)
Basophils Absolute: 0.1 K/uL (ref 0.0–0.1)
Basophils Relative: 1 %
Eosinophils Absolute: 0.3 K/uL (ref 0.0–0.5)
Eosinophils Relative: 5 %
HCT: 34.7 % — ABNORMAL LOW (ref 39.0–52.0)
Hemoglobin: 10 g/dL — ABNORMAL LOW (ref 13.0–17.0)
Immature Granulocytes: 0 %
Lymphocytes Relative: 28 %
Lymphs Abs: 1.6 K/uL (ref 0.7–4.0)
MCH: 23.4 pg — ABNORMAL LOW (ref 26.0–34.0)
MCHC: 28.8 g/dL — ABNORMAL LOW (ref 30.0–36.0)
MCV: 81.1 fL (ref 80.0–100.0)
Monocytes Absolute: 0.7 K/uL (ref 0.1–1.0)
Monocytes Relative: 12 %
Neutro Abs: 3 K/uL (ref 1.7–7.7)
Neutrophils Relative %: 54 %
Platelet Count: 202 K/uL (ref 150–400)
RBC: 4.28 MIL/uL (ref 4.22–5.81)
RDW: 15.8 % — ABNORMAL HIGH (ref 11.5–15.5)
WBC Count: 5.7 K/uL (ref 4.0–10.5)
nRBC: 0 % (ref 0.0–0.2)

## 2024-06-12 LAB — CMP (CANCER CENTER ONLY)
ALT: 11 U/L (ref 0–44)
AST: 11 U/L — ABNORMAL LOW (ref 15–41)
Albumin: 4 g/dL (ref 3.5–5.0)
Alkaline Phosphatase: 90 U/L (ref 38–126)
Anion gap: 5 (ref 5–15)
BUN: 40 mg/dL — ABNORMAL HIGH (ref 8–23)
CO2: 31 mmol/L (ref 22–32)
Calcium: 9.7 mg/dL (ref 8.9–10.3)
Chloride: 107 mmol/L (ref 98–111)
Creatinine: 1.88 mg/dL — ABNORMAL HIGH (ref 0.61–1.24)
GFR, Estimated: 35 mL/min — ABNORMAL LOW (ref >60)
Glucose, Bld: 95 mg/dL (ref 70–99)
Potassium: 4.7 mmol/L (ref 3.5–5.1)
Sodium: 143 mmol/L (ref 135–145)
Total Bilirubin: 0.3 mg/dL (ref 0.0–1.2)
Total Protein: 6.8 g/dL (ref 6.5–8.1)

## 2024-06-12 LAB — SEDIMENTATION RATE: Sed Rate: 7 mm/h (ref 0–16)

## 2024-06-12 LAB — FERRITIN: Ferritin: 70 ng/mL (ref 24–336)

## 2024-06-12 NOTE — Progress Notes (Signed)
   [facility] OFFICE PROGRESS NOTE  Jerry Iha, MD  ASSESSMENT & PLAN:  Assessment & Plan Deficiency anemia The cause of his anemia is multifactorial, likely due to chronic GI bleed and anemia due to chronic renal failure He was on anticoagulation therapy up until recently and that was discontinued by his primary care doctor EGD was done recently which revealed no source of bleeding but colonoscopy from last year show signs of internal hemorrhoids and telangiectasia  After multiple doses of intravenous iron  infusions, his anemia has improved. Iron  studies are still pending, I will call his wife with test result Once we replenish his iron  supply, we can start him on erythropoietin  stimulating agent 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) The patient has chronic renal failure He can benefit from erythropoietin  stimulating agent once we confirmed adequate iron  replacement    Orders Placed This Encounter  Procedures   CBC with Differential (Cancer Center Only)    Standing Status:   Future    Expiration Date:   06/12/2025   CMP (Cancer Center only)    Standing Status:   Future    Expiration Date:   06/12/2025   Ferritin    Standing Status:   Future    Expiration Date:   06/12/2025   Iron  and Iron  Binding Capacity ([facility] only)    Standing Status:   Future    Expiration Date:   06/12/2025    INTERVAL HISTORY: Patient returns for recurrent anemia Symptoms of anemia includes dyspnea We reviewed CBC results today  SUMMARY OF HEMATOLOGIC HISTORY: Jerry [name]. is here because of anemia Summary of of hematological history He was found to have abnormal CBC from recent blood work The patient is known to have dementia.  His wife provided most of the history and I reviewed his records extensively He has significant major comorbidities including prior history of bladder cancer status post surgery and [place] in [place] He moved to [place] 5 years ago He had recurrent hospitalizations over the past 6 months, related to GI bleed, status post fall, community-acquired pneumonia, pulmonary embolism status post IVC filter placement and recent respiratory failure Since recent discharge from the hospital, he has been oxygen dependent He requires assistance in most activities of daily living He had noted history of melena intermittently in the past few weeks but no hematochezia He denies nosebleeds or blood in his urine He has chronic shortness of breath but denies recent chest pain or dizziness Patient was on Eliquis  since April but that was discontinued recently due to recurrent bleeding.  His IVC filter is still in situ And going through his diet history, he eats a variety of diet and denies pica He received blood transfusion recently in April as well as 30 years ago when he underwent back surgery.  He is currently taking iron  supplement daily in the morning 1 hour before breakfast On October 09, 2023, colonoscopy revealed large internal hemorrhoids and angioectasias treated On February 22, 2024, EGD revealed scattered inflammation/gastritis April 24, 2024, he started to receive intravenous iron  Feraheme   Lab Results  Component Value Date   VITAMINB12 1,023 (H) 04/14/2024   FERRITIN 51 04/14/2024   Vitals:   06/12/24 1235 06/12/24 1240  BP: (!) 170/87 (!) 157/89  Pulse: 78   Resp: 18   Temp: 97.7 F (36.5 C)   SpO2: 98%

## 2024-06-12 NOTE — Assessment & Plan Note (Addendum)
 The patient has chronic renal failure He can benefit from erythropoietin  stimulating agent once we confirmed adequate iron  replacement

## 2024-06-12 NOTE — Telephone Encounter (Signed)
 Wife, Ronal, called to clarify if patient needed to be fasting for afternoon labs today. Wife is aware that patient does not need to be fasting for this afternoon's labs. Wife verbalized an understanding of the information and expressed appreciation for the call.

## 2024-06-12 NOTE — Assessment & Plan Note (Addendum)
 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 Iron  studies are still pending, I will call his wife with test result Once we replenish his iron  supply, we can start him on erythropoietin  stimulating agent

## 2024-06-13 LAB — ERYTHROPOIETIN: Erythropoietin: 17.6 m[IU]/mL (ref 2.6–18.5)

## 2024-06-13 NOTE — Procedures (Signed)
Mask fit

## 2024-06-16 ENCOUNTER — Ambulatory Visit: Payer: Self-pay | Admitting: Hematology and Oncology

## 2024-06-16 DIAGNOSIS — D539 Nutritional anemia, unspecified: Secondary | ICD-10-CM

## 2024-06-16 NOTE — Telephone Encounter (Signed)
 Spoke with patient's wife, Ronal, to review recent labs results and to schedule follow-up appointments for labs and provider visit. Mary informed that labs showed improvement and patient would not need injection at this time.  He is to come back for labs in 3 months time, followed by a provider visit the next week with Dr. Lonn.  Appointments scheduled and confirmed with wife while on the line.  No questions at this time.

## 2024-08-15 ENCOUNTER — Ambulatory Visit: Attending: Cardiology | Admitting: Cardiology

## 2024-08-15 ENCOUNTER — Encounter: Payer: Self-pay | Admitting: Cardiology

## 2024-08-15 VITALS — BP 124/75 | HR 101 | Ht 71.0 in | Wt 197.4 lb

## 2024-08-15 DIAGNOSIS — I272 Pulmonary hypertension, unspecified: Secondary | ICD-10-CM | POA: Insufficient documentation

## 2024-08-15 NOTE — Progress Notes (Signed)
 Cardiology Office Note:  .   Date:  08/15/2024  ID:  Jerry JONETTA Secundino Mickey., DOB January 12, 1941, MRN [id number] PCP: Theo Iha, MD  [facility] Providers Cardiologist:  Newman Lawrence, MD PCP: Theo Iha, MD  Chief Complaint  Patient presents with   Pulmonary hypertension      History of Present Illness: .    Jerry Wisler. is a 83 y.o. 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  He is symptomatically stable without worsening dyspnea on exertion.  Oxygen requirement has stayed stable.  Leg edema has essentially resolved.  He is taking Lasix  only as needed.    Recently, he was evaluated by neurology for his paranoia, parkinsonism, essential tremor.  Conservative management was recommended.    He has follow-up with hematologist Dr. Lonn in a few weeks.  He currently has IVC filter after DVT/PE and GI bleeding.  Is not on anticoagulation.    Vitals:   08/15/24 1053  BP: 124/75  Pulse: (!) 101        ROS:  Review of Systems  Constitutional: Positive for malaise/fatigue.  Cardiovascular:  Positive for dyspnea on exertion (Stable). Negative for chest pain, leg swelling, palpitations and syncope.     Studies Reviewed: SABRA        EKG 02/12/2024: Normal sinus rhythm Rightward axis Anterior infarct (cited on or before 31-Jan-2024) When compared with ECG of 31-Jan-2024 08:00, PR interval has decreased QRS axis Shifted right Nonspecific T wave abnormality no longer evident in Inferior leads  Labs 05/2024: Hb 10.0 Cr 1.88  Labs 04/2024: Hb 8.3 Cr 2.44  01/2024: Chol 119, TG 94, HDL 61, LDL 39 HbA1C 6.2% Hb 8.3 Cr 2.14, eGFR 31 Trop HS peak  6425 BNP 159  Right and left heart catheterization 01/29/2024:   Prox RCA lesion is 25% stenosed.   LV end diastolic pressure is mildly elevated.   Hemodynamic findings consistent with moderate pulmonary hypertension.   Minimal nonobstructive CAD Mildly elevated LV filling pressures. LVEDP 19 mm Hg. PCWP 24/27 with mean 19 mm Hg Moderate pulmonary HTN. PAP 65/30 with mean 40 mm Hg Cardiac output 5.49 L/min, index 2.83   Plan: medical management. Type 2 NSTEMI   Echocardiogram 12/2023:  1. Right ventricular systolic function is mildly reduced. The right  ventricular size is normal. There is severely elevated pulmonary artery  systolic pressure. The estimated right ventricular systolic pressure is  63.2 mmHg.   2. Left ventricular ejection fraction, by estimation, is 55 to 60%. Left  ventricular ejection fraction by 3D volume is 57 %. The left ventricle has  normal function. The left ventricle has no regional wall motion  abnormalities. There is moderate left  ventricular hypertrophy. Left ventricular diastolic parameters are  indeterminate. There is the interventricular septum is flattened in  systole and diastole, consistent with right ventricular pressure and  volume overload.   3. The mitral valve is normal in structure. Trivial mitral valve  regurgitation.   4. The aortic valve is tricuspid. Aortic valve regurgitation is not  visualized. No aortic stenosis is present.   5. The inferior vena cava is normal in size with greater than 50%  respiratory variability, suggesting right atrial pressure of 3 mmHg.  Physical Exam:   Physical Exam Vitals and nursing note reviewed.  Constitutional:      General: He is not in acute distress. Neck:     Vascular: No JVD.  Cardiovascular:     Rate and Rhythm: Normal rate and regular rhythm.     Heart sounds: Normal heart sounds. No murmur heard. Pulmonary:     Effort: Pulmonary effort is normal.     Breath sounds: Normal  breath sounds. No wheezing or rales.  Musculoskeletal:     Right lower leg: No edema.     Left lower leg: No edema.      VISIT DIAGNOSES:   ICD-10-CM   1. Moderate pulmonary hypertension (HCC)  I27.20           ASSESSMENT AND PLAN: .    Jerry Delduca. is a 83 y.o. 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 Mean PA pressure of 40 mmHg, wedge of 19, PVR 3.4 (Cath 01/2024) He has multiple comorbidities that could be contributing to his pulmonary hypertension, especially anemia and history of DVT/PE. Recommend conservative medical management. Currently, he is only on Lasix  as needed for leg edema.  Reasonable to continue the same.  H/o type II MI: Continue statin. Not on aspirin  due to history of GI bleed.  DVT/PE, recurrent GI bleed: Currently, he has IVC filter in place.  I would defer to Dr. Lonn regarding the duration of IVC filter.  In my mind, since there is no acute reversible cause found, he will remain at risk of recurrent DVT PE without IVC filter.  F/u in 6 months  Signed, Newman JINNY Lawrence, MD

## 2024-08-15 NOTE — Patient Instructions (Signed)

## 2024-08-25 ENCOUNTER — Emergency Department (HOSPITAL_COMMUNITY)
Admission: EM | Admit: 2024-08-25 | Discharge: 2024-08-25 | Disposition: A | Discharge status: Home / Self Care | Attending: Emergency Medicine | Admitting: Emergency Medicine

## 2024-08-25 ENCOUNTER — Other Ambulatory Visit: Payer: Self-pay

## 2024-08-25 ENCOUNTER — Encounter (HOSPITAL_COMMUNITY): Payer: Self-pay

## 2024-08-25 ENCOUNTER — Emergency Department (HOSPITAL_COMMUNITY)

## 2024-08-25 DIAGNOSIS — S0081XA Abrasion of other part of head, initial encounter: Secondary | ICD-10-CM | POA: Diagnosis not present

## 2024-08-25 DIAGNOSIS — W1809XA Striking against other object with subsequent fall, initial encounter: Secondary | ICD-10-CM | POA: Diagnosis not present

## 2024-08-25 DIAGNOSIS — Y92007 Garden or yard of unspecified non-institutional (private) residence as the place of occurrence of the external cause: Secondary | ICD-10-CM | POA: Insufficient documentation

## 2024-08-25 DIAGNOSIS — S0990XA Unspecified injury of head, initial encounter: Secondary | ICD-10-CM

## 2024-08-25 LAB — CBG MONITORING, ED: Glucose-Capillary: 151 mg/dL — ABNORMAL HIGH (ref 70–99)

## 2024-08-25 MED ORDER — MUPIROCIN CALCIUM 2 % EX CREA: 1.0000 | TOPICAL_CREAM | Freq: Two times a day (BID) | CUTANEOUS | 0 refills | Status: DC

## 2024-08-25 MED ORDER — BACITRACIN ZINC 500 UNIT/GM EX OINT
TOPICAL_OINTMENT | Freq: Two times a day (BID) | CUTANEOUS | Status: DC
  Administered 2024-08-25: 1 via TOPICAL
  Filled 2024-08-25: qty 0.9

## 2024-08-25 MED ORDER — ACETAMINOPHEN 325 MG PO TABS
325.0000 mg | ORAL_TABLET | Freq: Once | ORAL | Status: AC
  Administered 2024-08-25: 325 mg via ORAL
  Filled 2024-08-25: qty 1

## 2024-08-25 NOTE — ED Triage Notes (Signed)
 Pt reports falling in garden. -LOC, -blood thinners. Denies pain. Abrasions noted to nose and  forehead

## 2024-08-25 NOTE — ED Provider Notes (Signed)
 [place] [facility]MENT AT [facility] Provider Note   CSN: [id number] Arrival date & time: 08/25/24  1845     Patient presents with: Jerry [name] Jerry [name]. is a 83 y.o. male.   HPI   This patient is a very pleasant 83 year old male, not anticoagulated, he is treated for high cholesterol as well as some anxiety, he presents after having an accidental fall in his yard when he bent over to move the sprinkler from 1 part of the yard to another part of the yard.  He lost his balance and stumbled forward could not catch himself and ended up striking the anterior face on the forehead and the nose on a brick, there was no loss of consciousness, minimal bleeding, mostly an abrasion, he does not have any neck pain chest pain shortness of breath numbness or weakness.  He states because of age and a chronic tremor that he has he had difficulty getting up so they called for paramedics.  Paramedics transported the patient without any other significant abnormal findings, they did placed a c-collar prior to arrival  Prior to Admission medications   Medication Sig Start Date End Date Taking? Authorizing Provider  mupirocin cream (BACTROBAN) 2 % Apply 1 Application topically 2 (two) times daily. 08/25/24  Yes Cleotilde Rogue, MD  albuterol  (VENTOLIN  HFA) 108 [phone] Base) MCG/ACT inhaler Inhale 2 puffs into the lungs every 6 (six) hours as needed for wheezing or shortness of breath. 07/12/21   Neysa Rama D, MD  ALPRAZolam  (XANAX ) 0.5 MG tablet Take 0.5 mg by mouth 2 (two) times daily as needed for anxiety.    [provider]  atorvastatin  (LIPITOR) 20 MG tablet Take 1 tablet (20 mg total) by mouth daily. 02/04/24 08/15/24  Christobal Guadalajara, MD  Chlorphen-PE-Acetaminophen  4-10-325 MG TABS Take 1 tablet by mouth daily as needed (For sinus). 02/20/24   [provider]  Cholecalciferol  1000 UNITS tablet Take 1,000 Units by mouth daily.    [provider]  Cyanocobalamin   (VITAMIN B12 PO) Take 1 tablet by mouth daily.    [provider]  DULERA  200-5 MCG/ACT AERO Inhale 2 puffs into the lungs 2 (two) times daily.    [provider]  escitalopram  (LEXAPRO ) 10 MG tablet Take 10 mg by mouth daily.    [provider]  FeFum-FePoly-FA-B Cmp-C-Biot (FOLIVANE-PLUS) CAPS Take 1 capsule by mouth daily.    [provider]  furosemide  (LASIX ) 40 MG tablet Take 40 mg daily only if needed for swelling or weight gain. 04/24/24   Patwardhan, Manish J, MD  ipratropium-albuterol  (DUONEB) 0.5-2.5 (3) MG/3ML SOLN Inhale 3 mLs into the lungs every 2 (two) hours as needed (SOB/Wheezing).    [provider]  memantine  (NAMENDA ) 5 MG tablet Take 10 mg by mouth 2 (two) times daily. 02/17/24   [provider]  OLANZapine  (ZYPREXA ) 2.5 MG tablet Take 1 tablet (2.5 mg total) by mouth 2 (two) times daily as needed (agitation). 06/05/23   Randol Simmonds, MD  pantoprazole  (PROTONIX ) 40 MG tablet Take 1 tablet (40 mg total) by mouth 2 (two) times daily. Patient not taking: Reported on 08/15/2024 02/18/24   Mdala-Gausi, Masiku Agatha, MD    Allergies: Patient has no known allergies.    Review of Systems  All other systems reviewed and are negative.   Updated Vital Signs There were no vitals taken for this visit.  Physical Exam Vitals and nursing note reviewed.  Constitutional:  General: He is not in acute distress.    Appearance: He is well-developed.  HENT:     Head: Normocephalic.     Comments: Abrasion on the mid forehead the bridge of the nose and the tip of the nose, no malocclusion    Mouth/Throat:     Pharynx: No oropharyngeal exudate.  Eyes:     General: No scleral icterus.       Right eye: No discharge.        Left eye: No discharge.     Conjunctiva/sclera: Conjunctivae normal.     Pupils: Pupils are equal, round, and reactive to light.  Neck:     Thyroid : No thyromegaly.     Vascular: No JVD.  Cardiovascular:     Rate  and Rhythm: Normal rate and regular rhythm.     Heart sounds: Normal heart sounds. No murmur heard.    No friction rub. No gallop.  Pulmonary:     Effort: Pulmonary effort is normal. No respiratory distress.     Breath sounds: Normal breath sounds. No wheezing or rales.  Abdominal:     General: Bowel sounds are normal. There is no distension.     Palpations: Abdomen is soft. There is no mass.     Tenderness: There is no abdominal tenderness.  Musculoskeletal:        General: No tenderness. Normal range of motion.     Cervical back: Normal range of motion and neck supple.     Right lower leg: No edema.     Left lower leg: No edema.     Comments: No tenderness over the cervical thoracic or lumbar spines  Lymphadenopathy:     Cervical: No cervical adenopathy.  Skin:    General: Skin is warm and dry.     Findings: No erythema or rash.  Neurological:     Mental Status: He is alert.     Coordination: Coordination normal.     Comments: The patient is awake alert and follows commands, mild tremor of the bilateral upper extremities, speech is clear, memory is intact, coordination is normal otherwise, strength is normal in the bilateral legs.  Psychiatric:        Behavior: Behavior normal.     (all labs ordered are listed, but only abnormal results are displayed) Labs Reviewed  CBG MONITORING, ED - Abnormal; Notable for the following components:      Result Value   Glucose-Capillary 151 (*)    All other components within normal limits    EKG: None  Radiology: CT Head Wo Contrast Result Date: 08/25/2024 EXAM: CT HEAD AND CERVICAL SPINE 08/25/2024 08:28:57 PM TECHNIQUE: CT of the head and cervical spine was performed without the administration of intravenous contrast. Multiplanar reformatted images are provided for review. Automated exposure control, iterative reconstruction, and/or weight based adjustment of the mA/kV was utilized to reduce the radiation dose to as low as reasonably  achievable. COMPARISON: None available. CLINICAL HISTORY: Facial trauma, blunt. FINDINGS: CT HEAD BRAIN AND VENTRICLES: No acute intracranial hemorrhage. No mass effect or midline shift. No abnormal extra-axial fluid collection. No evidence of acute infarct. Global cortical and central atrophy with mild cerebellar predominance. Septum cavum pellucidum. Subcortical and periventricular small vessel ischemic changes. Intracranial atherosclerosis. ORBITS: No acute abnormality. SINUSES AND MASTOIDS: Mild partial opacification of the left maxillary sinus. Mastoid air cells are clear. SOFT TISSUES AND SKULL: No acute skull fracture. No acute soft tissue abnormality. CT CERVICAL SPINE BONES AND ALIGNMENT: No acute fracture or  traumatic malalignment. DEGENERATIVE CHANGES: Degenerative changes with bulky anterior osteophytosis at C5-C6 and C6-C7. SOFT TISSUES: No prevertebral soft tissue swelling. IMPRESSION: 1. No acute intracranial abnormality. 2. No acute traumatic injury of the cervical spine. Electronically signed by: Pinkie Pebbles MD 08/25/2024 08:41 PM EDT RP Workstation: [id number]   CT Cervical Spine Wo Contrast Result Date: 08/25/2024 EXAM: CT HEAD AND CERVICAL SPINE 08/25/2024 08:28:57 PM TECHNIQUE: CT of the head and cervical spine was performed without the administration of intravenous contrast. Multiplanar reformatted images are provided for review. Automated exposure control, iterative reconstruction, and/or weight based adjustment of the mA/kV was utilized to reduce the radiation dose to as low as reasonably achievable. COMPARISON: None available. CLINICAL HISTORY: Facial trauma, blunt. FINDINGS: CT HEAD BRAIN AND VENTRICLES: No acute intracranial hemorrhage. No mass effect or midline shift. No abnormal extra-axial fluid collection. No evidence of acute infarct. Global cortical and central atrophy with mild cerebellar predominance. Septum cavum pellucidum. Subcortical and periventricular small vessel  ischemic changes. Intracranial atherosclerosis. ORBITS: No acute abnormality. SINUSES AND MASTOIDS: Mild partial opacification of the left maxillary sinus. Mastoid air cells are clear. SOFT TISSUES AND SKULL: No acute skull fracture. No acute soft tissue abnormality. CT CERVICAL SPINE BONES AND ALIGNMENT: No acute fracture or traumatic malalignment. DEGENERATIVE CHANGES: Degenerative changes with bulky anterior osteophytosis at C5-C6 and C6-C7. SOFT TISSUES: No prevertebral soft tissue swelling. IMPRESSION: 1. No acute intracranial abnormality. 2. No acute traumatic injury of the cervical spine. Electronically signed by: Pinkie Pebbles MD 08/25/2024 08:41 PM EDT RP Workstation: [id number]     Procedures   Medications Ordered in the ED  bacitracin ointment (has no administration in time range)  acetaminophen  (TYLENOL ) tablet 325 mg (325 mg Oral Given 08/25/24 1919)                                    Medical Decision Making Amount and/or Complexity of Data Reviewed Radiology: ordered.  Risk OTC drugs. Prescription drug management.  Due to the mechanism of injury forcing his head into hyperextension will obtain x-rays of the cervical spine as well as the brain to make sure there is no other injuries, topical antibiotics, sterile dressings and wound care.  The patient is agreeable, Tylenol  ordered.   Radiology Imaging: I personally viewed the images of the ordered radiographic studies and find CT scan of the head and cervical spine without any acute traumatic injuries I agree with the radiologist interpretation as well   Meds / Interventions: while in the ED the patient received the following: Bacitracin, dressing, Tylenol  The response to the interventions was that the patient improvement  The wounds were dressed with topical antibiotics and a sterile dressing  I have discussed with the patient at the bedside the results, and the meaning of these results.  They have had opportunity to  ask questions,  expressed their understanding to the need for follow-up with primary care physician     Final diagnoses:  Minor head injury, initial encounter  Facial abrasion, initial encounter    ED Discharge Orders          Ordered    mupirocin cream (BACTROBAN) 2 %  2 times daily        08/25/24 2055               Cleotilde Rogue, MD 08/25/24 2056

## 2024-08-25 NOTE — Discharge Instructions (Addendum)
 Thankfully your testing has been very reassuring with no signs of significant injuries, no fractures, no bleeding on the brain, no fractures of the visualized structures of the face such as the nose.  Apply topical mupirocin to your open injuries and keep a sterile dressing on.  Return to the ER for worsening symptoms

## 2024-09-18 ENCOUNTER — Inpatient Hospital Stay: Attending: Hematology and Oncology

## 2024-09-18 DIAGNOSIS — D500 Iron deficiency anemia secondary to blood loss (chronic): Secondary | ICD-10-CM | POA: Diagnosis present

## 2024-09-18 DIAGNOSIS — K922 Gastrointestinal hemorrhage, unspecified: Secondary | ICD-10-CM | POA: Diagnosis not present

## 2024-09-18 DIAGNOSIS — D539 Nutritional anemia, unspecified: Secondary | ICD-10-CM

## 2024-09-18 LAB — CBC WITH DIFFERENTIAL (CANCER CENTER ONLY)
Abs Immature Granulocytes: 0.02 K/uL (ref 0.00–0.07)
Basophils Absolute: 0.1 K/uL (ref 0.0–0.1)
Basophils Relative: 1 %
Eosinophils Absolute: 0.4 K/uL (ref 0.0–0.5)
Eosinophils Relative: 6 %
HCT: 38.2 % — ABNORMAL LOW (ref 39.0–52.0)
Hemoglobin: 11 g/dL — ABNORMAL LOW (ref 13.0–17.0)
Immature Granulocytes: 0 %
Lymphocytes Relative: 25 %
Lymphs Abs: 1.8 K/uL (ref 0.7–4.0)
MCH: 22.5 pg — ABNORMAL LOW (ref 26.0–34.0)
MCHC: 28.8 g/dL — ABNORMAL LOW (ref 30.0–36.0)
MCV: 78.1 fL — ABNORMAL LOW (ref 80.0–100.0)
Monocytes Absolute: 0.7 K/uL (ref 0.1–1.0)
Monocytes Relative: 9 %
Neutro Abs: 4.1 K/uL (ref 1.7–7.7)
Neutrophils Relative %: 59 %
Platelet Count: 263 K/uL (ref 150–400)
RBC: 4.89 MIL/uL (ref 4.22–5.81)
RDW: 15.8 % — ABNORMAL HIGH (ref 11.5–15.5)
WBC Count: 7 K/uL (ref 4.0–10.5)
nRBC: 0 % (ref 0.0–0.2)

## 2024-09-18 LAB — SEDIMENTATION RATE: Sed Rate: 16 mm/h (ref 0–16)

## 2024-09-18 LAB — BASIC METABOLIC PANEL - CANCER CENTER ONLY
Anion gap: 4 — ABNORMAL LOW (ref 5–15)
BUN: 31 mg/dL — ABNORMAL HIGH (ref 8–23)
CO2: 33 mmol/L — ABNORMAL HIGH (ref 22–32)
Calcium: 9.9 mg/dL (ref 8.9–10.3)
Chloride: 107 mmol/L (ref 98–111)
Creatinine: 1.95 mg/dL — ABNORMAL HIGH (ref 0.61–1.24)
GFR, Estimated: 34 mL/min — ABNORMAL LOW
Glucose, Bld: 88 mg/dL (ref 70–99)
Potassium: 5 mmol/L (ref 3.5–5.1)
Sodium: 144 mmol/L (ref 135–145)

## 2024-09-18 LAB — FERRITIN: Ferritin: 38 ng/mL (ref 24–336)

## 2024-09-18 LAB — VITAMIN B12: Vitamin B-12: 1144 pg/mL — ABNORMAL HIGH (ref 180–914)

## 2024-09-18 LAB — IRON AND IRON BINDING CAPACITY (CC-WL,HP ONLY)
Iron: 176 ug/dL (ref 45–182)
Saturation Ratios: 56 % — ABNORMAL HIGH (ref 17.9–39.5)
TIBC: 316 ug/dL (ref 250–450)
UIBC: 140 ug/dL (ref 117–376)

## 2024-09-25 ENCOUNTER — Encounter: Payer: Self-pay | Admitting: Hematology and Oncology

## 2024-09-25 ENCOUNTER — Inpatient Hospital Stay: Admitting: Hematology and Oncology

## 2024-09-25 VITALS — BP 125/72 | HR 106 | Temp 98.4°F | Resp 17 | Ht 71.0 in | Wt 194.4 lb

## 2024-09-25 DIAGNOSIS — D539 Nutritional anemia, unspecified: Secondary | ICD-10-CM

## 2024-09-25 DIAGNOSIS — D500 Iron deficiency anemia secondary to blood loss (chronic): Secondary | ICD-10-CM | POA: Diagnosis not present

## 2024-09-25 NOTE — Progress Notes (Signed)
   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 His last venous iron  infusion was in June I reviewed test results with the patient and his wife which show stable blood count and iron  studies For now, there is no indication to start him on erythropoietin  stimulating agent as his hemoglobin was at 11 with adequate iron  replacement I plan to see him again in 3 months for further follow-up He will return a week ahead of time to get labs done    No orders of the defined types were placed in this encounter.   INTERVAL HISTORY: Patient returns for recurrent anemia Symptoms of anemia includes none We reviewed CBC, iron  studies, vitamin B12 level  SUMMARY OF HEMATOLOGIC HISTORY: He was found to have abnormal CBC from recent blood work The patient is known to have dementia.  His wife provided most of the history and I reviewed his records extensively He has significant major comorbidities including prior history of bladder cancer status post surgery and chemotherapy in [place] He moved to 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  His IVC filter is still in situ And going through his diet history, he eats a variety of diet and denies pica He received blood transfusion recently in April as well as 30 years ago when he underwent back surgery.  He is currently taking iron  supplement daily in the morning 1 hour before breakfast On October 09, 2023, colonoscopy revealed large internal hemorrhoids and angioectasias treated On February 22, 2024, EGD revealed scattered inflammation/gastritis April 24, 2024, he started to receive intravenous iron  Feraheme  Lab Results  Component Value Date   VITAMINB12 1,144 (H) 09/18/2024   FERRITIN 38 09/18/2024   HGB 11.0 (L) 09/18/2024   RBC 4.89 09/18/2024   Vitals:   09/25/24 1117  BP: 125/72  Pulse: (!) 106  Resp: 17  Temp: 98.4 F (36.9 C)  SpO2: 98%

## 2024-09-25 NOTE — Assessment & Plan Note (Addendum)
 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 His last venous iron  infusion was in June I reviewed test results with the patient and his wife which show stable blood count and iron  studies For now, there is no indication to start him on erythropoietin  stimulating agent as his hemoglobin was at 11 with adequate iron  replacement I plan to see him again in 3 months for further follow-up He will return a week ahead of time to get labs done

## 2024-11-15 ENCOUNTER — Emergency Department (HOSPITAL_COMMUNITY)

## 2024-11-15 ENCOUNTER — Encounter (HOSPITAL_COMMUNITY): Payer: Self-pay | Admitting: Emergency Medicine

## 2024-11-15 ENCOUNTER — Other Ambulatory Visit: Payer: Self-pay

## 2024-11-15 ENCOUNTER — Inpatient Hospital Stay (HOSPITAL_COMMUNITY)
Admission: EM | Admit: 2024-11-15 | Discharge: 2024-11-26 | DRG: 871 | Disposition: A | Discharge status: Skilled Nursing Facility | Attending: Internal Medicine | Admitting: Internal Medicine

## 2024-11-15 DIAGNOSIS — B961 Klebsiella pneumoniae [K. pneumoniae] as the cause of diseases classified elsewhere: Secondary | ICD-10-CM | POA: Diagnosis present

## 2024-11-15 DIAGNOSIS — J189 Pneumonia, unspecified organism: Secondary | ICD-10-CM | POA: Diagnosis present

## 2024-11-15 DIAGNOSIS — E782 Mixed hyperlipidemia: Secondary | ICD-10-CM | POA: Diagnosis present

## 2024-11-15 DIAGNOSIS — Z95828 Presence of other vascular implants and grafts: Secondary | ICD-10-CM

## 2024-11-15 DIAGNOSIS — E872 Acidosis, unspecified: Secondary | ICD-10-CM | POA: Diagnosis present

## 2024-11-15 DIAGNOSIS — Z87891 Personal history of nicotine dependence: Secondary | ICD-10-CM

## 2024-11-15 DIAGNOSIS — N151 Renal and perinephric abscess: Secondary | ICD-10-CM | POA: Diagnosis present

## 2024-11-15 DIAGNOSIS — R6520 Severe sepsis without septic shock: Secondary | ICD-10-CM | POA: Diagnosis present

## 2024-11-15 DIAGNOSIS — Z833 Family history of diabetes mellitus: Secondary | ICD-10-CM

## 2024-11-15 DIAGNOSIS — Z7951 Long term (current) use of inhaled steroids: Secondary | ICD-10-CM | POA: Diagnosis not present

## 2024-11-15 DIAGNOSIS — J471 Bronchiectasis with (acute) exacerbation: Secondary | ICD-10-CM | POA: Diagnosis present

## 2024-11-15 DIAGNOSIS — N1832 Chronic kidney disease, stage 3b: Secondary | ICD-10-CM | POA: Diagnosis present

## 2024-11-15 DIAGNOSIS — G4733 Obstructive sleep apnea (adult) (pediatric): Secondary | ICD-10-CM | POA: Diagnosis present

## 2024-11-15 DIAGNOSIS — I129 Hypertensive chronic kidney disease with stage 1 through stage 4 chronic kidney disease, or unspecified chronic kidney disease: Secondary | ICD-10-CM | POA: Diagnosis present

## 2024-11-15 DIAGNOSIS — F32A Depression, unspecified: Secondary | ICD-10-CM | POA: Diagnosis present

## 2024-11-15 DIAGNOSIS — A419 Sepsis, unspecified organism: Principal | ICD-10-CM | POA: Diagnosis present

## 2024-11-15 DIAGNOSIS — J4520 Mild intermittent asthma, uncomplicated: Secondary | ICD-10-CM | POA: Diagnosis present

## 2024-11-15 DIAGNOSIS — I10 Essential (primary) hypertension: Secondary | ICD-10-CM | POA: Diagnosis present

## 2024-11-15 DIAGNOSIS — I21A1 Myocardial infarction type 2: Secondary | ICD-10-CM

## 2024-11-15 DIAGNOSIS — Z7984 Long term (current) use of oral hypoglycemic drugs: Secondary | ICD-10-CM

## 2024-11-15 DIAGNOSIS — R7303 Prediabetes: Secondary | ICD-10-CM | POA: Diagnosis present

## 2024-11-15 DIAGNOSIS — F0153 Vascular dementia, unspecified severity, with mood disturbance: Secondary | ICD-10-CM | POA: Diagnosis present

## 2024-11-15 DIAGNOSIS — N281 Cyst of kidney, acquired: Secondary | ICD-10-CM | POA: Diagnosis present

## 2024-11-15 DIAGNOSIS — E119 Type 2 diabetes mellitus without complications: Secondary | ICD-10-CM

## 2024-11-15 DIAGNOSIS — N179 Acute kidney failure, unspecified: Secondary | ICD-10-CM | POA: Diagnosis present

## 2024-11-15 DIAGNOSIS — Z8551 Personal history of malignant neoplasm of bladder: Secondary | ICD-10-CM

## 2024-11-15 DIAGNOSIS — J470 Bronchiectasis with acute lower respiratory infection: Secondary | ICD-10-CM | POA: Diagnosis present

## 2024-11-15 DIAGNOSIS — Z87442 Personal history of urinary calculi: Secondary | ICD-10-CM

## 2024-11-15 DIAGNOSIS — I2510 Atherosclerotic heart disease of native coronary artery without angina pectoris: Secondary | ICD-10-CM | POA: Diagnosis present

## 2024-11-15 DIAGNOSIS — F0283 Dementia in other diseases classified elsewhere, unspecified severity, with mood disturbance: Secondary | ICD-10-CM | POA: Diagnosis present

## 2024-11-15 DIAGNOSIS — R7989 Other specified abnormal findings of blood chemistry: Secondary | ICD-10-CM | POA: Diagnosis present

## 2024-11-15 DIAGNOSIS — E875 Hyperkalemia: Secondary | ICD-10-CM | POA: Diagnosis present

## 2024-11-15 DIAGNOSIS — Z9049 Acquired absence of other specified parts of digestive tract: Secondary | ICD-10-CM

## 2024-11-15 DIAGNOSIS — Z79899 Other long term (current) drug therapy: Secondary | ICD-10-CM

## 2024-11-15 DIAGNOSIS — Z8546 Personal history of malignant neoplasm of prostate: Secondary | ICD-10-CM

## 2024-11-15 DIAGNOSIS — F0150 Vascular dementia without behavioral disturbance: Secondary | ICD-10-CM | POA: Diagnosis present

## 2024-11-15 DIAGNOSIS — Z1152 Encounter for screening for COVID-19: Secondary | ICD-10-CM | POA: Diagnosis not present

## 2024-11-15 DIAGNOSIS — Z86711 Personal history of pulmonary embolism: Secondary | ICD-10-CM | POA: Diagnosis not present

## 2024-11-15 DIAGNOSIS — G20A1 Parkinson's disease without dyskinesia, without mention of fluctuations: Secondary | ICD-10-CM | POA: Diagnosis present

## 2024-11-15 DIAGNOSIS — R509 Fever, unspecified: Secondary | ICD-10-CM | POA: Diagnosis not present

## 2024-11-15 DIAGNOSIS — R7982 Elevated C-reactive protein (CRP): Secondary | ICD-10-CM | POA: Diagnosis present

## 2024-11-15 LAB — URINALYSIS, W/ REFLEX TO CULTURE (INFECTION SUSPECTED)
Bilirubin Urine: NEGATIVE
Glucose, UA: NEGATIVE mg/dL
Ketones, ur: NEGATIVE mg/dL
Nitrite: NEGATIVE
Protein, ur: 100 mg/dL — AB
Specific Gravity, Urine: 1.011 (ref 1.005–1.030)
pH: 9 — ABNORMAL HIGH (ref 5.0–8.0)

## 2024-11-15 LAB — MAGNESIUM: Magnesium: 2 mg/dL (ref 1.7–2.4)

## 2024-11-15 LAB — COMPREHENSIVE METABOLIC PANEL WITH GFR
ALT: 10 U/L (ref 0–44)
AST: 17 U/L (ref 15–41)
Albumin: 4.1 g/dL (ref 3.5–5.0)
Alkaline Phosphatase: 85 U/L (ref 38–126)
Anion gap: 11 (ref 5–15)
BUN: 57 mg/dL — ABNORMAL HIGH (ref 8–23)
CO2: 23 mmol/L (ref 22–32)
Calcium: 9.7 mg/dL (ref 8.9–10.3)
Chloride: 104 mmol/L (ref 98–111)
Creatinine, Ser: 2.6 mg/dL — ABNORMAL HIGH (ref 0.61–1.24)
GFR, Estimated: 24 mL/min — ABNORMAL LOW
Glucose, Bld: 111 mg/dL — ABNORMAL HIGH (ref 70–99)
Potassium: 5.4 mmol/L — ABNORMAL HIGH (ref 3.5–5.1)
Sodium: 138 mmol/L (ref 135–145)
Total Bilirubin: 0.6 mg/dL (ref 0.0–1.2)
Total Protein: 6.9 g/dL (ref 6.5–8.1)

## 2024-11-15 LAB — I-STAT CHEM 8, ED
BUN: 56 mg/dL — ABNORMAL HIGH (ref 8–23)
Calcium, Ion: 1.21 mmol/L (ref 1.15–1.40)
Chloride: 106 mmol/L (ref 98–111)
Creatinine, Ser: 2.9 mg/dL — ABNORMAL HIGH (ref 0.61–1.24)
Glucose, Bld: 112 mg/dL — ABNORMAL HIGH (ref 70–99)
HCT: 36 % — ABNORMAL LOW (ref 39.0–52.0)
Hemoglobin: 12.2 g/dL — ABNORMAL LOW (ref 13.0–17.0)
Potassium: 5.2 mmol/L — ABNORMAL HIGH (ref 3.5–5.1)
Sodium: 139 mmol/L (ref 135–145)
TCO2: 23 mmol/L (ref 22–32)

## 2024-11-15 LAB — CBC WITH DIFFERENTIAL/PLATELET
Abs Immature Granulocytes: 0.48 K/uL — ABNORMAL HIGH (ref 0.00–0.07)
Basophils Absolute: 0.1 K/uL (ref 0.0–0.1)
Basophils Relative: 0 %
Eosinophils Absolute: 0 K/uL (ref 0.0–0.5)
Eosinophils Relative: 0 %
HCT: 36.6 % — ABNORMAL LOW (ref 39.0–52.0)
Hemoglobin: 10.5 g/dL — ABNORMAL LOW (ref 13.0–17.0)
Immature Granulocytes: 2 %
Lymphocytes Relative: 7 %
Lymphs Abs: 1.8 K/uL (ref 0.7–4.0)
MCH: 22.3 pg — ABNORMAL LOW (ref 26.0–34.0)
MCHC: 28.7 g/dL — ABNORMAL LOW (ref 30.0–36.0)
MCV: 77.7 fL — ABNORMAL LOW (ref 80.0–100.0)
Monocytes Absolute: 2.8 K/uL — ABNORMAL HIGH (ref 0.1–1.0)
Monocytes Relative: 12 %
Neutro Abs: 18.8 K/uL — ABNORMAL HIGH (ref 1.7–7.7)
Neutrophils Relative %: 79 %
Platelets: 235 K/uL (ref 150–400)
RBC: 4.71 MIL/uL (ref 4.22–5.81)
RDW: 16 % — ABNORMAL HIGH (ref 11.5–15.5)
WBC: 24 K/uL — ABNORMAL HIGH (ref 4.0–10.5)
nRBC: 0 % (ref 0.0–0.2)

## 2024-11-15 LAB — PROTIME-INR
INR: 1.3 — ABNORMAL HIGH (ref 0.8–1.2)
Prothrombin Time: 16.8 s — ABNORMAL HIGH (ref 11.4–15.2)

## 2024-11-15 LAB — I-STAT CG4 LACTIC ACID, ED
Lactic Acid, Venous: 2 mmol/L (ref 0.5–1.9)
Lactic Acid, Venous: 1 mmol/L (ref 0.5–1.9)

## 2024-11-15 LAB — RESP PANEL BY RT-PCR (RSV, FLU A&B, COVID)  RVPGX2
Influenza A by PCR: NEGATIVE
Influenza B by PCR: NEGATIVE
Resp Syncytial Virus by PCR: NEGATIVE
SARS Coronavirus 2 by RT PCR: NEGATIVE

## 2024-11-15 LAB — I-STAT VENOUS BLOOD GAS, ED
Acid-base deficit: 2 mmol/L (ref 0.0–2.0)
Bicarbonate: 23.3 mmol/L (ref 20.0–28.0)
Calcium, Ion: 1.21 mmol/L (ref 1.15–1.40)
HCT: 36 % — ABNORMAL LOW (ref 39.0–52.0)
Hemoglobin: 12.2 g/dL — ABNORMAL LOW (ref 13.0–17.0)
O2 Saturation: 81 %
Potassium: 5.2 mmol/L — ABNORMAL HIGH (ref 3.5–5.1)
Sodium: 140 mmol/L (ref 135–145)
TCO2: 25 mmol/L (ref 22–32)
pCO2, Ven: 40.8 mmHg — ABNORMAL LOW (ref 44–60)
pH, Ven: 7.365 (ref 7.25–7.43)
pO2, Ven: 47 mmHg — ABNORMAL HIGH (ref 32–45)

## 2024-11-15 LAB — TROPONIN T, HIGH SENSITIVITY
Troponin T High Sensitivity: 46 ng/L — ABNORMAL HIGH (ref 0–19)
Troponin T High Sensitivity: 41 ng/L — ABNORMAL HIGH (ref 0–19)

## 2024-11-15 LAB — D-DIMER, QUANTITATIVE: D-Dimer, Quant: 4.61 ug{FEU}/mL — ABNORMAL HIGH (ref 0.00–0.50)

## 2024-11-15 LAB — PROCALCITONIN: Procalcitonin: 8.25 ng/mL

## 2024-11-15 LAB — PRO BRAIN NATRIURETIC PEPTIDE: Pro Brain Natriuretic Peptide: 663 pg/mL — ABNORMAL HIGH

## 2024-11-15 MED ORDER — ONDANSETRON HCL 4 MG PO TABS: 4.0000 mg | ORAL_TABLET | Freq: Four times a day (QID) | ORAL | Status: DC | PRN

## 2024-11-15 MED ORDER — ESCITALOPRAM OXALATE 10 MG PO TABS
10.0000 mg | ORAL_TABLET | Freq: Every day | ORAL | Status: DC
  Administered 2024-11-16 – 2024-11-25 (×9): 10 mg via ORAL
  Filled 2024-11-15 (×9): qty 1

## 2024-11-15 MED ORDER — FLUTICASONE FUROATE-VILANTEROL 200-25 MCG/ACT IN AEPB: 1.0000 | INHALATION_SPRAY | Freq: Every day | RESPIRATORY_TRACT | Status: DC

## 2024-11-15 MED ORDER — SODIUM CHLORIDE 0.9 % IV SOLN: INTRAVENOUS | Status: DC

## 2024-11-15 MED ORDER — MEMANTINE HCL 10 MG PO TABS
10.0000 mg | ORAL_TABLET | Freq: Two times a day (BID) | ORAL | Status: DC
  Administered 2024-11-15 – 2024-11-26 (×21): 10 mg via ORAL
  Filled 2024-11-15 (×21): qty 1

## 2024-11-15 MED ORDER — VANCOMYCIN HCL IN DEXTROSE 1-5 GM/200ML-% IV SOLN: 1000.0000 mg | Freq: Once | INTRAVENOUS | Status: DC

## 2024-11-15 MED ORDER — ONDANSETRON HCL 4 MG/2ML IJ SOLN: 4.0000 mg | Freq: Four times a day (QID) | INTRAMUSCULAR | Status: DC | PRN

## 2024-11-15 MED ORDER — ENOXAPARIN SODIUM 30 MG/0.3ML IJ SOSY
30.0000 mg | PREFILLED_SYRINGE | INTRAMUSCULAR | Status: DC
  Administered 2024-11-16 – 2024-11-20 (×5): 30 mg via SUBCUTANEOUS
  Filled 2024-11-15 (×5): qty 0.3

## 2024-11-15 MED ORDER — METRONIDAZOLE 500 MG/100ML IV SOLN
500.0000 mg | Freq: Two times a day (BID) | INTRAVENOUS | Status: DC
  Administered 2024-11-15 – 2024-11-17 (×3): 500 mg via INTRAVENOUS
  Filled 2024-11-15 (×3): qty 100

## 2024-11-15 MED ORDER — VANCOMYCIN HCL 1500 MG/300ML IV SOLN
1500.0000 mg | Freq: Once | INTRAVENOUS | Status: AC
  Administered 2024-11-15: 1500 mg via INTRAVENOUS
  Filled 2024-11-15: qty 300

## 2024-11-15 MED ORDER — ACETAMINOPHEN 650 MG RE SUPP: 650.0000 mg | Freq: Four times a day (QID) | RECTAL | Status: DC | PRN

## 2024-11-15 MED ORDER — ALPRAZOLAM 0.5 MG PO TABS
0.5000 mg | ORAL_TABLET | Freq: Two times a day (BID) | ORAL | Status: DC | PRN
  Administered 2024-11-16 – 2024-11-25 (×5): 0.5 mg via ORAL
  Filled 2024-11-15 (×5): qty 1

## 2024-11-15 MED ORDER — METHYLPREDNISOLONE SODIUM SUCC 125 MG IJ SOLR
125.0000 mg | Freq: Once | INTRAMUSCULAR | Status: AC
  Administered 2024-11-15: 125 mg via INTRAVENOUS
  Filled 2024-11-15: qty 2

## 2024-11-15 MED ORDER — SODIUM CHLORIDE 0.9% FLUSH
3.0000 mL | Freq: Two times a day (BID) | INTRAVENOUS | Status: DC
  Administered 2024-11-17 – 2024-11-26 (×17): 3 mL via INTRAVENOUS

## 2024-11-15 MED ORDER — SODIUM CHLORIDE 0.9 % IV SOLN
2.0000 g | INTRAVENOUS | Status: DC
  Administered 2024-11-16: 2 g via INTRAVENOUS
  Filled 2024-11-15: qty 12.5

## 2024-11-15 MED ORDER — ACETAMINOPHEN 325 MG PO TABS
650.0000 mg | ORAL_TABLET | Freq: Once | ORAL | Status: AC
  Administered 2024-11-15: 650 mg via ORAL
  Filled 2024-11-15: qty 2

## 2024-11-15 MED ORDER — VANCOMYCIN VARIABLE DOSE PER UNSTABLE RENAL FUNCTION (PHARMACIST DOSING): Status: DC

## 2024-11-15 MED ORDER — ACETAMINOPHEN 325 MG PO TABS
650.0000 mg | ORAL_TABLET | Freq: Four times a day (QID) | ORAL | Status: DC | PRN
  Administered 2024-11-16 – 2024-11-21 (×9): 650 mg via ORAL
  Filled 2024-11-15 (×9): qty 2

## 2024-11-15 MED ORDER — OLANZAPINE 5 MG PO TABS: 2.5000 mg | ORAL_TABLET | Freq: Two times a day (BID) | ORAL | Status: DC | PRN

## 2024-11-15 MED ORDER — IPRATROPIUM-ALBUTEROL 0.5-2.5 (3) MG/3ML IN SOLN
3.0000 mL | Freq: Once | RESPIRATORY_TRACT | Status: AC
  Administered 2024-11-15: 3 mL via RESPIRATORY_TRACT
  Filled 2024-11-15: qty 3

## 2024-11-15 MED ORDER — LACTATED RINGERS IV BOLUS (SEPSIS)
1000.0000 mL | Freq: Once | INTRAVENOUS | Status: AC
  Administered 2024-11-15: 1000 mL via INTRAVENOUS

## 2024-11-15 MED ORDER — SODIUM CHLORIDE 0.9 % IV SOLN
2.0000 g | Freq: Once | INTRAVENOUS | Status: AC
  Administered 2024-11-15: 2 g via INTRAVENOUS
  Filled 2024-11-15: qty 12.5

## 2024-11-15 MED ORDER — METRONIDAZOLE 500 MG/100ML IV SOLN
500.0000 mg | Freq: Once | INTRAVENOUS | Status: AC
  Administered 2024-11-15: 500 mg via INTRAVENOUS
  Filled 2024-11-15: qty 100

## 2024-11-15 NOTE — Assessment & Plan Note (Signed)
-   s/p radical cystoprostatectomy with ileal conduit

## 2024-11-15 NOTE — Assessment & Plan Note (Signed)
-   patient has history of CKD3b. Baseline creat ~ 1.8 - 2, eGFR~ 35 - patient presents with increase in creat >0.3 mg/dL above baseline or creat increase >1.5x baseline presumed to have occurred within past 7 days PTA - creat 2.6 on admission; suspect volume depletion from weakness and poor intake over the past week - Received LR bolus in the ER - Continue on fluids for now which also may fluff out pneumonia

## 2024-11-15 NOTE — Assessment & Plan Note (Signed)
-   Last A1c 6.2% on 12/27/2023 - Update A1c in the morning - Liberate diet for now given poor intake at home prior to admission

## 2024-11-15 NOTE — Progress Notes (Signed)
 Pharmacy Antibiotic Note  Jerry D Hout Jr. is a 84 y.o. male for which pharmacy has been consulted for cefepime  and vancomycin  dosing for sepsis.  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 Patient presenting with weakness.  SCr 2.9 - above baseline WBC 24; LA 1; T 101.4>98.3; HR 102>92; RR 26>23 COVID neg / flu neg  Plan: Cefepime  2g q24hr  Vancomycin  1500 mg once, subsequent dosing as indicated per random vancomycin  level until renal function stable and/or improved, at which time scheduled dosing can be considered Monitor WBC, fever, renal function, cultures De-escalate when able  Height: 5' 11 (180.3 cm) Weight: 83.9 kg (185 lb) IBW/kg (Calculated) : 75.3  Temp (24hrs), Avg:99.9 F (37.7 C), Min:98.3 F (36.8 C), Max:101.4 F (38.6 C)  Recent Labs  Lab 11/15/24 1208 11/15/24 1233 11/15/24 1240 11/15/24 1434  WBC 24.0*  --   --   --   CREATININE 2.60*  --  2.90*  --   LATICACIDVEN  --  2.0*  --  1.0    Estimated Creatinine Clearance: 21.3 mL/min (A) (by C-G formula based on SCr of 2.9 mg/dL (H)).    Allergies[1]  Microbiology results: Pending  Thank you for allowing pharmacy to be a part of this patients care.  Dorn Buttner, PharmD, BCPS 11/15/2024 4:59 PM ED Clinical Pharmacist -  [phone]       [1] No Known Allergies

## 2024-11-15 NOTE — H&P (Signed)
 " History and Physical    Jerry [name].  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 Cared for at home by wife.  He was brought in due to severe weakness and lethargy.  Symptoms progressed over approximately the past week.  They had a gathering of friends 6 days ago and she states he has gotten progressively weaker and more tired since.  She has not been able to delineate any specific change in physical symptoms.  Urine typically has strong odor which is unchanged and no change in urostomy output otherwise.  Chronic cough which is also unchanged.  No reports of fever.  On workup in the ER he was febrile, 101.4 F, tachycardic in the 110s, tachypneic low 20s. Creatinine slightly worse than usual baseline with elevated BUN as well.  Potassium 5.4. WBC also elevated, 24, 2% bands. proBNP 663 D-dimer 4.61.  Given history of PE despite IVC in place, a VQ scan was ordered in the ER.  Urinalysis noted with large LE, negative nitrite, 0-5 WBC, rare bacteria. Lactic acid 2 initially.  He received vancomycin , cefepime , and Flagyl  in the ER and is admitted for ongoing workup.  I have personally briefly reviewed patient's old medical records in [facility] and discussed patient with the ER provider when appropriate/indicated.  Assessment and Plan: * Severe sepsis (HCC) - Febrile, tachycardia, tachypnea, leukocytosis, lactic acidosis - Still unclear source but most likely pulmonary given CT chest findings with left greater than right infiltrates but also underlying bronchiectasis noted -Check RVP.  Negative COVID, flu, RSV - check PCT and trend  -  Continue vancomycin , cefepime , Flagyl  -Follow-up blood cultures  History of pulmonary embolism - CT angio chest 02/15/2024 showed bilateral lower lobe PE - Now has IVC in place; placed by vascular surgery on 02/22/2024 -Follow-up VQ scan  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) - patient has history of CKD3b. Baseline creat ~ 1.8 - 2, eGFR~ 35 - patient presents with increase in creat >0.3 mg/dL above baseline or creat increase >1.5x baseline presumed to have occurred within past 7 days PTA - creat 2.6 on admission; suspect volume depletion from weakness and poor intake over the past week - Received LR bolus in the ER - Continue on fluids for now which also may fluff out pneumonia   Hyperkalemia - From worsened renal function - Continue fluids and repeat BMP in a.m.  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     Code Status:     Code Status: Full Code  DVT Prophylaxis: Lovenox      Anticipated disposition is to: Home  History: Past Medical History:  Diagnosis Date   Allergic rhinitis 07/21/2021   Anemia 04/17/2015   Arthritis    Asthma    Asthma, mild  intermittent    Benign essential hypertension 07/21/2021   Bladder cancer (HCC) 2019   CAP (community acquired pneumonia) 06/26/2023   Degenerative cervical spinal stenosis 07/21/2021   Faintness    History of malignant neoplasm of bladder 07/21/2021   Hyperglycemia 04/17/2015   Hypertension    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   Kidney stone    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   Malnutrition of  moderate degree 06/28/2023   Mixed hyperlipidemia 07/21/2021   OSA (obstructive sleep apnea) 07/12/2021   NPSG- 03/16/21- AHI 10.3/ hr, desaturation to 87%, body weight 190 lbs     Renal insufficiency 04/17/2015   Syncope 04/17/2015   Tremor 07/21/2021   Vascular dementia (HCC)    Vitamin D  deficiency 07/21/2021    Past Surgical History:  Procedure Laterality Date   BACK SURGERY     BIOPSY  10/09/2023   Procedure: BIOPSY;  Surgeon: Elicia Claw, MD;  Location: [facility] ENDOSCOPY;  Service: Gastroenterology;;   CHOLECYSTECTOMY     COLONOSCOPY WITH PROPOFOL  Bilateral 10/09/2023   Procedure: COLONOSCOPY WITH PROPOFOL ;  Surgeon: Elicia Claw, MD;  Location: [facility];  Service: Gastroenterology;  Laterality: Bilateral;   ESOPHAGOGASTRODUODENOSCOPY N/A 02/22/2024   Procedure: EGD (ESOPHAGOGASTRODUODENOSCOPY);  Surgeon: Elicia Claw, MD;  Location: [facility] ENDOSCOPY;  Service: Gastroenterology;  Laterality: N/A;   ESOPHAGOGASTRODUODENOSCOPY (EGD) WITH PROPOFOL  Bilateral 10/09/2023   Procedure: ESOPHAGOGASTRODUODENOSCOPY (EGD) WITH PROPOFOL ;  Surgeon: Elicia Claw, MD;  Location: [facility] ENDOSCOPY;  Service: Gastroenterology;  Laterality: Bilateral;   HOT HEMOSTASIS N/A 10/09/2023   Procedure: HOT HEMOSTASIS (ARGON PLASMA COAGULATION/BICAP);  Surgeon: Elicia Claw, MD;  Location: [facility];  Service: Gastroenterology;  Laterality: N/A;   KNEE SURGERY Left    LOWER EXTREMITY VENOGRAPHY  01/29/2024   Procedure: LOWER EXTREMITY VENOGRAPHY;  Surgeon: Jordan, Peter M, MD;  Location: [facility] INVASIVE CV LAB;  Service: Cardiovascular;;   RIGHT/LEFT HEART CATH AND CORONARY ANGIOGRAPHY N/A 01/29/2024   Procedure: RIGHT/LEFT HEART CATH AND CORONARY ANGIOGRAPHY;  Surgeon: Jordan, Peter M, MD;  Location: [facility] INVASIVE CV LAB;  Service: Cardiovascular;  Laterality: N/A;   SHOULDER SURGERY     for rotator cuff tear   VENA CAVA UMBRELLA NECK APPROACH Right 02/22/2024   Procedure: INSERTION, UMBRELLA  FILTER, INFERIOR VENA CAVA;  Surgeon: Sheree Penne Bruckner, MD;  Location: [facility] OR;  Service: Vascular;  Laterality: Right;     reports that he has never smoked. He has never used smokeless tobacco. He reports current alcohol use of about 3.0 standard drinks of alcohol per week. He reports current drug use. Drug: Marijuana.  Allergies[1]  Family History  Problem Relation Age of Onset   Diabetes Mother    Diabetes Father     Home Medications: Prior to Admission medications  Medication Sig Start Date End Date Taking? Authorizing Provider  albuterol  (VENTOLIN  HFA) 108 (90 Base) MCG/ACT inhaler Inhale 2 puffs into the lungs every 6 (six) hours as needed for wheezing or shortness of breath. 07/12/21   Neysa Rama D, MD  ALPRAZolam  (XANAX ) 0.5 MG tablet Take 0.5 mg by mouth 2 (two) times daily as needed for anxiety.    [provider]  atorvastatin  (LIPITOR) 20 MG tablet Take 1 tablet (20 mg total) by mouth daily. 02/04/24 08/15/24  Christobal Guadalajara, MD  Chlorphen-PE-Acetaminophen  4-10-325 MG TABS Take 1 tablet by mouth daily as needed (For sinus). 02/20/24   [provider]  Cholecalciferol  1000 UNITS tablet Take 1,000 Units by mouth daily.    [provider]  Cyanocobalamin  (VITAMIN B12 PO) Take  1 tablet by mouth daily.    [provider]  DULERA  200-5 MCG/ACT AERO Inhale 2 puffs into the lungs 2 (two) times daily.    [provider]  escitalopram  (LEXAPRO ) 10 MG tablet Take 10 mg by mouth daily.    [provider]  FeFum-FePoly-FA-B Cmp-C-Biot (FOLIVANE-PLUS) CAPS Take 1 capsule by mouth daily.    [provider]  furosemide  (LASIX ) 40 MG tablet Take 40 mg daily only if needed for swelling or weight gain. 04/24/24   Patwardhan, Newman PARAS, MD  ipratropium-albuterol  (DUONEB) 0.5-2.5 (3) MG/3ML SOLN Inhale 3 mLs into the lungs every 2 (two) hours as needed (SOB/Wheezing).    [provider]  memantine  (NAMENDA ) 5 MG tablet Take 10  mg by mouth 2 (two) times daily. 02/17/24   [provider]  OLANZapine  (ZYPREXA ) 2.5 MG tablet Take 1 tablet (2.5 mg total) by mouth 2 (two) times daily as needed (agitation). 06/05/23   Randol Simmonds, MD  pantoprazole  (PROTONIX ) 40 MG tablet Take 1 tablet (40 mg total) by mouth 2 (two) times daily. Patient not taking: Reported on 08/15/2024 02/18/24   Mdala-Gausi, Golden Pillow, MD    Review of Systems:  Review of Systems  Constitutional:  Positive for fever and malaise/fatigue.  HENT: Negative.    Eyes: Negative.   Respiratory: Negative.    Cardiovascular: Negative.   Gastrointestinal: Negative.   Genitourinary: Negative.   Musculoskeletal: Negative.   Skin: Negative.   Neurological: Negative.   Endo/Heme/Allergies: Negative.   Psychiatric/Behavioral: Negative.      Physical Exam:  Vitals:   11/15/24 1330 11/15/24 1445 11/15/24 1515 11/15/24 1618  BP: 125/71 (!) 140/89 136/82   Pulse: (!) 109 (!) 107 (!) 102   Resp: (!) 21 (!) 23 (!) 26   Temp:    98.3 F (36.8 C)  TempSrc:    Axillary  SpO2: 96% 94% 96%   Weight:      Height:       Physical Exam Constitutional:      General: He is not in acute distress.    Comments: Elderly gentleman lying in bed severely weak and in no distress.  Follows commands but significant weakness throughout  HENT:     Head: Normocephalic and atraumatic.     Mouth/Throat:     Mouth: Mucous membranes are moist.  Eyes:     Extraocular Movements: Extraocular movements intact.  Cardiovascular:     Rate and Rhythm: Normal rate and regular rhythm.  Pulmonary:     Effort: Pulmonary effort is normal. No respiratory distress.     Breath sounds: Rhonchi present. No wheezing.     Comments: Coarse breath sounds bilaterally anteriolateral lung fields Abdominal:     General: Bowel sounds are normal. There is no distension.     Palpations: Abdomen is soft.     Tenderness: There is no abdominal tenderness.  Genitourinary:    Comments: Urostomy bag  in place with yellow urine noted in Foley bag Musculoskeletal:        General: Normal range of motion.     Cervical back: Normal range of motion and neck supple.  Skin:    General: Skin is warm and dry.  Neurological:     Mental Status: Mental status is at baseline.     Motor: Weakness (Symmetric and diffuse weakness throughout) present.      Labs on Admission:  I have personally reviewed following labs and imaging studies Results for orders placed or performed during the  hospital encounter of 11/15/24 (from the past 24 hours)  Comprehensive metabolic panel     Status: Abnormal   Collection Time: 11/15/24 12:08 PM  Result Value Ref Range   Sodium 138 135 - 145 mmol/L   Potassium 5.4 (H) 3.5 - 5.1 mmol/L   Chloride 104 98 - 111 mmol/L   CO2 23 22 - 32 mmol/L   Glucose, Bld 111 (H) 70 - 99 mg/dL   BUN 57 (H) 8 - 23 mg/dL   Creatinine, Ser 7.39 (H) 0.61 - 1.24 mg/dL   Calcium  9.7 8.9 - 10.3 mg/dL   Total Protein 6.9 6.5 - 8.1 g/dL   Albumin  4.1 3.5 - 5.0 g/dL   AST 17 15 - 41 U/L   ALT 10 0 - 44 U/L   Alkaline Phosphatase 85 38 - 126 U/L   Total Bilirubin 0.6 0.0 - 1.2 mg/dL   GFR, Estimated 24 (L) >60 mL/min   Anion gap 11 5 - 15  CBC with Differential     Status: Abnormal   Collection Time: 11/15/24 12:08 PM  Result Value Ref Range   WBC 24.0 (H) 4.0 - 10.5 K/uL   RBC 4.71 4.22 - 5.81 MIL/uL   Hemoglobin 10.5 (L) 13.0 - 17.0 g/dL   HCT 63.3 (L) 60.9 - 47.9 %   MCV 77.7 (L) 80.0 - 100.0 fL   MCH 22.3 (L) 26.0 - 34.0 pg   MCHC 28.7 (L) 30.0 - 36.0 g/dL   RDW 83.9 (H) 88.4 - 84.4 %   Platelets 235 150 - 400 K/uL   nRBC 0.0 0.0 - 0.2 %   Neutrophils Relative % 79 %   Neutro Abs 18.8 (H) 1.7 - 7.7 K/uL   Lymphocytes Relative 7 %   Lymphs Abs 1.8 0.7 - 4.0 K/uL   Monocytes Relative 12 %   Monocytes Absolute 2.8 (H) 0.1 - 1.0 K/uL   Eosinophils Relative 0 %   Eosinophils Absolute 0.0 0.0 - 0.5 K/uL   Basophils Relative 0 %   Basophils Absolute 0.1 0.0 - 0.1 K/uL    Immature Granulocytes 2 %   Abs Immature Granulocytes 0.48 (H) 0.00 - 0.07 K/uL  Protime-INR     Status: Abnormal   Collection Time: 11/15/24 12:08 PM  Result Value Ref Range   Prothrombin Time 16.8 (H) 11.4 - 15.2 seconds   INR 1.3 (H) 0.8 - 1.2  Pro Brain natriuretic peptide     Status: Abnormal   Collection Time: 11/15/24 12:08 PM  Result Value Ref Range   Pro Brain Natriuretic Peptide 663.0 (H) <300.0 pg/mL  Troponin T, High Sensitivity     Status: Abnormal   Collection Time: 11/15/24 12:08 PM  Result Value Ref Range   Troponin T High Sensitivity 46 (H) 0 - 19 ng/L  Magnesium      Status: None   Collection Time: 11/15/24 12:08 PM  Result Value Ref Range   Magnesium  2.0 1.7 - 2.4 mg/dL  D-dimer, quantitative     Status: Abnormal   Collection Time: 11/15/24 12:08 PM  Result Value Ref Range   D-Dimer, Quant 4.61 (H) 0.00 - 0.50 ug/mL-FEU  Urinalysis, w/ Reflex to Culture (Infection Suspected) -Urine, Clean Catch     Status: Abnormal   Collection Time: 11/15/24 12:09 PM  Result Value Ref Range   Specimen Source URINE, CLEAN CATCH    Color, Urine YELLOW YELLOW   APPearance CLOUDY (A) CLEAR   Specific Gravity, Urine 1.011 1.005 - 1.030   pH 9.0 (  H) 5.0 - 8.0   Glucose, UA NEGATIVE NEGATIVE mg/dL   Hgb urine dipstick SMALL (A) NEGATIVE   Bilirubin Urine NEGATIVE NEGATIVE   Ketones, ur NEGATIVE NEGATIVE mg/dL   Protein, ur 899 (A) NEGATIVE mg/dL   Nitrite NEGATIVE NEGATIVE   Leukocytes,Ua LARGE (A) NEGATIVE   RBC / HPF 0-5 0 - 5 RBC/hpf   WBC, UA 0-5 0 - 5 WBC/hpf   Bacteria, UA RARE (A) NONE SEEN   Squamous Epithelial / HPF 0-5 0 - 5 /HPF   Mucus PRESENT    Amorphous Crystal PRESENT   I-Stat venous blood gas, ED ([facility])     Status: Abnormal   Collection Time: 11/15/24 12:32 PM  Result Value Ref Range   pH, Ven 7.365 7.25 - 7.43   pCO2, Ven 40.8 (L) 44 - 60 mmHg   pO2, Ven 47 (H) 32 - 45 mmHg   Bicarbonate 23.3 20.0 - 28.0 mmol/L   TCO2 25 22 - 32 mmol/L   O2  Saturation 81 %   Acid-base deficit 2.0 0.0 - 2.0 mmol/L   Sodium 140 135 - 145 mmol/L   Potassium 5.2 (H) 3.5 - 5.1 mmol/L   Calcium , Ion 1.21 1.15 - 1.40 mmol/L   HCT 36.0 (L) 39.0 - 52.0 %   Hemoglobin 12.2 (L) 13.0 - 17.0 g/dL   Sample type VENOUS   I-Stat Lactic Acid, ED     Status: Abnormal   Collection Time: 11/15/24 12:33 PM  Result Value Ref Range   Lactic Acid, Venous 2.0 (HH) 0.5 - 1.9 mmol/L  I-Stat Chem 8, ED     Status: Abnormal   Collection Time: 11/15/24 12:40 PM  Result Value Ref Range   Sodium 139 135 - 145 mmol/L   Potassium 5.2 (H) 3.5 - 5.1 mmol/L   Chloride 106 98 - 111 mmol/L   BUN 56 (H) 8 - 23 mg/dL   Creatinine, Ser 7.09 (H) 0.61 - 1.24 mg/dL   Glucose, Bld 887 (H) 70 - 99 mg/dL   Calcium , Ion 1.21 1.15 - 1.40 mmol/L   TCO2 23 22 - 32 mmol/L   Hemoglobin 12.2 (L) 13.0 - 17.0 g/dL   HCT 63.9 (L) 60.9 - 47.9 %  Resp panel by RT-PCR (RSV, Flu A&B, Covid) Anterior Nasal Swab     Status: None   Collection Time: 11/15/24 12:46 PM   Specimen: Anterior Nasal Swab  Result Value Ref Range   SARS Coronavirus 2 by RT PCR NEGATIVE NEGATIVE   Influenza A by PCR NEGATIVE NEGATIVE   Influenza B by PCR NEGATIVE NEGATIVE   Resp Syncytial Virus by PCR NEGATIVE NEGATIVE  Troponin T, High Sensitivity     Status: Abnormal   Collection Time: 11/15/24  2:09 PM  Result Value Ref Range   Troponin T High Sensitivity 41 (H) 0 - 19 ng/L  I-Stat Lactic Acid, ED     Status: None   Collection Time: 11/15/24  2:34 PM  Result Value Ref Range   Lactic Acid, Venous 1.0 0.5 - 1.9 mmol/L     Radiological Exams on Admission: CT CHEST ABDOMEN PELVIS WO CONTRAST Result Date: 11/15/2024 CLINICAL DATA:  Clinical concern for sepsis. EXAM: CT CHEST, ABDOMEN AND PELVIS WITHOUT CONTRAST TECHNIQUE: Multidetector CT imaging of the chest, abdomen and pelvis was performed following the standard protocol without IV contrast. RADIATION DOSE REDUCTION: This exam was performed according to the  departmental dose-optimization program which includes automated exposure control, adjustment of the mA and/or kV according to  patient size and/or use of iterative reconstruction technique. COMPARISON:  01/27/2024. FINDINGS: CT CHEST FINDINGS Cardiovascular: The heart is normal in size and there is a trace pericardial effusion. Multi-vessel coronary artery calcifications are seen. There is atherosclerotic calcification of the aorta without evidence of aneurysm. The pulmonary trunk is normal in caliber. Mediastinum/Nodes: No mediastinal or axillary lymphadenopathy. Evaluation of the hila is limited due to lack of IV contrast. The thyroid  gland, trachea, and esophagus are within normal limits. Lungs/Pleura: Paraseptal and centrilobular emphysematous changes are present in the lungs. There is a stable cylindrical bronchiectasis is noted in the lower lobes bilaterally with patchy airspace disease at the lung bases, greater on the left than the right. There is a small left pleural effusion. 4 mm nodule in the right middle lobe, axial image 89. Musculoskeletal: Degenerative changes are present in the thoracic spine. No acute fracture is seen. CT ABDOMEN PELVIS FINDINGS Hepatobiliary: There is a 9 mm hypodensity in the left lobe of the liver, segment 2 with attenuation compatible with a cyst. Additional subcentimeter hypodensities are present in the liver which are too small to further characterize. The gallbladder is surgically absent. There is a 4.2 cm cyst in the right lobe of the liver, not significantly changed from the prior exam. No biliary ductal dilatation. Pancreas: Unremarkable. No pancreatic ductal dilatation or surrounding inflammatory changes. Spleen: Normal in size without focal abnormality. Adrenals/Urinary Tract: The adrenal glands are within normal limits. Hypodensities are noted in the kidneys bilaterally, the largest measuring up to 0 7.3 cm on the left, previously 5.5 cm. There is perinephric fat  stranding bilaterally, greater on the left than on the right. No renal calculus or hydronephrosis is seen. 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 Stomach/Bowel: The stomach is within normal limits. No bowel obstruction, free air, or pneumatosis is seen. There is a hernia in the upper abdomen in the midline containing nonobstructed transverse colon. A moderate amount of retained stool is noted in the colon. Appendix appears normal. Vascular/Lymphatic: IVC filter is present. Aortic atherosclerosis. No abdominal or pelvic lymphadenopathy is seen. Reproductive: The prostate gland is not seen and may be surgically absent. Other: A small fat containing umbilical hernia is noted Musculoskeletal: Degenerative changes are present in the thoracolumbar spine. Lumbar spinal fusion hardware is noted from L3-S1. No acute osseous abnormality. IMPRESSION: 1. Bilateral lower lobe bronchiectasis with patchy airspace disease at the lung bases, possible atelectasis or infiltrate. 2. Small left pleural effusion. 3. Slightly complex hypodensity in the upper pole of the left kidney measuring 7.3 cm, increased from 5.5 cm on the previous exam. Perinephric fat stranding bilaterally, greater on the left than on the right with a small amount of free fluid in the perinephric space on the left. Differential diagnosis includes ruptured cyst versus infectious or inflammatory process. 4. Emphysema. 5. Moderate amount of retained stool in the colon. 6. Aortic atherosclerosis and coronary artery calcifications. 7. Remaining incidental findings as described above. Electronically Signed   By: Leita Birmingham M.D.   On: 11/15/2024 14:51   DG Chest Port 1 View Result Date: 11/15/2024 EXAM: 1 VIEW(S) XRAY OF THE CHEST 11/15/2024 12:29:00 PM COMPARISON: 03/01/2024 CLINICAL HISTORY: Questionable sepsis - evaluate for abnormality FINDINGS: LUNGS AND PLEURA: Left base atelectasis. Trace left pleural effusion and/or pleural thickening,  unchanged. No pneumothorax. HEART AND MEDIASTINUM: No acute abnormality of the cardiac and mediastinal silhouettes. BONES AND SOFT TISSUES: No acute osseous abnormality. IMPRESSION: 1. Trace left pleural effusion, unchanged. Electronically signed  by: Morgane Naveau MD 11/15/2024 12:49 PM EST RP Workstation: [id number]   CT CHEST ABDOMEN PELVIS WO CONTRAST  Final Result    DG Chest Port 1 View  Final Result    NM Pulmonary Perfusion    (Results Pending)    Consults called:     EKG: Independently reviewed.  Sinus tach, rate 116  Alm Apo, MD [facility] 11/15/2024, 4:44 PM     [1] No Known Allergies  "

## 2024-11-15 NOTE — Assessment & Plan Note (Signed)
-   No home meds noted - Controlled at this time

## 2024-11-15 NOTE — ED Notes (Signed)
 Patient resting. Would not wake enough to eat dinner per wife.

## 2024-11-15 NOTE — Assessment & Plan Note (Signed)
-   Febrile, tachycardia, tachypnea, leukocytosis, lactic acidosis - Still unclear source but most likely pulmonary given CT chest findings with left greater than right infiltrates but also underlying bronchiectasis noted -Check RVP.  Negative COVID, flu, RSV - check PCT and trend  - Continue vancomycin , cefepime , Flagyl  -Follow-up blood cultures

## 2024-11-15 NOTE — Assessment & Plan Note (Signed)
-   CT angio chest 02/15/2024 showed bilateral lower lobe PE - Now has IVC in place; placed by vascular surgery on 02/22/2024 -Follow-up VQ scan

## 2024-11-15 NOTE — ED Triage Notes (Addendum)
 Pt BIB [facility] from home due to excessive weakness and fatigue the last 24-48 hours.  Pt did slide off bed last night were fire department had to place him back in bed with assistance.  Pt does have suprapubic foley catheter in place.  Baseline 3L nasal cannula. VS BP 130/70, HR 120, Resp 24, Temp 100.3

## 2024-11-15 NOTE — ED Notes (Signed)
 Patient transported to CT

## 2024-11-15 NOTE — Assessment & Plan Note (Signed)
-   Continue Xanax  as needed - Continue Lexapro , Namenda , Zyprexa

## 2024-11-15 NOTE — ED Provider Notes (Signed)
 " Jerry [name] Provider Note   CSN: [id number] Arrival date & time: 11/15/24  1151     Patient presents with: Weakness   Jerry [name]. is a 84 y.o. male.    Weakness Patient presents for generalized weakness.  Medical history includes anemia, asthma, OSA, HTN, CAD, VTE, GI bleed, DM, dementia.  He arrives via EMS from home.  He has reportedly had fatigue and generalized weakness over the past 2 days.  Last night, he had a slide out of bed and EMS had to be called to his home for lift assist, due to his weakness.  EMS noted tachypnea, tachycardia, and low-grade temperature.  Patient describes pain in toes and right heel.  He denies any current other areas of discomfort.  He denies shortness of breath at this time.  He states that he is chronically on 3 L of supplemental oxygen.     Prior to Admission medications  Medication Sig Start Date End Date Taking? Authorizing Provider  albuterol  (VENTOLIN  HFA) 108 (90 Base) MCG/ACT inhaler Inhale 2 puffs into the lungs every 6 (six) hours as needed for wheezing or shortness of breath. 07/12/21   Neysa Rama D, MD  ALPRAZolam  (XANAX ) 0.5 MG tablet Take 0.5 mg by mouth 2 (two) times daily as needed for anxiety.    [provider]  atorvastatin  (LIPITOR) 20 MG tablet Take 1 tablet (20 mg total) by mouth daily. 02/04/24 08/15/24  Christobal Guadalajara, MD  Chlorphen-PE-Acetaminophen  4-10-325 MG TABS Take 1 tablet by mouth daily as needed (For sinus). 02/20/24   [provider]  Cholecalciferol  1000 UNITS tablet Take 1,000 Units by mouth daily.    [provider]  Cyanocobalamin  (VITAMIN B12 PO) Take 1 tablet by mouth daily.    [provider]  DULERA  200-5 MCG/ACT AERO Inhale 2 puffs into the lungs 2 (two) times daily.    [provider]  escitalopram  (LEXAPRO ) 10 MG tablet Take 10 mg by mouth daily.    [provider]  FeFum-FePoly-FA-B Cmp-C-Biot (FOLIVANE-PLUS)  CAPS Take 1 capsule by mouth daily.    [provider]  furosemide  (LASIX ) 40 MG tablet Take 40 mg daily only if needed for swelling or weight gain. 04/24/24   Patwardhan, Newman PARAS, MD  ipratropium-albuterol  (DUONEB) 0.5-2.5 (3) MG/3ML SOLN Inhale 3 mLs into the lungs every 2 (two) hours as needed (SOB/Wheezing).    [provider]  memantine  (NAMENDA ) 5 MG tablet Take 10 mg by mouth 2 (two) times daily. 02/17/24   [provider]  OLANZapine  (ZYPREXA ) 2.5 MG tablet Take 1 tablet (2.5 mg total) by mouth 2 (two) times daily as needed (agitation). 06/05/23   Randol Simmonds, MD  pantoprazole  (PROTONIX ) 40 MG tablet Take 1 tablet (40 mg total) by mouth 2 (two) times daily. Patient not taking: Reported on 08/15/2024 02/18/24   Mdala-Gausi, Masiku Agatha, MD    Allergies: Patient has no known allergies.    Review of Systems  Constitutional:  Positive for fatigue.  Neurological:  Positive for weakness (Generalized).  All other systems reviewed and are negative.   Updated Vital Signs BP (!) 140/89   Pulse (!) 107   Temp (!) 101.4 F (38.6 C) (Rectal)   Resp (!) 23   Ht 5' 11 (1.803 m)   Wt 83.9 kg   SpO2 94%   BMI 25.80 kg/m   Physical Exam Vitals and nursing note reviewed.  Constitutional:      General:  He is not in acute distress.    Appearance: Normal appearance. He is well-developed. He is not toxic-appearing or diaphoretic.  HENT:     Head: Normocephalic and atraumatic.     Right Ear: External ear normal.     Left Ear: External ear normal.     Nose: Nose normal.     Mouth/Throat:     Mouth: Mucous membranes are moist.  Eyes:     Extraocular Movements: Extraocular movements intact.     Conjunctiva/sclera: Conjunctivae normal.  Cardiovascular:     Rate and Rhythm: Regular rhythm. Tachycardia present.     Heart sounds: No murmur heard. Pulmonary:     Effort: Pulmonary effort is normal. Tachypnea present. No respiratory distress.     Breath sounds: Decreased  breath sounds present.  Abdominal:     General: There is no distension.     Palpations: Abdomen is soft.     Tenderness: There is no abdominal tenderness.     Comments: Urostomy bag in place in right lower quadrant.  Musculoskeletal:        General: No swelling or deformity. Normal range of motion.     Cervical back: Normal range of motion and neck supple.     Right lower leg: No edema.     Left lower leg: No edema.  Skin:    General: Skin is warm and dry.     Coloration: Skin is not jaundiced or pale.  Neurological:     General: No focal deficit present.     Mental Status: He is alert and oriented to person, place, and time.  Psychiatric:        Mood and Affect: Mood normal.        Behavior: Behavior normal.     (all labs ordered are listed, but only abnormal results are displayed) Labs Reviewed  COMPREHENSIVE METABOLIC PANEL WITH GFR - Abnormal; Notable for the following components:      Result Value   Potassium 5.4 (*)    Glucose, Bld 111 (*)    BUN 57 (*)    Creatinine, Ser 2.60 (*)    GFR, Estimated 24 (*)    All other components within normal limits  CBC WITH DIFFERENTIAL/PLATELET - Abnormal; Notable for the following components:   WBC 24.0 (*)    Hemoglobin 10.5 (*)    HCT 36.6 (*)    MCV 77.7 (*)    MCH 22.3 (*)    MCHC 28.7 (*)    RDW 16.0 (*)    Neutro Abs 18.8 (*)    Monocytes Absolute 2.8 (*)    Abs Immature Granulocytes 0.48 (*)    All other components within normal limits  PROTIME-INR - Abnormal; Notable for the following components:   Prothrombin Time 16.8 (*)    INR 1.3 (*)    All other components within normal limits  PRO BRAIN NATRIURETIC PEPTIDE - Abnormal; Notable for the following components:   Pro Brain Natriuretic Peptide 663.0 (*)    All other components within normal limits  URINALYSIS, W/ REFLEX TO CULTURE (INFECTION SUSPECTED) - Abnormal; Notable for the following components:   APPearance CLOUDY (*)    pH 9.0 (*)    Hgb urine dipstick  SMALL (*)    Protein, ur 100 (*)    Leukocytes,Ua LARGE (*)    Bacteria, UA RARE (*)    All other components within normal limits  D-DIMER, QUANTITATIVE - Abnormal; Notable for the following components:   D-Dimer, Quant 4.61 (*)  All other components within normal limits  I-STAT CG4 LACTIC ACID, ED - Abnormal; Notable for the following components:   Lactic Acid, Venous 2.0 (*)    All other components within normal limits  I-STAT CHEM 8, ED - Abnormal; Notable for the following components:   Potassium 5.2 (*)    BUN 56 (*)    Creatinine, Ser 2.90 (*)    Glucose, Bld 112 (*)    Hemoglobin 12.2 (*)    HCT 36.0 (*)    All other components within normal limits  I-STAT VENOUS BLOOD GAS, ED - Abnormal; Notable for the following components:   pCO2, Ven 40.8 (*)    pO2, Ven 47 (*)    Potassium 5.2 (*)    HCT 36.0 (*)    Hemoglobin 12.2 (*)    All other components within normal limits  TROPONIN T, HIGH SENSITIVITY - Abnormal; Notable for the following components:   Troponin T High Sensitivity 46 (*)    All other components within normal limits  TROPONIN T, HIGH SENSITIVITY - Abnormal; Notable for the following components:   Troponin T High Sensitivity 41 (*)    All other components within normal limits  CULTURE, BLOOD (ROUTINE X 2)  CULTURE, BLOOD (ROUTINE X 2)  RESP PANEL BY RT-PCR (RSV, FLU A&B, COVID)  RVPGX2  MAGNESIUM   I-STAT CG4 LACTIC ACID, ED    EKG: EKG Interpretation Date/Time:  Saturday November 15 2024 12:01:30 EST Ventricular Rate:  116 PR Interval:  199 QRS Duration:  85 QT Interval:  301 QTC Calculation: 411 R Axis:   60  Text Interpretation: Sinus tachycardia Atrial premature complex Low voltage, extremity and precordial leads Minimal ST elevation, inferior leads Baseline wander in lead(s) V4 Confirmed by Melvenia Motto [id number]) on 11/15/2024 12:07:38 PM  Radiology: CT CHEST ABDOMEN PELVIS WO CONTRAST Result Date: 11/15/2024 CLINICAL DATA:  Clinical concern for  sepsis. EXAM: CT CHEST, ABDOMEN AND PELVIS WITHOUT CONTRAST TECHNIQUE: Multidetector CT imaging of the chest, abdomen and pelvis was performed following the standard protocol without IV contrast. RADIATION DOSE REDUCTION: This exam was performed according to the departmental dose-optimization program which includes automated exposure control, adjustment of the mA and/or kV according to patient size and/or use of iterative reconstruction technique. COMPARISON:  01/27/2024. FINDINGS: CT CHEST FINDINGS Cardiovascular: The heart is normal in size and there is a trace pericardial effusion. Multi-vessel coronary artery calcifications are seen. There is atherosclerotic calcification of the aorta without evidence of aneurysm. The pulmonary trunk is normal in caliber. Mediastinum/Nodes: No mediastinal or axillary lymphadenopathy. Evaluation of the hila is limited due to lack of IV contrast. The thyroid  gland, trachea, and esophagus are within normal limits. Lungs/Pleura: Paraseptal and centrilobular emphysematous changes are present in the lungs. There is a stable cylindrical bronchiectasis is noted in the lower lobes bilaterally with patchy airspace disease at the lung bases, greater on the left than the right. There is a small left pleural effusion. 4 mm nodule in the right middle lobe, axial image 89. Musculoskeletal: Degenerative changes are present in the thoracic spine. No acute fracture is seen. CT ABDOMEN PELVIS FINDINGS Hepatobiliary: There is a 9 mm hypodensity in the left lobe of the liver, segment 2 with attenuation compatible with a cyst. Additional subcentimeter hypodensities are present in the liver which are too small to further characterize. The gallbladder is surgically absent. There is a 4.2 cm cyst in the right lobe of the liver, not significantly changed from the prior exam. No biliary ductal dilatation.  Pancreas: Unremarkable. No pancreatic ductal dilatation or surrounding inflammatory changes. Spleen:  Normal in size without focal abnormality. Adrenals/Urinary Tract: The adrenal glands are within normal limits. Hypodensities are noted in the kidneys bilaterally, the largest measuring up to 0 7.3 cm on the left, previously 5.5 cm. There is perinephric fat stranding bilaterally, greater on the left than on the right. No renal calculus or hydronephrosis is seen. 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 Stomach/Bowel: The stomach is within normal limits. No bowel obstruction, free air, or pneumatosis is seen. There is a hernia in the upper abdomen in the midline containing nonobstructed transverse colon. A moderate amount of retained stool is noted in the colon. Appendix appears normal. Vascular/Lymphatic: IVC filter is present. Aortic atherosclerosis. No abdominal or pelvic lymphadenopathy is seen. Reproductive: The prostate gland is not seen and may be surgically absent. Other: A small fat containing umbilical hernia is noted Musculoskeletal: Degenerative changes are present in the thoracolumbar spine. Lumbar spinal fusion hardware is noted from L3-S1. No acute osseous abnormality. IMPRESSION: 1. Bilateral lower lobe bronchiectasis with patchy airspace disease at the lung bases, possible atelectasis or infiltrate. 2. Small left pleural effusion. 3. Slightly complex hypodensity in the upper pole of the left kidney measuring 7.3 cm, increased from 5.5 cm on the previous exam. Perinephric fat stranding bilaterally, greater on the left than on the right with a small amount of free fluid in the perinephric space on the left. Differential diagnosis includes ruptured cyst versus infectious or inflammatory process. 4. Emphysema. 5. Moderate amount of retained stool in the colon. 6. Aortic atherosclerosis and coronary artery calcifications. 7. Remaining incidental findings as described above. Electronically Signed   By: Leita Birmingham M.D.   On: 11/15/2024 14:51   DG Chest Port 1 View Result Date:  11/15/2024 EXAM: 1 VIEW(S) XRAY OF THE CHEST 11/15/2024 12:29:00 PM COMPARISON: 03/01/2024 CLINICAL HISTORY: Questionable sepsis - evaluate for abnormality FINDINGS: LUNGS AND PLEURA: Left base atelectasis. Trace left pleural effusion and/or pleural thickening, unchanged. No pneumothorax. HEART AND MEDIASTINUM: No acute abnormality of the cardiac and mediastinal silhouettes. BONES AND SOFT TISSUES: No acute osseous abnormality. IMPRESSION: 1. Trace left pleural effusion, unchanged. Electronically signed by: Morgane Naveau MD 11/15/2024 12:49 PM EST RP Workstation: [id number]     Procedures   Medications Ordered in the ED  vancomycin  (VANCOREADY) IVPB 1500 mg/300 mL (1,500 mg Intravenous New Bag/Given 11/15/24 1437)  ipratropium-albuterol  (DUONEB) 0.5-2.5 (3) MG/3ML nebulizer solution 3 mL (3 mLs Nebulization Given 11/15/24 1222)  methylPREDNISolone  sodium succinate (SOLU-MEDROL ) 125 mg/2 mL injection 125 mg (125 mg Intravenous Given 11/15/24 1221)  lactated ringers  bolus 1,000 mL (0 mLs Intravenous Stopped 11/15/24 1341)  ceFEPIme  (MAXIPIME ) 2 g in sodium chloride  0.9 % 100 mL IVPB (0 g Intravenous Stopped 11/15/24 1313)  metroNIDAZOLE  (FLAGYL ) IVPB 500 mg (0 mg Intravenous Stopped 11/15/24 1415)  acetaminophen  (TYLENOL ) tablet 650 mg (650 mg Oral Given 11/15/24 1239)                                    Medical Decision Making Amount and/or Complexity of Data Reviewed Labs: ordered. Radiology: ordered.  Risk OTC drugs. Prescription drug management.   This patient presents to the ED for concern of generalized weakness, this involves an extensive number of treatment options, and is a complaint that carries with it a high risk of complications and morbidity.  The differential diagnosis includes deconditioning,  dehydration, anemia, metabolic derangements, infection, sepsis   Co morbidities / Chronic conditions that complicate the patient evaluation  anemia, asthma, OSA, HTN, CAD, VTE, GI bleed, DM,  dementia   Additional history obtained:  Additional history obtained from EMR External records from outside source obtained and reviewed including EMS   Lab Tests:  I Ordered, and personally interpreted labs.  The pertinent results include: Leukocytosis and lactic acidosis present consistent with sepsis; hemoglobin is baseline; creatinine has increased from baseline.  There is a mild hyperkalemia.   Imaging Studies ordered:  I ordered imaging studies including chest x-ray, CT of chest, abdomen, pelvis I independently visualized and interpreted imaging which showed bibasilar airspace disease consistent with atelectasis and/or infiltrate; perinephric fat stranding bilaterally, greater on the left; moderate amount of retained colonic stool. I agree with the radiologist interpretation   Cardiac Monitoring: / EKG:  The patient was maintained on a cardiac monitor.  I personally viewed and interpreted the cardiac monitored which showed an underlying rhythm of: Sinus rhythm   Problem List / ED Course / Critical interventions / Medication management  Patient presenting for worsening fatigue and generalized weakness over the past 2 days.  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  He was previously on Eliquis  but this was discontinued due to recurrent GI bleeding.  He arrives tachycardic and tachypneic.  EMS also noted low-grade temperature.  On exam, he is able to speak in complete sentences.  He does have diminished breath sounds on lung auscultation.  DuoNeb was ordered.  EKG shows sinus tachycardia without evidence of STEMI.  Workup was initiated.  Patient was found to be febrile in the ED.  Broad-spectrum antibiotics were ordered.  Patient's lab work does show leukocytosis lactic acidosis consistent with sepsis.  On imaging studies, there is evidence of bibasilar pneumonia as well as bilateral perinephric stranding.  However, on analysis of his urostomy urine, findings are not consistent with UTI.  His  creatinine is increased from baseline.  Kidney function not amenable to contrasted study.  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  His D-dimer is elevated.  Will place order for VQ scan.  Patient to be admitted for further management.   I ordered medication including IV fluids and broad-spectrum antibiotics for empiric treatment of sepsis; Tylenol  for antipyresis; DuoNeb and Solu-Medrol  for COPD Reevaluation of the patient after these medicines showed that the patient improved I have reviewed the patients home medicines and have made adjustments as needed  Social Determinants of Health:  Lives at home with family     Final diagnoses:  Sepsis, due to unspecified organism, unspecified whether acute organ dysfunction present [facility])  Pneumonia of both lower lobes due to infectious organism    ED Discharge Orders     None          Melvenia Motto, MD 11/15/24 1510  "

## 2024-11-15 NOTE — Assessment & Plan Note (Signed)
-   From worsened renal function - Continue fluids and repeat BMP in a.m.

## 2024-11-15 NOTE — Hospital Course (Signed)
 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 Cared for at home by wife.  He was brought in due to severe weakness and lethargy.  Symptoms progressed over approximately the past week.  They had a gathering of friends 6 days ago and she states he has gotten progressively weaker and more tired since.  She has not been able to delineate any specific change in physical symptoms.  Urine typically has strong odor which is unchanged and no change in urostomy output otherwise.  Chronic cough which is also unchanged.  No reports of fever.  On workup in the ER he was febrile, 101.4 F, tachycardic in the 110s, tachypneic low 20s. Creatinine slightly worse than usual baseline with elevated BUN as well.  Potassium 5.4. WBC also elevated, 24, 2% bands. proBNP 663 D-dimer 4.61.  Given history of PE despite IVC in place, a VQ scan was ordered in the ER.  Urinalysis noted with large LE, negative nitrite, 0-5 WBC, rare bacteria. Lactic acid 2 initially.  He received vancomycin , cefepime , and Flagyl  in the ER and is admitted for ongoing workup.

## 2024-11-16 LAB — RESPIRATORY PANEL BY PCR

## 2024-11-16 LAB — CBC WITH DIFFERENTIAL/PLATELET
Basophils Absolute: 0 K/uL (ref 0.0–0.1)
Basophils Relative: 0 %
Eosinophils Absolute: 0 K/uL (ref 0.0–0.5)
Eosinophils Relative: 0 %
HCT: 37.2 % — ABNORMAL LOW (ref 39.0–52.0)
Hemoglobin: 10.9 g/dL — ABNORMAL LOW (ref 13.0–17.0)
Lymphocytes Relative: 3 %
Lymphs Abs: 0.8 K/uL (ref 0.7–4.0)
MCH: 22.5 pg — ABNORMAL LOW (ref 26.0–34.0)
MCHC: 29.3 g/dL — ABNORMAL LOW (ref 30.0–36.0)
MCV: 76.9 fL — ABNORMAL LOW (ref 80.0–100.0)
Monocytes Absolute: 0.8 K/uL (ref 0.1–1.0)
Monocytes Relative: 3 %
Neutro Abs: 24.5 K/uL — ABNORMAL HIGH (ref 1.7–7.7)
Neutrophils Relative %: 94 %
Platelets: 229 K/uL (ref 150–400)
RBC: 4.84 MIL/uL (ref 4.22–5.81)
RDW: 16.3 % — ABNORMAL HIGH (ref 11.5–15.5)
WBC: 26.1 K/uL — ABNORMAL HIGH (ref 4.0–10.5)
nRBC: 0 % (ref 0.0–0.2)

## 2024-11-16 LAB — BASIC METABOLIC PANEL WITH GFR
Anion gap: 11 (ref 5–15)
BUN: 55 mg/dL — ABNORMAL HIGH (ref 8–23)
CO2: 23 mmol/L (ref 22–32)
Calcium: 9.6 mg/dL (ref 8.9–10.3)
Chloride: 106 mmol/L (ref 98–111)
Creatinine, Ser: 2.34 mg/dL — ABNORMAL HIGH (ref 0.61–1.24)
GFR, Estimated: 27 mL/min — ABNORMAL LOW
Glucose, Bld: 151 mg/dL — ABNORMAL HIGH (ref 70–99)
Potassium: 5.6 mmol/L — ABNORMAL HIGH (ref 3.5–5.1)
Sodium: 140 mmol/L (ref 135–145)

## 2024-11-16 LAB — PROTIME-INR
INR: 1.4 — ABNORMAL HIGH (ref 0.8–1.2)
Prothrombin Time: 17.9 s — ABNORMAL HIGH (ref 11.4–15.2)

## 2024-11-16 LAB — POTASSIUM: Potassium: 5.3 mmol/L — ABNORMAL HIGH (ref 3.5–5.1)

## 2024-11-16 LAB — CORTISOL-AM, BLOOD: Cortisol - AM: 9 ug/dL (ref 6.7–22.6)

## 2024-11-16 LAB — MRSA NEXT GEN BY PCR, NASAL: MRSA by PCR Next Gen: NOT DETECTED

## 2024-11-16 LAB — MAGNESIUM: Magnesium: 2.2 mg/dL (ref 1.7–2.4)

## 2024-11-16 LAB — PROCALCITONIN: Procalcitonin: 8.63 ng/mL

## 2024-11-16 MED ORDER — VANCOMYCIN HCL 1500 MG/300ML IV SOLN
1500.0000 mg | INTRAVENOUS | Status: DC
  Filled 2024-11-16: qty 300

## 2024-11-16 MED ORDER — SODIUM ZIRCONIUM CYCLOSILICATE 10 G PO PACK
10.0000 g | PACK | Freq: Once | ORAL | Status: AC
  Administered 2024-11-16: 10 g via ORAL
  Filled 2024-11-16: qty 1

## 2024-11-16 MED ORDER — FLUTICASONE FUROATE-VILANTEROL 200-25 MCG/ACT IN AEPB
1.0000 | INHALATION_SPRAY | Freq: Every day | RESPIRATORY_TRACT | Status: DC
  Administered 2024-11-17 – 2024-11-26 (×9): 1 via RESPIRATORY_TRACT
  Filled 2024-11-16: qty 28

## 2024-11-16 NOTE — Progress Notes (Signed)
 " Progress Note   Patient: Jerry [name]. 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 Patient was febrile on arrival to the ED, diagnosed with sepsis and started on IV antibiotics for infection, likely pulmonary source.  Assessment and Plan:  Sepsis -Present on admission, evidenced by lactic acidosis in the setting of infection. - Sepsis is resolving. - Continue antibiotics.  Likely community-acquired pneumonia - CT chest with findings of left greater than right infiltrates as well as bronchiectasis. - RVP was negative for COVID, flu, RSV. - Comprehensive RVP is pending. - Procalcitonin elevated (8.25). - Likely bacterial pneumonia, organism unknown - Continue vancomycin , cefepime , Flagyl  -Follow-up blood cultures - MRSA screen  History of pulmonary embolism - CT angio chest 02/15/2024 showed bilateral lower lobe PE - Now has IVC in place; placed by vascular surgery on 02/22/2024 -Follow-up VQ scan  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) - patient has history of CKD3b. Baseline creat ~ 1.8 - 2, eGFR~ 35 - patient presented with increase in creat >0.3 mg/dL above baseline or creat increase >1.5x baseline presumed to have occurred within past 7 days PTA - creat 2.6 on admission; suspect volume depletion from weakness and poor intake over the past week - Received LR bolus in the ER - Continue on fluids for now.  Hyperkalemia - From worsened renal function - Continue fluids - Lokelma .  Prediabetes - Last A1c 6.2% on 12/27/2023 -Regular diet for now given  poor p.o. intake.  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 Perinephric fat stranding bilaterally left >right with a small amount of free fluid in the perinephric space on the left.  Differentials include ruptured cyst versus infectious or inflammatory process. No renal angle tenderness noted.  No pain. - If white count and fever do not trend down with antibiotics, we will need to pursue this as an infectious source.      Subjective: Patient reports he is feeling much better.  He admits to coughing.  He denies flank, and specifically left flank, pain.  He states he occasionally has burning of his toes and his heels.  He is not diabetic.  Physical Exam: BP 135/86   Pulse 88   Temp 98.8 F (37.1 C) (Oral)   Resp (!) 22   Ht 5' 11 (1.803 m)   Wt 83.9 kg   SpO2 100%   BMI 25.80 kg/m     General: Alert, oriented X3  Eyes: Pupils equal, reactive  Oral cavity: moist mucous membranes  Head: Atraumatic, normocephalic  Neck: supple  Chest: Basal crackles CVS: S1,S2 RRR. No murmurs  Abd: No distention, soft, non-tender. No masses palpable.  No renal angle tenderness Extr: No edema   MSK: No joint deformities or swelling  Neurological: Grossly intact.  Data Reviewed:    Latest Ref Rng & Units 11/16/2024    3:05 AM 11/15/2024   12:40 PM 11/15/2024   12:32 PM  CBC  WBC 4.0 - 10.5 K/uL 26.1     Hemoglobin 13.0 - 17.0 g/dL 89.0  87.7  87.7   Hematocrit 39.0 - 52.0 % 37.2  36.0  36.0   Platelets 150 - 400 K/uL 229         Latest Ref Rng & Units 11/16/2024    3:05 AM 11/15/2024   12:40 PM 11/15/2024   12:32 PM  BMP  Glucose 70 - 99 mg/dL 848   887    BUN 8 - 23 mg/dL 55  56    Creatinine 9.38 - 1.24 mg/dL 7.65  7.09    Sodium 864 - 145 mmol/L 140  139  140   Potassium 3.5 - 5.1 mmol/L 5.6  5.2  5.2   Chloride 98 - 111 mmol/L 106  106    CO2 22 - 32 mmol/L 23     Calcium  8.9 - 10.3 mg/dL 9.6        Family Communication: Spoke with wife at bedside  Disposition: Status is: Inpatient Remains inpatient appropriate because: Being treated for sepsis with IV antibiotics      Author: MDALA-[name], Jerry Trimmer AGATHA, MD 11/16/2024 12:32 PM  For on call review [email or web address].    "

## 2024-11-16 NOTE — Progress Notes (Signed)
" ° °  Brief Progress Note   _____________________________________________________________________________________________________________  Patient Name: Jerry [name]. Patient DOB: Feb 26, 1941 Date: @TODAY @      Data: Reviewed labs, notes, VS.    Action: No action needed at this time.      Response:    _____________________________________________________________________________________________________________  [facility] RN Expeditor Sharolyn JONETTA Batman Please contact us  directly via secure chat (search for [facility]) or by calling us  at [phone] [facility]).  "

## 2024-11-16 NOTE — ED Notes (Signed)
 Pt ate 75% of tray. Pt two juices

## 2024-11-16 NOTE — ED Notes (Signed)
 RN updated spouse on pt care

## 2024-11-16 NOTE — ED Notes (Signed)
 Pt eating breakfast tray

## 2024-11-16 NOTE — Progress Notes (Addendum)
 Pharmacy Antibiotic Note  Jerry D Upton Jr. is a 84 y.o. male for which pharmacy has been consulted for cefepime  and vancomycin  dosing for sepsis.  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 Patient presenting with weakness.  --SCr 2.9 - above baseline >> improved 2.34 closer to baseline --WBC 24>26.1; LA 1; T 101.4>>>98.8; HR 102>92; RR 22 --COVID neg / flu neg  Plan: Cefepime  2g q24hr  Change Vancomycin  to 1500 mg q48hr (eAUC 472.3; Scr 2.34) Monitor WBC, fever, renal function, cultures De-escalate when able  Height: 5' 11 (180.3 cm) Weight: 83.9 kg (185 lb) IBW/kg (Calculated) : 75.3  Temp (24hrs), Avg:98.2 F (36.8 C), Min:97.8 F (36.6 C), Max:98.8 F (37.1 C)  Recent Labs  Lab 11/15/24 1208 11/15/24 1233 11/15/24 1240 11/15/24 1434 11/16/24 0305  WBC 24.0*  --   --   --  26.1*  CREATININE 2.60*  --  2.90*  --  2.34*  LATICACIDVEN  --  2.0*  --  1.0  --     Estimated Creatinine Clearance: 26.4 mL/min (A) (by C-G formula based on SCr of 2.34 mg/dL (H)).    Allergies[1]  Microbiology results: Pending  Thank you for allowing pharmacy to be a part of this patients care.  Dorn Buttner, PharmD, BCPS 11/16/2024 12:22 PM ED Clinical Pharmacist -  [phone]        [1] No Known Allergies

## 2024-11-17 ENCOUNTER — Inpatient Hospital Stay (HOSPITAL_COMMUNITY)

## 2024-11-17 DIAGNOSIS — R6520 Severe sepsis without septic shock: Secondary | ICD-10-CM | POA: Diagnosis not present

## 2024-11-17 DIAGNOSIS — A419 Sepsis, unspecified organism: Secondary | ICD-10-CM | POA: Diagnosis not present

## 2024-11-17 LAB — CBC
HCT: 31.4 % — ABNORMAL LOW (ref 39.0–52.0)
Hemoglobin: 9.3 g/dL — ABNORMAL LOW (ref 13.0–17.0)
MCH: 22.4 pg — ABNORMAL LOW (ref 26.0–34.0)
MCHC: 29.6 g/dL — ABNORMAL LOW (ref 30.0–36.0)
MCV: 75.5 fL — ABNORMAL LOW (ref 80.0–100.0)
Platelets: 230 K/uL (ref 150–400)
RBC: 4.16 MIL/uL — ABNORMAL LOW (ref 4.22–5.81)
RDW: 16 % — ABNORMAL HIGH (ref 11.5–15.5)
WBC: 19.2 K/uL — ABNORMAL HIGH (ref 4.0–10.5)
nRBC: 0 % (ref 0.0–0.2)

## 2024-11-17 LAB — BASIC METABOLIC PANEL WITH GFR
Anion gap: 7 (ref 5–15)
BUN: 57 mg/dL — ABNORMAL HIGH (ref 8–23)
CO2: 23 mmol/L (ref 22–32)
Calcium: 8.5 mg/dL — ABNORMAL LOW (ref 8.9–10.3)
Chloride: 109 mmol/L (ref 98–111)
Creatinine, Ser: 2.04 mg/dL — ABNORMAL HIGH (ref 0.61–1.24)
GFR, Estimated: 32 mL/min — ABNORMAL LOW
Glucose, Bld: 102 mg/dL — ABNORMAL HIGH (ref 70–99)
Potassium: 5 mmol/L (ref 3.5–5.1)
Sodium: 139 mmol/L (ref 135–145)

## 2024-11-17 LAB — C-REACTIVE PROTEIN: CRP: 15.5 mg/dL — ABNORMAL HIGH

## 2024-11-17 MED ORDER — SODIUM CHLORIDE 0.9 % IV SOLN
3.0000 g | Freq: Three times a day (TID) | INTRAVENOUS | Status: DC
  Administered 2024-11-17 – 2024-11-18 (×3): 3 g via INTRAVENOUS
  Filled 2024-11-17 (×3): qty 8

## 2024-11-17 MED ORDER — SODIUM CHLORIDE 0.9 % IV SOLN: INTRAVENOUS | Status: DC

## 2024-11-17 MED ORDER — ULTRAFLORA IMMUNE HEALTH 170 MG PO CAPS: ORAL_CAPSULE | Freq: Every day | ORAL | Status: DC

## 2024-11-17 MED ORDER — HYDRALAZINE HCL 20 MG/ML IJ SOLN: 10.0000 mg | Freq: Four times a day (QID) | INTRAMUSCULAR | Status: DC | PRN

## 2024-11-17 MED ORDER — TECHNETIUM TO 99M ALBUMIN AGGREGATED
4.4000 | Freq: Once | INTRAVENOUS | Status: AC
  Administered 2024-11-17: 4.37 via INTRAVENOUS

## 2024-11-17 MED ORDER — SODIUM ZIRCONIUM CYCLOSILICATE 10 G PO PACK
10.0000 g | PACK | Freq: Once | ORAL | Status: AC
  Administered 2024-11-17: 10 g via ORAL
  Filled 2024-11-17: qty 1

## 2024-11-17 MED ORDER — FUROSEMIDE 10 MG/ML IJ SOLN
20.0000 mg | Freq: Once | INTRAMUSCULAR | Status: AC
  Administered 2024-11-17: 20 mg via INTRAVENOUS
  Filled 2024-11-17: qty 2

## 2024-11-17 MED ORDER — SODIUM CHLORIDE 0.9 % IV SOLN
500.0000 mg | INTRAVENOUS | Status: DC
  Administered 2024-11-17 – 2024-11-18 (×2): 500 mg via INTRAVENOUS
  Filled 2024-11-17 (×2): qty 5

## 2024-11-17 MED ORDER — LEVALBUTEROL HCL 0.63 MG/3ML IN NEBU
0.6300 mg | INHALATION_SOLUTION | Freq: Four times a day (QID) | RESPIRATORY_TRACT | Status: DC | PRN
  Administered 2024-11-17 – 2024-11-18 (×2): 0.63 mg via RESPIRATORY_TRACT
  Filled 2024-11-17 (×3): qty 3

## 2024-11-17 MED ORDER — AMLODIPINE BESYLATE 10 MG PO TABS
10.0000 mg | ORAL_TABLET | Freq: Every day | ORAL | Status: DC
  Administered 2024-11-17 – 2024-11-26 (×10): 10 mg via ORAL
  Filled 2024-11-17 (×10): qty 1

## 2024-11-17 MED ORDER — ALBUTEROL SULFATE (2.5 MG/3ML) 0.083% IN NEBU
INHALATION_SOLUTION | RESPIRATORY_TRACT | Status: AC
  Administered 2024-11-17: 2.5 mg
  Filled 2024-11-17: qty 3

## 2024-11-17 MED ORDER — SODIUM CHLORIDE 0.9 % IV SOLN
3.0000 g | Freq: Four times a day (QID) | INTRAVENOUS | Status: DC
  Administered 2024-11-17: 3 g via INTRAVENOUS
  Filled 2024-11-17: qty 8

## 2024-11-17 NOTE — Progress Notes (Signed)
 " Progress Note   Patient: Jerry [name]. 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  Patient was febrile on arrival to the ED, diagnosed with sepsis and started on IV antibiotics for infection, likely pulmonary source.   Subjective: Patient in bed, appears comfortable, denies any headache, no fever, no chest pain or pressure, no shortness of breath , no abdominal pain. No new focal weakness.   Assessment and Plan:  Sepsis due to community-acquired pneumonia  - on appropriate antibiotics, blood cultures negative thus far, MRSA nasal screen negative, sepsis pathophysiology has resolved, has been adequately hydrated, currently symptom-free with minimal cough but no shortness of breath, taper down antibiotics total 7 days treatment and follow cultures.  Respiratory viral panel negative.  Encouraged to sit in chair use I-S and flutter valve in the daytime, advance activity and titrate down oxygen.  Also have speech therapy evaluate him.  History of pulmonary embolism  - CT angio chest 02/15/2024 showed bilateral lower lobe PE, has IVC filter.  VQ scan ordered upon admission will be followed up.  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) - patient has history of CKD3b. Baseline creat rose to 2, AKI due to sepsis improved after hydration.  Prediabetes - Last A1c 6.2% on 12/27/2023, Regular diet for now given poor p.o. intake.  PCP to monitor.  Vascular dementia (HCC) - Continue Xanax  as needed Lexapro , Namenda , Zyprexa , at risk for delirium minimize benzodiazepines  and narcotics.  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 CT scan done in the ER shows possible ruptured left renal cyst, no symptoms, we will repeat a renal ultrasound thereafter follow-up with PCP and urology outpatient.      Physical Exam: BP (!) 156/87 (BP Location: Left Arm)   Pulse (!) 113   Temp 99.3 F (37.4 C) (Oral)   Resp (!) 24   Ht 5' 11 (1.803 m)   Wt 83.9 kg   SpO2 100%   BMI 25.80 kg/m     Awake Alert, No new F.N deficits, Normal affect .AT,PERRAL Supple Neck, No JVD,   Symmetrical Chest wall movement, Good air movement bilaterally,+ve rales RRR,No Gallops, Rubs or new Murmurs,  +ve B.Sounds, Abd Soft, No tenderness,   No Cyanosis, Clubbing or edema     Data Review:   Patient Lines/Drains/Airways Status     Active Line/Drains/Airways     Name Placement date Placement time Site Days   Peripheral IV 11/15/24 20 G Anterior;Distal;Right Forearm 11/15/24  1221  Forearm  2   Urostomy Other (Comment) RLQ --  --  RLQ  --             Inpatient Medications  Scheduled Meds:  enoxaparin  (LOVENOX ) injection  30 mg Subcutaneous Q24H   escitalopram   10 mg Oral Daily   fluticasone  furoate-vilanterol  1 puff Inhalation Daily   furosemide   20 mg Intravenous Once   memantine   10 mg Oral BID   sodium chloride  flush  3 mL Intravenous Q12H   Continuous Infusions:  ampicillin -sulbactam (UNASYN ) IV     azithromycin  500 mg (11/17/24 0544)   PRN Meds:.acetaminophen  **OR** acetaminophen , ALPRAZolam , OLANZapine , ondansetron  **OR** ondansetron  (ZOFRAN ) IV  DVT Prophylaxis  enoxaparin  (LOVENOX ) injection 30 mg Start: 11/16/24 1000   Recent Labs  Lab 11/15/24 1208 11/15/24 1232 11/15/24 1240 11/16/24 0305 11/17/24 0314  WBC 24.0*  --   --  26.1* 19.2*  HGB 10.5* 12.2*  12.2* 10.9* 9.3*  HCT 36.6* 36.0* 36.0* 37.2* 31.4*  PLT 235  --   --  229 230  MCV 77.7*  --   --  76.9* 75.5*  MCH 22.3*  --   --  22.5* 22.4*  MCHC 28.7*  --   --  29.3* 29.6*  RDW 16.0*  --   --  16.3* 16.0*  LYMPHSABS 1.8  --   --  0.8  --   MONOABS 2.8*  --   --  0.8  --   EOSABS 0.0  --   --  0.0  --   BASOSABS 0.1  --   --  0.0  --     Recent Labs  Lab 11/15/24 1208 11/15/24 1232 11/15/24 1233 11/15/24 1240 11/15/24 1434 11/15/24 1648 11/16/24 0305 11/16/24 1647 11/17/24 0314 11/17/24 0715  NA 138 140  --  139  --   --  140  --  139  --   K 5.4* 5.2*  --  5.2*  --   --  5.6* 5.3* 5.0  --   CL 104  --   --  106  --   --  106  --  109  --   CO2 23  --   --   --   --   --  23  --  23  --   ANIONGAP 11  --   --   --   --   --  11  --  7  --   GLUCOSE 111*  --   --  112*  --   --  151*  --  102*  --   BUN 57*  --   --  56*  --   --  55*  --  57*  --   CREATININE 2.60*  --   --  2.90*  --   --  2.34*  --  2.04*  --   AST 17  --   --   --   --   --   --   --   --   --   ALT 10  --   --   --   --   --   --   --   --   --   ALKPHOS 85  --   --   --   --   --   --   --   --   --   BILITOT 0.6  --   --   --   --   --   --   --   --   --   ALBUMIN  4.1  --   --   --   --   --   --   --   --   --   CRP  --   --   --   --   --   --   --   --   --  15.5*  DDIMER 4.61*  --   --   --   --   --   --   --   --   --   PROCALCITON  --   --   --   --   --  8.25 8.63  --   --   --   LATICACIDVEN  --   --  2.0*  --  1.0  --   --   --   --   --   INR 1.3*  --   --   --   --   --  1.4*  --   --   --   MG 2.0  --   --   --   --   --  2.2  --   --   --   CALCIUM  9.7  --   --   --   --   --  9.6  --  8.5*  --       Recent Labs  Lab 11/15/24 1208 11/15/24 1233 11/15/24 1434 11/15/24 1648 11/16/24 0305 11/17/24 0314 11/17/24 0715  CRP  --   --   --   --   --   --  15.5*  DDIMER 4.61*  --   --   --   --   --   --   PROCALCITON  --   --   --  8.25 8.63  --   --   LATICACIDVEN  --   2.0* 1.0  --   --   --   --   INR 1.3*  --   --   --  1.4*  --   --   MG 2.0  --   --   --  2.2  --   --   CALCIUM  9.7  --   --   --  9.6 8.5*  --     --------------------------------------------------------------------------------------------------------------- Lab Results  Component Value Date   CHOL 119 01/29/2024   HDL 61 01/29/2024   LDLCALC 39 01/29/2024   TRIG 94 01/29/2024   CHOLHDL 2.0 01/29/2024    Lab Results  Component Value Date   HGBA1C 6.2 (H) 12/27/2023   No results for input(s): TSH, T4TOTAL, FREET4, T3FREE, THYROIDAB in the last 72 hours. No results for input(s): VITAMINB12, FOLATE, FERRITIN, TIBC, IRON , RETICCTPCT in the last 72 hours. ------------------------------------------------------------------------------------------------------------------ Cardiac Enzymes No results for input(s): CKMB, TROPONINI, MYOGLOBIN in the last 168 hours.  Invalid input(s): CK  Micro Results Recent Results (from the past 240 hours)  Blood Culture (routine x 2)     Status: None (Preliminary result)   Collection Time: 11/15/24 12:08 PM   Specimen: BLOOD RIGHT HAND  Result Value Ref Range Status   Specimen Description BLOOD RIGHT HAND  Final   Special Requests   Final    BOTTLES [place]WN AEROBIC AND ANAEROBIC Blood Culture results may not be optimal due to an inadequate volume of blood received in culture bottles   Culture   Final    NO GROWTH 2 DAYS Performed at [facility], [address] [place], [place] [address]    Report Status PENDING  Incomplete  Blood Culture (routine x 2)     Status: None (Preliminary result)   Collection Time: 11/15/24 12:13 PM   Specimen: BLOOD LEFT HAND  Result Value Ref Range Status   Specimen Description BLOOD LEFT HAND  Final   Special Requests   Final    BOTTLES  DRAWN AEROBIC AND ANAEROBIC Blood Culture adequate volume   Culture   Final    NO GROWTH 2 DAYS Performed at [facility], [address] [place], [place] [address]    Report Status PENDING  Incomplete  Resp panel by RT-PCR (RSV, Flu A&B, Covid) Anterior Nasal Swab     Status: None   Collection Time: 11/15/24 12:46 PM   Specimen: Anterior Nasal Swab  Result Value Ref Range Status   SARS Coronavirus 2 by RT PCR NEGATIVE NEGATIVE Final   Influenza A by PCR NEGATIVE NEGATIVE Final   Influenza B by PCR NEGATIVE NEGATIVE Final    Comment: (NOTE) The Xpert Xpress SARS-CoV-2/FLU/RSV plus assay is intended as an aid in the diagnosis of influenza from Nasopharyngeal swab specimens and should not be used as a sole basis for treatment. Nasal washings and aspirates are unacceptable for Xpert Xpress SARS-CoV-2/FLU/RSV testing.  Fact Sheet for Patients: bloggercourse.com  Fact Sheet for Healthcare Providers: seriousbroker.it  This test is not yet approved or cleared by the [place]  FDA and has been authorized for detection and/or diagnosis of SARS-CoV-2 by FDA under an Emergency Use Authorization (EUA). This EUA will remain in effect (meaning this test can be used) for the duration of the COVID-19 declaration under Section 564(b)(1) of the Act, 21 U.S.C. section 360bbb-3(b)(1), unless the authorization is terminated or revoked.     Resp Syncytial Virus by PCR NEGATIVE NEGATIVE Final    Comment: (NOTE) Fact Sheet for Patients: bloggercourse.com  Fact Sheet for Healthcare Providers: seriousbroker.it  This test is not yet approved or cleared by the [place]  FDA and has been authorized for detection and/or diagnosis of SARS-CoV-2 by FDA under an Emergency Use Authorization (EUA). This EUA will remain in effect (meaning this test can be used) for the duration of the COVID-19 declaration under Section 564(b)(1) of the Act, 21 U.S.C. section 360bbb-3(b)(1), unless the authorization is terminated  or revoked.  Performed at [facility], [address] [place], [place] [address]   Respiratory (~20 pathogens) panel by PCR     Status: None   Collection Time: 11/15/24 12:46 PM   Specimen: Nasopharyngeal Swab; Respiratory  Result Value Ref Range Status   Adenovirus NOT DETECTED NOT DETECTED Final   Coronavirus 229E NOT DETECTED NOT DETECTED Final    Comment: (NOTE) The Coronavirus on the Respiratory Panel, DOES NOT test for the novel  Coronavirus (2019 nCoV)    Coronavirus HKU1 NOT DETECTED NOT DETECTED Final   Coronavirus NL63 NOT DETECTED NOT DETECTED Final   Coronavirus OC43 NOT DETECTED NOT DETECTED Final   Metapneumovirus NOT DETECTED NOT DETECTED Final   Rhinovirus / Enterovirus NOT DETECTED NOT DETECTED Final   Influenza A NOT DETECTED NOT DETECTED Final   Influenza B NOT DETECTED NOT DETECTED Final   Parainfluenza Virus 1 NOT DETECTED NOT DETECTED Final   Parainfluenza Virus 2 NOT DETECTED NOT DETECTED Final   Parainfluenza Virus 3 NOT DETECTED NOT DETECTED Final   Parainfluenza Virus 4 NOT DETECTED NOT DETECTED Final   Respiratory Syncytial Virus NOT DETECTED NOT DETECTED Final   Bordetella pertussis NOT DETECTED NOT DETECTED Final   Bordetella Parapertussis NOT DETECTED NOT DETECTED Final   Chlamydophila pneumoniae NOT DETECTED NOT DETECTED Final   Mycoplasma pneumoniae NOT DETECTED NOT DETECTED Final    Comment: Performed at [facility], [address] [place], [place] [address]  MRSA Next Gen by PCR, Nasal     Status: None  Collection Time: 11/16/24 12:40 PM   Specimen: Nasal Mucosa; Nasal Swab  Result Value Ref Range Status   MRSA by PCR Next Gen NOT DETECTED NOT DETECTED Final    Comment: (NOTE) The GeneXpert MRSA Assay (FDA approved for NASAL specimens only), is one component of a comprehensive MRSA colonization surveillance program. It is not intended to diagnose MRSA infection nor to guide or monitor treatment for MRSA infections. Test  performance is not FDA approved in patients less than 84 years old. Performed at [facility], [address] [place], [place] [address]     Radiology Reports  DG Chest Port 1 View Result Date: 11/17/2024 EXAM: 1 VIEW(S) XRAY OF THE CHEST 11/17/2024 06:28:00 AM COMPARISON: Chest CT without contrast 11/15/2024. CLINICAL HISTORY: SOB (shortness of breath). FINDINGS: LUNGS AND PLEURA: Left pleural effusion is increased. Infrahilar bronchiectasis in the lower lobes was better seen with CT. The lungs are mildly emphysematous and again showing scattered airspace disease in the left lung base. No new or worsening infiltrate is evident. No pneumothorax. HEART AND MEDIASTINUM: No acute abnormality of the cardiac and mediastinal silhouettes. BONES AND SOFT TISSUES: Degenerative change and mild dextroscoliosis of the thoracic spine. IMPRESSION: 1. Increased small  left pleural effusion. 2. Mild emphysema with scattered left basilar airspace disease, without new or worsening infiltrate. 3. Infrahilar bilateral cylindrical lower lobe bronchiectasis, better seen on CT. Electronically signed by: Francis Quam MD 11/17/2024 06:49 AM EST RP Workstation: [id number]   CT CHEST ABDOMEN PELVIS WO CONTRAST Result Date: 11/15/2024 CLINICAL DATA:  Clinical concern for sepsis. EXAM: CT CHEST, ABDOMEN AND PELVIS WITHOUT CONTRAST TECHNIQUE: Multidetector CT imaging of the chest, abdomen and pelvis was performed following the standard protocol without IV contrast. RADIATION DOSE REDUCTION: This exam was performed according to the departmental dose-optimization program which includes automated exposure control, adjustment of the mA and/or kV according to patient size and/or use of iterative reconstruction technique. COMPARISON:  01/27/2024. FINDINGS: CT CHEST FINDINGS Cardiovascular: The heart is normal in size and there is a trace pericardial effusion. Multi-vessel coronary artery calcifications are seen. There is  atherosclerotic calcification of the aorta without evidence of aneurysm. The pulmonary trunk is normal in caliber. Mediastinum/Nodes: No mediastinal or axillary lymphadenopathy. Evaluation of the hila is limited due to lack of IV contrast. The thyroid  gland, trachea, and esophagus are within normal limits. Lungs/Pleura: Paraseptal and centrilobular emphysematous changes are present in the lungs. There is a stable cylindrical bronchiectasis is noted in the lower lobes bilaterally with patchy airspace disease at the lung bases, greater on the left than the right. There is a small left pleural effusion. 4 mm nodule in the right middle lobe, axial image 89. Musculoskeletal: Degenerative changes are present in the thoracic spine. No acute fracture is seen. CT ABDOMEN PELVIS FINDINGS Hepatobiliary: There is a 9 mm hypodensity in the left lobe of the liver, segment 2 with attenuation compatible with a cyst. Additional subcentimeter hypodensities are present in the liver which are too small to further characterize. The gallbladder is surgically absent. There is a 4.2 cm cyst in the right lobe of the liver, not significantly changed from the prior exam. No biliary ductal dilatation. Pancreas: Unremarkable. No pancreatic ductal dilatation or surrounding inflammatory changes. Spleen: Normal in size without focal abnormality. Adrenals/Urinary Tract: The adrenal glands are within normal limits. Hypodensities are noted in the kidneys bilaterally, the largest measuring up to 0 7.3 cm on the left, previously 5.5 cm. There is perinephric fat stranding bilaterally, greater  on the left than on the right. No renal calculus or hydronephrosis is seen. 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 Stomach/Bowel: The stomach is within normal limits. No bowel obstruction, free air, or pneumatosis is seen. There is a hernia in the upper abdomen in the midline containing nonobstructed transverse colon. A moderate amount of  retained stool is noted in the colon. Appendix appears normal. Vascular/Lymphatic: IVC filter is present. Aortic atherosclerosis. No abdominal or pelvic lymphadenopathy is seen. Reproductive: The prostate gland is not seen and may be surgically absent. Other: A small fat containing umbilical hernia is noted Musculoskeletal: Degenerative changes are present in the thoracolumbar spine. Lumbar spinal fusion hardware is noted from L3-S1. No acute osseous abnormality. IMPRESSION: 1. Bilateral lower lobe bronchiectasis with patchy airspace disease at the lung bases, possible atelectasis or infiltrate. 2. Small left pleural effusion. 3. Slightly complex hypodensity in the upper pole of the left kidney measuring 7.3 cm, increased from 5.5 cm on the previous exam. Perinephric fat stranding bilaterally, greater on the left than on the right with a small amount of free fluid in the perinephric space on the left. Differential diagnosis includes ruptured cyst versus infectious or inflammatory process. 4. Emphysema. 5. Moderate amount of retained stool in the colon. 6. Aortic atherosclerosis and coronary artery calcifications. 7. Remaining incidental findings as described above. Electronically Signed   By: Leita Birmingham M.D.   On: 11/15/2024 14:51   DG Chest Port 1 View Result Date: 11/15/2024 EXAM: 1 VIEW(S) XRAY OF THE CHEST 11/15/2024 12:29:00 PM COMPARISON: 03/01/2024 CLINICAL HISTORY: Questionable sepsis - evaluate for abnormality FINDINGS: LUNGS AND PLEURA: Left base atelectasis. Trace left pleural effusion and/or pleural thickening, unchanged. No pneumothorax. HEART AND MEDIASTINUM: No acute abnormality of the cardiac and mediastinal silhouettes. BONES AND SOFT TISSUES: No acute osseous abnormality. IMPRESSION: 1. Trace left pleural effusion, unchanged. Electronically signed by: Morgane Naveau MD 11/15/2024 12:49 PM EST RP Workstation: [id number]      Signature  -   Lavada Stank M.D on 11/17/2024 at 8:36 AM   -  To  page go to [email or web address]        "

## 2024-11-17 NOTE — Plan of Care (Signed)

## 2024-11-17 NOTE — Evaluation (Signed)
 Physical Therapy Evaluation Patient Details Name: Jerry [name]. MRN: [id number] DOB: 1941-06-05 Today's Date: 11/17/2024  History of Present Illness  Mr. Dethloff is an 84 yo presenting with weakness and lethargy. Admitted with severe sepsis likely from PNA with CT Chest showing L > R infiltrates and bronchiectasis. Previous swallow eval in March 2025 with normal appearing swallow. PMH includes: parkinsonism, dementia, prostate ca, CKDIII, HTN, HLD  Clinical Impression  Pt admitted with above diagnosis. Pt was able to come to EOB with mod assist and transfer with mod assist to chair.  Pt with poor balance reactions and incr need for external support as well as RW support. Used cane vs. RW PTA and was more independent per pt than current state.  Has wife that can assist per pt. If wife can assist 24 hours day at min to mod assist level, can go home with HHPT. IF she cannot, recommend post acute rehab < 3 hours day.  fWill follow acutely.  Pt currently with functional limitations due to the deficits listed below (see PT Problem List). Pt will benefit from acute skilled PT to increase their independence and safety with mobility to allow discharge.           If plan is discharge home, recommend the following: A little help with walking and/or transfers;A little help with bathing/dressing/bathroom;Assistance with cooking/housework;Assist for transportation;Help with stairs or ramp for entrance   Can travel by private vehicle        Equipment Recommendations Wheelchair (measurements PT);Wheelchair cushion (measurements PT), may need wheelchair  Recommendations for Other Services       Functional Status Assessment Patient has had a recent decline in their functional status and demonstrates the ability to make significant improvements in function in a reasonable and predictable amount of time.     Precautions / Restrictions Precautions Precautions: Fall Restrictions Weight Bearing Restrictions Per  Provider Order: No      Mobility  Bed Mobility Overal bed mobility: Needs Assistance Bed Mobility: Supine to Sit     Supine to sit: Mod assist     General bed mobility comments: Needed mod assist to move LES off bed and for elevation of trunk.    Transfers Overall transfer level: Needs assistance Equipment used: Rolling walker (2 wheels) Transfers: Sit to/from Stand, Bed to chair/wheelchair/BSC Sit to Stand: Mod assist, From elevated surface   Step pivot transfers: Mod assist, From elevated surface       General transfer comment: Pt required mod assist to power up and mod assist to pivot to chair. Pt with flexed posture and couldnt stand upright even with assist and cues. Pt leaning posteriorly and needed guidance and alot of assist to take pivotal steps with use of RW to chair.  Needed assist to control descent into chair.    Ambulation/Gait                  Stairs            Wheelchair Mobility     Tilt Bed    Modified Rankin (Stroke Patients Only)       Balance Overall balance assessment: Needs assistance Sitting-balance support: No upper extremity supported, Feet supported Sitting balance-Leahy Scale: Fair     Standing balance support: Bilateral upper extremity supported, During functional activity, Reliant on assistive device for balance Standing balance-Leahy Scale: Poor Standing balance comment: relies on UE support  Pertinent Vitals/Pain Pain Assessment Pain Assessment: No/denies pain    Home Living Family/patient expects to be discharged to:: Private residence Living Arrangements: Spouse/significant other Available Help at Discharge: Available 24 hours/day Type of Home: House Home Access: Stairs to enter Entrance Stairs-Rails: Right Entrance Stairs-Number of Steps: 3   Home Layout: One level Home Equipment: Grab bars - toilet;Shower seat;Grab bars - [employer] (2  wheels);Cane - single point Additional Comments: recently with HHOT, PT and RN    Prior Function Prior Level of Function : Independent/Modified Independent             Mobility Comments: reports using cane vs RW if going outside of the home ADLs Comments: Indep with ADLs, wife assists with IADLs     Extremity/Trunk Assessment   Upper Extremity Assessment Upper Extremity Assessment: Defer to OT evaluation    Lower Extremity Assessment Lower Extremity Assessment: Generalized weakness    Cervical / Trunk Assessment Cervical / Trunk Assessment: Kyphotic  Communication   Communication Communication: No apparent difficulties    Cognition Arousal: Alert Behavior During Therapy: WFL for tasks assessed/performed   PT - Cognitive impairments: No apparent impairments                         Following commands: Impaired Following commands impaired: Follows one step commands with increased time     Cueing Cueing Techniques: Verbal cues, Tactile cues     General Comments General comments (skin integrity, edema, etc.): Pt was not on O2 on arrival. Appeared that whomever brought pt back from procedure didnt replace once pt back to room.  Pt reports he is on 3LO2 at home per pt; 112 bpm, 100 % RA, 147/79; BP 111/69 end of rx and did replace at 3LO2 with sats 100% prior to leaving room.    Exercises General Exercises - Lower Extremity Ankle Circles/Pumps: AROM, Both, 10 reps, Seated Long Arc Quad: AROM, Both, 10 reps, Seated   Assessment/Plan    PT Assessment Patient needs continued PT services  PT Problem List Decreased activity tolerance;Decreased balance;Decreased mobility;Decreased knowledge of use of DME;Decreased safety awareness;Decreased knowledge of precautions;Cardiopulmonary status limiting activity       PT Treatment Interventions DME instruction;Gait training;Functional mobility training;Therapeutic activities;Therapeutic exercise;Balance  training;Patient/family education    PT Goals (Current goals can be found in the Care Plan section)  Acute Rehab PT Goals Patient Stated Goal: to go home PT Goal Formulation: With patient Time For Goal Achievement: 12/01/24 Potential to Achieve Goals: Fair    Frequency Min 2X/week     Co-evaluation               AM-PAC PT 6 Clicks Mobility  Outcome Measure Help needed turning from your back to your side while in a flat bed without using bedrails?: A Lot Help needed moving from lying on your back to sitting on the side of a flat bed without using bedrails?: A Lot Help needed moving to and from a bed to a chair (including a wheelchair)?: Total Help needed standing up from a chair using your arms (e.g., wheelchair or bedside chair)?: A Lot Help needed to walk in hospital room?: Total Help needed climbing 3-5 steps with a railing? : Total 6 Click Score: 9    End of Session Equipment Utilized During Treatment: Gait belt;Oxygen Activity Tolerance: Patient limited by fatigue Patient left: in chair;with call bell/phone within reach;with chair alarm set Nurse Communication: Mobility status;Need for lift equipment (stedy) PT Visit  Diagnosis: Unsteadiness on feet (R26.81);Muscle weakness (generalized) (M62.81)    Time: 8854-8791 PT Time Calculation (min) (ACUTE ONLY): 23 min   Charges:   PT Evaluation $PT Eval Moderate Complexity: 1 Mod PT Treatments $Therapeutic Activity: 8-22 mins PT General Charges $$ ACUTE PT VISIT: 1 Visit         Ludia Gartland M,PT Acute Rehab Services [phone]   Stephane JULIANNA Bevel 11/17/2024, 2:29 PM

## 2024-11-17 NOTE — Plan of Care (Signed)
" °  Problem: Clinical Measurements: Goal: Diagnostic test results will improve Outcome: Progressing   Problem: Respiratory: Goal: Ability to maintain adequate ventilation will improve Outcome: Progressing   Problem: Clinical Measurements: Goal: Diagnostic test results will improve Outcome: Progressing Goal: Respiratory complications will improve Outcome: Progressing Goal: Cardiovascular complication will be avoided Outcome: Progressing   "

## 2024-11-17 NOTE — Progress Notes (Addendum)
 OT Cancellation Note  Patient Details Name: Jerry [name]. MRN: [id number] DOB: [birth year]   Cancelled Treatment:    Reason Eval/Treat Not Completed: Patient at procedure or test/ unavailable.  Ultrasound in room, OT will return to execute evaluation.    Jackee Glasner D Bud Kaeser 11/17/2024, 2:26 PM 11/17/2024  RP, OTR/L  Acute Rehabilitation Services  Office:  [phone]

## 2024-11-17 NOTE — Consult Note (Addendum)
 WOC Nurse ostomy consult note; patient with ileal conduit since 2022 post prostate CA  Patient has been followed by K. Mayo NP at [facility], last seen 01/15/2024 and requires convexity and an ostomy belt to prevent leaks  Stoma type/location: RLQ ileal conduit  Stomal assessment/size:  when last assessed 1 pink and budded  Peristomal assessment:  Treatment options for stomal/peristomal skin: 2 barrier ring, no sting barrier wipes and stoma powder Soila #6)  Output  Ostomy pouching: 1pc. Convex urostomy pouch (Lawson# [phone])  with 2 barrier ring placed directly around stoma Soila [phone])  Education provided: none, patient has been educated on ostomy care Enrolled patient in [employer] DC program: previously   Bedside RN should remove current pouch, clean skin around stoma with water moistened washcloth.  If skin is broken down/denuded apply a small amount of stoma powder Soila #6) then crust with no sting barrier wipe (blue and white packet).  After crusting skin stretch and place a 2 barrier ring directly around stoma. Cut new skin barrier to match size of stoma and place on top of the 2 barrier ring.  Would  recommend use of a ostomy belt Soila [phone]) to help prevent leaks.  Hooking urostomy pouch to bedside drainage bag will also help prevent leaks.  Will need urostomy adapter which is Gerlean [phone].  Secure chat sent to primary nurse to determine if any leaks/needs to be seen in person. Will follow.  Thank you,    Powell Bar MSN, RN-BC, [employer]

## 2024-11-17 NOTE — Progress Notes (Signed)
 PT Cancellation Note  Patient Details Name: Jerry [name]. MRN: [id number] DOB: 1941/06/06   Cancelled Treatment:    Reason Eval/Treat Not Completed: Patient at procedure or test/unavailable (Pt in Nuc Med. Will return as able.)   Stephane JULIANNA Bevel 11/17/2024, 9:43 AM Anand Tejada M,PT Acute Rehab Services [phone]

## 2024-11-17 NOTE — Evaluation (Signed)
 Clinical/Bedside Swallow Evaluation Patient Details  Name: Jerry [name]. MRN: [id number] Date of Birth: April 28, 1941  Today's Date: 11/17/2024 Time: SLP Start Time (ACUTE ONLY): 1052 SLP Stop Time (ACUTE ONLY): 1107 SLP Time Calculation (min) (ACUTE ONLY): 15 min  Past Medical History:  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   Malnutrition of moderate degree 06/28/2023   Mixed hyperlipidemia 07/21/2021   OSA (obstructive sleep apnea) 07/12/2021   NPSG- 03/16/21- AHI 10.3/ hr, desaturation to 87%, body weight 190 lbs     Renal insufficiency 04/17/2015   Syncope 04/17/2015   Tremor 07/21/2021   Vascular dementia (HCC)    Vitamin D  deficiency 07/21/2021   Past Surgical History:  Past Surgical History:  Procedure Laterality Date   BACK SURGERY     BIOPSY  10/09/2023   Procedure: BIOPSY;  Surgeon: Jerry Claw, MD;  Location: [facility]COPY;  Service: Gastroenterology;;   CHOLECYSTECTOMY     COLONOSCOPY WITH PROPOFOL  Bilateral 10/09/2023   Procedure: COLONOSCOPY WITH PROPOFOL ;  Surgeon: Jerry Claw, MD;  Location: [facility];  Service: Gastroenterology;  Laterality: Bilateral;   ESOPHAGOGASTRODUODENOSCOPY N/A 02/22/2024   Procedure: EGD (ESOPHAGOGASTRODUODENOSCOPY);  Surgeon: Jerry Claw, MD;  Location[facility]COPY;  Service: Gastroenterology;  Laterality: N/A;   ESOPHAGOGASTRODUODENOSCOPY (EGD) WITH PROPOFOL  Bilateral 10/09/2023   Procedure: ESOPHAGOGASTRODUODENOSCOPY (EGD) WITH PROPOFOL ;  Surgeon:  Jerry Claw, MD;  Location: [facility];  Service: Gastroenterology;  Laterality: Bilateral;   HOT HEMOSTASIS N/A 10/09/2023   Procedure: HOT HEMOSTASIS (ARGON PLASMA COAGULATION/BICAP);  Surgeon: Jerry Claw, MD;  Location: [facility];  Service: Gastroenterology;  Laterality: N/A;   KNEE SURGERY Left    LOWER EXTREMITY VENOGRAPHY  01/29/2024   Procedure: LOWER EXTREMITY VENOGRAPHY;  Surgeon: Jordan, Peter M, MD;  Location: [facility] INVASIVE CV LAB;  Service: Cardiovascular;;   RIGHT/LEFT HEART CATH AND CORONARY ANGIOGRAPHY N/A 01/29/2024   Procedure: RIGHT/LEFT HEART CATH AND CORONARY ANGIOGRAPHY;  Surgeon: Jordan, Peter M, MD;  Location: [facility] INVASIVE CV LAB;  Service: Cardiovascular;  Laterality: N/A;   SHOULDER SURGERY     for rotator cuff tear   VENA CAVA UMBRELLA NECK APPROACH Right 02/22/2024   Procedure: INSERTION, UMBRELLA FILTER, INFERIOR VENA CAVA;  Surgeon: Jerry Penne Bruckner, MD;  Location: [facility] OR;  Service: Vascular;  Laterality: Right;   HPI:  Mr. Jerry [name] is an 84 yo presenting with weakness and lethargy. Admitted with severe sepsis likely from PNA with CT Chest showing L > R infiltrates and bronchiectasis. Previous swallow eval in March 2025 with normal appearing swallow. PMH includes: parkinsonism, dementia, prostate ca, CKDIII, HTN, HLD    Assessment / Plan / Recommendation  Clinical Impression  Pt has no overt signs of aspiration during clinical evaluation, and pt is without subjective complaints. Given his h/o parkinsonism, and h/o PNA earlier this year per chart, will f/u at least briefly with consideration for MBS to more closely evaluate oropharyngeal swallow.   Pt denies trouble swallowing but does say that he coughs occasionally at home. He drank approximately three  ounces without overt coughing, but he does have baseline wheezing throughout this exam that does not appear to worsen or change during intake. He has prolonged mastication with regular solids but when  offered a mechanical soft diet, he politely declines. Although prolonged, he seems to pace himself adequately.   SLP Visit Diagnosis: Dysphagia, unspecified (R13.10)      Swallow Evaluation Recommendations Recommendations: PO diet PO Diet Recommendation: Regular;Thin liquids (Level 0) Liquid Administration via: Cup;Straw Medication Administration: Whole meds with liquid Supervision: Staff to assist with self-feeding;Set-up assistance for safety Swallowing strategies  : Small bites/sips;Slow rate Postural changes: Position pt fully upright for meals;Stay upright 30-60 min after meals Oral care recommendations: Oral care BID (2x/day)   Assistance Recommended at Discharge    Functional Status Assessment Patient has had a recent decline in their functional status and demonstrates the ability to make significant improvements in function in a reasonable and predictable amount of time.  Frequency and Duration min 2x/week  2 weeks       Prognosis Prognosis for improved oropharyngeal function: Good      Swallow Study   General HPI: Mr. Jerry [name] is an 84 yo presenting with weakness and lethargy. Admitted with severe sepsis likely from PNA with CT Chest showing L > R infiltrates and bronchiectasis. Previous swallow eval in March 2025 with normal appearing swallow. PMH includes: parkinsonism, dementia, prostate ca, CKDIII, HTN, HLD Type of Study: Bedside Swallow Evaluation Previous Swallow Assessment: see HPI\ Diet Prior to this Study: Regular;Thin liquids (Level 0) Temperature Spikes Noted: Yes (101.9) Respiratory Status: Room air History of Recent Intubation: No Behavior/Cognition: Alert;Pleasant mood;Cooperative Oral Cavity Assessment: Within Functional Limits Oral Care Completed by SLP: No Oral Cavity - Dentition: Adequate natural dentition Vision: Functional for self-feeding Self-Feeding Abilities: Needs assist Patient Positioning: Upright in bed Baseline Vocal Quality:  Normal Volitional Cough: Weak Volitional Swallow: Able to elicit    Oral/Motor/Sensory Function Overall Oral Motor/Sensory Function: Within functional limits   Ice Chips Ice chips: Not tested   Thin Liquid Thin Liquid: Within functional limits Presentation: Self Fed;Straw    Nectar Thick Nectar Thick Liquid: Not tested   Honey Thick Honey Thick Liquid: Not tested   Puree Puree: Not tested   Solid     Solid: Impaired Presentation: Self Fed Oral Phase Functional Implications: Other (comment) (prolonged mastication)      Leita SAILOR., [name].A. CCC-SLP Acute Rehabilitation Services Office: [phone]  Secure chat preferred  11/17/2024,11:30 AM

## 2024-11-18 ENCOUNTER — Inpatient Hospital Stay (HOSPITAL_COMMUNITY)

## 2024-11-18 DIAGNOSIS — A419 Sepsis, unspecified organism: Secondary | ICD-10-CM | POA: Diagnosis not present

## 2024-11-18 DIAGNOSIS — R6520 Severe sepsis without septic shock: Secondary | ICD-10-CM | POA: Diagnosis not present

## 2024-11-18 LAB — BASIC METABOLIC PANEL WITH GFR
Anion gap: 10 (ref 5–15)
BUN: 57 mg/dL — ABNORMAL HIGH (ref 8–23)
CO2: 21 mmol/L — ABNORMAL LOW (ref 22–32)
Calcium: 8.8 mg/dL — ABNORMAL LOW (ref 8.9–10.3)
Chloride: 110 mmol/L (ref 98–111)
Creatinine, Ser: 2.22 mg/dL — ABNORMAL HIGH (ref 0.61–1.24)
GFR, Estimated: 29 mL/min — ABNORMAL LOW
Glucose, Bld: 104 mg/dL — ABNORMAL HIGH (ref 70–99)
Potassium: 4.6 mmol/L (ref 3.5–5.1)
Sodium: 141 mmol/L (ref 135–145)

## 2024-11-18 LAB — RESPIRATORY PANEL BY PCR

## 2024-11-18 LAB — MAGNESIUM: Magnesium: 2 mg/dL (ref 1.7–2.4)

## 2024-11-18 LAB — STREP PNEUMONIAE URINARY ANTIGEN: Strep Pneumo Urinary Antigen: NEGATIVE

## 2024-11-18 LAB — PHOSPHORUS: Phosphorus: 1.6 mg/dL — ABNORMAL LOW (ref 2.5–4.6)

## 2024-11-18 LAB — PROCALCITONIN: Procalcitonin: 4.11 ng/mL

## 2024-11-18 LAB — C-REACTIVE PROTEIN: CRP: 21.8 mg/dL — ABNORMAL HIGH

## 2024-11-18 MED ORDER — LACTATED RINGERS IV SOLN: INTRAVENOUS | Status: DC

## 2024-11-18 MED ORDER — AZITHROMYCIN 500 MG PO TABS
500.0000 mg | ORAL_TABLET | Freq: Every day | ORAL | Status: AC
  Administered 2024-11-19 – 2024-11-21 (×3): 500 mg via ORAL
  Filled 2024-11-18 (×3): qty 1

## 2024-11-18 MED ORDER — SODIUM PHOSPHATES 45 MMOLE/15ML IV SOLN
30.0000 mmol | Freq: Once | INTRAVENOUS | Status: AC
  Administered 2024-11-18: 30 mmol via INTRAVENOUS
  Filled 2024-11-18: qty 10

## 2024-11-18 MED ORDER — LINEZOLID 600 MG/300ML IV SOLN
600.0000 mg | Freq: Two times a day (BID) | INTRAVENOUS | Status: DC
  Administered 2024-11-18 – 2024-11-22 (×8): 600 mg via INTRAVENOUS
  Filled 2024-11-18 (×9): qty 300

## 2024-11-18 MED ORDER — LINEZOLID 600 MG PO TABS
600.0000 mg | ORAL_TABLET | Freq: Two times a day (BID) | ORAL | Status: DC
  Administered 2024-11-18: 600 mg via ORAL
  Filled 2024-11-18: qty 1

## 2024-11-18 MED ORDER — PROSOURCE PLUS PO LIQD
30.0000 mL | Freq: Two times a day (BID) | ORAL | Status: DC
  Administered 2024-11-18 – 2024-11-26 (×15): 30 mL via ORAL
  Filled 2024-11-18 (×15): qty 30

## 2024-11-18 MED ORDER — SODIUM CHLORIDE 0.9 % IV SOLN
1.0000 g | Freq: Two times a day (BID) | INTRAVENOUS | Status: DC
  Administered 2024-11-18 – 2024-11-24 (×13): 1 g via INTRAVENOUS
  Filled 2024-11-18 (×13): qty 20

## 2024-11-18 MED ORDER — IBUPROFEN 400 MG PO TABS
400.0000 mg | ORAL_TABLET | Freq: Once | ORAL | Status: AC
  Administered 2024-11-18: 400 mg via ORAL
  Filled 2024-11-18: qty 1

## 2024-11-18 NOTE — Care Management Important Message (Signed)
 Important Message  Patient Details  Name: Jerry [name]. MRN: [id number] Date of Birth: [birth year]   Important Message Given:  Yes - Medicare IM     Claretta Deed 11/18/2024, 3:37 PM

## 2024-11-18 NOTE — TOC Initial Note (Signed)
 Transition of Care (TOC) - Initial/Assessment Note    Patient Details  Name: Jerry [name]. 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 Cared for at home by wife.  He was brought in due to severe weakness and lethargy.  Patient lives with his wife. She provides assistance at home, manages medications, does driving, will be able to provide transport home at d/c.   Patient has PCP, DME reviewed - rollator, RW, Shower bench.   PT rec for Jerry [name] therapy if wife able to provide 24/7 care. CM did speak with patient and wife about this. Wife reports she is not able to provide a lot of physical support and both are interested in rehab before d/c to home.   CM did inform OT of this before eval being done. If OT agrees with rehab/SNF, CM will reach out to PT for updated rec.   CM will continue to follow.   Expected Discharge Plan:  (TBD) Barriers to Discharge: Continued Medical Work up   Patient Goals and CMS Choice            Expected Discharge Plan and Services       Living arrangements for the past 2 months: Single Family Home                                      Prior Living Arrangements/Services Living arrangements for the past 2 months: Single Family Home Lives with:: Self, Spouse Patient language and need for interpreter reviewed:: Yes Do you feel safe going back to the place where you live?: Yes      Need for Family Participation in Patient Care: Yes (Comment) Care giver support system in place?: Yes (comment) Current home services: DME (rollator, RW, shower bench,) Criminal Activity/Legal Involvement Pertinent  to Current Situation/Hospitalization: No - Comment as needed  Activities of Daily Living   ADL Screening (condition at time of admission) Independently performs ADLs?: No Does the patient have a NEW difficulty with bathing/dressing/toileting/self-feeding that is expected to last >3 days?: Yes (Initiates electronic notice to provider for possible OT consult) Does the patient have a NEW difficulty with getting in/out of bed, walking, or climbing stairs that is expected to last >3 days?: Yes (Initiates electronic notice to provider for possible PT consult) Does the patient have a NEW difficulty with communication that is expected to last >3 days?: No Is the patient deaf or have difficulty hearing?: No Does the patient have difficulty seeing, even when wearing glasses/contacts?: No Does the patient have difficulty concentrating, remembering, or making decisions?: Yes  Permission Sought/Granted                  Emotional Assessment Appearance:: Developmentally appropriate Attitude/Demeanor/Rapport: Engaged Affect (typically observed): Calm Orientation: : Oriented to Self, Oriented to Place, Oriented to  Time, Oriented to Situation Alcohol / Substance Use: Not Applicable Psych Involvement: No (comment)  Admission diagnosis:  Severe sepsis (HCC) [A41.9, R65.20] Pneumonia of both lower lobes due to infectious organism [J18.9] Sepsis, due to unspecified  organism, unspecified whether acute organ dysfunction present Jerry [name]) [A41.9] Patient Active Problem List   Diagnosis Date Noted   Severe sepsis (HCC) 11/15/2024   History of pulmonary embolism 11/15/2024   Venous thromboembolism 05/15/2024   Iron  deficiency anemia 04/14/2024   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) 03/05/2024   Multiple subsegmental pulmonary emboli without acute cor pulmonale (HCC) 03/03/2024   Acute on chronic hypoxic respiratory failure (HCC) 03/02/2024   GI bleed 02/21/2024   DVT  (deep venous thrombosis) (HCC) 02/21/2024   Hyperkalemia 02/21/2024   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) 02/21/2024   Hypotension 02/21/2024   Vascular dementia (HCC) 02/21/2024   Non-insulin  dependent type 2 diabetes mellitus (HCC) 02/21/2024   Acute pulmonary embolism (HCC) 02/16/2024   SOB (shortness of breath) 02/15/2024   Moderate pulmonary hypertension (HCC) 02/12/2024   Type 2 MI (myocardial infarction) (HCC) 01/30/2024   Shortness of breath 01/27/2024   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) 01/22/2024   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 12/21/2023   Complication of Ileal conduit 12/21/2023   Bilateral leg edema 12/13/2023   Screening for diabetes mellitus 12/13/2023   Malnutrition of moderate degree 06/28/2023   CAP (community acquired pneumonia) 06/26/2023   History of malignant neoplasm of bladder 07/21/2021   Allergic rhinitis 07/21/2021   Asthma, chronic 07/21/2021   Essential hypertension 07/21/2021   Degenerative cervical spinal stenosis 07/21/2021   Mixed hyperlipidemia 07/21/2021   Tremor 07/21/2021   Vitamin D  deficiency 07/21/2021   OSA (obstructive sleep apnea) 07/12/2021   Asthma, mild intermittent    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 Jerry [name] [place] [address] Phone: [phone] Fax: [phone]     Social Drivers of Health (SDOH) Social History: SDOH Screenings   Food Insecurity: No Food Insecurity (11/16/2024)  Housing: Low Risk (11/16/2024)  Transportation Needs: No Transportation Needs (11/16/2024)  Utilities: Not  At Risk (11/16/2024)  Depression (PHQ2-9): Low Risk (04/14/2024)  Social Connections: Moderately Isolated (11/16/2024)  Tobacco Use: Low Risk (11/15/2024)   SDOH Interventions:     Readmission Risk Interventions    02/18/2024    2:20 PM 06/27/2023   12:12 PM  Readmission Risk Prevention Plan  Transportation Screening Complete Complete  PCP or Specialist Appt within 5-7 Days  Complete  PCP or Specialist Appt within 3-5 Days Complete   Home Care Screening  Complete  Medication Review (RN CM)  Complete  HRI or Home Care Consult Complete   Palliative Care Screening Not Applicable   Medication Review (RN Care Manager) Complete

## 2024-11-18 NOTE — Progress Notes (Signed)
 Speech Language Pathology Treatment: Dysphagia  Patient Details Name: Jerry [name]. MRN: [id number] DOB: 04/08/1941 Today's Date: 11/18/2024 Time: 8940-8884 SLP Time Calculation (min) (ACUTE ONLY): 16 min  Assessment / Plan / Recommendation Clinical Impression  Pt was fatigued after transferring from the chair to bed, requiring increased rest breaks with PO intake. Again, no signs clinically concerning for aspiration were observed but today, he did report episodes of coughing with PO intake PTA. Pt is agreeable with proceeding with an MBS for further assessment given his subjective complaints in addition to his history of parkinsonism and recurrence of PNA. SLP will f/u to complete MBS as scheduling allows.   HPI HPI: Mr. Ramone is an 84 yo presenting with weakness and lethargy. Admitted with severe sepsis likely from PNA with CT Chest showing L > R infiltrates and bronchiectasis. Previous swallow eval in March 2025 with normal appearing swallow. PMH includes: parkinsonism, dementia, prostate ca, CKDIII, HTN, HLD      SLP Plan  MBS        Swallow Evaluation Recommendations   Recommendations: PO diet PO Diet Recommendation: Regular;Thin liquids (Level 0) Liquid Administration via: Cup;Straw Medication Administration: Whole meds with liquid Supervision: Staff to assist with self-feeding;Set-up assistance for safety Postural changes: Position pt fully upright for meals Oral care recommendations: Oral care BID (2x/day)     Recommendations                     Oral care BID   Set up Supervision/Assistance Dysphagia, unspecified (R13.10)     MBS     Damien Blumenthal, M.A., CCC-SLP Speech Language Pathology, Acute Rehabilitation Services  Secure Chat preferred [phone]   11/18/2024, 12:12 PM

## 2024-11-18 NOTE — Plan of Care (Signed)

## 2024-11-18 NOTE — Progress Notes (Addendum)
 " Progress Note   Patient: Jerry [name]. FMW:[id number] DOB: [birth year] DOA: 11/15/2024     3 DOS: the patient was seen and examined on 11/18/2024    Brief hospital course:  Mr. 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  Patient was febrile on arrival to the ED, diagnosed with sepsis and started on IV antibiotics for infection, likely pulmonary source.   Subjective: In bed denies any headache, had fever last night, no chest pain, does have a cough, no shortness of breath, no abdominal pain no weakness.   Assessment and Plan:  Sepsis due to community  acquired pneumonia in a patient with history of smoking in the past and evidence of bronchiectasis on imaging- on appropriate antibiotics, blood cultures negative thus far, MRSA nasal screen negative, sepsis pathophysiology is improving, he has been adequately hydrated, Sugars are pending, he spiked a temp of 101 early morning of 11/18/2024 despite being on Unasyn  and azithromycin , since he has underlying history of bronchiectasis I will broaden his coverage for Pseudomonas and he will now be placed on meropenem  along with azithromycin  and Zyvox  starting 11/18/2024.    Encouraged to sit in chair use I-S and flutter valve for pulmonary toiletry, chest PT with chest vest ordered, monitor cultures closely.   History of pulmonary embolism  - CT angio chest 02/15/2024 showed bilateral lower lobe PE, has IVC filter.  VQ scan this admission negative.  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) - patient has history of CKD3b. Baseline creat rose to 2, AKI due to sepsis improved after hydration.  Prediabetes - Last A1c 6.2% on 12/27/2023, Regular diet for now  given poor p.o. intake.  PCP to monitor.  Vascular dementia (HCC) and parkinsonism- Continue Xanax  as needed Lexapro , Namenda , Zyprexa , at risk for delirium minimize benzodiazepines and narcotics.  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  History of Renal cysts in the past. CT scan done in the ER shows possible ruptured left renal cyst, no symptoms, stable renal ultrasound here.      Physical Exam: BP 110/74 (BP Location: Left Arm)   Pulse (!) 109   Temp 99 F (37.2 C) (Oral)   Resp (!) 24   Ht 5' 11 (1.803 m)   Wt 83.9 kg   SpO2 98%   BMI 25.80 kg/m     Awake Alert, No new F.N deficits, Normal affect [place].AT,PERRAL Supple Neck, No JVD,   Symmetrical Chest wall movement, Good air movement bilaterally,+ve rales RRR,No Gallops, Rubs or new Murmurs,  +ve B.Sounds, Abd Soft, No tenderness,   No Cyanosis, Clubbing or edema     Data Review:   Patient Lines/Drains/Airways Status     Active Line/Drains/Airways     Name Placement date Placement time Site Days   Peripheral IV 11/15/24 20 G Anterior;Distal;Right Forearm 11/15/24  1221  Forearm  3   Urostomy Other (Comment) RLQ --  --  RLQ  --             DVT prophylaxis.  Lovenox .   Family communication.  Updated wife in detail on 11/18/2024 over  the phone.    Inpatient Medications  Scheduled Meds:  (feeding supplement) PROSource Plus  30 mL Oral BID BM   amLODipine   10 mg Oral Daily   enoxaparin  (LOVENOX ) injection  30 mg Subcutaneous Q24H   escitalopram   10 mg Oral Daily   fluticasone  furoate-vilanterol  1 puff Inhalation Daily   linezolid   600 mg Oral Q12H   memantine   10 mg Oral BID   sodium chloride  flush  3 mL Intravenous Q12H   Continuous Infusions:  azithromycin  500 mg (11/18/24 0454)   meropenem  (MERREM ) IV 1 g (11/18/24 0609)    sodium PHOSPHATE  IVPB (in mmol) 30 mmol (11/18/24 0631)   PRN Meds:.acetaminophen  **OR** acetaminophen , ALPRAZolam , hydrALAZINE , levalbuterol , OLANZapine , ondansetron  **OR** ondansetron  (ZOFRAN ) IV  DVT Prophylaxis  enoxaparin  (LOVENOX ) injection 30 mg Start: 11/16/24 1000   Recent Labs  Lab 11/15/24 1208 11/15/24 1232 11/15/24 1240 11/16/24 0305 11/17/24 0314  WBC 24.0*  --   --  26.1* 19.2*  HGB 10.5* 12.2* 12.2* 10.9* 9.3*  HCT 36.6* 36.0* 36.0* 37.2* 31.4*  PLT 235  --   --  229 230  MCV 77.7*  --   --  76.9* 75.5*  MCH 22.3*  --   --  22.5* 22.4*  MCHC 28.7*  --   --  29.3* 29.6*  RDW 16.0*  --   --  16.3* 16.0*  LYMPHSABS 1.8  --   --  0.8  --   MONOABS 2.8*  --   --  0.8  --   EOSABS 0.0  --   --  0.0  --   BASOSABS 0.1  --   --  0.0  --     Recent Labs  Lab 11/15/24 1208 11/15/24 1232 11/15/24 1233 11/15/24 1240 11/15/24 1434 11/15/24 1648 11/16/24 0305 11/16/24 1647 11/17/24 0314 11/17/24 0715 11/18/24 0315  NA 138 140  --  139  --   --  140  --  139  --  141  K 5.4* 5.2*  --  5.2*  --   --  5.6* 5.3* 5.0  --  4.6  CL 104  --   --  106  --   --  106  --  109  --  110  CO2 23  --   --   --   --   --  23  --  23  --  21*  ANIONGAP 11  --   --   --   --   --  11  --  7  --  10  GLUCOSE 111*  --   --  112*  --   --  151*  --  102*  --  104*  BUN 57*  --   --  56*  --   --  55*  --  57*  --  57*  CREATININE 2.60*  --   --  2.90*  --   --  2.34*  --  2.04*  --  2.22*  AST 17  --   --   --   --   --   --   --   --   --   --   ALT 10  --   --   --   --   --   --   --   --   --   --   ALKPHOS 85  --   --   --   --   --   --   --   --   --   --  BILITOT 0.6  --   --   --   --   --   --   --   --   --   --   ALBUMIN  4.1  --   --   --   --   --   --   --   --   --   --   CRP  --   --   --   --   --   --   --   --   --  15.5* 21.8*  DDIMER 4.61*  --   --   --   --   --   --   --   --   --   --   PROCALCITON  --   --   --   --   --  8.25 8.63  --   --   --  4.11   LATICACIDVEN  --   --  2.0*  --  1.0  --   --   --   --   --   --   INR 1.3*  --   --   --   --   --  1.4*  --   --   --   --   MG 2.0  --   --   --   --   --  2.2  --   --   --  2.0  PHOS  --   --   --   --   --   --   --   --   --   --  1.6*  CALCIUM  9.7  --   --   --   --   --  9.6  --  8.5*  --  8.8*      Recent Labs  Lab 11/15/24 1208 11/15/24 1233 11/15/24 1434 11/15/24 1648 11/16/24 0305 11/17/24 0314 11/17/24 0715 11/18/24 0315  CRP  --   --   --   --   --   --  15.5* 21.8*  DDIMER 4.61*  --   --   --   --   --   --   --   PROCALCITON  --   --   --  8.25 8.63  --   --  4.11  LATICACIDVEN  --  2.0* 1.0  --   --   --   --   --   INR 1.3*  --   --   --  1.4*  --   --   --   MG 2.0  --   --   --  2.2  --   --  2.0  CALCIUM  9.7  --   --   --  9.6 8.5*  --  8.8*    --------------------------------------------------------------------------------------------------------------- Lab Results  Component Value Date   CHOL 119 01/29/2024   HDL 61 01/29/2024   LDLCALC 39 01/29/2024   TRIG 94 01/29/2024   CHOLHDL 2.0 01/29/2024    Lab Results  Component Value Date   HGBA1C 6.2 (H) 12/27/2023   No results for input(s): TSH, T4TOTAL, FREET4, T3FREE, THYROIDAB in the last 72 hours. No results for input(s): VITAMINB12, FOLATE, FERRITIN, TIBC, IRON , RETICCTPCT in the last 72 hours. ------------------------------------------------------------------------------------------------------------------ Cardiac Enzymes No results for input(s): CKMB, TROPONINI, MYOGLOBIN in the last 168 hours.  Invalid input(s): CK  Micro Results Recent Results (from the past 240 hours)  Blood Culture (routine x 2)  Status: None (Preliminary result)   Collection Time: 11/15/24 12:08 PM   Specimen: BLOOD RIGHT HAND  Result Value Ref Range Status   Specimen Description BLOOD RIGHT HAND  Final   Special Requests   Final    BOTTLES DRAWN AEROBIC AND ANAEROBIC Blood  Culture results may not be optimal due to an inadequate volume of blood received in culture bottles   Culture   Final    NO GROWTH 2 DAYS Performed at [facility], [address] [place], [place] [address]    Report Status PENDING  Incomplete  Blood Culture (routine x 2)     Status: None (Preliminary result)   Collection Time: 11/15/24 12:13 PM   Specimen: BLOOD LEFT HAND  Result Value Ref Range Status   Specimen Description BLOOD LEFT HAND  Final   Special Requests   Final    BOTTLES DRAWN AEROBIC AND ANAEROBIC Blood Culture adequate volume   Culture   Final    NO GROWTH 2 DAYS Performed at [facility], [address] [place], [place] [address]    Report Status PENDING  Incomplete  Resp panel by RT-PCR (RSV, Flu A&B, Covid) Anterior Nasal Swab     Status: None   Collection Time: 11/15/24 12:46 PM   Specimen: Anterior Nasal Swab  Result Value Ref Range Status   SARS Coronavirus 2 by RT PCR NEGATIVE NEGATIVE Final   Influenza A by PCR NEGATIVE NEGATIVE Final   Influenza B by PCR NEGATIVE NEGATIVE Final    Comment: (NOTE) The Xpert Xpress SARS-CoV-2/FLU/RSV plus assay is intended as an aid in the diagnosis of influenza from Nasopharyngeal swab specimens and should not be used as a sole basis for treatment. Nasal washings and aspirates are unacceptable for Xpert Xpress SARS-CoV-2/FLU/RSV testing.  Fact Sheet for Patients: bloggercourse.com  Fact Sheet for Healthcare Providers: seriousbroker.it  This test is not yet approved or cleared by the [place]  FDA and has been authorized for detection and/or diagnosis of SARS-CoV-2 by FDA under an Emergency Use Authorization (EUA). This EUA will remain in effect (meaning this test can be used) for the duration of the COVID-19 declaration under Section 564(b)(1) of the Act, 21 U.S.C. section 360bbb-3(b)(1), unless the authorization is terminated or revoked.      Resp Syncytial Virus by PCR NEGATIVE NEGATIVE Final    Comment: (NOTE) Fact Sheet for Patients: bloggercourse.com  Fact Sheet for Healthcare Providers: seriousbroker.it  This test is not yet approved or cleared by the [place]  FDA and has been authorized for detection and/or diagnosis of SARS-CoV-2 by FDA under an Emergency Use Authorization (EUA). This EUA will remain in effect (meaning this test can be used) for the duration of the COVID-19 declaration under Section 564(b)(1) of the Act, 21 U.S.C. section 360bbb-3(b)(1), unless the authorization is terminated or revoked.  Performed at [facility], [address], [place], [place] [address]   Respiratory (~20 pathogens) panel by PCR     Status: None   Collection Time: 11/15/24 12:46 PM   Specimen: Nasopharyngeal Swab; Respiratory  Result Value Ref Range Status   Adenovirus NOT DETECTED NOT DETECTED Final   Coronavirus 229E NOT DETECTED NOT DETECTED Final    Comment: (NOTE) The Coronavirus on the Respiratory Panel, DOES NOT test for the novel  Coronavirus (2019 nCoV)    Coronavirus HKU1 NOT DETECTED NOT DETECTED Final   Coronavirus NL63 NOT DETECTED NOT DETECTED Final   Coronavirus OC43 NOT DETECTED NOT DETECTED Final  Metapneumovirus NOT DETECTED NOT DETECTED Final   Rhinovirus / Enterovirus NOT DETECTED NOT DETECTED Final   Influenza A NOT DETECTED NOT DETECTED Final   Influenza B NOT DETECTED NOT DETECTED Final   Parainfluenza Virus 1 NOT DETECTED NOT DETECTED Final   Parainfluenza Virus 2 NOT DETECTED NOT DETECTED Final   Parainfluenza Virus 3 NOT DETECTED NOT DETECTED Final   Parainfluenza Virus 4 NOT DETECTED NOT DETECTED Final   Respiratory Syncytial Virus NOT DETECTED NOT DETECTED Final   Bordetella pertussis NOT DETECTED NOT DETECTED Final   Bordetella Parapertussis NOT DETECTED NOT DETECTED Final   Chlamydophila pneumoniae NOT DETECTED NOT DETECTED  Final   Mycoplasma pneumoniae NOT DETECTED NOT DETECTED Final    Comment: Performed at [facility], [address], [place], [place] [address]  MRSA Next Gen by PCR, Nasal     Status: None   Collection Time: 11/16/24 12:40 PM   Specimen: Nasal Mucosa; Nasal Swab  Result Value Ref Range Status   MRSA by PCR Next Gen NOT DETECTED NOT DETECTED Final    Comment: (NOTE) The GeneXpert MRSA Assay (FDA approved for NASAL specimens only), is one component of a comprehensive MRSA colonization surveillance program. It is not intended to diagnose MRSA infection nor to guide or monitor treatment for MRSA infections. Test performance is not FDA approved in patients less than 52 years old. Performed at [facility], [address], [place], [place] [address]     Radiology Reports  DG Chest [place] 1 View Result Date: 11/18/2024 CLINICAL DATA:  Shortness of breath. EXAM: PORTABLE CHEST 1 VIEW COMPARISON:  11/17/2024 FINDINGS: Patchy airspace disease noted in the left lung base, similar to prior. Small left pleural effusion. Right lung remains clear. Cardiopericardial silhouette is at upper limits of normal for size. No acute bony abnormality. IMPRESSION: Patchy airspace disease at the left base with small left pleural effusion, similar to prior. Electronically Signed   By: Camellia Candle M.D.   On: 11/18/2024 07:32   US  RENAL Result Date: 11/17/2024 EXAM: US  Retroperitoneum Complete, Renal. 11/17/2024 01:54:10 PM TECHNIQUE: Real-time ultrasonography of the retroperitoneum renal was performed. COMPARISON: US  Renal 11/16/2023. CT abdomen and pelvis 11/15/2024. CLINICAL HISTORY: AKI (acute kidney injury). FINDINGS: FINDINGS: RIGHT KIDNEY/URETER: Right kidney measures 11.3 x 5.2 x 4.9 cm. Normal cortical echogenicity. Cyst in the upper pole measuring 2.8 x 3.4 x 2.4 cm. No change since prior study. No imaging follow-up is indicated. No hydronephrosis. No calculus. No mass. LEFT KIDNEY/URETER: Left kidney  measures 12.4 x 6.2 x 5.4 cm. Normal cortical echogenicity. Cyst in the mid pole measuring 4.1 x 4.1 x 5.3 cm. No change since prior study. No imaging follow-up is indicated. No hydronephrosis. No calculus. No mass. BLADDER: Surgical absence of the bladder. IMPRESSION: 1. No acute findings. Electronically signed by: Elsie Gravely MD 11/17/2024 09:32 PM EST RP Workstation: [id number]   NM Pulmonary Perfusion Result Date: 11/17/2024 EXAM: NM Lung Perfusion Scan. CLINICAL HISTORY: Pulmonary embolism (PE) suspected, high prob. TECHNIQUE: Radiolabeled MAA was administered intravenously and planar images of the lungs were obtained in multiple projections. RADIOPHARMACEUTICAL: 4.37 millicurie Technetium Albumin  Aggregated (MAA) injection solution 4.4 millicurie Technetium-51m Albumin  Aggregated. COMPARISON: Chest radiograph 11/17/2024 and CT chest 11/15/2024. FINDINGS: PERFUSION: There is decreased perfusion to the left lower lobe. However, there is severe bronchiectasis and atelectasis within the left lower lobe on comparison CT. No unmatched wedge shaped peripheral perfusion defect to suggest acute pulmonary embolism. IMPRESSION: 1. No evidence of acute pulmonary  embolism. 2. Bronchiectasis and atelectasis in the left lower lobe. Electronically signed by: Norleen Boxer MD 11/17/2024 10:41 AM EST RP Workstation: [id number]   DG Chest Port 1 View Result Date: 11/17/2024 EXAM: 1 VIEW(S) XRAY OF THE CHEST 11/17/2024 06:28:00 AM COMPARISON: Chest CT without contrast 11/15/2024. CLINICAL HISTORY: SOB (shortness of breath). FINDINGS: LUNGS AND PLEURA: Left pleural effusion is increased. Infrahilar bronchiectasis in the lower lobes was better seen with CT. The lungs are mildly emphysematous and again showing scattered airspace disease in the left lung base. No new or worsening infiltrate is evident. No pneumothorax. HEART AND MEDIASTINUM: No acute abnormality of the cardiac and mediastinal silhouettes. BONES AND SOFT  TISSUES: Degenerative change and mild dextroscoliosis of the thoracic spine. IMPRESSION: 1. Increased small  left pleural effusion. 2. Mild emphysema with scattered left basilar airspace disease, without new or worsening infiltrate. 3. Infrahilar bilateral cylindrical lower lobe bronchiectasis, better seen on CT. Electronically signed by: Francis Quam MD 11/17/2024 06:49 AM EST RP Workstation: [id number]      Signature  -   Lavada Stank M.D on 11/18/2024 at 7:59 AM   -  To page go to [email or web address]        "

## 2024-11-18 NOTE — Consult Note (Addendum)
 WOC Nurse ostomy follow up Pt is familiar to WOC team from previous admissions and follow-up at the outpatient ostomy clinic for a Urostomy which has been present several years.  He states he changes the pouch prior to admission but does not have any supplies. Current urostomy pouch is intact with good seal, mod amt yellow urine. To bedside drainage bag.   3 sets of supplies ordered to the room for patient and staff nurses' use: Order Supplies: 2pc. Convex urostomy wafer Soila [phone]))  and pouch Soila # [phone]) adapter to hook.  Please re-consult if further assistance is needed.  Thank-you,  Stephane Fought MSN, RN, CWOCN, CWCN-AP, CNS Contact Mon-Fri 0700-1500: [phone]

## 2024-11-18 NOTE — Plan of Care (Signed)
 RN reported that patient's seems sleepy/lethargy and due to high risk for aspiration switching oral to IV Zyvox .  Repeat chest x-ray showing persistent consolidation and pneumonia.   Continue Zyprexa .  Jerry Stecklein, MD [facility] 11/18/2024, 10:36 PM

## 2024-11-18 NOTE — TOC Progression Note (Signed)
 Transition of Care (TOC) - Progression Note    Patient Details  Name: Jerry [name]. MRN: [id number] Date of Birth: May 22, 1941  [facility] of Care [facility] CM/SW Contact  Inocente GORMAN Kindle, LCSW Phone Number: 11/18/2024, 3:03 PM  Clinical Narrative:    12pm-Spouse not at bedside.  2:30pm-CSW spoke with patient's spouse via phone and she reported agreement with SNF. She is not aware of facilities in the area. CSW will plan on delivering SNF bed offers and Medicare ratings list.   Expected Discharge Plan: Skilled Nursing Facility Barriers to Discharge: Continued Medical Work up, SNF Pending bed offer               Expected Discharge Plan and Services In-house Referral: Clinical Social Work   Post Acute Care Choice: Skilled Nursing Facility Living arrangements for the past 2 months: Single Family Home                                       Social Drivers of Health (SDOH) Interventions SDOH Screenings   Food Insecurity: No Food Insecurity (11/16/2024)  Housing: Low Risk (11/16/2024)  Transportation Needs: No Transportation Needs (11/16/2024)  Utilities: Not At Risk (11/16/2024)  Depression (PHQ2-9): Low Risk (04/14/2024)  Social Connections: Moderately Isolated (11/16/2024)  Tobacco Use: Low Risk (11/15/2024)    Readmission Risk Interventions    11/18/2024    3:02 PM 02/18/2024    2:20 PM 06/27/2023   12:12 PM  Readmission Risk Prevention Plan  Transportation Screening Complete Complete Complete  PCP or Specialist Appt within 5-7 Days   Complete  PCP or Specialist Appt within 3-5 Days  Complete   Home Care Screening   Complete  Medication Review (RN CM)   Complete  HRI or Home Care Consult  Complete   Palliative Care Screening  Not Applicable   Medication Review (RN Care Manager) Complete Complete   PCP or Specialist appointment within 3-5 days of discharge Complete    HRI or Home Care Consult Complete    SW Recovery Care/Counseling Consult Complete    Palliative Care  Screening Not Applicable    Skilled Nursing Facility Complete

## 2024-11-18 NOTE — [place] FL2 (Signed)
 " [place]  MEDICAID FL2 LEVEL OF CARE FORM     IDENTIFICATION  Patient Name: Jerry [name]. Birthdate: Apr 08, 1941 Sex: male Admission Date (Current Location): 11/15/2024  [facility] [place] and [place] Number:  Producer, Television/film/video and Address:  The [facility], 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) 03/05/2024   Multiple subsegmental pulmonary emboli without acute cor pulmonale (HCC) 03/03/2024   Acute on chronic hypoxic respiratory failure (HCC) 03/02/2024   GI bleed 02/21/2024   DVT (deep venous thrombosis) (HCC) 02/21/2024   Hyperkalemia 02/21/2024   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) 02/21/2024   Hypotension 02/21/2024   Vascular dementia (HCC) 02/21/2024   Non-insulin  dependent type 2 diabetes mellitus (HCC) 02/21/2024   Acute pulmonary embolism (HCC) 02/16/2024   SOB (shortness of breath) 02/15/2024   Moderate pulmonary hypertension (HCC) 02/12/2024   Type 2 MI (myocardial infarction) (HCC) 01/30/2024   Shortness of breath 01/27/2024   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) 01/22/2024   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 12/21/2023   Complication of Ileal conduit 12/21/2023   Bilateral leg edema 12/13/2023   Screening for diabetes mellitus 12/13/2023   Malnutrition of moderate degree  06/28/2023   CAP (community acquired pneumonia) 06/26/2023   History of malignant neoplasm of bladder 07/21/2021   Allergic rhinitis 07/21/2021   Asthma, chronic 07/21/2021   Essential hypertension 07/21/2021   Degenerative cervical spinal stenosis 07/21/2021   Mixed hyperlipidemia 07/21/2021   Tremor 07/21/2021   Vitamin D  deficiency 07/21/2021   OSA (obstructive sleep apnea) 07/12/2021   Asthma, mild intermittent    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   Syncope 04/17/2015   Deficiency anemia 04/17/2015   Hyperglycemia 04/17/2015   Renal insufficiency 04/17/2015   Faintness     Orientation RESPIRATION BLADDER Height & Weight     Self, Situation, Time, Place  Normal Continent Weight: 185 lb (83.9 kg) Height:  5' 11 (180.3 cm)  BEHAVIORAL SYMPTOMS/MOOD NEUROLOGICAL BOWEL NUTRITION STATUS      Continent Diet (See dc summary)  AMBULATORY STATUS COMMUNICATION OF NEEDS Skin   Extensive Assist Verbally Normal                       Personal Care Assistance Level of Assistance  Bathing, Feeding, Dressing Bathing Assistance: Limited assistance Feeding assistance: Limited assistance Dressing Assistance: Limited assistance     Functional Limitations Info  Sight Sight Info: Impaired        SPECIAL CARE FACTORS FREQUENCY  PT (By licensed PT), OT (By licensed OT)     PT Frequency: 5x/week OT Frequency: 5x/week            Contractures Contractures Info: Not present    Additional Factors Info  Code Status, Allergies Code Status Info: Full Allergies Info:  NKA           Current Medications (11/18/2024):  This is the current hospital active medication list Current Facility-Administered Medications  Medication Dose Route Frequency Provider Last Rate Last Admin   (feeding supplement) PROSource Plus liquid 30 mL  30 mL Oral BID BM Singh, Prashant K, MD   30 mL at 11/18/24 9162   acetaminophen  (TYLENOL ) tablet 650 mg  650 mg Oral Q6H  PRN Patsy Lenis, MD   650 mg at 11/18/24 9573   Or   acetaminophen  (TYLENOL ) suppository 650 mg  650 mg Rectal Q6H PRN Patsy Lenis, MD       ALPRAZolam  (XANAX ) tablet 0.5 mg  0.5 mg Oral BID PRN Patsy Lenis, MD   0.5 mg at 11/17/24 2217   amLODipine  (NORVASC ) tablet 10 mg  10 mg Oral Daily Singh, Prashant K, MD   10 mg at 11/18/24 0836   azithromycin  (ZITHROMAX ) 500 mg in sodium chloride  0.9 % 250 mL IVPB  500 mg Intravenous Q24H Singh, Prashant K, MD 250 mL/hr at 11/18/24 0454 500 mg at 11/18/24 0454   enoxaparin  (LOVENOX ) injection 30 mg  30 mg Subcutaneous Q24H Patsy Lenis, MD   30 mg at 11/18/24 9163   escitalopram  (LEXAPRO ) tablet 10 mg  10 mg Oral Daily Patsy Lenis, MD   10 mg at 11/17/24 2217   fluticasone  furoate-vilanterol (BREO ELLIPTA ) 200-25 MCG/ACT 1 puff  1 puff Inhalation Daily Gretel Prentice BIRCH, RPH   1 puff at 11/18/24 9163   hydrALAZINE  (APRESOLINE ) injection 10 mg  10 mg Intravenous Q6H PRN Singh, Prashant K, MD       levalbuterol  (XOPENEX ) nebulizer solution 0.63 mg  0.63 mg Nebulization Q6H PRN Sundil, Subrina, MD   0.63 mg at 11/17/24 2041   linezolid  (ZYVOX ) tablet 600 mg  600 mg Oral Q12H Singh, Prashant K, MD   600 mg at 11/18/24 9068   memantine  (NAMENDA ) tablet 10 mg  10 mg Oral BID Patsy Lenis, MD   10 mg at 11/18/24 9162   meropenem  (MERREM ) 1 g in sodium chloride  0.9 % 100 mL IVPB  1 g Intravenous Q12H Mattie Marvetta SQUIBB, RPH 200 mL/hr at 11/18/24 9390 1 g at 11/18/24 9390   OLANZapine  (ZYPREXA ) tablet 2.5 mg  2.5 mg Oral BID PRN Patsy Lenis, MD       ondansetron  (ZOFRAN ) tablet 4 mg  4 mg Oral Q6H PRN Patsy Lenis, MD       Or   ondansetron  (ZOFRAN ) injection 4 mg  4 mg Intravenous Q6H PRN Patsy Lenis, MD       sodium chloride  flush (NS) 0.9 % injection 3 mL  3 mL Intravenous Q12H Girguis, David, MD   3 mL at 11/18/24 9162   sodium phosphate  30 mmol in sodium chloride  0.9 % 250 mL infusion  30 mmol Intravenous Once Singh, Prashant K, MD 43 mL/hr  at 11/18/24 0631 30 mmol at 11/18/24 0631     Discharge Medications: Please see discharge summary for a list of discharge medications.  Relevant Imaging Results:  Relevant Lab Results:   Additional Information SSN    [id number]  Inocente GORMAN Kindle, LCSW     "

## 2024-11-18 NOTE — Evaluation (Signed)
 Occupational Therapy Evaluation Patient Details Name: Jerry [name]. MRN: [id number] DOB: 1941-05-27 Today's Date: 11/18/2024   History of Present Illness   Jerry [name] is an 84 yo presenting with weakness and lethargy. Admitted with severe sepsis likely from PNA with CT Chest showing L > R infiltrates and bronchiectasis. Previous swallow eval in March 2025 with normal appearing swallow. PMH includes: parkinsonism, dementia, prostate ca, CKDIII, HTN, HLD     Clinical Impressions Patient admitted for the diagnosis above.  PTA he lives at home with his spouse, continued to perform his own ADL, spouse assisted with iADL and meals.  Currently needing up to Max A for ADL completion, with deficits listed below.  Spouse unable to provide the needed 24 hour up to Max A at home, Patient will benefit from continued inpatient follow up therapy, <3 hours/day.  OT will continue efforts in the acute setting to address deficits.       If plan is discharge home, recommend the following:   A lot of help with walking and/or transfers;A lot of help with bathing/dressing/bathroom;Assist for transportation;Assistance with cooking/housework     Functional Status Assessment   Patient has had a recent decline in their functional status and demonstrates the ability to make significant improvements in function in a reasonable and predictable amount of time.     Equipment Recommendations   None recommended by OT     Recommendations for Other Services         Precautions/Restrictions   Precautions Precautions: Fall Recall of Precautions/Restrictions: Intact Restrictions Weight Bearing Restrictions Per Provider Order: No     Mobility Bed Mobility Overal bed mobility: Needs Assistance Bed Mobility: Supine to Sit     Supine to sit: Mod assist          Transfers Overall transfer level: Needs assistance Equipment used: Rolling walker (2 wheels) Transfers: Sit to/from Stand, Bed to  chair/wheelchair/BSC Sit to Stand: Mod assist     Step pivot transfers: Mod assist            Balance Overall balance assessment: Needs assistance Sitting-balance support: Feet supported Sitting balance-Leahy Scale: Poor   Postural control: Posterior lean, Right lateral lean Standing balance support: Reliant on assistive device for balance Standing balance-Leahy Scale: Poor                             ADL either performed or assessed with clinical judgement   ADL Overall ADL's : Needs assistance/impaired Eating/Feeding: Set up;Sitting   Grooming: Set up;Sitting   Upper Body Bathing: Minimal assistance;Sitting   Lower Body Bathing: Maximal assistance;Sit to/from stand   Upper Body Dressing : Minimal assistance;Sitting;Moderate assistance   Lower Body Dressing: Maximal assistance;Sit to/from stand   Toilet Transfer: Moderate assistance;Stand-pivot;BSC/3in1                   Vision Patient Visual Report: No change from baseline       Perception Perception: Not tested       Praxis Praxis: Not tested       Pertinent Vitals/Pain Pain Assessment Pain Assessment: Faces Faces Pain Scale: Hurts little more Pain Location: R leg Pain Descriptors / Indicators: Sore Pain Intervention(s): Monitored during session     Extremity/Trunk Assessment Upper Extremity Assessment Upper Extremity Assessment: Generalized weakness   Lower Extremity Assessment Lower Extremity Assessment: Defer to PT evaluation   Cervical / Trunk Assessment Cervical / Trunk Assessment: Kyphotic   Communication  Communication Communication: Impaired Factors Affecting Communication: Difficulty expressing self   Cognition Arousal: Alert Behavior During Therapy: WFL for tasks assessed/performed Cognition: No apparent impairments             OT - Cognition Comments: Slowed mentation and responses                 Following commands: Intact Following commands  impaired: Follows one step commands with increased time     Cueing  General Comments   Cueing Techniques: Verbal cues;Tactile cues   VSS on RA   Exercises     Shoulder Instructions      Home Living Family/patient expects to be discharged to:: Private residence Living Arrangements: Spouse/significant other Available Help at Discharge: Available 24 hours/day Type of Home: House Home Access: Stairs to enter Secretary/administrator of Steps: 3 Entrance Stairs-Rails: Right Home Layout: One level     Bathroom Shower/Tub: Producer, Television/film/video: Handicapped height         Additional Comments: recently with HHOT, PT and RN      Prior Functioning/Environment Prior Level of Function : Independent/Modified Independent             Mobility Comments: reports using cane vs RW if going outside of the home ADLs Comments: Indep with ADLs, wife assists with IADLs    OT Problem List: Decreased strength;Decreased activity tolerance;Impaired balance (sitting and/or standing);Pain   OT Treatment/Interventions: Self-care/ADL training;Balance training;Therapeutic activities;DME and/or AE instruction      OT Goals(Current goals can be found in the care plan section)   Acute Rehab OT Goals Patient Stated Goal: Return home OT Goal Formulation: With patient Time For Goal Achievement: 12/02/24 Potential to Achieve Goals: Good ADL Goals Pt Will Perform Grooming: with contact guard assist;standing Pt Will Perform Upper Body Dressing: with set-up;sitting Pt Will Perform Lower Body Dressing: with min assist;sit to/from stand Pt Will Transfer to Toilet: with contact guard assist;ambulating;regular height toilet Pt/caregiver will Perform Home Exercise Program: Increased strength;Both right and left upper extremity;With theraband;With Supervision   OT Frequency:  Min 2X/week    Co-evaluation              AM-PAC OT 6 Clicks Daily Activity     Outcome Measure Help  from another person eating meals?: None Help from another person taking care of personal grooming?: A Little Help from another person toileting, which includes using toliet, bedpan, or urinal?: A Lot Help from another person bathing (including washing, rinsing, drying)?: A Lot Help from another person to put on and taking off regular upper body clothing?: A Lot Help from another person to put on and taking off regular lower body clothing?: A Lot 6 Click Score: 15   End of Session Equipment Utilized During Treatment: Rolling walker (2 wheels);Gait belt Nurse Communication: Mobility status  Activity Tolerance: Patient tolerated treatment well Patient left: in chair;with call bell/phone within reach;with chair alarm set  OT Visit Diagnosis: Unsteadiness on feet (R26.81);Muscle weakness (generalized) (M62.81);Pain Pain - Right/Left: Left Pain - part of body: Leg                Time: 1005-1030 OT Time Calculation (min): 25 min Charges:  OT General Charges $OT Visit: 1 Visit OT Evaluation $OT Eval Moderate Complexity: 1 Mod OT Treatments $Self Care/Home Management : 8-22 mins  11/18/2024  RP, OTR/L  Acute Rehabilitation Services  Office:  [phone]   Charlie JONETTA Halsted 11/18/2024, 10:37 AM

## 2024-11-19 ENCOUNTER — Inpatient Hospital Stay (HOSPITAL_COMMUNITY)

## 2024-11-19 DIAGNOSIS — A419 Sepsis, unspecified organism: Secondary | ICD-10-CM | POA: Diagnosis not present

## 2024-11-19 DIAGNOSIS — R6520 Severe sepsis without septic shock: Secondary | ICD-10-CM | POA: Diagnosis not present

## 2024-11-19 LAB — CBC WITH DIFFERENTIAL/PLATELET
Abs Immature Granulocytes: 0.17 K/uL — ABNORMAL HIGH (ref 0.00–0.07)
Basophils Absolute: 0 K/uL (ref 0.0–0.1)
Basophils Relative: 0 %
Eosinophils Absolute: 0.3 K/uL (ref 0.0–0.5)
Eosinophils Relative: 2 %
HCT: 29.1 % — ABNORMAL LOW (ref 39.0–52.0)
Hemoglobin: 8.9 g/dL — ABNORMAL LOW (ref 13.0–17.0)
Immature Granulocytes: 1 %
Lymphocytes Relative: 5 %
Lymphs Abs: 0.9 K/uL (ref 0.7–4.0)
MCH: 22.4 pg — ABNORMAL LOW (ref 26.0–34.0)
MCHC: 30.6 g/dL (ref 30.0–36.0)
MCV: 73.3 fL — ABNORMAL LOW (ref 80.0–100.0)
Monocytes Absolute: 2.4 K/uL — ABNORMAL HIGH (ref 0.1–1.0)
Monocytes Relative: 13 %
Neutro Abs: 14.8 K/uL — ABNORMAL HIGH (ref 1.7–7.7)
Neutrophils Relative %: 79 %
Platelets: 251 K/uL (ref 150–400)
RBC: 3.97 MIL/uL — ABNORMAL LOW (ref 4.22–5.81)
RDW: 16.4 % — ABNORMAL HIGH (ref 11.5–15.5)
WBC: 18.6 K/uL — ABNORMAL HIGH (ref 4.0–10.5)
nRBC: 0 % (ref 0.0–0.2)

## 2024-11-19 LAB — C-REACTIVE PROTEIN: CRP: 25.5 mg/dL — ABNORMAL HIGH

## 2024-11-19 LAB — LEGIONELLA PNEUMOPHILA SEROGP 1 UR AG: L. pneumophila Serogp 1 Ur Ag: NEGATIVE

## 2024-11-19 LAB — MAGNESIUM: Magnesium: 2.1 mg/dL (ref 1.7–2.4)

## 2024-11-19 LAB — BASIC METABOLIC PANEL WITH GFR
Anion gap: 9 (ref 5–15)
BUN: 62 mg/dL — ABNORMAL HIGH (ref 8–23)
CO2: 23 mmol/L (ref 22–32)
Calcium: 8.6 mg/dL — ABNORMAL LOW (ref 8.9–10.3)
Chloride: 109 mmol/L (ref 98–111)
Creatinine, Ser: 2.56 mg/dL — ABNORMAL HIGH (ref 0.61–1.24)
GFR, Estimated: 24 mL/min — ABNORMAL LOW
Glucose, Bld: 132 mg/dL — ABNORMAL HIGH (ref 70–99)
Potassium: 4 mmol/L (ref 3.5–5.1)
Sodium: 142 mmol/L (ref 135–145)

## 2024-11-19 LAB — PRO BRAIN NATRIURETIC PEPTIDE: Pro Brain Natriuretic Peptide: 1257 pg/mL — ABNORMAL HIGH

## 2024-11-19 LAB — PROCALCITONIN: Procalcitonin: 3.02 ng/mL

## 2024-11-19 LAB — PHOSPHORUS: Phosphorus: 3.6 mg/dL (ref 2.5–4.6)

## 2024-11-19 MED ORDER — SALINE SPRAY 0.65 % NA SOLN: 2.0000 | NASAL | Status: DC | PRN

## 2024-11-19 MED ORDER — SALINE SPRAY 0.65 % NA SOLN
2.0000 | NASAL | Status: AC
  Administered 2024-11-19 – 2024-11-20 (×3): 2 via NASAL
  Filled 2024-11-19: qty 44

## 2024-11-19 NOTE — Progress Notes (Signed)
 Physical Therapy Treatment Patient Details Name: Jerry [name]. 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    PT Comments  Pt has difficulty tolerating activity due to increased respiratory rate and decreased oxygen saturation. Pursed lip breathing reviewed with patient throughout session, improving O2 sats with rest. Pt requires min-modA for bed mobility and minA for trunk control due to posterior lean. Posterior lean persists during STS, requiring modA to achieve stance. Pt able to correct weight shift with cues and no posterior lean noted with short distance ambulation. Pt's chest does sink forward with fatigue, leading to mod-max UE support through walker. Ambulation kept to short distance at this time due to vitals. VS improved with rest and pt declines symptoms. Pt would benefit from continued PT services focused on bed mobility, strength, balance, transfers, and progressing gait to promote independence and safety with functional mobility.     If plan is discharge home, recommend the following: A lot of help with walking and/or transfers;A lot of help with bathing/dressing/bathroom;Assistance with cooking/housework;Direct supervision/assist for medications management;Direct supervision/assist for financial management;Assist for transportation;Help with stairs or ramp for entrance   Can travel by private vehicle     No  Equipment Recommendations  Rolling walker (2 wheels);BSC/3in1;Wheelchair (measurements PT);Wheelchair cushion (measurements PT)    Recommendations for Other Services       Precautions / Restrictions Precautions Precautions: Fall Recall of Precautions/Restrictions: Intact Restrictions Weight  Bearing Restrictions Per Provider Order: No     Mobility  Bed Mobility Overal bed mobility: Needs Assistance Bed Mobility: Supine to Sit     Supine to sit: Min assist, Mod assist     General bed mobility comments: Follows cues to sequence initial steps of bed mobility. LE and trunk assist required to complete transfer. Light guarding upon sitting EOB, posterior lean improved.    Transfers Overall transfer level: Needs assistance Equipment used: Rolling walker (2 wheels) Transfers: Sit to/from Stand, Bed to chair/wheelchair/BSC Sit to Stand: Mod assist   Step pivot transfers: Mod assist       General transfer comment: Assist required to initiate transfer and balance in standing due to posterior lean. Pt requires assist to correct posterior lean, sinks into foward posture with onset of fatigue. Cues for glute squeeze in standing to promote hip extension.    Ambulation/Gait Ambulation/Gait assistance: Min assist, Mod assist Gait Distance (Feet): 5 Feet (5'x2) Assistive device: Rolling walker (2 wheels) Gait Pattern/deviations: Step-to pattern, Decreased step length - right, Decreased step length - left, Narrow base of support, Trunk flexed, Knee flexed in stance - left, Knee flexed in stance - right   Gait velocity interpretation: <1.31 ft/sec, indicative of household ambulator   General Gait Details: MinA for short distance forward gait, modA required for backward with additional cuing for positioning. Cues for posture correction due to forward posture with fatigue. Elevated RR and decreased O2 with activity. Monitored in sitting and improved to functional limits.   Stairs             Wheelchair Mobility     Tilt Bed    Modified Rankin (Stroke Patients Only)       Balance Overall balance assessment: Needs assistance Sitting-balance support: Feet supported, Bilateral upper extremity supported Sitting balance-Leahy Scale:  Fair Sitting balance - Comments: Mild  posterior lean noted. Requires corrective guarding to maintain posture. Postural control: Posterior lean Standing balance support: Reliant on assistive device for balance, Bilateral upper extremity supported, During functional activity Standing balance-Leahy Scale: Fair Standing balance comment: Posterior lean able to be corrected, however, sinks into forward posture postion with onset of fatigue. Temporarily corrects with cues. Unsteady throughout due to tremors, present at baseline.                            Communication Communication Communication: Impaired Factors Affecting Communication: Difficulty expressing self  Cognition Arousal: Alert Behavior During Therapy: WFL for tasks assessed/performed   PT - Cognitive impairments: No apparent impairments                         Following commands: Intact Following commands impaired: Follows one step commands with increased time    Cueing Cueing Techniques: Verbal cues, Tactile cues, Visual cues  Exercises      General Comments General comments (skin integrity, edema, etc.): RR 30s with activity, SpO2 decreases to 86% improves with deep breathing and rest. No new skin abnormalities noted.      Pertinent Vitals/Pain Pain Assessment Pain Assessment: 0-10 Pain Score: 4  Pain Location: Back Pain Descriptors / Indicators: Aching, Sore Pain Intervention(s): Limited activity within patient's tolerance, Repositioned, Monitored during session    Home Living                          Prior Function            PT Goals (current goals can now be found in the care plan section) Acute Rehab PT Goals Patient Stated Goal: None stated Progress towards PT goals: Progressing toward goals    Frequency    Min 2X/week      PT Plan      Co-evaluation              AM-PAC PT 6 Clicks Mobility   Outcome Measure  Help needed turning from your back to your side while in a flat bed without  using bedrails?: A Little Help needed moving from lying on your back to sitting on the side of a flat bed without using bedrails?: A Lot Help needed moving to and from a bed to a chair (including a wheelchair)?: A Lot Help needed standing up from a chair using your arms (e.g., wheelchair or bedside chair)?: A Lot Help needed to walk in hospital room?: Total Help needed climbing 3-5 steps with a railing? : Total 6 Click Score: 11    End of Session Equipment Utilized During Treatment: Gait belt Activity Tolerance: Patient limited by fatigue (Pt limited by RR and SOB) Patient left: in chair;with call bell/phone within reach;with chair alarm set Nurse Communication: Mobility status PT Visit Diagnosis: Unsteadiness on feet (R26.81);Muscle weakness (generalized) (M62.81);Other abnormalities of gait and mobility (R26.89)     Time: 8891-8868 PT Time Calculation (min) (ACUTE ONLY): 23 min  Charges:    $Gait Training: 8-22 mins $Therapeutic Activity: 8-22 mins PT General Charges $$ ACUTE PT VISIT: 1 Visit                     Sabra Morel, PT, DPT  Acute Rehabilitation Services         Office: [phone]      Sabra MARLA Morel 11/19/2024, 3:05  PM

## 2024-11-19 NOTE — Plan of Care (Signed)
   Problem: Clinical Measurements: Goal: Cardiovascular complication will be avoided Outcome: Progressing   Problem: Activity: Goal: Risk for activity intolerance will decrease Outcome: Progressing

## 2024-11-19 NOTE — Progress Notes (Addendum)
 " Progress Note   Patient: Jerry [name]. 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  Patient was febrile on arrival to the ED, diagnosed with sepsis and started on IV antibiotics for infection, likely pulmonary source.   Subjective:  Afebrile overnight, no chest pain, reports dyspnea, reports cough much improved, he reports nasal dryness but he still feels stuffed asking for some spray    Assessment and Plan:  Sepsis due to community  acquired pneumonia in a patient with history of smoking in the past and evidence of bronchiectasis on imaging- on appropriate antibiotics, blood cultures negative thus far, MRSA nasal screen negative, sepsis pathophysiology is improving, he has been adequately hydrated, Sugars are pending, he spiked a temp of 101 early morning of 11/18/2024 despite being on Unasyn  and azithromycin , since he has underlying history of bronchiectasis I will broaden his coverage for Pseudomonas and he will now be placed on meropenem  along with azithromycin  and Zyvox  starting 11/18/2024.   - Have encouraged him to use incentive spirometer and flutter valve today and asked him to see if he can get out of bed to chair as long as possible today.  Continue with chest PT - High risk patient, CRP unfortunately continues to trend up up to 25 today, remains with significantly elevated procalcitonin and white blood cell count.    History of pulmonary embolism  - CT angio chest 02/15/2024 showed bilateral lower lobe PE, has IVC filter.  VQ scan this admission negative.  Acute kidney injury  superimposed on stage 3b chronic kidney disease (HCC) - patient has history of CKD3b. Baseline creat rose to 2, AKI due to sepsis improved after hydration.  Prediabetes - Last A1c 6.2% on 12/27/2023, Regular diet for now given poor p.o. intake.  PCP to monitor.  Vascular dementia (HCC) and parkinsonism- Continue Xanax  as needed Lexapro , Namenda , Zyprexa , at risk for delirium minimize benzodiazepines and narcotics.  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  History of Renal cysts in the past. CT scan done in the ER shows possible ruptured left renal cyst, no symptoms, stable renal ultrasound here.      Physical Exam: BP (!) 120/94 (BP Location: Left Arm)   Pulse 90   Temp 98.2 F (36.8 C) (Oral)   Resp 20   Ht 5' 11 (1.803 m)   Wt 83.9 kg   SpO2 100%   BMI 25.80 kg/m     Awake Alert, Oriented X 3, frail Symmetrical Chest wall movement, Good air movement bilaterally, Rales at the bases bilaterally RRR,No Gallops,Rubs or new Murmurs, No Parasternal Heave +ve B.Sounds, Abd Soft, No tenderness, No rebound - guarding or rigidity. No Cyanosis, Clubbing or edema, No new Rash or bruise       Data Review:   Patient Lines/Drains/Airways Status     Active Line/Drains/Airways     Name Placement date Placement time Site Days   Peripheral IV 11/15/24 20 G Anterior;Distal;Right Forearm 11/15/24  1221  Forearm  4   Urostomy Other (Comment) RLQ --  --  RLQ  --             DVT prophylaxis.  Lovenox .   Family communication.  Updated wife in detail on 11/18/2024 over the phone.    Inpatient Medications  Scheduled Meds:  (feeding supplement) PROSource Plus  30 mL Oral BID BM   amLODipine   10 mg Oral Daily   azithromycin   500 mg Oral Daily   enoxaparin  (LOVENOX ) injection  30 mg Subcutaneous Q24H   escitalopram    10 mg Oral Daily   fluticasone  furoate-vilanterol  1 puff Inhalation Daily   memantine   10 mg Oral BID   sodium chloride   2 spray Each Nare Q4H   sodium chloride  flush  3 mL Intravenous Q12H   Continuous Infusions:  linezolid  (ZYVOX ) IV 600 mg (11/19/24 1109)   meropenem  (MERREM ) IV 1 g (11/19/24 0620)   PRN Meds:.acetaminophen  **OR** acetaminophen , ALPRAZolam , hydrALAZINE , levalbuterol , ondansetron  **OR** ondansetron  (ZOFRAN ) IV, sodium chloride  **FOLLOWED BY** [START ON 11/20/2024] sodium chloride   DVT Prophylaxis  enoxaparin  (LOVENOX ) injection 30 mg Start: 11/16/24 1000   Recent Labs  Lab 11/15/24 1208 11/15/24 1232 11/15/24 1240 11/16/24 0305 11/17/24 0314 11/19/24 0339  WBC 24.0*  --   --  26.1* 19.2* 18.6*  HGB 10.5* 12.2* 12.2* 10.9* 9.3* 8.9*  HCT 36.6* 36.0* 36.0* 37.2* 31.4* 29.1*  PLT 235  --   --  229 230 251  MCV 77.7*  --   --  76.9* 75.5* 73.3*  MCH 22.3*  --   --  22.5* 22.4* 22.4*  MCHC 28.7*  --   --  29.3* 29.6* 30.6  RDW 16.0*  --   --  16.3* 16.0* 16.4*  LYMPHSABS 1.8  --   --  0.8  --  0.9  MONOABS 2.8*  --   --  0.8  --  2.4*  EOSABS 0.0  --   --  0.0  --  0.3  BASOSABS 0.1  --   --  0.0  --  0.0    Recent Labs  Lab 11/15/24 1208 11/15/24 1232 11/15/24 1233 11/15/24 1240 11/15/24 1434 11/15/24 1648 11/16/24 0305 11/16/24 1647 11/17/24 0314 11/17/24 0715 11/18/24 0315 11/19/24 0339  NA 138   < >  --  139  --   --  140  --  139  --  141 142  K 5.4*   < >  --  5.2*  --   --  5.6* 5.3* 5.0  --  4.6 4.0  CL 104  --   --  106  --   --  106  --  109  --  110 109  CO2 23  --   --   --   --   --  23  --  23  --  21* 23  ANIONGAP 11  --   --   --   --   --  11  --  7  --  10 9  GLUCOSE 111*  --   --  112*  --   --  151*  --  102*  --  104* 132*  BUN 57*  --   --  56*  --   --  55*  --  57*  --  57* 62*  CREATININE 2.60*  --   --  2.90*  --   --  2.34*  --  2.04*  --  2.22* 2.56*  AST  17  --   --   --   --   --   --   --   --   --   --   --    ALT 10  --   --   --   --   --   --   --   --   --   --   --   ALKPHOS 85  --   --   --   --   --   --   --   --   --   --   --   BILITOT 0.6  --   --   --   --   --   --   --   --   --   --   --   ALBUMIN  4.1  --   --   --   --   --   --   --   --   --   --   --   CRP  --   --   --   --   --   --   --   --   --  15.5* 21.8* 25.5*  DDIMER 4.61*  --   --   --   --   --   --   --   --   --   --   --   PROCALCITON  --   --   --   --   --  8.25 8.63  --   --   --  4.11 3.02  LATICACIDVEN  --   --  2.0*  --  1.0  --   --   --   --   --   --   --   INR 1.3*  --   --   --   --   --  1.4*  --   --   --   --   --   MG 2.0  --   --   --   --   --  2.2  --   --   --  2.0 2.1  PHOS  --   --   --   --   --   --   --   --   --   --  1.6* 3.6  CALCIUM  9.7  --   --   --   --   --  9.6  --  8.5*  --  8.8* 8.6*   < > = values in this interval not displayed.      Recent Labs  Lab 11/15/24 1208 11/15/24 1233 11/15/24 1434 11/15/24 1648 11/16/24 0305 11/17/24 0314 11/17/24 0715 11/18/24 0315 11/19/24 0339  CRP  --   --   --   --   --   --  15.5* 21.8* 25.5*  DDIMER 4.61*  --   --   --   --   --   --   --   --   PROCALCITON  --   --   --  8.25 8.63  --   --  4.11 3.02  LATICACIDVEN  --  2.0* 1.0  --   --   --   --   --   --   INR 1.3*  --   --   --  1.4*  --   --   --   --   MG 2.0  --   --   --  2.2  --   --  2.0 2.1  CALCIUM  9.7  --   --   --  9.6 8.5*  --  8.8* 8.6*    --------------------------------------------------------------------------------------------------------------- Lab Results  Component Value Date   CHOL 119 01/29/2024   HDL 61 01/29/2024   LDLCALC 39 01/29/2024   TRIG 94 01/29/2024   CHOLHDL 2.0 01/29/2024    Lab Results  Component Value Date   HGBA1C 6.2 (H) 12/27/2023   No results for input(s): TSH, T4TOTAL, FREET4, T3FREE, THYROIDAB in the last 72 hours. No results for input(s): VITAMINB12, FOLATE, FERRITIN, TIBC, IRON , RETICCTPCT in the last  72 hours. ------------------------------------------------------------------------------------------------------------------ Cardiac Enzymes No results for input(s): CKMB, TROPONINI, MYOGLOBIN in the last 168 hours.  Invalid input(s): CK  Micro Results Recent Results (from the past 240 hours)  Blood Culture (routine x 2)     Status: None (Preliminary result)   Collection Time: 11/15/24 12:08 PM   Specimen: BLOOD RIGHT HAND  Result Value Ref Range Status   Specimen Description BLOOD RIGHT HAND  Final   Special Requests   Final    BOTTLES DRAWN AEROBIC AND ANAEROBIC Blood Culture results may not be optimal due to an inadequate volume of blood received in culture bottles   Culture   Final    NO GROWTH 4 DAYS Performed at [facility], [address], [place], [place] [address]    Report Status PENDING  Incomplete  Blood Culture (routine x 2)     Status: None (Preliminary result)   Collection Time: 11/15/24 12:13 PM   Specimen: BLOOD LEFT HAND  Result Value Ref Range Status   Specimen Description BLOOD LEFT HAND  Final   Special Requests   Final    BOTTLES DRAWN AEROBIC AND ANAEROBIC Blood Culture adequate volume   Culture   Final    NO GROWTH 4 DAYS Performed at [facility], [address] [place], [place] [address]    Report Status PENDING  Incomplete  Resp panel by RT-PCR (RSV, Flu A&B, Covid) Anterior Nasal Swab     Status: None   Collection Time: 11/15/24 12:46 PM   Specimen: Anterior Nasal Swab  Result Value Ref Range Status   SARS Coronavirus 2 by RT PCR NEGATIVE NEGATIVE Final   Influenza A by PCR NEGATIVE NEGATIVE Final   Influenza B by PCR NEGATIVE NEGATIVE Final    Comment: (NOTE) The Xpert Xpress SARS-CoV-2/FLU/RSV plus assay is intended as an aid in the diagnosis of influenza from Nasopharyngeal swab specimens and should not be used as a sole basis for treatment. Nasal washings and aspirates are unacceptable for Xpert Xpress  SARS-CoV-2/FLU/RSV testing.  Fact Sheet for Patients: bloggercourse.com  Fact Sheet for Healthcare Providers: seriousbroker.it  This test is not yet approved or cleared by the [place]  FDA and has been authorized for detection and/or diagnosis of SARS-CoV-2 by FDA under an Emergency Use Authorization (EUA). This EUA will remain in effect (meaning this test can be used) for the duration of the COVID-19 declaration under Section 564(b)(1) of the Act, 21 U.S.C. section 360bbb-3(b)(1), unless the authorization is terminated or revoked.     Resp Syncytial Virus by PCR NEGATIVE NEGATIVE Final    Comment: (NOTE) Fact Sheet for Patients: bloggercourse.com  Fact Sheet for Healthcare Providers: seriousbroker.it  This test is not yet approved or cleared by the [place]  FDA and has been authorized for detection and/or diagnosis of SARS-CoV-2 by FDA under an Emergency Use Authorization (EUA). This  EUA will remain in effect (meaning this test can be used) for the duration of the COVID-19 declaration under Section 564(b)(1) of the Act, 21 U.S.C. section 360bbb-3(b)(1), unless the authorization is terminated or revoked.  Performed at [facility], [address], [place], [place] [address]   Respiratory (~20 pathogens) panel by PCR     Status: None   Collection Time: 11/15/24 12:46 PM   Specimen: Nasopharyngeal Swab; Respiratory  Result Value Ref Range Status   Adenovirus NOT DETECTED NOT DETECTED Final   Coronavirus 229E NOT DETECTED NOT DETECTED Final    Comment: (NOTE) The Coronavirus on the Respiratory Panel, DOES NOT test for the novel  Coronavirus (2019 nCoV)    Coronavirus HKU1 NOT DETECTED NOT DETECTED Final   Coronavirus NL63 NOT DETECTED NOT DETECTED Final   Coronavirus OC43 NOT DETECTED NOT DETECTED Final   Metapneumovirus NOT DETECTED NOT DETECTED Final    Rhinovirus / Enterovirus NOT DETECTED NOT DETECTED Final   Influenza A NOT DETECTED NOT DETECTED Final   Influenza B NOT DETECTED NOT DETECTED Final   Parainfluenza Virus 1 NOT DETECTED NOT DETECTED Final   Parainfluenza Virus 2 NOT DETECTED NOT DETECTED Final   Parainfluenza Virus 3 NOT DETECTED NOT DETECTED Final   Parainfluenza Virus 4 NOT DETECTED NOT DETECTED Final   Respiratory Syncytial Virus NOT DETECTED NOT DETECTED Final   Bordetella pertussis NOT DETECTED NOT DETECTED Final   Bordetella Parapertussis NOT DETECTED NOT DETECTED Final   Chlamydophila pneumoniae NOT DETECTED NOT DETECTED Final   Mycoplasma pneumoniae NOT DETECTED NOT DETECTED Final    Comment: Performed at [facility], [address] [place], [place] [address]  MRSA Next Gen by PCR, Nasal     Status: None   Collection Time: 11/16/24 12:40 PM   Specimen: Nasal Mucosa; Nasal Swab  Result Value Ref Range Status   MRSA by PCR Next Gen NOT DETECTED NOT DETECTED Final    Comment: (NOTE) The GeneXpert MRSA Assay (FDA approved for NASAL specimens only), is one component of a comprehensive MRSA colonization surveillance program. It is not intended to diagnose MRSA infection nor to guide or monitor treatment for MRSA infections. Test performance is not FDA approved in patients less than 72 years old. Performed at [facility], [address] [place], [place] [address]   Respiratory (~20 pathogens) panel by PCR     Status: None   Collection Time: 11/18/24  5:28 AM   Specimen: Nasopharyngeal Swab; Respiratory  Result Value Ref Range Status   Adenovirus NOT DETECTED NOT DETECTED Final   Coronavirus 229E NOT DETECTED NOT DETECTED Final    Comment: (NOTE) The Coronavirus on the Respiratory Panel, DOES NOT test for the novel  Coronavirus (2019 nCoV)    Coronavirus HKU1 NOT DETECTED NOT DETECTED Final   Coronavirus NL63 NOT DETECTED NOT DETECTED Final   Coronavirus OC43 NOT DETECTED NOT DETECTED Final    Metapneumovirus NOT DETECTED NOT DETECTED Final   Rhinovirus / Enterovirus NOT DETECTED NOT DETECTED Final   Influenza A NOT DETECTED NOT DETECTED Final   Influenza B NOT DETECTED NOT DETECTED Final   Parainfluenza Virus 1 NOT DETECTED NOT DETECTED Final   Parainfluenza Virus 2 NOT DETECTED NOT DETECTED Final   Parainfluenza Virus 3 NOT DETECTED NOT DETECTED Final   Parainfluenza Virus 4 NOT DETECTED NOT DETECTED Final   Respiratory Syncytial Virus NOT DETECTED NOT DETECTED Final   Bordetella pertussis NOT DETECTED NOT DETECTED Final   Bordetella  Parapertussis NOT DETECTED NOT DETECTED Final   Chlamydophila pneumoniae NOT DETECTED NOT DETECTED Final   Mycoplasma pneumoniae NOT DETECTED NOT DETECTED Final    Comment: Performed at [facility], [address], [place], [place] [address]    Radiology Reports  DG Swallowing [facility] Pathology Result Date: 11/19/2024 Table formatting from the original result was not included. Modified Barium Swallow Study Patient Details Name: Jerry [name]. MRN: [id number] Date of Birth: 10/13/1941 Today's Date: 11/19/2024 HPI/PMH: HPI: Mr. Panek is an 84 yo presenting with weakness and lethargy. Admitted with severe sepsis likely from PNA with CT Chest showing L > R infiltrates and bronchiectasis. Previous swallow eval in March 2025 with normal appearing swallow. PMH includes: parkinsonism, dementia, prostate ca, CKDIII, HTN, HLD Clinical Impression: Pt presents with overall functional oropharyngeal swallowing. There is trace oropharyngeal residue that is managed with a subswallow. Laryngeal closure is complete with no penetration/aspiration occurring across all trials. He took the 13 mm barium tablet with thin liquids, which progressed through the pharynx and esophagus quickly, though he does endorse coughing when eating vegetables recently that may be representative of an esophageal component (could consider dedicated assessment). Continue current diet  without ongoing SLP f/u. Factors that may increase risk of adverse event in presence of aspiration Noe & Lianne 2021): Factors that may increase risk of adverse event in presence of aspiration Noe & Lianne 2021): Reduced cognitive function; Limited mobility Recommendations/Plan: Swallowing Evaluation Recommendations Swallowing Evaluation Recommendations Recommendations: PO diet PO Diet Recommendation: Regular; Thin liquids (Level 0) Liquid Administration via: Cup; Straw Medication Administration: Whole meds with liquid Supervision: Staff to assist with self-feeding; Intermittent supervision/cueing for swallowing strategies Swallowing strategies  : Minimize environmental distractions; Slow rate; Small bites/sips Postural changes: Position pt fully upright for meals Oral care recommendations: Oral care BID (2x/day) Treatment Plan Treatment Plan Treatment recommendations: No treatment recommended at this time Follow-up recommendations: No SLP follow up Functional status assessment: Patient has had a recent decline in their functional status and demonstrates the ability to make significant improvements in function in a reasonable and predictable amount of time. Recommendations Recommendations for follow up therapy are one component of a multi-disciplinary discharge planning process, led by the attending physician.  Recommendations may be updated based on patient status, additional functional criteria and insurance authorization. Assessment: Orofacial Exam: Orofacial Exam Oral Cavity: Oral Hygiene: WFL Oral Cavity - Dentition: Adequate natural dentition Orofacial Anatomy: WFL Oral Motor/Sensory Function: WFL Anatomy: Anatomy: Suspected cervical osteophytes Boluses Administered: Boluses Administered Boluses Administered: Thin liquids (Level 0); Mildly thick liquids (Level 2, nectar thick); Moderately thick liquids (Level 3, honey thick); Puree; Solid  Oral Impairment Domain: Oral Impairment Domain Lip Closure:  No labial escape Tongue control during bolus hold: Cohesive bolus between tongue to palatal seal Bolus preparation/mastication: Timely and efficient chewing and mashing Bolus transport/lingual motion: Brisk tongue motion Oral residue: Complete oral clearance Location of oral residue : N/A Initiation of pharyngeal swallow : Pyriform sinuses  Pharyngeal Impairment Domain: Pharyngeal Impairment Domain Soft palate elevation: No bolus between soft palate (SP)/pharyngeal wall (PW) Laryngeal elevation: Complete superior movement of thyroid  cartilage with complete approximation of arytenoids to epiglottic petiole Anterior hyoid excursion: Complete anterior movement Epiglottic movement: Complete inversion Laryngeal vestibule closure: Complete, no air/contrast in laryngeal vestibule Pharyngeal stripping wave : Present - complete Pharyngeal contraction (A/P view only): N/A Pharyngoesophageal segment opening: Complete distension and complete duration, no obstruction of flow Tongue base retraction: No contrast between tongue base and posterior pharyngeal wall (PPW)  Pharyngeal residue: Trace residue within or on pharyngeal structures Location of pharyngeal residue: Tongue base; Valleculae  Esophageal Impairment Domain: Esophageal Impairment Domain Esophageal clearance upright position: Complete clearance, esophageal coating Pill: Pill Consistency administered: Thin liquids (Level 0) Thin liquids (Level 0): [facility] Penetration/Aspiration Scale Score: Penetration/Aspiration Scale Score 1.  Material does not enter airway: Thin liquids (Level 0); Mildly thick liquids (Level 2, nectar thick); Moderately thick liquids (Level 3, honey thick); Puree; Solid; Pill Compensatory Strategies: Compensatory Strategies Compensatory strategies: No   General Information: Caregiver present: No  Diet Prior to this Study: Regular; Thin liquids (Level 0)   Temperature : Normal   Respiratory Status: WFL   Supplemental O2: None (Room air)   History of Recent  Intubation: No  Behavior/Cognition: Alert; Pleasant mood; Cooperative Self-Feeding Abilities: Able to self-feed Baseline vocal quality/speech: Normal Volitional Cough: Able to elicit Volitional Swallow: Able to elicit Exam Limitations: No limitations Goal Planning: Prognosis for improved oropharyngeal function: Good Barriers to Reach Goals: Cognitive deficits No data recorded Patient/Family Stated Goal: none stated Consulted and agree with results and recommendations: Patient; Physician Pain: Pain Assessment Pain Assessment: No/denies pain Faces Pain Scale: 4 Pain Location: R leg Pain Descriptors / Indicators: Sore Pain Intervention(s): Monitored during session End of Session: Start Time:SLP Start Time (ACUTE ONLY): 0955 Stop Time: SLP Stop Time (ACUTE ONLY): 1012 Time Calculation:SLP Time Calculation (min) (ACUTE ONLY): 17 min Charges: SLP Evaluations $ SLP Speech Visit: 1 Visit SLP Evaluations $MBS Swallow: 1 Procedure $Swallowing Treatment: 1 Procedure SLP visit diagnosis: SLP Visit Diagnosis: Dysphagia, oropharyngeal phase (R13.12) Past Medical History: Past Medical History: Diagnosis Date  Allergic rhinitis 07/21/2021  Anemia 04/17/2015  Arthritis   Asthma   Asthma, mild intermittent   Benign essential hypertension 07/21/2021  Bladder cancer (HCC) 2019  CAP (community acquired pneumonia) 06/26/2023  Degenerative cervical spinal stenosis 07/21/2021  Faintness   History of malignant neoplasm of bladder 07/21/2021  Hyperglycemia 04/17/2015  Hypertension   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  Kidney stone   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  Malnutrition of moderate degree 06/28/2023  Mixed hyperlipidemia 07/21/2021  OSA (obstructive sleep apnea) 07/12/2021  NPSG- 03/16/21- AHI 10.3/ hr, desaturation to 87%, body weight 190 lbs    Renal insufficiency 04/17/2015  Syncope 04/17/2015  Tremor 07/21/2021  Vascular dementia (HCC)   Vitamin D  deficiency 07/21/2021 Past Surgical History: Past Surgical History: Procedure  Laterality Date  BACK SURGERY    BIOPSY  10/09/2023  Procedure: BIOPSY;  Surgeon: Elicia Claw, MD;  Location: [facility] ENDOSCOPY;  Service: Gastroenterology;;  CHOLECYSTECTOMY    COLONOSCOPY WITH PROPOFOL  Bilateral 10/09/2023  Procedure: COLONOSCOPY WITH PROPOFOL ;  Surgeon: Elicia Claw, MD;  Location: [facility];  Service: Gastroenterology;  Laterality: Bilateral;  ESOPHAGOGASTRODUODENOSCOPY N/A 02/22/2024  Procedure: EGD (ESOPHAGOGASTRODUODENOSCOPY);  Surgeon: Elicia Claw, MD;  Location: [facility] ENDOSCOPY;  Service: Gastroenterology;  Laterality: N/A;  ESOPHAGOGASTRODUODENOSCOPY (EGD) WITH PROPOFOL  Bilateral 10/09/2023  Procedure: ESOPHAGOGASTRODUODENOSCOPY (EGD) WITH PROPOFOL ;  Surgeon: Elicia Claw, MD;  Location: [facility]OS[facility];  Service: Gastroenterology;  Laterality: Bilateral;  HOT HEMOSTASIS N/A 10/09/2023  Procedure: HOT HEMOSTASIS (ARGON PLASMA COAGULATION/BICAP);  Surgeon: Elicia Claw, MD;  Location: [facility];  Service: Gastroenterology;  Laterality: N/A;  KNEE SURGERY Left   LOWER EXTREMITY VENOGRAPHY  01/29/2024  Procedure: LOWER EXTREMITY VENOGRAPHY;  Surgeon: Jordan, Peter M, MD;  Location: [facility] INVASIVE CV LAB;  Service: Cardiovascular;;  RIGHT/LEFT HEART CATH AND CORONARY ANGIOGRAPHY N/A 01/29/2024  Procedure: RIGHT/LEFT HEART CATH AND CORONARY ANGIOGRAPHY;  Surgeon: Jordan, Peter M, MD;  Location: [facility] INVASIVE CV  LAB;  Service: Cardiovascular;  Laterality: N/A;  SHOULDER SURGERY    for rotator cuff tear  VENA CAVA UMBRELLA NECK APPROACH Right 02/22/2024  Procedure: INSERTION, UMBRELLA FILTER, INFERIOR VENA CAVA;  Surgeon: Sheree Penne Bruckner, MD;  Location: [facility] OR;  Service: Vascular;  Laterality: Right; Damien Blumenthal, M.A., CCC-SLP Speech Language Pathology, Acute Rehabilitation Services Secure Chat preferred [phone] 11/19/2024, 11:39 AM  DG CHEST PORT 1 VIEW Result Date: 11/18/2024 EXAM: 1 VIEW(S) XRAY OF THE CHEST 11/18/2024 09:30:00 PM COMPARISON: Portable chest today at  7:04 am. CLINICAL HISTORY: SOB (shortness of breath) FINDINGS: LUNGS AND PLEURA: Consolidation continues to be seen in the left lower lung field. There is a small left pleural effusion, presumably parapneumonic, and a minimal right pleural effusion. Overall aeration seems unchanged. No pneumothorax. HEART AND MEDIASTINUM: Mild cardiomegaly is seen with mild central vascular prominence without visible edema. The mediastinum is normally outlined. BONES AND SOFT TISSUES: Degenerative change and mild dextroscoliosis of the thoracic spine. IMPRESSION: 1. Consolidation in the left lower lung field with a small left pleural effusion, stable overall aeration. 2. Minimal right pleural effusion. 3. Mild cardiomegaly with mild central vascular prominence without visible edema. Electronically signed by: Francis Quam MD 11/18/2024 09:38 PM EST RP Workstation: [id number]   DG Chest Port 1 View Result Date: 11/18/2024 CLINICAL DATA:  Shortness of breath. EXAM: PORTABLE CHEST 1 VIEW COMPARISON:  11/17/2024 FINDINGS: Patchy airspace disease noted in the left lung base, similar to prior. Small left pleural effusion. Right lung remains clear. Cardiopericardial silhouette is at upper limits of normal for size. No acute bony abnormality. IMPRESSION: Patchy airspace disease at the left base with small left pleural effusion, similar to prior. Electronically Signed   By: Camellia Candle M.D.   On: 11/18/2024 07:32      Signature  -   Brayton Lye M.D on 11/19/2024 at 3:48 PM   -  To page go to [email or web address]        "

## 2024-11-19 NOTE — TOC Progression Note (Signed)
 Transition of Care (TOC) - Progression Note    Patient Details  Name: Jerry [name]. MRN: [id number] Date of Birth: [birth year]  Transition of Care [facility]) CM/SW Contact  Inocente GORMAN Kindle, LCSW Phone Number: 11/19/2024, 3:26 PM  Clinical Narrative:    12pm-CSW met with patient who stated his wife had briefly mentioned Emmalene but that he would leave it up to her.  CSW spoke with patient's wife who stated she is going to tour [employer].    Expected Discharge Plan: Skilled Nursing Facility Barriers to Discharge: Continued Medical Work up, SNF Pending bed offer               Expected Discharge Plan and Services In-house Referral: Clinical Social Work   Post Acute Care Choice: Skilled Nursing Facility Living arrangements for the past 2 months: Single Family Home                                       Social Drivers of Health (SDOH) Interventions SDOH Screenings   Food Insecurity: No Food Insecurity (11/16/2024)  Housing: Low Risk (11/16/2024)  Transportation Needs: No Transportation Needs (11/16/2024)  Utilities: Not At Risk (11/16/2024)  Depression (PHQ2-9): Low Risk (04/14/2024)  Social Connections: Moderately Isolated (11/16/2024)  Tobacco Use: Low Risk (11/15/2024)    Readmission Risk Interventions    11/18/2024    3:02 PM 02/18/2024    2:20 PM 06/27/2023   12:12 PM  Readmission Risk Prevention Plan  Transportation Screening Complete Complete Complete  PCP or Specialist Appt within 5-7 Days   Complete  PCP or Specialist Appt within 3-5 Days  Complete   Home Care Screening   Complete  Medication Review (RN CM)   Complete  HRI or Home Care Consult  Complete   Palliative Care Screening  Not Applicable   Medication Review (RN Care Manager) Complete Complete   PCP or Specialist appointment within 3-5 days of discharge Complete    HRI or Home Care Consult Complete    SW Recovery Care/Counseling Consult Complete    Palliative Care Screening Not Applicable    Skilled  Nursing Facility Complete

## 2024-11-19 NOTE — Progress Notes (Signed)
 Modified Barium Swallow Study  Patient Details  Name: Jerry [name]. MRN: [id number] Date of Birth: 03-May-1941  Today's Date: 11/19/2024  Modified Barium Swallow completed.  Full report located under Chart Review in the Imaging Section.  History of Present Illness Mr. Wrightson is an 84 yo presenting with weakness and lethargy. Admitted with severe sepsis likely from PNA with CT Chest showing L > R infiltrates and bronchiectasis. Previous swallow eval in March 2025 with normal appearing swallow. PMH includes: parkinsonism, dementia, prostate ca, CKDIII, HTN, HLD   Clinical Impression Pt presents with overall functional oropharyngeal swallowing. There is trace oropharyngeal residue that is managed with a subswallow. Laryngeal closure is complete with no penetration/aspiration occurring across all trials. He took the 13 mm barium tablet with thin liquids, which progressed through the pharynx and esophagus quickly, though he does endorse coughing when eating vegetables recently that may be representative of an esophageal component (could consider dedicated assessment). Continue current diet without ongoing SLP f/u.  Factors that may increase risk of adverse event in presence of aspiration Noe & Lianne 2021): Reduced cognitive function;Limited mobility  Swallow Evaluation Recommendations Recommendations: PO diet PO Diet Recommendation: Regular;Thin liquids (Level 0) Liquid Administration via: Cup;Straw Medication Administration: Whole meds with liquid Supervision: Staff to assist with self-feeding;Intermittent supervision/cueing for swallowing strategies Swallowing strategies  : Minimize environmental distractions;Slow rate;Small bites/sips Postural changes: Position pt fully upright for meals Oral care recommendations: Oral care BID (2x/day)   Damien Blumenthal, M.A., CCC-SLP Speech Language Pathology, Acute Rehabilitation Services  Secure Chat preferred [phone]  11/19/2024,11:32 AM

## 2024-11-20 ENCOUNTER — Inpatient Hospital Stay (HOSPITAL_COMMUNITY)

## 2024-11-20 DIAGNOSIS — A419 Sepsis, unspecified organism: Secondary | ICD-10-CM | POA: Diagnosis not present

## 2024-11-20 DIAGNOSIS — R6520 Severe sepsis without septic shock: Secondary | ICD-10-CM | POA: Diagnosis not present

## 2024-11-20 LAB — CULTURE, BLOOD (ROUTINE X 2)
Culture: NO GROWTH
Culture: NO GROWTH
Special Requests: ADEQUATE

## 2024-11-20 LAB — PHOSPHORUS: Phosphorus: 2.6 mg/dL (ref 2.5–4.6)

## 2024-11-20 LAB — BASIC METABOLIC PANEL WITH GFR
Anion gap: 9 (ref 5–15)
BUN: 61 mg/dL — ABNORMAL HIGH (ref 8–23)
CO2: 23 mmol/L (ref 22–32)
Calcium: 8.8 mg/dL — ABNORMAL LOW (ref 8.9–10.3)
Chloride: 110 mmol/L (ref 98–111)
Creatinine, Ser: 2.6 mg/dL — ABNORMAL HIGH (ref 0.61–1.24)
GFR, Estimated: 24 mL/min — ABNORMAL LOW
Glucose, Bld: 110 mg/dL — ABNORMAL HIGH (ref 70–99)
Potassium: 4.2 mmol/L (ref 3.5–5.1)
Sodium: 142 mmol/L (ref 135–145)

## 2024-11-20 LAB — PROCALCITONIN: Procalcitonin: 1.94 ng/mL

## 2024-11-20 LAB — MAGNESIUM: Magnesium: 2 mg/dL (ref 1.7–2.4)

## 2024-11-20 LAB — C-REACTIVE PROTEIN: CRP: 22.4 mg/dL — ABNORMAL HIGH

## 2024-11-20 LAB — PRO BRAIN NATRIURETIC PEPTIDE: Pro Brain Natriuretic Peptide: 994 pg/mL — ABNORMAL HIGH

## 2024-11-20 NOTE — Plan of Care (Signed)
   Problem: Education: Goal: Knowledge of General Education information will improve Description: Including pain rating scale, medication(s)/side effects and non-pharmacologic comfort measures Outcome: Progressing   Problem: Pain Managment: Goal: General experience of comfort will improve and/or be controlled Outcome: Progressing   Problem: Safety: Goal: Ability to remain free from injury will improve Outcome: Progressing

## 2024-11-20 NOTE — Progress Notes (Signed)
" °   11/19/24 2155  Assess: MEWS Score  Temp (!) 102.6 F (39.2 C)  BP 139/72  MAP (mmHg) 91  Pulse Rate (!) 111  ECG Heart Rate (!) 112  Resp (!) 22  SpO2 100 %  O2 Device Room Air  Assess: MEWS Score  MEWS Temp 2  MEWS Systolic 0  MEWS Pulse 2  MEWS RR 1  MEWS LOC 0  MEWS Score 5  MEWS Score Color Red  Assess: if the MEWS score is Yellow or Red  Were vital signs accurate and taken at a resting state? Yes  Does the patient meet 2 or more of the SIRS criteria? Yes  Does the patient have a confirmed or suspected source of infection? Yes  MEWS guidelines implemented  Yes, red  Treat  MEWS Interventions Considered administering scheduled or prn medications/treatments as ordered  Take Vital Signs  Increase Vital Sign Frequency  Red: Q1hr x2, continue Q4hrs until patient remains green for 12hrs  Escalate  MEWS: Escalate Red: Discuss with charge nurse and notify provider. Consider notifying RRT. If remains red for 2 hours consider need for higher level of care  Notify: Charge Nurse/RN  Name of Charge Nurse/RN Notified Dawn RN  Provider Notification  Provider Name/Title Dr. Charlton  Date Provider Notified 11/19/24  Time Provider Notified 2200  Method of Notification Page (secured chat)  Notification Reason Other (Comment) (red mews)  Provider response No new orders  Date of Provider Response 11/19/24  Time of Provider Response 2210  Assess: SIRS CRITERIA  SIRS Temperature  1  SIRS Respirations  1  SIRS Pulse 1  SIRS WBC 0  SIRS Score Sum  3    "

## 2024-11-20 NOTE — Plan of Care (Signed)

## 2024-11-20 NOTE — Progress Notes (Signed)
 " Progress Note   Patient: Jerry [name]. FMW:[id number] DOB: November 30, 1940 DOA: 11/15/2024     5 DOS: the patient was seen and examined on 11/20/2024    Brief hospital course:  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  Patient was febrile on arrival to the ED, diagnosed with sepsis and started on IV antibiotics for infection, likely pulmonary source.   Subjective:  Fever 102.6 overnight, reports generalized weakness and fatigue this morning, denies any abdominal pain    Assessment and Plan:  Sepsis due to community  acquired pneumonia in a patient with history of smoking in the past and evidence of bronchiectasis on imaging- on appropriate antibiotics, blood cultures negative thus far, MRSA nasal screen negative, sepsis pathophysiology is improving, he has been adequately hydrated, Sugars are pending, he spiked a temp of 101 early morning of 11/18/2024 despite being on Unasyn  and azithromycin , since he has underlying history of bronchiectasis I will broaden his coverage for Pseudomonas and he will now be placed on meropenem  along with azithromycin  and Zyvox  starting 11/18/2024.   - Have encouraged him to use incentive spirometer and flutter valve today and asked him to see if he can get out of bed to chair as long as possible today.  Continue with chest PT - High risk patient, CRP unfortunately continues to trend up up to 25 today, remains with significantly elevated procalcitonin and white blood cell count.  Left renal fluid collection (cyst versus abscess) - Patient remains febrile, despite being on appropriate antibiotic coverage, with significant leukocytosis, elevated CRP, imaging on admission with left renal fluid  collection concerning for cyst versus abscess, discussed with urology, they recommend repeat imaging (unfortunately has to be done without IV contrast due to AKI to evaluate if this is abscess or ruptured cyst may be contributing to his persistent fever  History of pulmonary embolism  - CT angio chest 02/15/2024 showed bilateral lower lobe PE, has IVC filter.  VQ scan this admission negative.  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) - patient has history of CKD3b. Baseline creat rose to 2, AKI due to sepsis improved after hydration.  Prediabetes - Last A1c 6.2% on 12/27/2023, Regular diet for now given poor p.o. intake.  PCP to monitor.  Vascular dementia (HCC) and parkinsonism- Continue Xanax  as needed Lexapro , Namenda , Zyprexa , at risk for delirium minimize benzodiazepines and narcotics.  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        Physical Exam: BP 127/82 (BP Location: Left Arm)   Pulse 99   Temp 100.3 F (37.9 C) (Axillary)   Resp 20   Ht 5' 11 (1.803 m)   Wt 83.9 kg   SpO2 98%   BMI 25.80 kg/m     Awake Alert, Oriented X 3, frail chronically ill appearing Good air entry bilaterally with rales at the bases RRR,No Gallops,Rubs or new Murmurs, No Parasternal Heave +ve B.Sounds, Abd Soft, No tenderness, no CVA tenderness, urostomy present No Cyanosis, Clubbing or edema, No new Rash or bruise       Data Review:   Patient  Lines/Drains/Airways Status     Active Line/Drains/Airways     Name Placement date Placement time Site Days   Peripheral IV 11/20/24 22 G 1.75 Anterior;Left;Proximal Forearm 11/20/24  1453  Forearm  less than 1   Urostomy Other (Comment) RLQ --  --  RLQ  --             DVT prophylaxis.  Lovenox .   Family communication.  Updated wife in  detail on 11/18/2024 over the phone.    Inpatient Medications  Scheduled Meds:  (feeding supplement) PROSource Plus  30 mL Oral BID BM   amLODipine   10 mg Oral Daily   azithromycin   500 mg Oral Daily   enoxaparin  (LOVENOX ) injection  30 mg Subcutaneous Q24H   escitalopram   10 mg Oral Daily   fluticasone  furoate-vilanterol  1 puff Inhalation Daily   memantine   10 mg Oral BID   sodium chloride   2 spray Each Nare Q4H   sodium chloride  flush  3 mL Intravenous Q12H   Continuous Infusions:  linezolid  (ZYVOX ) IV 600 mg (11/20/24 1015)   meropenem  (MERREM ) IV Stopped (11/20/24 0556)   PRN Meds:.acetaminophen  **OR** acetaminophen , ALPRAZolam , hydrALAZINE , levalbuterol , ondansetron  **OR** ondansetron  (ZOFRAN ) IV, sodium chloride  **FOLLOWED BY** sodium chloride   DVT Prophylaxis  enoxaparin  (LOVENOX ) injection 30 mg Start: 11/16/24 1000   Recent Labs  Lab 11/15/24 1208 11/15/24 1232 11/15/24 1240 11/16/24 0305 11/17/24 0314 11/19/24 0339  WBC 24.0*  --   --  26.1* 19.2* 18.6*  HGB 10.5* 12.2* 12.2* 10.9* 9.3* 8.9*  HCT 36.6* 36.0* 36.0* 37.2* 31.4* 29.1*  PLT 235  --   --  229 230 251  MCV 77.7*  --   --  76.9* 75.5* 73.3*  MCH 22.3*  --   --  22.5* 22.4* 22.4*  MCHC 28.7*  --   --  29.3* 29.6* 30.6  RDW 16.0*  --   --  16.3* 16.0* 16.4*  LYMPHSABS 1.8  --   --  0.8  --  0.9  MONOABS 2.8*  --   --  0.8  --  2.4*  EOSABS 0.0  --   --  0.0  --  0.3  BASOSABS 0.1  --   --  0.0  --  0.0    Recent Labs  Lab 11/15/24 1208 11/15/24 1232 11/15/24 1233 11/15/24 1240 11/15/24 1434 11/15/24 1648 11/16/24 0305 11/16/24 1647 11/17/24 0314 11/17/24 0715 11/18/24 0315 11/19/24 0339 11/20/24 0404  NA 138   < >  --    < >  --   --  140  --  139  --  141 142 142  K 5.4*   < >  --    < >  --   --  5.6* 5.3* 5.0  --  4.6 4.0 4.2  CL 104  --   --    < >  --   --  106  --  109  --  110 109 110  CO2 23  --   --   --   --   --  23  --  23  --  21* 23 23  ANIONGAP 11  --   --   --   --    --  11  --  7  --  10 9 9   GLUCOSE 111*  --   --    < >  --   --  151*  --  102*  --  104* 132* 110*  BUN  57*  --   --    < >  --   --  55*  --  57*  --  57* 62* 61*  CREATININE 2.60*  --   --    < >  --   --  2.34*  --  2.04*  --  2.22* 2.56* 2.60*  AST 17  --   --   --   --   --   --   --   --   --   --   --   --   ALT 10  --   --   --   --   --   --   --   --   --   --   --   --   ALKPHOS 85  --   --   --   --   --   --   --   --   --   --   --   --   BILITOT 0.6  --   --   --   --   --   --   --   --   --   --   --   --   ALBUMIN  4.1  --   --   --   --   --   --   --   --   --   --   --   --   CRP  --   --   --   --   --   --   --   --   --  15.5* 21.8* 25.5* 22.4*  DDIMER 4.61*  --   --   --   --   --   --   --   --   --   --   --   --   PROCALCITON  --   --   --   --   --  8.25 8.63  --   --   --  4.11 3.02 1.94  LATICACIDVEN  --   --  2.0*  --  1.0  --   --   --   --   --   --   --   --   INR 1.3*  --   --   --   --   --  1.4*  --   --   --   --   --   --   MG 2.0  --   --   --   --   --  2.2  --   --   --  2.0 2.1 2.0  PHOS  --   --   --   --   --   --   --   --   --   --  1.6* 3.6 2.6  CALCIUM  9.7  --   --   --   --   --  9.6  --  8.5*  --  8.8* 8.6* 8.8*   < > = values in this interval not displayed.      Recent Labs  Lab 11/15/24 1208 11/15/24 1233 11/15/24 1434 11/15/24 1648 11/16/24 0305 11/17/24 0314 11/17/24 0715 11/18/24 0315 11/19/24 0339 11/20/24 0404  CRP  --   --   --   --   --   --  15.5* 21.8* 25.5* 22.4*  DDIMER 4.61*  --   --   --   --   --   --   --   --   --  PROCALCITON  --   --   --  8.25 8.63  --   --  4.11 3.02 1.94  LATICACIDVEN  --  2.0* 1.0  --   --   --   --   --   --   --   INR 1.3*  --   --   --  1.4*  --   --   --   --   --   MG 2.0  --   --   --  2.2  --   --  2.0 2.1 2.0  CALCIUM  9.7  --   --   --  9.6 8.5*  --  8.8* 8.6* 8.8*     --------------------------------------------------------------------------------------------------------------- Lab Results  Component Value Date   CHOL 119 01/29/2024   HDL 61 01/29/2024   LDLCALC 39 01/29/2024   TRIG 94 01/29/2024   CHOLHDL 2.0 01/29/2024    Lab Results  Component Value Date   HGBA1C 6.2 (H) 12/27/2023   No results for input(s): TSH, T4TOTAL, FREET4, T3FREE, THYROIDAB in the last 72 hours. No results for input(s): VITAMINB12, FOLATE, FERRITIN, TIBC, IRON , RETICCTPCT in the last 72 hours. ------------------------------------------------------------------------------------------------------------------ Cardiac Enzymes No results for input(s): CKMB, TROPONINI, MYOGLOBIN in the last 168 hours.  Invalid input(s): CK  Micro Results Recent Results (from the past 240 hours)  Blood Culture (routine x 2)     Status: None   Collection Time: 11/15/24 12:08 PM   Specimen: BLOOD RIGHT HAND  Result Value Ref Range Status   Specimen Description BLOOD RIGHT HAND  Final   Special Requests   Final    BOTTLES DRAWN AEROBIC AND ANAEROBIC Blood Culture results may not be optimal due to an inadequate volume of blood received in culture bottles   Culture   Final    NO GROWTH 5 DAYS Performed at [facility], [address] [place], [place] [address]    Report Status 11/20/2024 FINAL  Final  Blood Culture (routine x 2)     Status: None   Collection Time: 11/15/24 12:13 PM   Specimen: BLOOD LEFT HAND  Result Value Ref Range Status   Specimen Description BLOOD LEFT HAND  Final   Special Requests   Final    BOTTLES DRAWN AEROBIC AND ANAEROBIC Blood Culture adequate volume   Culture   Final    NO GROWTH 5 DAYS Performed at [facility], [address], [place], [place] [address]    Report Status 11/20/2024 FINAL  Final  Resp panel by RT-PCR (RSV, Flu A&B, Covid) Anterior Nasal Swab     Status: None   Collection Time: 11/15/24  12:46 PM   Specimen: Anterior Nasal Swab  Result Value Ref Range Status   SARS Coronavirus 2 by RT PCR NEGATIVE NEGATIVE Final   Influenza A by PCR NEGATIVE NEGATIVE Final   Influenza B by PCR NEGATIVE NEGATIVE Final    Comment: (NOTE) The Xpert Xpress SARS-CoV-2/FLU/RSV plus assay is intended as an aid in the diagnosis of influenza from Nasopharyngeal swab specimens and should not be used as a sole basis for treatment. Nasal washings and aspirates are unacceptable for Xpert Xpress SARS-CoV-2/FLU/RSV testing.  Fact Sheet for Patients: bloggercourse.com  Fact Sheet for Healthcare Providers: seriousbroker.it  This test is not yet approved or cleared by the [place]  FDA and has been authorized for detection and/or diagnosis of SARS-CoV-2 by FDA under an Emergency Use Authorization (EUA). This EUA will remain in effect (meaning this test  can be used) for the duration of the COVID-19 declaration under Section 564(b)(1) of the Act, 21 U.S.C. section 360bbb-3(b)(1), unless the authorization is terminated or revoked.     Resp Syncytial Virus by PCR NEGATIVE NEGATIVE Final    Comment: (NOTE) Fact Sheet for Patients: bloggercourse.com  Fact Sheet for Healthcare Providers: seriousbroker.it  This test is not yet approved or cleared by the [place]  FDA and has been authorized for detection and/or diagnosis of SARS-CoV-2 by FDA under an Emergency Use Authorization (EUA). This EUA will remain in effect (meaning this test can be used) for the duration of the COVID-19 declaration under Section 564(b)(1) of the Act, 21 U.S.C. section 360bbb-3(b)(1), unless the authorization is terminated or revoked.  Performed at [facility], [address] [place], [place] [address]   Respiratory (~20 pathogens) panel by PCR     Status: None   Collection Time: 11/15/24 12:46 PM    Specimen: Nasopharyngeal Swab; Respiratory  Result Value Ref Range Status   Adenovirus NOT DETECTED NOT DETECTED Final   Coronavirus 229E NOT DETECTED NOT DETECTED Final    Comment: (NOTE) The Coronavirus on the Respiratory Panel, DOES NOT test for the novel  Coronavirus (2019 nCoV)    Coronavirus HKU1 NOT DETECTED NOT DETECTED Final   Coronavirus NL63 NOT DETECTED NOT DETECTED Final   Coronavirus OC43 NOT DETECTED NOT DETECTED Final   Metapneumovirus NOT DETECTED NOT DETECTED Final   Rhinovirus / Enterovirus NOT DETECTED NOT DETECTED Final   Influenza A NOT DETECTED NOT DETECTED Final   Influenza B NOT DETECTED NOT DETECTED Final   Parainfluenza Virus 1 NOT DETECTED NOT DETECTED Final   Parainfluenza Virus 2 NOT DETECTED NOT DETECTED Final   Parainfluenza Virus 3 NOT DETECTED NOT DETECTED Final   Parainfluenza Virus 4 NOT DETECTED NOT DETECTED Final   Respiratory Syncytial Virus NOT DETECTED NOT DETECTED Final   Bordetella pertussis NOT DETECTED NOT DETECTED Final   Bordetella Parapertussis NOT DETECTED NOT DETECTED Final   Chlamydophila pneumoniae NOT DETECTED NOT DETECTED Final   Mycoplasma pneumoniae NOT DETECTED NOT DETECTED Final    Comment: Performed at [facility], [address], [place], [place] [address]  MRSA Next Gen by PCR, Nasal     Status: None   Collection Time: 11/16/24 12:40 PM   Specimen: Nasal Mucosa; Nasal Swab  Result Value Ref Range Status   MRSA by PCR Next Gen NOT DETECTED NOT DETECTED Final    Comment: (NOTE) The GeneXpert MRSA Assay (FDA approved for NASAL specimens only), is one component of a comprehensive MRSA colonization surveillance program. It is not intended to diagnose MRSA infection nor to guide or monitor treatment for MRSA infections. Test performance is not FDA approved in patients less than 68 years old. Performed at [facility], [address] [place], [place] [address]   Respiratory (~20 pathogens) panel by PCR      Status: None   Collection Time: 11/18/24  5:28 AM   Specimen: Nasopharyngeal Swab; Respiratory  Result Value Ref Range Status   Adenovirus NOT DETECTED NOT DETECTED Final   Coronavirus 229E NOT DETECTED NOT DETECTED Final    Comment: (NOTE) The Coronavirus on the Respiratory Panel, DOES NOT test for the novel  Coronavirus (2019 nCoV)    Coronavirus HKU1 NOT DETECTED NOT DETECTED Final   Coronavirus NL63 NOT DETECTED NOT DETECTED Final   Coronavirus OC43 NOT DETECTED NOT DETECTED Final   Metapneumovirus NOT DETECTED NOT DETECTED Final  Rhinovirus / Enterovirus NOT DETECTED NOT DETECTED Final   Influenza A NOT DETECTED NOT DETECTED Final   Influenza B NOT DETECTED NOT DETECTED Final   Parainfluenza Virus 1 NOT DETECTED NOT DETECTED Final   Parainfluenza Virus 2 NOT DETECTED NOT DETECTED Final   Parainfluenza Virus 3 NOT DETECTED NOT DETECTED Final   Parainfluenza Virus 4 NOT DETECTED NOT DETECTED Final   Respiratory Syncytial Virus NOT DETECTED NOT DETECTED Final   Bordetella pertussis NOT DETECTED NOT DETECTED Final   Bordetella Parapertussis NOT DETECTED NOT DETECTED Final   Chlamydophila pneumoniae NOT DETECTED NOT DETECTED Final   Mycoplasma pneumoniae NOT DETECTED NOT DETECTED Final    Comment: Performed at [facility], [address], [place], [place] [address]    Radiology Reports  CT ABDOMEN PELVIS WO CONTRAST Result Date: 11/20/2024 EXAM: CT ABDOMEN AND PELVIS WITHOUT CONTRAST 11/20/2024 02:16:11 PM TECHNIQUE: CT of the abdomen and pelvis was performed without the administration of intravenous contrast. Multiplanar reformatted images are provided for review. Automated exposure control, iterative reconstruction, and/or weight-based adjustment of the mA/kV was utilized to reduce the radiation dose to as low as reasonably achievable. COMPARISON: 11/15/2024 CLINICAL HISTORY: Fever, left renal cyst versus abscess. Renal insufficiency. FINDINGS: LOWER CHEST: Mild cardiomegaly.  Small pericardial effusion. Trace left pleural effusion with mild atelectasis posteriorly in the left lower lobe and in the posterobasal segment right lower lobe. 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 LIVER: Stable 4 mm fluid density lesion inferiorly in the right hepatic lobe on image 26 series 4. Faint calcifications along the margin of this lesion which is partially exophytic. Other tiny hypodense lesions are scattered in the liver and similar to prior. GALLBLADDER AND BILE DUCTS: Cholecystectomy. No biliary ductal dilatation. SPLEEN: No acute abnormality. PANCREAS: No acute abnormality. ADRENAL GLANDS: No acute abnormality. KIDNEYS, URETERS AND BLADDER: Hypodense lesions are present in both kidneys compatible with benign cysts. There is also a complex 8.4 x 7.2 cm lesion of the left kidney upper pole, internal density 24 HU, previously measuring 7.5 x 6.4 cm. This could be a complex cyst, an abscess, or a mass. There is some faint left perirenal stranding although not increased from 11/15/2024. Right lower quadrant ileostomy. Right abdominal ileal conduit noted. The urinary bladder and prostate gland appear to be absent. No stones in the kidneys or ureters. No hydronephrosis. No perinephric or periureteral stranding. GI AND BOWEL: Stomach demonstrates no acute abnormality. Prior administered contrast medium extends to the rectum. A supraumbilical hernia contains adipose tissue and a margin of the transverse colon as shown on image 83 series 8. There is no bowel obstruction. PERITONEUM AND RETROPERITONEUM: No ascites. No free air. VASCULATURE: Aorta is normal in caliber. Infrarenal IVC filter noted. Systemic atherosclerosis is present, including the aorta and iliac arteries. LYMPH NODES: Small retroperitoneal lymph nodes are not pathologically enlarged but may be reactive. REPRODUCTIVE ORGANS: Prostate gland appears to be absent. BONES AND SOFT TISSUES: Posterolateral rod  and pedicle screw fixation at L3-L4, L4-L5, and L5-S1 with associated posterior decompression spanning from L2-L3 through L5. Interbody space artifact at diffused levels. A supraumbilical hernia contains adipose tissue and a margin of the transverse colon as shown on image 83 series 8. No acute osseous abnormality. No focal soft tissue abnormality. IMPRESSION: 1. Enlarging 8.4 x 7.2 cm complex left upper pole renal lesion measuring 24 HU with stable mild left perirenal stranding, which could represent a renal abscess or a complex/hemorrhagic cyst. Given the enlargement over  the last 5 days, malignancy seems less likely. With clinical signs of urinary tract infection such as pyuria. 2. Small retroperitoneal lymph nodes which are not pathologically enlarged and may be reactive. 3. Additional chronic and incidental findings include bilateral renal cysts, mild cardiomegaly with small pericardial effusion, trace left pleural effusion with mild bibasilar atelectasis and lower lobe bronchiectasis with airway plugging, stable hepatic lesions, cholecystectomy, right lower quadrant ileostomy and right abdominal ileal conduit with absent bladder and prostate, supraumbilical hernia containing fat and a portion of transverse colon, infrarenal IVC filter, systemic atherosclerosis, and postsurgical lumbar fusion and decompression changes as described in the report body. Electronically signed by: Ryan Salvage MD MD 11/20/2024 02:51 PM EST RP Workstation: [id number]   DG Swallowing Func-Speech Pathology Result Date: 11/19/2024 Table formatting from the original result was not included. Modified Barium Swallow Study Patient Details Name: Jaskirat Schwieger. MRN: [id number] Date of Birth: 11-Nov-1941 Today's Date: 11/19/2024 HPI/PMH: HPI: Mr. Gaunt is an 84 yo presenting with weakness and lethargy. Admitted with severe sepsis likely from PNA with CT Chest showing L > R infiltrates and bronchiectasis. Previous swallow eval in March 2025  with normal appearing swallow. PMH includes: parkinsonism, dementia, prostate ca, CKDIII, HTN, HLD Clinical Impression: Pt presents with overall functional oropharyngeal swallowing. There is trace oropharyngeal residue that is managed with a subswallow. Laryngeal closure is complete with no penetration/aspiration occurring across all trials. He took the 13 mm barium tablet with thin liquids, which progressed through the pharynx and esophagus quickly, though he does endorse coughing when eating vegetables recently that may be representative of an esophageal component (could consider dedicated assessment). Continue current diet without ongoing SLP f/u. Factors that may increase risk of adverse event in presence of aspiration Noe & Lianne 2021): Factors that may increase risk of adverse event in presence of aspiration Noe & Lianne 2021): Reduced cognitive function; Limited mobility Recommendations/Plan: Swallowing Evaluation Recommendations Swallowing Evaluation Recommendations Recommendations: PO diet PO Diet Recommendation: Regular; Thin liquids (Level 0) Liquid Administration via: Cup; Straw Medication Administration: Whole meds with liquid Supervision: Staff to assist with self-feeding; Intermittent supervision/cueing for swallowing strategies Swallowing strategies  : Minimize environmental distractions; Slow rate; Small bites/sips Postural changes: Position pt fully upright for meals Oral care recommendations: Oral care BID (2x/day) Treatment Plan Treatment Plan Treatment recommendations: No treatment recommended at this time Follow-up recommendations: No SLP follow up Functional status assessment: Patient has had a recent decline in their functional status and demonstrates the ability to make significant improvements in function in a reasonable and predictable amount of time. Recommendations Recommendations for follow up therapy are one component of a multi-disciplinary discharge planning process, led  by the attending physician.  Recommendations may be updated based on patient status, additional functional criteria and insurance authorization. Assessment: Orofacial Exam: Orofacial Exam Oral Cavity: Oral Hygiene: WFL Oral Cavity - Dentition: Adequate natural dentition Orofacial Anatomy: WFL Oral Motor/Sensory Function: WFL Anatomy: Anatomy: Suspected cervical osteophytes Boluses Administered: Boluses Administered Boluses Administered: Thin liquids (Level 0); Mildly thick liquids (Level 2, nectar thick); Moderately thick liquids (Level 3, honey thick); Puree; Solid  Oral Impairment Domain: Oral Impairment Domain Lip Closure: No labial escape Tongue control during bolus hold: Cohesive bolus between tongue to palatal seal Bolus preparation/mastication: Timely and efficient chewing and mashing Bolus transport/lingual motion: Brisk tongue motion Oral residue: Complete oral clearance Location of oral residue : N/A Initiation of pharyngeal swallow : Pyriform sinuses  Pharyngeal Impairment Domain: Pharyngeal Impairment Domain Soft palate elevation: No bolus between  soft palate (SP)/pharyngeal wall (PW) Laryngeal elevation: Complete superior movement of thyroid  cartilage with complete approximation of arytenoids to epiglottic petiole Anterior hyoid excursion: Complete anterior movement Epiglottic movement: Complete inversion Laryngeal vestibule closure: Complete, no air/contrast in laryngeal vestibule Pharyngeal stripping wave : Present - complete Pharyngeal contraction (A/P view only): N/A Pharyngoesophageal segment opening: Complete distension and complete duration, no obstruction of flow Tongue base retraction: No contrast between tongue base and posterior pharyngeal wall (PPW) Pharyngeal residue: Trace residue within or on pharyngeal structures Location of pharyngeal residue: Tongue base; Valleculae  Esophageal Impairment Domain: Esophageal Impairment Domain Esophageal clearance upright position: Complete clearance,  esophageal coating Pill: Pill Consistency administered: Thin liquids (Level 0) Thin liquids (Level 0): [facility] Penetration/Aspiration Scale Score: Penetration/Aspiration Scale Score 1.  Material does not enter airway: Thin liquids (Level 0); Mildly thick liquids (Level 2, nectar thick); Moderately thick liquids (Level 3, honey thick); Puree; Solid; Pill Compensatory Strategies: Compensatory Strategies Compensatory strategies: No   General Information: Caregiver present: No  Diet Prior to this Study: Regular; Thin liquids (Level 0)   Temperature : Normal   Respiratory Status: WFL   Supplemental O2: None (Room air)   History of Recent Intubation: No  Behavior/Cognition: Alert; Pleasant mood; Cooperative Self-Feeding Abilities: Able to self-feed Baseline vocal quality/speech: Normal Volitional Cough: Able to elicit Volitional Swallow: Able to elicit Exam Limitations: No limitations Goal Planning: Prognosis for improved oropharyngeal function: Good Barriers to Reach Goals: Cognitive deficits No data recorded Patient/Family Stated Goal: none stated Consulted and agree with results and recommendations: Patient; Physician Pain: Pain Assessment Pain Assessment: No/denies pain Faces Pain Scale: 4 Pain Location: R leg Pain Descriptors / Indicators: Sore Pain Intervention(s): Monitored during session End of Session: Start Time:SLP Start Time (ACUTE ONLY): 0955 Stop Time: SLP Stop Time (ACUTE ONLY): 1012 Time Calculation:SLP Time Calculation (min) (ACUTE ONLY): 17 min Charges: SLP Evaluations $ SLP Speech Visit: 1 Visit SLP Evaluations $MBS Swallow: 1 Procedure $Swallowing Treatment: 1 Procedure SLP visit diagnosis: SLP Visit Diagnosis: Dysphagia, oropharyngeal phase (R13.12) Past Medical History: Past Medical History: Diagnosis Date  Allergic rhinitis 07/21/2021  Anemia 04/17/2015  Arthritis   Asthma   Asthma, mild intermittent   Benign essential hypertension 07/21/2021  Bladder cancer (HCC) 2019  CAP (community acquired  pneumonia) 06/26/2023  Degenerative cervical spinal stenosis 07/21/2021  Faintness   History of malignant neoplasm of bladder 07/21/2021  Hyperglycemia 04/17/2015  Hypertension   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  Kidney stone   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  Malnutrition of moderate degree 06/28/2023  Mixed hyperlipidemia 07/21/2021  OSA (obstructive sleep apnea) 07/12/2021  NPSG- 03/16/21- AHI 10.3/ hr, desaturation to 87%, body weight 190 lbs    Renal insufficiency 04/17/2015  Syncope 04/17/2015  Tremor 07/21/2021  Vascular dementia (HCC)   Vitamin D  deficiency 07/21/2021 Past Surgical History: Past Surgical History: Procedure Laterality Date  BACK SURGERY    BIOPSY  10/09/2023  Procedure: BIOPSY;  Surgeon: Elicia Claw, MD;  Location: [facility];  Service: Gastroenterology;;  CHOLECYSTECTOMY    COLONOSCOPY WITH PROPOFOL  Bilateral 10/09/2023  Procedure: COLONOSCOPY WITH PROPOFOL ;  Surgeon: Elicia Claw, MD;  Location: [facility];  Service: Gastroenterology;  Laterality: Bilateral;  ESOPHAGOGASTRODUODENOSCOPY N/A 02/22/2024  Procedure: EGD (ESOPHAGOGASTRODUODENOSCOPY);  Surgeon: Elicia Claw, MD;  Location: [facility] ENDOSCOPY;  Service: Gastroenterology;  Laterality: N/A;  ESOPHAGOGASTRODUODENOSCOPY (EGD) WITH PROPOFOL  Bilateral 10/09/2023  Procedure: ESOPHAGOGASTRODUODENOSCOPY (EGD) WITH PROPOFOL ;  Surgeon: Elicia Claw, MD;  Location: [facility];  Service: Gastroenterology;  Laterality: Bilateral;  HOT HEMOSTASIS N/A 10/09/2023  Procedure: HOT  HEMOSTASIS (ARGON PLASMA COAGULATION/BICAP);  Surgeon: Elicia Claw, MD;  Location: [facility];  Service: Gastroenterology;  Laterality: N/A;  KNEE SURGERY Left   LOWER EXTREMITY VENOGRAPHY  01/29/2024  Procedure: LOWER EXTREMITY VENOGRAPHY;  Surgeon: Jordan, Peter M, MD;  Location: [facility] INVASIVE CV LAB;  Service: Cardiovascular;;  RIGHT/LEFT HEART CATH AND CORONARY ANGIOGRAPHY N/A 01/29/2024  Procedure: RIGHT/LEFT HEART CATH AND CORONARY ANGIOGRAPHY;   Surgeon: Jordan, Peter M, MD;  Location: [facility] INVASIVE CV LAB;  Service: Cardiovascular;  Laterality: N/A;  SHOULDER SURGERY    for rotator cuff tear  VENA CAVA UMBRELLA NECK APPROACH Right 02/22/2024  Procedure: INSERTION, UMBRELLA FILTER, INFERIOR VENA CAVA;  Surgeon: Sheree Penne Bruckner, MD;  Location: [facility] OR;  Service: Vascular;  Laterality: Right; Damien Blumenthal, M.A., CCC-SLP Speech Language Pathology, Acute Rehabilitation Services Secure Chat preferred [phone] 11/19/2024, 11:39 AM  DG CHEST PORT 1 VIEW Result Date: 11/18/2024 EXAM: 1 VIEW(S) XRAY OF THE CHEST 11/18/2024 09:30:00 PM COMPARISON: Portable chest today at 7:04 am. CLINICAL HISTORY: SOB (shortness of breath) FINDINGS: LUNGS AND PLEURA: Consolidation continues to be seen in the left lower lung field. There is a small left pleural effusion, presumably parapneumonic, and a minimal right pleural effusion. Overall aeration seems unchanged. No pneumothorax. HEART AND MEDIASTINUM: Mild cardiomegaly is seen with mild central vascular prominence without visible edema. The mediastinum is normally outlined. BONES AND SOFT TISSUES: Degenerative change and mild dextroscoliosis of the thoracic spine. IMPRESSION: 1. Consolidation in the left lower lung field with a small left pleural effusion, stable overall aeration. 2. Minimal right pleural effusion. 3. Mild cardiomegaly with mild central vascular prominence without visible edema. Electronically signed by: Francis Quam MD 11/18/2024 09:38 PM EST RP Workstation: [id number]      Signature  -   Brayton Lye M.D on 11/20/2024 at 3:26 PM   -  To page go to [email or web address]        "

## 2024-11-20 NOTE — Progress Notes (Signed)
 Occupational Therapy Treatment Patient Details Name: Jerry [name]. 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   OT comments  Patient received in supine and asking to use BSC. Patient was instructed on bed mobility and mod assist to get to EOB. Patient required increased time before attempting transfer to [facility] with mod assist to stand and for balance with transfer. Patient required increased time while on BSC and max assist for toilet hygiene while standing.  Patient able to transfer to recliner and performed grooming and changed socks.  Patient will benefit from continued inpatient follow up therapy, <3 hours/day.  Acute OT to continue to follow to address established goals to facilitate DC to next venue of care.        If plan is discharge home, recommend the following:  A lot of help with walking and/or transfers;A lot of help with bathing/dressing/bathroom;Assist for transportation;Assistance with cooking/housework   Equipment Recommendations  None recommended by OT    Recommendations for Other Services      Precautions / Restrictions Precautions Precautions: Fall Recall of Precautions/Restrictions: Intact Restrictions Weight Bearing Restrictions Per Provider Order: No       Mobility Bed Mobility Overal bed mobility: Needs Assistance Bed Mobility: Supine to Sit     Supine to sit: Min assist, Mod assist     General bed mobility comments: patient assisting with moving BLE off EOB and requiring assistance with trunk    Transfers Overall transfer level: Needs assistance Equipment used: Rolling walker (2 wheels) Transfers: Sit to/from Stand, Bed to chair/wheelchair/BSC Sit to Stand: Mod assist      Step pivot transfers: Mod assist     General transfer comment: step pivot transfer to [facility] and recliner with education on hand placement and safety     Balance Overall balance assessment: Needs assistance Sitting-balance support: Feet supported, Bilateral upper extremity supported Sitting balance-Leahy Scale: Fair Sitting balance - Comments: on EOB Postural control: Posterior lean Standing balance support: Reliant on assistive device for balance, Bilateral upper extremity supported, During functional activity Standing balance-Leahy Scale: Poor Standing balance comment: posterior leaning with patient reliant on BUE support                           ADL either performed or assessed with clinical judgement   ADL Overall ADL's : Needs assistance/impaired Eating/Feeding: Set up;Sitting   Grooming: Set up;Sitting       Lower Body Bathing: Maximal assistance;Sit to/from stand       Lower Body Dressing: Maximal assistance;Sit to/from stand Lower Body Dressing Details (indicate cue type and reason): changed socks Toilet Transfer: Moderate assistance;BSC/3in1;Rolling walker (2 wheels) Toilet Transfer Details (indicate cue type and reason): step pivot transfer to [facility] Toileting- Clothing Manipulation and Hygiene: Maximal assistance;Sit to/from stand Toileting - Clothing Manipulation Details (indicate cue type and reason): max assist for toilet hygiene due to patient being reliant on BUE support            Extremity/Trunk Assessment              Vision       Perception     Praxis     Communication Communication Communication: Impaired Factors Affecting Communication: Difficulty expressing self  Cognition Arousal: Alert Behavior During Therapy: WFL for tasks assessed/performed Cognition: No apparent impairments                               Following commands: Intact Following commands impaired: Follows one step commands with increased  time      Cueing   Cueing Techniques: Verbal cues, Tactile cues, Visual cues  Exercises      Shoulder Instructions       General Comments VSS on RA    Pertinent Vitals/ Pain       Pain Assessment Pain Assessment: Faces Faces Pain Scale: Hurts little more Pain Location: Back Pain Descriptors / Indicators: Aching, Sore Pain Intervention(s): Limited activity within patient's tolerance, Monitored during session, Repositioned  Home Living                                          Prior Functioning/Environment              Frequency  Min 2X/week        Progress Toward Goals  OT Goals(current goals can now be found in the care plan section)  Progress towards OT goals: Progressing toward goals  Acute Rehab OT Goals Patient Stated Goal: to go home OT Goal Formulation: With patient Time For Goal Achievement: 12/02/24 Potential to Achieve Goals: Good ADL Goals Pt Will Perform Grooming: with contact guard assist;standing Pt Will Perform Upper Body Dressing: with set-up;sitting Pt Will Perform Lower Body Dressing: with min assist;sit to/from stand Pt Will Transfer to Toilet: with contact guard assist;ambulating;regular height toilet Pt/caregiver will Perform Home Exercise Program: Increased strength;Both right and left upper extremity;With theraband;With Supervision  Plan      Co-evaluation                 AM-PAC OT 6 Clicks Daily Activity     Outcome Measure   Help from another person eating meals?: None Help from another person taking care of personal grooming?: A Little Help from another person toileting, which includes using toliet, bedpan, or urinal?: A Lot Help from another person bathing (including washing, rinsing, drying)?: A Lot Help from another person to put on and taking off regular upper body clothing?: A Lot Help from another person to put on and taking off regular lower body clothing?: A Lot 6 Click Score: 15    End  of Session Equipment Utilized During Treatment: Rolling walker (2 wheels);Gait belt  OT Visit Diagnosis: Unsteadiness on feet (R26.81);Muscle weakness (generalized) (M62.81);Pain Pain - part of body:  (back)   Activity Tolerance Patient tolerated treatment well   Patient Left in chair;with call bell/phone within reach;with chair alarm set   Nurse Communication Mobility status        Time: 8894-8853 OT Time Calculation (min): 41 min  Charges: OT General Charges $OT Visit: 1 Visit OT Treatments $Self Care/Home Management : 38-52 mins  Dick Laine, OTA Acute Rehabilitation Services  Office [phone]   Jeb LITTIE Laine 11/20/2024, 1:35 PM

## 2024-11-21 ENCOUNTER — Inpatient Hospital Stay (HOSPITAL_COMMUNITY)

## 2024-11-21 DIAGNOSIS — A419 Sepsis, unspecified organism: Secondary | ICD-10-CM | POA: Diagnosis not present

## 2024-11-21 DIAGNOSIS — R6520 Severe sepsis without septic shock: Secondary | ICD-10-CM | POA: Diagnosis not present

## 2024-11-21 HISTORY — PX: IR US GUIDE BX ASP/DRAIN: IMG2392

## 2024-11-21 LAB — CBC
HCT: 28.5 % — ABNORMAL LOW (ref 39.0–52.0)
Hemoglobin: 8.6 g/dL — ABNORMAL LOW (ref 13.0–17.0)
MCH: 22 pg — ABNORMAL LOW (ref 26.0–34.0)
MCHC: 30.2 g/dL (ref 30.0–36.0)
MCV: 72.9 fL — ABNORMAL LOW (ref 80.0–100.0)
Platelets: 298 K/uL (ref 150–400)
RBC: 3.91 MIL/uL — ABNORMAL LOW (ref 4.22–5.81)
RDW: 15.9 % — ABNORMAL HIGH (ref 11.5–15.5)
WBC: 15.5 K/uL — ABNORMAL HIGH (ref 4.0–10.5)
nRBC: 0 % (ref 0.0–0.2)

## 2024-11-21 LAB — MAGNESIUM: Magnesium: 2 mg/dL (ref 1.7–2.4)

## 2024-11-21 LAB — BASIC METABOLIC PANEL WITH GFR
Anion gap: 12 (ref 5–15)
BUN: 60 mg/dL — ABNORMAL HIGH (ref 8–23)
CO2: 21 mmol/L — ABNORMAL LOW (ref 22–32)
Calcium: 9 mg/dL (ref 8.9–10.3)
Chloride: 109 mmol/L (ref 98–111)
Creatinine, Ser: 2.57 mg/dL — ABNORMAL HIGH (ref 0.61–1.24)
GFR, Estimated: 24 mL/min — ABNORMAL LOW
Glucose, Bld: 108 mg/dL — ABNORMAL HIGH (ref 70–99)
Potassium: 4.6 mmol/L (ref 3.5–5.1)
Sodium: 141 mmol/L (ref 135–145)

## 2024-11-21 LAB — PROTIME-INR
INR: 1.3 — ABNORMAL HIGH (ref 0.8–1.2)
Prothrombin Time: 17.1 s — ABNORMAL HIGH (ref 11.4–15.2)

## 2024-11-21 LAB — PRO BRAIN NATRIURETIC PEPTIDE: Pro Brain Natriuretic Peptide: 748 pg/mL — ABNORMAL HIGH

## 2024-11-21 LAB — PHOSPHORUS: Phosphorus: 3.1 mg/dL (ref 2.5–4.6)

## 2024-11-21 LAB — PROCALCITONIN: Procalcitonin: 1.32 ng/mL

## 2024-11-21 LAB — C-REACTIVE PROTEIN: CRP: 18.6 mg/dL — ABNORMAL HIGH

## 2024-11-21 MED ORDER — LIDOCAINE-EPINEPHRINE 1 %-1:100000 IJ SOLN
INTRAMUSCULAR | Status: AC
  Filled 2024-11-21: qty 20

## 2024-11-21 MED ORDER — MIDAZOLAM HCL 2 MG/2ML IJ SOLN
INTRAMUSCULAR | Status: AC
  Filled 2024-11-21: qty 2

## 2024-11-21 MED ORDER — FENTANYL CITRATE (PF) 100 MCG/2ML IJ SOLN
INTRAMUSCULAR | Status: AC
  Filled 2024-11-21: qty 2

## 2024-11-21 MED ORDER — MIDAZOLAM HCL (PF) 2 MG/2ML IJ SOLN
INTRAMUSCULAR | Status: AC | PRN
  Administered 2024-11-21: .5 mg via INTRAVENOUS

## 2024-11-21 MED ORDER — FENTANYL CITRATE (PF) 100 MCG/2ML IJ SOLN
INTRAMUSCULAR | Status: AC | PRN
  Administered 2024-11-21: 25 ug via INTRAVENOUS

## 2024-11-21 NOTE — Plan of Care (Signed)

## 2024-11-21 NOTE — Progress Notes (Signed)
 Physical Therapy Treatment Patient Details Name: Jerry [name]. 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    PT Comments  Pt demonstrates difficulty initiating movement of R LE during bed mobility and stepping towards chair in standing. Pt requires modA for bed mobility with mild posterior lean upon sitting that is quickly corrected with cues. Posterior lean returns upon STS, with pt's weight in his heels throughout, that is corrected with light overpressure. Pt requires increased verbal and tactile cuing to initiate weight shift to L in order to encourage step towards the R to transfer to chair. Sequencing and initiation improved when transferring to [facility]. STS x5 completed during session to promote strength and improve trunk extension during functional mobility due to pt sinking into forward shoulder posture with fatigue.  Pt would benefit from continued PT services focused on bed mobility, strength, balance, transfers, and progressing gait to promote independence and safety with functional mobility.     If plan is discharge home, recommend the following: A lot of help with walking and/or transfers;A lot of help with bathing/dressing/bathroom;Assistance with cooking/housework;Assist for transportation;Help with stairs or ramp for entrance;Direct supervision/assist for medications management;Direct supervision/assist for financial management   Can travel by private vehicle     No  Equipment Recommendations  Rolling walker (2 wheels);BSC/3in1;Wheelchair (measurements PT);Wheelchair cushion (measurements PT)    Recommendations for Other Services       Precautions / Restrictions Precautions Precautions: Fall Recall  of Precautions/Restrictions: Intact Restrictions Weight Bearing Restrictions Per Provider Order: No     Mobility  Bed Mobility Overal bed mobility: Needs Assistance Bed Mobility: Supine to Sit     Supine to sit: Mod assist     General bed mobility comments: Attempts to mobilize B LE towards EOB, transitions 1-2 bouts laterally before requesting assist. Pt appears to have difficulty initiating and sequencing movements for bed mobility. Assist to reposition EOB, pt unable to pivot hips due to tremors when pushing down to weight shift.    Transfers Overall transfer level: Needs assistance Equipment used: Rolling walker (2 wheels) Transfers: Sit to/from Stand, Bed to chair/wheelchair/BSC Sit to Stand: Mod assist   Step pivot transfers: Mod assist       General transfer comment: Pt benefits from rocking leading up to transfer in order to build momentum. Completes STS x5 throughout session with emphasis on trunk extension in stance. Pt sinks into forward shoulder posture with onset of fatigue. Initial difficulty initiating step with R LE, improved with cue to weight shift towards the L. Transferred to chair and commode.    Ambulation/Gait                   Stairs             Wheelchair Mobility     Tilt Bed    Modified Rankin (Stroke Patients Only)       Balance Overall balance assessment: Needs assistance Sitting-balance support: Feet supported, Bilateral upper extremity supported Sitting balance-Leahy Scale: Fair Sitting balance - Comments: Mild posterior lean noted. Requires corrective guarding to maintain posture. Improves with time. Able to maintain seated balance while on BSC without assist. Postural control: Posterior lean Standing balance support: Reliant on assistive device for balance, Bilateral upper extremity supported,  During functional activity Standing balance-Leahy Scale: Fair Standing balance comment: Posterior lean throughout STS, able to  be corrected in stance. Continues to sink into forward posture postion with onset of fatigue. Unsteady throughout due to tremors, present at baseline. Assist to balance during weight shift to L in order to offload R LE to initiate step.                            Communication Communication Communication: Impaired Factors Affecting Communication: Difficulty expressing self  Cognition Arousal: Alert Behavior During Therapy: WFL for tasks assessed/performed   PT - Cognitive impairments: Sequencing, Problem solving                       PT - Cognition Comments: Speaks very softly, doesn't always respond to questions on first attempt. Following commands: Intact Following commands impaired: Follows one step commands with increased time    Cueing Cueing Techniques: Verbal cues, Tactile cues, Visual cues  Exercises Other Exercises Other Exercises: STS x3    General Comments General comments (skin integrity, edema, etc.): VSS throughout. No new skin abnormalities noted.      Pertinent Vitals/Pain Pain Assessment Pain Assessment: No/denies pain    Home Living                          Prior Function            PT Goals (current goals can now be found in the care plan section) Acute Rehab PT Goals Patient Stated Goal: None stated Progress towards PT goals: Progressing toward goals    Frequency    Min 2X/week      PT Plan      Co-evaluation              AM-PAC PT 6 Clicks Mobility   Outcome Measure  Help needed turning from your back to your side while in a flat bed without using bedrails?: A Little Help needed moving from lying on your back to sitting on the side of a flat bed without using bedrails?: A Lot Help needed moving to and from a bed to a chair (including a wheelchair)?: A Lot Help needed standing up from a chair using your arms (e.g., wheelchair or bedside chair)?: A Lot Help needed to walk in hospital room?:  Total Help needed climbing 3-5 steps with a railing? : Total 6 Click Score: 11    End of Session Equipment Utilized During Treatment: Gait belt Activity Tolerance: Patient limited by fatigue Patient left: with call bell/phone within reach;with chair alarm set;with nursing/sitter in room (Pt on BSC, NT notified and outside room when pt is ready) Nurse Communication: Mobility status PT Visit Diagnosis: Unsteadiness on feet (R26.81);Muscle weakness (generalized) (M62.81)     Time: 9069-9045 PT Time Calculation (min) (ACUTE ONLY): 24 min  Charges:    $Therapeutic Activity: 23-37 mins PT General Charges $$ ACUTE PT VISIT: 1 Visit                     Sabra Morel, PT, DPT  Acute Rehabilitation Services         Office: [phone]      Sabra MARLA Morel 11/21/2024, 12:12 PM

## 2024-11-21 NOTE — Procedures (Signed)
 Interventional Radiology Procedure Note  Procedure: Ultrasound guided left renal abscess drain placement   Findings: Please refer to procedural dictation for full description.10 Fr drain placed into large left renal abscess, aspirating approximately 300 mL of purulent fluid.  Drain placed to bulb suction.  Complications: None immediate  Estimated Blood Loss:  <5 mL  Recommendations: Keep to bulb suction until output clears. Follow aspirate culture. IR will follow.   Ester Sides, MD

## 2024-11-21 NOTE — Consult Note (Addendum)
 "   Chief Complaint: L renal fluid collection  Referring Provider(s): Dr. Sherlon  Supervising Physician: Jennefer Rover  Patient Status: [facility] - In-pt  History of Present Illness: Jerry [name]. is a 84 y.o. 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  Patient was recently diagnosed with sepsis and admitted to the hospital with suspected pulmonary source at that time.  Despite treatment with antibiotics while inpatient over the past 5 days, patient remains intermittently febrile with elevated white count and poorly improved clinical appearance.  The inpatient team reached out to IR for aspiration of left renal fluid collection.  The patient has not been able to undergo contrasted CT imaging due to renal function, but noncontrast studies suggest increase in size of fluid collection during hospitalization.  Request for aspiration to decompress the lesion and provide same for cultures to evaluate as possible source for continued symptoms.  Does not wear CPAP or use supplemental home O2.   Allergies Reviewed:  Patient has no known allergies.   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   Malnutrition of moderate degree 06/28/2023   Mixed hyperlipidemia 07/21/2021   OSA (obstructive sleep apnea) 07/12/2021   NPSG- 03/16/21- AHI 10.3/  hr, desaturation to 87%, body weight 190 lbs     Renal insufficiency 04/17/2015   Syncope 04/17/2015   Tremor 07/21/2021   Vascular dementia (HCC)    Vitamin D  deficiency 07/21/2021    Past Surgical History:  Procedure Laterality Date   BACK SURGERY     BIOPSY  10/09/2023   Procedure: BIOPSY;  Surgeon: Elicia Claw, MD;  Location: [facility]OSCOPY;  Service: Gastroenterology;;   CHOLECYSTECTOMY     COLONOSCOPY WITH PROPOFOL  Bilateral 10/09/2023   Procedure: COLONOSCOPY WITH PROPOFOL ;  Surgeon: Elicia Claw, MD;  Location: [facility];  Service: Gastroenterology;  Laterality: Bilateral;   ESOPHAGOGASTRODUODENOSCOPY N/A 02/22/2024   Procedure: EGD (ESOPHAGOGASTRODUODENOSCOPY);  Surgeon: Elicia Claw, MD;  Location: [facility] ENDOSCOPY;  Service: Gastroenterology;  Laterality: N/A;   ESOPHAGOGASTRODUODENOSCOPY (EGD) WITH PROPOFOL  Bilateral 10/09/2023   Procedure: ESOPHAGOGASTRODUODENOSCOPY (EGD) WITH PROPOFOL ;  Surgeon: Elicia Claw, MD;  Location: [facility];  Service: Gastroenterology;  Laterality: Bilateral;   HOT HEMOSTASIS N/A 10/09/2023   Procedure: HOT HEMOSTASIS (ARGON PLASMA COAGULATION/BICAP);  Surgeon: Elicia Claw, MD;  Location: [facility];  Service: Gastroenterology;  Laterality: N/A;   KNEE SURGERY Left    LOWER EXTREMITY VENOGRAPHY  01/29/2024   Procedure: LOWER EXTREMITY VENOGRAPHY;  Surgeon: Jordan, Peter M, MD;  Location: [facility] INVASIVE CV LAB;  Service: Cardiovascular;;   RIGHT/LEFT HEART CATH AND CORONARY ANGIOGRAPHY N/A 01/29/2024   Procedure: RIGHT/LEFT HEART CATH AND CORONARY ANGIOGRAPHY;  Surgeon: Jordan, Peter M, MD;  Location: [facility] INVASIVE CV LAB;  Service: Cardiovascular;  Laterality: N/A;   SHOULDER SURGERY     for rotator cuff tear   VENA CAVA UMBRELLA  NECK APPROACH Right 02/22/2024   Procedure: INSERTION, UMBRELLA FILTER, INFERIOR VENA CAVA;  Surgeon: Sheree Penne Bruckner, MD;  [facility] OR;  Service: Vascular;  Laterality: Right;       Medications: Prior to Admission medications  Medication Sig Start Date End Date Taking? Authorizing Provider  albuterol  (VENTOLIN  HFA) 108 (90 Base) MCG/ACT inhaler Inhale 2 puffs into the lungs every 6 (six) hours as needed for wheezing or shortness of breath. 07/12/21  Yes Young, Reggy D, MD  ALPRAZolam  (XANAX ) 0.5 MG tablet Take 0.5 mg by mouth 2 (two) times daily as needed for anxiety.   Yes [provider]  amLODipine  (NORVASC ) 10 MG tablet Take 5 mg by mouth daily. 08/04/19  Yes [provider]  atorvastatin  (LIPITOR) 20 MG tablet Take 1 tablet (20 mg total) by mouth daily. 02/04/24 11/16/24 Yes Christobal Guadalajara, MD  Chlorphen-PE-Acetaminophen  4-10-325 MG TABS Take 1 tablet by mouth daily as needed (For sinus). 02/20/24  Yes [provider]  Cholecalciferol  50 MCG (2000 UT) TABS Take 2,000 Units by mouth daily.   Yes [provider]  Cyanocobalamin  (VITAMIN B12 PO) Take 1 tablet by mouth daily.   Yes [provider]  escitalopram  (LEXAPRO ) 10 MG tablet Take 10 mg by mouth daily.   Yes [provider]  FeFum-FePoly-FA-B Cmp-C-Biot (FOLIVANE-PLUS) CAPS Take 1 capsule by mouth daily.   Yes [provider]  furosemide  (LASIX ) 40 MG tablet Take 40 mg daily only if needed for swelling or weight gain. 04/24/24  Yes Patwardhan, Manish J, MD  ipratropium-albuterol  (DUONEB) 0.5-2.5 (3) MG/3ML SOLN Inhale 3 mLs into the lungs every 2 (two) hours as needed (SOB/Wheezing).   Yes [provider]  Magnesium  Oxide, Laxative, 500 MG TABS Take 1 tablet by mouth daily.   Yes [provider]  memantine  (NAMENDA ) 10 MG tablet Take 10 mg by mouth 2 (two) times daily. 02/17/24  Yes [provider]  OLANZapine  (ZYPREXA ) 2.5 MG tablet Take 1 tablet (2.5 mg total) by mouth 2 (two) times daily as needed (agitation). 06/05/23  Yes Randol Simmonds, MD  pantoprazole  (PROTONIX ) 40 MG tablet Take 1 tablet (40 mg total) by mouth 2 (two) times  daily. Patient taking differently: Take 40 mg by mouth 2 (two) times daily as needed (acid reflex). 02/18/24  Yes Mdala-Gausi, Masiku Agatha, MD  Probiotic Product (ULTRAFLORA IMMUNE HEALTH PO) Take 1 tablet by mouth daily.   Yes [provider]  DULERA  200-5 MCG/ACT AERO Inhale 2 puffs into the lungs 2 (two) times daily. Patient not taking: Reported on 11/16/2024    [provider]     Family History  Problem Relation Age of Onset   Diabetes Mother    Diabetes Father     Social History   Socioeconomic History   Marital status: Married    Spouse name: Not on file   Number of children: Not on file   Years of education: Not on file   Highest education level: Not on file  Occupational History   Not on file  Tobacco Use   Smoking status: Never   Smokeless tobacco: Never  Vaping Use   Vaping status: Never Used  Substance and Sexual Activity   Alcohol use: Yes    Alcohol/week: 3.0 standard drinks of alcohol    Types: 3 Standard drinks or equivalent per week   Drug use: Yes    Types: Marijuana   Sexual activity: Not on file  Other Topics Concern   Not on file  Social History Narrative   Not on file   Social Drivers of Health   Tobacco Use: Low Risk (11/15/2024)   Patient History    Smoking Tobacco Use: Never    Smokeless Tobacco Use: Never    Passive Exposure: Not on file  Financial Resource Strain: Not on file  Food Insecurity: No Food Insecurity (11/16/2024)   Epic    Worried About Programme Researcher, Broadcasting/film/video in the Last Year: Never true    Ran Out of Food in the Last Year: Never true  Transportation Needs: No Transportation Needs (11/16/2024)   Epic    Lack of Transportation (Medical): No    Lack of Transportation (Non-Medical): No  Physical Activity: Not on file  Stress: Not on file  Social Connections: Moderately Isolated (11/16/2024)   Social Connection and Isolation Panel    Frequency of Communication with Friends and Family: Three times a week    Frequency  of Social Gatherings with Friends and Family: Three times a week    Attends Religious Services: Never    Active Member of Clubs or Organizations: No    Attends Banker Meetings: Never    Marital Status: Married  Depression (PHQ2-9): Low Risk (04/14/2024)   Depression (PHQ2-9)    PHQ-2 Score: 3  Alcohol Screen: Not on file  Housing: Low Risk (11/16/2024)   Epic    Unable to Pay for Housing in the Last Year: No    Number of Times Moved in the Last Year: 0    Homeless in the Last Year: No  Utilities: Not At Risk (11/16/2024)   Epic    Threatened with loss of utilities: No  Health Literacy: Not on file     Review of Systems: A 12 point ROS discussed and pertinent positives are indicated in the HPI above.  All other systems are negative.  Review of Systems  Constitutional:  Positive for fatigue and fever. Negative for chills.  HENT:  Negative for sore throat.   Respiratory:  Negative for chest tightness and shortness of breath.   Gastrointestinal:  Negative for abdominal distention, abdominal pain, blood in stool, nausea and vomiting.  Genitourinary:  Negative for flank pain and hematuria.  Neurological:  Positive for tremors and weakness.    Vital Signs: BP 137/69 (BP Location: Right Arm)   Pulse 99   Temp 99 F (37.2 C) (Oral)   Resp (!) 21   Ht 5' 11 (1.803 m)   Wt 185 lb (83.9 kg)   SpO2 100%   BMI 25.80 kg/m      Physical Exam HENT:     Mouth/Throat:     Mouth: Mucous membranes are moist.     Pharynx: Oropharynx is clear.  Cardiovascular:     Rate and Rhythm: Normal rate.     Pulses: Normal pulses.     Heart sounds: Normal heart sounds.  Pulmonary:     Effort: Pulmonary effort is normal.     Comments: Diminished bilaterally Abdominal:     General: There is no distension.     Palpations: Abdomen is soft.     Tenderness: There is no abdominal tenderness.     Comments: RLQ urostomy   Skin:    General: Skin is warm and dry.  Neurological:      Mental Status: He is alert. Mental status is at baseline. He is disoriented.     Sensory: No sensory deficit.  Psychiatric:        Mood and Affect: Mood  normal.        Behavior: Behavior normal.     Imaging: CT ABDOMEN PELVIS WO CONTRAST Result Date: 11/20/2024 EXAM: CT ABDOMEN AND PELVIS WITHOUT CONTRAST 11/20/2024 02:16:11 PM TECHNIQUE: CT of the abdomen and pelvis was performed without the administration of intravenous contrast. Multiplanar reformatted images are provided for review. Automated exposure control, iterative reconstruction, and/or weight-based adjustment of the mA/kV was utilized to reduce the radiation dose to as low as reasonably achievable. COMPARISON: 11/15/2024 CLINICAL HISTORY: Fever, left renal cyst versus abscess. Renal insufficiency. FINDINGS: LOWER CHEST: Mild cardiomegaly. Small pericardial effusion. Trace left pleural effusion with mild atelectasis posteriorly in the left lower lobe and in the posterobasal segment right lower lobe. 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 LIVER: Stable 4 mm fluid density lesion inferiorly in the right hepatic lobe on image 26 series 4. Faint calcifications along the margin of this lesion which is partially exophytic. Other tiny hypodense lesions are scattered in the liver and similar to prior. GALLBLADDER AND BILE DUCTS: Cholecystectomy. No biliary ductal dilatation. SPLEEN: No acute abnormality. PANCREAS: No acute abnormality. ADRENAL GLANDS: No acute abnormality. KIDNEYS, URETERS AND BLADDER: Hypodense lesions are present in both kidneys compatible with benign cysts. There is also a complex 8.4 x 7.2 cm lesion of the left kidney upper pole, internal density 24 HU, previously measuring 7.5 x 6.4 cm. This could be a complex cyst, an abscess, or a mass. There is some faint left perirenal stranding although not increased from 11/15/2024. Right lower quadrant ileostomy. Right abdominal ileal conduit noted. The  urinary bladder and prostate gland appear to be absent. No stones in the kidneys or ureters. No hydronephrosis. No perinephric or periureteral stranding. GI AND BOWEL: Stomach demonstrates no acute abnormality. Prior administered contrast medium extends to the rectum. A supraumbilical hernia contains adipose tissue and a margin of the transverse colon as shown on image 83 series 8. There is no bowel obstruction. PERITONEUM AND RETROPERITONEUM: No ascites. No free air. VASCULATURE: Aorta is normal in caliber. Infrarenal IVC filter noted. Systemic atherosclerosis is present, including the aorta and iliac arteries. LYMPH NODES: Small retroperitoneal lymph nodes are not pathologically enlarged but may be reactive. REPRODUCTIVE ORGANS: Prostate gland appears to be absent. BONES AND SOFT TISSUES: Posterolateral rod and pedicle screw fixation at L3-L4, L4-L5, and L5-S1 with associated posterior decompression spanning from L2-L3 through L5. Interbody space artifact at diffused levels. A supraumbilical hernia contains adipose tissue and a margin of the transverse colon as shown on image 83 series 8. No acute osseous abnormality. No focal soft tissue abnormality. IMPRESSION: 1. Enlarging 8.4 x 7.2 cm complex left upper pole renal lesion measuring 24 HU with stable mild left perirenal stranding, which could represent a renal abscess or a complex/hemorrhagic cyst. Given the enlargement over the last 5 days, malignancy seems less likely. With clinical signs of urinary tract infection such as pyuria. 2. Small retroperitoneal lymph nodes which are not pathologically enlarged and may be reactive. 3. Additional chronic and incidental findings include bilateral renal cysts, mild cardiomegaly with small pericardial effusion, trace left pleural effusion with mild bibasilar atelectasis and lower lobe bronchiectasis with airway plugging, stable hepatic lesions, cholecystectomy, right lower quadrant ileostomy and right abdominal ileal  conduit with absent bladder and prostate, supraumbilical hernia containing fat and a portion of transverse colon, infrarenal IVC filter, systemic atherosclerosis, and postsurgical lumbar fusion and decompression changes as described in the report body. Electronically signed by: Ryan Salvage MD MD 11/20/2024 02:51 PM  EST RP Workstation: [id number]   DG Swallowing Func-Speech Pathology Result Date: 11/19/2024 Table formatting from the original result was not included. Modified Barium Swallow Study Patient Details Name: Dejion Grillo. MRN: [id number] Date of Birth: [birth year] Today's Date: 11/19/2024 HPI/PMH: HPI: Mr. Arquette is an 84 yo presenting with weakness and lethargy. Admitted with severe sepsis likely from PNA with CT Chest showing L > R infiltrates and bronchiectasis. Previous swallow eval in March 2025 with normal appearing swallow. PMH includes: parkinsonism, dementia, prostate ca, CKDIII, HTN, HLD Clinical Impression: Pt presents with overall functional oropharyngeal swallowing. There is trace oropharyngeal residue that is managed with a subswallow. Laryngeal closure is complete with no penetration/aspiration occurring across all trials. He took the 13 mm barium tablet with thin liquids, which progressed through the pharynx and esophagus quickly, though he does endorse coughing when eating vegetables recently that may be representative of an esophageal component (could consider dedicated assessment). Continue current diet without ongoing SLP f/u. Factors that may increase risk of adverse event in presence of aspiration Noe & Lianne 2021): Factors that may increase risk of adverse event in presence of aspiration Noe & Lianne 2021): Reduced cognitive function; Limited mobility Recommendations/Plan: Swallowing Evaluation Recommendations Swallowing Evaluation Recommendations Recommendations: PO diet PO Diet Recommendation: Regular; Thin liquids (Level 0) Liquid Administration via: Cup; Straw  Medication Administration: Whole meds with liquid Supervision: Staff to assist with self-feeding; Intermittent supervision/cueing for swallowing strategies Swallowing strategies  : Minimize environmental distractions; Slow rate; Small bites/sips Postural changes: Position pt fully upright for meals Oral care recommendations: Oral care BID (2x/day) Treatment Plan Treatment Plan Treatment recommendations: No treatment recommended at this time Follow-up recommendations: No SLP follow up Functional status assessment: Patient has had a recent decline in their functional status and demonstrates the ability to make significant improvements in function in a reasonable and predictable amount of time. Recommendations Recommendations for follow up therapy are one component of a multi-disciplinary discharge planning process, led by the attending physician.  Recommendations may be updated based on patient status, additional functional criteria and insurance authorization. Assessment: Orofacial Exam: Orofacial Exam Oral Cavity: Oral Hygiene: WFL Oral Cavity - Dentition: Adequate natural dentition Orofacial Anatomy: WFL Oral Motor/Sensory Function: WFL Anatomy: Anatomy: Suspected cervical osteophytes Boluses Administered: Boluses Administered Boluses Administered: Thin liquids (Level 0); Mildly thick liquids (Level 2, nectar thick); Moderately thick liquids (Level 3, honey thick); Puree; Solid  Oral Impairment Domain: Oral Impairment Domain Lip Closure: No labial escape Tongue control during bolus hold: Cohesive bolus between tongue to palatal seal Bolus preparation/mastication: Timely and efficient chewing and mashing Bolus transport/lingual motion: Brisk tongue motion Oral residue: Complete oral clearance Location of oral residue : N/A Initiation of pharyngeal swallow : Pyriform sinuses  Pharyngeal Impairment Domain: Pharyngeal Impairment Domain Soft palate elevation: No bolus between soft palate (SP)/pharyngeal wall (PW)  Laryngeal elevation: Complete superior movement of thyroid  cartilage with complete approximation of arytenoids to epiglottic petiole Anterior hyoid excursion: Complete anterior movement Epiglottic movement: Complete inversion Laryngeal vestibule closure: Complete, no air/contrast in laryngeal vestibule Pharyngeal stripping wave : Present - complete Pharyngeal contraction (A/P view only): N/A Pharyngoesophageal segment opening: Complete distension and complete duration, no obstruction of flow Tongue base retraction: No contrast between tongue base and posterior pharyngeal wall (PPW) Pharyngeal residue: Trace residue within or on pharyngeal structures Location of pharyngeal residue: Tongue base; Valleculae  Esophageal Impairment Domain: Esophageal Impairment Domain Esophageal clearance upright position: Complete clearance, esophageal coating Pill: Pill Consistency administered: Thin liquids (Level 0) Thin liquids (  Level 0): [facility] Penetration/Aspiration Scale Score: Penetration/Aspiration Scale Score 1.  Material does not enter airway: Thin liquids (Level 0); Mildly thick liquids (Level 2, nectar thick); Moderately thick liquids (Level 3, honey thick); Puree; Solid; Pill Compensatory Strategies: Compensatory Strategies Compensatory strategies: No   General Information: Caregiver present: No  Diet Prior to this Study: Regular; Thin liquids (Level 0)   Temperature : Normal   Respiratory Status: WFL   Supplemental O2: None (Room air)   History of Recent Intubation: No  Behavior/Cognition: Alert; Pleasant mood; Cooperative Self-Feeding Abilities: Able to self-feed Baseline vocal quality/speech: Normal Volitional Cough: Able to elicit Volitional Swallow: Able to elicit Exam Limitations: No limitations Goal Planning: Prognosis for improved oropharyngeal function: Good Barriers to Reach Goals: Cognitive deficits No data recorded Patient/Family Stated Goal: none stated Consulted and agree with results and recommendations:  Patient; Physician Pain: Pain Assessment Pain Assessment: No/denies pain Faces Pain Scale: 4 Pain Location: R leg Pain Descriptors / Indicators: Sore Pain Intervention(s): Monitored during session End of Session: Start Time:SLP Start Time (ACUTE ONLY): 0955 Stop Time: SLP Stop Time (ACUTE ONLY): 1012 Time Calculation:SLP Time Calculation (min) (ACUTE ONLY): 17 min Charges: SLP Evaluations $ SLP Speech Visit: 1 Visit SLP Evaluations $MBS Swallow: 1 Procedure $Swallowing Treatment: 1 Procedure SLP visit diagnosis: SLP Visit Diagnosis: Dysphagia, oropharyngeal phase (R13.12) Past Medical History: Past Medical History: Diagnosis Date  Allergic rhinitis 07/21/2021  Anemia 04/17/2015  Arthritis   Asthma   Asthma, mild intermittent   Benign essential hypertension 07/21/2021  Bladder cancer (HCC) 2019  CAP (community acquired pneumonia) 06/26/2023  Degenerative cervical spinal stenosis 07/21/2021  Faintness   History of malignant neoplasm of bladder 07/21/2021  Hyperglycemia 04/17/2015  Hypertension   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  Kidney stone   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  Malnutrition of moderate degree 06/28/2023  Mixed hyperlipidemia 07/21/2021  OSA (obstructive sleep apnea) 07/12/2021  NPSG- 03/16/21- AHI 10.3/ hr, desaturation to 87%, body weight 190 lbs    Renal insufficiency 04/17/2015  Syncope 04/17/2015  Tremor 07/21/2021  Vascular dementia (HCC)   Vitamin D  deficiency 07/21/2021 Past Surgical History: Past Surgical History: Procedure Laterality Date  BACK SURGERY    BIOPSY  10/09/2023  Procedure: BIOPSY;  Surgeon: Elicia Claw, MD;  Location: [facility];  Service: Gastroenterology;;  CHOLECYSTECTOMY    COLONOSCOPY WITH PROPOFOL  Bilateral 10/09/2023  Procedure: COLONOSCOPY WITH PROPOFOL ;  Surgeon: Elicia Claw, MD;  Location: [facility];  Service: Gastroenterology;  Laterality: Bilateral;  ESOPHAGOGASTRODUODENOSCOPY N/A 02/22/2024  Procedure: EGD (ESOPHAGOGASTRODUODENOSCOPY);  Surgeon:  Elicia Claw, MD;  Location[facility] ENDOSCOPY;  Service: Gastroenterology;  Laterality: N/A;  ESOPHAGOGASTRODUODENOSCOPY (EGD) WITH PROPOFOL  Bilateral 10/09/2023  Procedure: ESOPHAGOGASTRODUODENOSCOPY (EGD) WITH PROPOFOL ;  Surgeon: Elicia Claw, MD;  Location: [facility];  Service: Gastroenterology;  Laterality: Bilateral;  HOT HEMOSTASIS N/A 10/09/2023  Procedure: HOT HEMOSTASIS (ARGON PLASMA COAGULATION/BICAP);  Surgeon: Elicia Claw, MD;  Location: [facility];  Service: Gastroenterology;  Laterality: N/A;  KNEE SURGERY Left   LOWER EXTREMITY VENOGRAPHY  01/29/2024  Procedure: LOWER EXTREMITY VENOGRAPHY;  Surgeon: Jordan, Peter M, MD;  Location: [facility] INVASIVE CV LAB;  Service: Cardiovascular;;  RIGHT/LEFT HEART CATH AND CORONARY ANGIOGRAPHY N/A 01/29/2024  Procedure: RIGHT/LEFT HEART CATH AND CORONARY ANGIOGRAPHY;  Surgeon: Jordan, Peter M, MD;  Location: [facility] INVASIVE CV LAB;  Service: Cardiovascular;  Laterality: N/A;  SHOULDER SURGERY    for rotator cuff tear  VENA CAVA UMBRELLA NECK APPROACH Right 02/22/2024  Procedure: INSERTION, UMBRELLA FILTER, INFERIOR VENA CAVA;  Surgeon: Sheree Penne Bruckner, MD;  Location:  [facility] OR;  Service: Vascular;  Laterality: Right; Damien Blumenthal, M.A., CCC-SLP Speech Language Pathology, Acute Rehabilitation Services Secure Chat preferred [phone] 11/19/2024, 11:39 AM  DG CHEST PORT 1 VIEW Result Date: 11/18/2024 EXAM: 1 VIEW(S) XRAY OF THE CHEST 11/18/2024 09:30:00 PM COMPARISON: Portable chest today at 7:04 am. CLINICAL HISTORY: SOB (shortness of breath) FINDINGS: LUNGS AND PLEURA: Consolidation continues to be seen in the left lower lung field. There is a small left pleural effusion, presumably parapneumonic, and a minimal right pleural effusion. Overall aeration seems unchanged. No pneumothorax. HEART AND MEDIASTINUM: Mild cardiomegaly is seen with mild central vascular prominence without visible edema. The mediastinum is normally outlined. BONES AND SOFT TISSUES:  Degenerative change and mild dextroscoliosis of the thoracic spine. IMPRESSION: 1. Consolidation in the left lower lung field with a small left pleural effusion, stable overall aeration. 2. Minimal right pleural effusion. 3. Mild cardiomegaly with mild central vascular prominence without visible edema. Electronically signed by: Francis Quam MD 11/18/2024 09:38 PM EST RP Workstation: [id number]   DG Chest Port 1 View Result Date: 11/18/2024 CLINICAL DATA:  Shortness of breath. EXAM: PORTABLE CHEST 1 VIEW COMPARISON:  11/17/2024 FINDINGS: Patchy airspace disease noted in the left lung base, similar to prior. Small left pleural effusion. Right lung remains clear. Cardiopericardial silhouette is at upper limits of normal for size. No acute bony abnormality. IMPRESSION: Patchy airspace disease at the left base with small left pleural effusion, similar to prior. Electronically Signed   By: Camellia Candle M.D.   On: 11/18/2024 07:32   US  RENAL Result Date: 11/17/2024 EXAM: US  Retroperitoneum Complete, Renal. 11/17/2024 01:54:10 PM TECHNIQUE: Real-time ultrasonography of the retroperitoneum renal was performed. COMPARISON: US  Renal 11/16/2023. CT abdomen and pelvis 11/15/2024. CLINICAL HISTORY: AKI (acute kidney injury). FINDINGS: FINDINGS: RIGHT KIDNEY/URETER: Right kidney measures 11.3 x 5.2 x 4.9 cm. Normal cortical echogenicity. Cyst in the upper pole measuring 2.8 x 3.4 x 2.4 cm. No change since prior study. No imaging follow-up is indicated. No hydronephrosis. No calculus. No mass. LEFT KIDNEY/URETER: Left kidney measures 12.4 x 6.2 x 5.4 cm. Normal cortical echogenicity. Cyst in the mid pole measuring 4.1 x 4.1 x 5.3 cm. No change since prior study. No imaging follow-up is indicated. No hydronephrosis. No calculus. No mass. BLADDER: Surgical absence of the bladder. IMPRESSION: 1. No acute findings. Electronically signed by: Elsie Gravely MD 11/17/2024 09:32 PM EST RP Workstation: [id number]   NM Pulmonary  Perfusion Result Date: 11/17/2024 EXAM: NM Lung Perfusion Scan. CLINICAL HISTORY: Pulmonary embolism (PE) suspected, high prob. TECHNIQUE: Radiolabeled MAA was administered intravenously and planar images of the lungs were obtained in multiple projections. RADIOPHARMACEUTICAL: 4.37 millicurie Technetium Albumin  Aggregated (MAA) injection solution 4.4 millicurie Technetium-48m Albumin  Aggregated. COMPARISON: Chest radiograph 11/17/2024 and CT chest 11/15/2024. FINDINGS: PERFUSION: There is decreased perfusion to the left lower lobe. However, there is severe bronchiectasis and atelectasis within the left lower lobe on comparison CT. No unmatched wedge shaped peripheral perfusion defect to suggest acute pulmonary embolism. IMPRESSION: 1. No evidence of acute pulmonary embolism. 2. Bronchiectasis and atelectasis in the left lower lobe. Electronically signed by: Norleen Boxer MD 11/17/2024 10:41 AM EST RP Workstation: [id number]   DG Chest Port 1 View Result Date: 11/17/2024 EXAM: 1 VIEW(S) XRAY OF THE CHEST 11/17/2024 06:28:00 AM COMPARISON: Chest CT without contrast 11/15/2024. CLINICAL HISTORY: SOB (shortness of breath). FINDINGS: LUNGS AND PLEURA: Left pleural effusion is increased. Infrahilar bronchiectasis in the lower lobes was better seen with CT. The lungs are mildly emphysematous and again  showing scattered airspace disease in the left lung base. No new or worsening infiltrate is evident. No pneumothorax. HEART AND MEDIASTINUM: No acute abnormality of the cardiac and mediastinal silhouettes. BONES AND SOFT TISSUES: Degenerative change and mild dextroscoliosis of the thoracic spine. IMPRESSION: 1. Increased small  left pleural effusion. 2. Mild emphysema with scattered left basilar airspace disease, without new or worsening infiltrate. 3. Infrahilar bilateral cylindrical lower lobe bronchiectasis, better seen on CT. Electronically signed by: Francis Quam MD 11/17/2024 06:49 AM EST RP Workstation: [id number]    CT CHEST ABDOMEN PELVIS WO CONTRAST Result Date: 11/15/2024 CLINICAL DATA:  Clinical concern for sepsis. EXAM: CT CHEST, ABDOMEN AND PELVIS WITHOUT CONTRAST TECHNIQUE: Multidetector CT imaging of the chest, abdomen and pelvis was performed following the standard protocol without IV contrast. RADIATION DOSE REDUCTION: This exam was performed according to the departmental dose-optimization program which includes automated exposure control, adjustment of the mA and/or kV according to patient size and/or use of iterative reconstruction technique. COMPARISON:  01/27/2024. FINDINGS: CT CHEST FINDINGS Cardiovascular: The heart is normal in size and there is a trace pericardial effusion. Multi-vessel coronary artery calcifications are seen. There is atherosclerotic calcification of the aorta without evidence of aneurysm. The pulmonary trunk is normal in caliber. Mediastinum/Nodes: No mediastinal or axillary lymphadenopathy. Evaluation of the hila is limited due to lack of IV contrast. The thyroid  gland, trachea, and esophagus are within normal limits. Lungs/Pleura: Paraseptal and centrilobular emphysematous changes are present in the lungs. There is a stable cylindrical bronchiectasis is noted in the lower lobes bilaterally with patchy airspace disease at the lung bases, greater on the left than the right. There is a small left pleural effusion. 4 mm nodule in the right middle lobe, axial image 89. Musculoskeletal: Degenerative changes are present in the thoracic spine. No acute fracture is seen. CT ABDOMEN PELVIS FINDINGS Hepatobiliary: There is a 9 mm hypodensity in the left lobe of the liver, segment 2 with attenuation compatible with a cyst. Additional subcentimeter hypodensities are present in the liver which are too small to further characterize. The gallbladder is surgically absent. There is a 4.2 cm cyst in the right lobe of the liver, not significantly changed from the prior exam. No biliary ductal dilatation.  Pancreas: Unremarkable. No pancreatic ductal dilatation or surrounding inflammatory changes. Spleen: Normal in size without focal abnormality. Adrenals/Urinary Tract: The adrenal glands are within normal limits. Hypodensities are noted in the kidneys bilaterally, the largest measuring up to 0 7.3 cm on the left, previously 5.5 cm. There is perinephric fat stranding bilaterally, greater on the left than on the right. No renal calculus or hydronephrosis is seen. 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 Stomach/Bowel: The stomach is within normal limits. No bowel obstruction, free air, or pneumatosis is seen. There is a hernia in the upper abdomen in the midline containing nonobstructed transverse colon. A moderate amount of retained stool is noted in the colon. Appendix appears normal. Vascular/Lymphatic: IVC filter is present. Aortic atherosclerosis. No abdominal or pelvic lymphadenopathy is seen. Reproductive: The prostate gland is not seen and may be surgically absent. Other: A small fat containing umbilical hernia is noted Musculoskeletal: Degenerative changes are present in the thoracolumbar spine. Lumbar spinal fusion hardware is noted from L3-S1. No acute osseous abnormality. IMPRESSION: 1. Bilateral lower lobe bronchiectasis with patchy airspace disease at the lung bases, possible atelectasis or infiltrate. 2. Small left pleural effusion. 3. Slightly complex hypodensity in the upper pole of the left kidney measuring 7.3  cm, increased from 5.5 cm on the previous exam. Perinephric fat stranding bilaterally, greater on the left than on the right with a small amount of free fluid in the perinephric space on the left. Differential diagnosis includes ruptured cyst versus infectious or inflammatory process. 4. Emphysema. 5. Moderate amount of retained stool in the colon. 6. Aortic atherosclerosis and coronary artery calcifications. 7. Remaining incidental findings as described above. Electronically  Signed   By: Leita Birmingham M.D.   On: 11/15/2024 14:51   DG Chest Port 1 View Result Date: 11/15/2024 EXAM: 1 VIEW(S) XRAY OF THE CHEST 11/15/2024 12:29:00 PM COMPARISON: 03/01/2024 CLINICAL HISTORY: Questionable sepsis - evaluate for abnormality FINDINGS: LUNGS AND PLEURA: Left base atelectasis. Trace left pleural effusion and/or pleural thickening, unchanged. No pneumothorax. HEART AND MEDIASTINUM: No acute abnormality of the cardiac and mediastinal silhouettes. BONES AND SOFT TISSUES: No acute osseous abnormality. IMPRESSION: 1. Trace left pleural effusion, unchanged. Electronically signed by: Kate Plummer MD 11/15/2024 12:49 PM EST RP Workstation: [id number]    Labs:  CBC: Recent Labs    11/16/24 0305 11/17/24 0314 11/19/24 0339 11/21/24 0406  WBC 26.1* 19.2* 18.6* 15.5*  HGB 10.9* 9.3* 8.9* 8.6*  HCT 37.2* 31.4* 29.1* 28.5*  PLT 229 230 251 298    COAGS: Recent Labs    02/21/24 1755 11/15/24 1208 11/16/24 0305  INR 1.6* 1.3* 1.4*    BMP: Recent Labs    11/18/24 0315 11/19/24 0339 11/20/24 0404 11/21/24 0406  NA 141 142 142 141  K 4.6 4.0 4.2 4.6  CL 110 109 110 109  CO2 21* 23 23 21*  GLUCOSE 104* 132* 110* 108*  BUN 57* 62* 61* 60*  CALCIUM  8.8* 8.6* 8.8* 9.0  CREATININE 2.22* 2.56* 2.60* 2.57*  GFRNONAA 29* 24* 24* 24*    LIVER FUNCTION TESTS: Recent Labs    03/03/24 0444 04/14/24 1351 06/12/24 1230 11/15/24 1208  BILITOT 0.4 0.5 0.3 0.6  AST 17 24 11* 17  ALT 15 19 11 10   ALKPHOS 55 76 90 85  PROT 5.3* 7.4 6.8 6.9  ALBUMIN  2.6* 4.2 4.0 4.1    TUMOR MARKERS: No results for input(s): AFPTM, CEA, CA199, CHROMGRNA in the last 8760 hours.  Assessment and Plan:  Patient approved for ultrasound-guided aspiration only left renal fluid collection by Dr. Jennefer, tentatively 11/21/24.   Patient family has requested IR hold procedure until after ostomy RN sees patient (ostomy has been leaking for several days she states), will touch base with  RN to coordinate.   No contraindications for procedure identified in ROS, physical exam, or review of pre-sedation considerations. INR 1.3.  11/20/24 CT ABD/PEL without contrast imaging available and reviewed:  Enlarging 8.4 x 7.2 cm complex left upper pole renal lesion measuring 24 HU with stable mild left perirenal stranding, which could represent a renal abscess or a complex/hemorrhagic cyst. Given the enlargement over the last 5 days, malignancy seems less likely. With clinical signs of urinary tract infection such as pyuria. Though notable that additional review per Dr. Jennefer suggests increase in size of collection may be more modest than represented above.  VSS, intermittently febrile NPO for procedure Patient takes prophylactic lovenox , last given 1/8 1000. Abx managed by IP team   Risks and benefits of L renal fluid collection aspiration was discussed with the patient's family including, but not limited to bleeding, infection, damage to adjacent structures or low yield requiring additional tests.  All of the questions were answered and there is agreement to proceed.  Consent signed and in chart.   Thank you for allowing our service to participate in Tequan Redmon. 's care.    Electronically Signed: Laymon Coast, NP   11/21/2024, 9:41 AM     I spent a total of 20 Minutes   in face to face in clinical consultation, greater than 50% of which was counseling/coordinating care for image guided L renal aspiration for culture.    (A copy of this note was sent to the referring provider and the time of visit.)  "

## 2024-11-21 NOTE — Consult Note (Signed)
 WOC Nurse ostomy consult note Stoma type/location: RLQ ileal conduit  Consult noted to re- evaluate urostomy, at bedside noted current urostomy appliance is on, clean, intact, with no leaking observed.  Last seen by WOC on 1/6, see note.  Current supplies appropriate.  Discussed with bedside nurse who reports that leaking occurred at the connection point between urostomy and foley bag, which was attached by tape.  Provided nursing with [facility] # [phone] for urostomy bag adapter which should address the concern.  WOC team will not follow patient at this time, please re consult if new needs arise.  Thank you,  Doyal Polite, MSN, RN, [facility] WOC Team [phone] (Available Mon-Fri 0700-1500)

## 2024-11-21 NOTE — Progress Notes (Signed)
 " Progress Note   Patient: Jerry [name]. 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  Patient was febrile on arrival to the ED, diagnosed with sepsis and started on IV antibiotics for infection, likely pulmonary source.   Subjective:  Patient reports he is feeling better, denies abdominal pain, he still febrile 101 over last 24 hours, reports cough and dyspnea much improved.  Assessment and Plan:  Sepsis POA community  acquired pneumonia  Bronchiectasis exacerbation in a patient with history of smoking in the past and evidence of bronchiectasis on imaging -Chest/abdomen/pelvis significant for bronchiectasis with consolidation concerning for pneumonia . - With acute illness, frail, deconditioned and bronchiectasis, with significantly elevated CRP, Pro-Cal and leukocytosis, so antibiotics has been broadened, continue with meropenem , Flagyl  and Zyvox   - Legionella antigen and strep pneumonia negative  Left renal fluid collection (cyst versus abscess) - Patient remains febrile, despite being on appropriate antibiotic coverage, with significant leukocytosis, elevated CRP, imaging on admission with left renal fluid collection concerning for cyst versus abscess, (appears to be increasing in size, was present in 01/2024), discussed with urology, repeat imaging significant for increased in size (no IV contrast due to renal sufficiency), they recommend aspiration by IR, IR consulted.  History of pulmonary embolism   - CT angio chest 02/15/2024 showed bilateral lower lobe PE, has IVC filter.  VQ  scan this admission negative.  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) - patient has history of CKD3b. Baseline creat rose to 2, AKI due to sepsis improved after hydration.  Prediabetes - Last A1c 6.2% on 12/27/2023, Regular diet for now given poor p.o. intake.  PCP to monitor.  Vascular dementia (HCC) and parkinsonism- Continue Xanax  as needed Lexapro , Namenda , Zyprexa , at risk for delirium minimize benzodiazepines and narcotics.  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        Physical Exam: BP (!) 148/69 (BP Location: Right Arm)   Pulse 95   Temp 98.9 F (37.2 C) (Oral)   Resp 20   Ht 5' 11 (1.803 m)   Wt 83.9 kg   SpO2 100%   BMI 25.80 kg/m     Awake Alert, Oriented X 3, frail chronically ill appearing Rales at the bases, but no respiratory distress RRR,No Gallops,Rubs or new Murmurs, No Parasternal Heave +ve B.Sounds, Abd Soft, No tenderness, no CVA tenderness, urostomy present No Cyanosis, Clubbing or edema, No new Rash or bruise       Data Review:   Patient Lines/Drains/Airways Status     Active Line/Drains/Airways     Name Placement date Placement time Site Days   Peripheral IV 11/20/24 22 G 1.75 Anterior;Left;Proximal Forearm 11/20/24  1453  Forearm  1   Urostomy Other (Comment) RLQ --  --  RLQ  --             DVT prophylaxis.  Lovenox .   Family communication.  Updated wife in detail on 11/18/2024 over  the phone.    Inpatient Medications  Scheduled Meds:  (feeding supplement) PROSource Plus  30 mL Oral BID BM   amLODipine   10 mg Oral Daily   escitalopram   10 mg Oral Daily   fluticasone  furoate-vilanterol  1 puff Inhalation Daily   memantine   10 mg Oral BID   sodium chloride  flush  3 mL Intravenous Q12H   Continuous Infusions:  linezolid   (ZYVOX ) IV 600 mg (11/21/24 1011)   meropenem  (MERREM ) IV 1 g (11/21/24 0514)   PRN Meds:.acetaminophen  **OR** acetaminophen , ALPRAZolam , hydrALAZINE , levalbuterol , ondansetron  **OR** ondansetron  (ZOFRAN ) IV, [EXPIRED] sodium chloride  **FOLLOWED BY** sodium chloride   DVT Prophylaxis     Recent Labs  Lab 11/15/24 1208 11/15/24 1232 11/15/24 1240 11/16/24 0305 11/17/24 0314 11/19/24 0339 11/21/24 0406  WBC 24.0*  --   --  26.1* 19.2* 18.6* 15.5*  HGB 10.5*   < > 12.2* 10.9* 9.3* 8.9* 8.6*  HCT 36.6*   < > 36.0* 37.2* 31.4* 29.1* 28.5*  PLT 235  --   --  229 230 251 298  MCV 77.7*  --   --  76.9* 75.5* 73.3* 72.9*  MCH 22.3*  --   --  22.5* 22.4* 22.4* 22.0*  MCHC 28.7*  --   --  29.3* 29.6* 30.6 30.2  RDW 16.0*  --   --  16.3* 16.0* 16.4* 15.9*  LYMPHSABS 1.8  --   --  0.8  --  0.9  --   MONOABS 2.8*  --   --  0.8  --  2.4*  --   EOSABS 0.0  --   --  0.0  --  0.3  --   BASOSABS 0.1  --   --  0.0  --  0.0  --    < > = values in this interval not displayed.    Recent Labs  Lab 11/15/24 1208 11/15/24 1232 11/15/24 1233 11/15/24 1240 11/15/24 1434 11/15/24 1648 11/16/24 0305 11/16/24 1647 11/17/24 0314 11/17/24 0715 11/18/24 0315 11/19/24 0339 11/20/24 0404 11/21/24 0406 11/21/24 0920  NA 138   < >  --    < >  --   --  140  --  139  --  141 142 142 141  --   K 5.4*   < >  --    < >  --   --  5.6*   < > 5.0  --  4.6 4.0 4.2 4.6  --   CL 104  --   --    < >  --   --  106  --  109  --  110 109 110 109  --   CO2 23  --   --   --   --   --  23  --  23  --  21* 23 23 21*  --   ANIONGAP 11  --   --   --   --   --  11  --  7  --  10 9 9 12   --   GLUCOSE 111*  --   --    < >  --   --  151*  --  102*  --  104* 132* 110* 108*  --   BUN 57*  --   --    < >  --   --  55*  --  57*  --  57* 62* 61* 60*  --   CREATININE 2.60*  --   --    < >  --   --  2.34*  --  2.04*  --  2.22* 2.56* 2.60* 2.57*  --   AST 17  --   --   --   --   --   --   --   --   --   --   --   --   --   --    ALT 10  --   --   --   --   --   --   --   --   --   --   --   --   --   --   ALKPHOS 85  --   --   --   --   --   --   --   --   --   --   --   --   --   --   BILITOT 0.6  --   --   --   --   --   --   --   --   --   --   --   --   --   --   ALBUMIN  4.1  --   --   --   --   --   --   --   --   --   --   --   --   --   --   CRP  --   --   --   --   --   --   --   --   --  15.5* 21.8* 25.5* 22.4* 18.6*  --   DDIMER 4.61*  --   --   --   --   --   --   --   --   --   --   --   --   --   --   PROCALCITON  --   --   --   --   --    < > 8.63  --   --   --  4.11 3.02 1.94 1.32  --   LATICACIDVEN  --   --  2.0*  --  1.0  --   --   --   --   --   --   --   --   --   --   INR 1.3*  --   --   --   --   --  1.4*  --   --   --   --   --   --   --  1.3*  MG 2.0  --   --   --   --   --  2.2  --   --   --  2.0 2.1 2.0 2.0  --   PHOS  --   --   --   --   --   --   --   --   --   --  1.6* 3.6 2.6 3.1  --   CALCIUM  9.7  --   --   --   --   --  9.6  --  8.5*  --  8.8* 8.6* 8.8* 9.0  --    < > = values in this interval not displayed.      Recent Labs  Lab 11/15/24 1208 11/15/24 1233 11/15/24 1434 11/15/24 1648 11/16/24 0305 11/17/24 9685 11/17/24 0715 11/18/24 0315 11/19/24 9660 11/20/24 0404 11/21/24 0406 11/21/24 0920  CRP  --   --   --   --   --   --  15.5* 21.8* 25.5* 22.4* 18.6*  --   DDIMER 4.61*  --   --   --   --   --   --   --   --   --   --   --   PROCALCITON  --   --   --    < > 8.63  --   --  4.11 3.02 1.94 1.32  --   LATICACIDVEN  --  2.0* 1.0  --   --   --   --   --   --   --   --   --   INR 1.3*  --   --   --  1.4*  --   --   --   --   --   --  1.3*  MG 2.0  --   --   --  2.2  --   --  2.0 2.1 2.0 2.0  --   CALCIUM  9.7  --   --   --  9.6 8.5*  --  8.8* 8.6* 8.8* 9.0  --    < > = values in this interval not displayed.    --------------------------------------------------------------------------------------------------------------- Lab Results  Component Value Date   CHOL 119  01/29/2024   HDL 61 01/29/2024   LDLCALC 39 01/29/2024   TRIG 94 01/29/2024   CHOLHDL 2.0 01/29/2024    Lab Results  Component Value Date   HGBA1C 6.2 (H) 12/27/2023   No results for input(s): TSH, T4TOTAL, FREET4, T3FREE, THYROIDAB in the last 72 hours. No results for input(s): VITAMINB12, FOLATE, FERRITIN, TIBC, IRON , RETICCTPCT in the last 72 hours. ------------------------------------------------------------------------------------------------------------------ Cardiac Enzymes No results for input(s): CKMB, TROPONINI, MYOGLOBIN in the last 168 hours.  Invalid input(s): CK  Micro Results Recent Results (from the past 240 hours)  Blood Culture (routine x 2)     Status: None   Collection Time: 11/15/24 12:08 PM   Specimen: BLOOD RIGHT HAND  Result Value Ref Range Status   Specimen Description BLOOD RIGHT HAND  Final   Special Requests   Final    BOTTLES DRAWN AEROBIC AND ANAEROBIC Blood Culture results may not be optimal due to an inadequate volume of blood received in culture bottles   Culture   Final    NO GROWTH 5 DAYS Performed at [facility], [address], [place], [place] [address]    Report Status 11/20/2024 FINAL  Final  Blood Culture (routine x 2)     Status: None   Collection Time: 11/15/24 12:13 PM   Specimen: BLOOD LEFT HAND  Result Value Ref Range Status   Specimen Description BLOOD LEFT HAND  Final   Special Requests   Final    BOTTLES DRAWN AEROBIC AND ANAEROBIC Blood Culture adequate volume   Culture   Final    NO GROWTH 5 DAYS Performed at [facility], [address], [place], [place] [address]    Report Status 11/20/2024 FINAL  Final  Resp panel by RT-PCR (RSV, Flu A&B, Covid) Anterior Nasal Swab     Status: None   Collection Time: 11/15/24 12:46 PM   Specimen: Anterior Nasal Swab  Result Value Ref Range Status   SARS Coronavirus 2 by RT PCR NEGATIVE NEGATIVE Final   Influenza A by PCR NEGATIVE NEGATIVE  Final   Influenza B by PCR NEGATIVE NEGATIVE Final    Comment: (NOTE) The Xpert Xpress SARS-CoV-2/FLU/RSV plus assay is intended as an aid in the diagnosis of  influenza from Nasopharyngeal swab specimens and should not be used as a sole basis for treatment. Nasal washings and aspirates are unacceptable for Xpert Xpress SARS-CoV-2/FLU/RSV testing.  Fact Sheet for Patients: bloggercourse.com  Fact Sheet for Healthcare Providers: seriousbroker.it  This test is not yet approved or cleared by the [place]  FDA and has been authorized for detection and/or diagnosis of SARS-CoV-2 by FDA under an Emergency Use Authorization (EUA). This EUA will remain in effect (meaning this test can be used) for the duration of the COVID-19 declaration under Section 564(b)(1) of the Act, 21 U.S.C. section 360bbb-3(b)(1), unless the authorization is terminated or revoked.     Resp Syncytial Virus by PCR NEGATIVE NEGATIVE Final    Comment: (NOTE) Fact Sheet for Patients: bloggercourse.com  Fact Sheet for Healthcare Providers: seriousbroker.it  This test is not yet approved or cleared by the [place]  FDA and has been authorized for detection and/or diagnosis of SARS-CoV-2 by FDA under an Emergency Use Authorization (EUA). This EUA will remain in effect (meaning this test can be used) for the duration of the COVID-19 declaration under Section 564(b)(1) of the Act, 21 U.S.C. section 360bbb-3(b)(1), unless the authorization is terminated or revoked.  Performed at [facility], [address], [place], [place] [address]   Respiratory (~20 pathogens) panel by PCR     Status: None   Collection Time: 11/15/24 12:46 PM   Specimen: Nasopharyngeal Swab; Respiratory  Result Value Ref Range Status   Adenovirus NOT DETECTED NOT DETECTED Final   Coronavirus 229E NOT DETECTED NOT DETECTED Final     Comment: (NOTE) The Coronavirus on the Respiratory Panel, DOES NOT test for the novel  Coronavirus (2019 nCoV)    Coronavirus HKU1 NOT DETECTED NOT DETECTED Final   Coronavirus NL63 NOT DETECTED NOT DETECTED Final   Coronavirus OC43 NOT DETECTED NOT DETECTED Final   Metapneumovirus NOT DETECTED NOT DETECTED Final   Rhinovirus / Enterovirus NOT DETECTED NOT DETECTED Final   Influenza A NOT DETECTED NOT DETECTED Final   Influenza B NOT DETECTED NOT DETECTED Final   Parainfluenza Virus 1 NOT DETECTED NOT DETECTED Final   Parainfluenza Virus 2 NOT DETECTED NOT DETECTED Final   Parainfluenza Virus 3 NOT DETECTED NOT DETECTED Final   Parainfluenza Virus 4 NOT DETECTED NOT DETECTED Final   Respiratory Syncytial Virus NOT DETECTED NOT DETECTED Final   Bordetella pertussis NOT DETECTED NOT DETECTED Final   Bordetella Parapertussis NOT DETECTED NOT DETECTED Final   Chlamydophila pneumoniae NOT DETECTED NOT DETECTED Final   Mycoplasma pneumoniae NOT DETECTED NOT DETECTED Final    Comment: Performed at [facility], [address], [place], [place] [address]  MRSA Next Gen by PCR, Nasal     Status: None   Collection Time: 11/16/24 12:40 PM   Specimen: Nasal Mucosa; Nasal Swab  Result Value Ref Range Status   MRSA by PCR Next Gen NOT DETECTED NOT DETECTED Final    Comment: (NOTE) The GeneXpert MRSA Assay (FDA approved for NASAL specimens only), is one component of a comprehensive MRSA colonization surveillance program. It is not intended to diagnose MRSA infection nor to guide or monitor treatment for MRSA infections. Test performance is not FDA approved in patients less than 42 years old. Performed at [facility], [address], [place], [place] [address]   Respiratory (~20 pathogens) panel by PCR     Status: None   Collection Time: 11/18/24  5:28 AM   Specimen: Nasopharyngeal Swab; Respiratory  Result Value Ref Range Status   Adenovirus NOT DETECTED NOT DETECTED Final    Coronavirus 229E NOT DETECTED NOT DETECTED Final    Comment: (NOTE) The Coronavirus on the Respiratory Panel, DOES NOT test for the novel  Coronavirus (2019 nCoV)    Coronavirus HKU1 NOT DETECTED NOT DETECTED Final   Coronavirus NL63 NOT DETECTED NOT DETECTED Final   Coronavirus OC43 NOT DETECTED NOT DETECTED Final   Metapneumovirus NOT DETECTED NOT DETECTED Final   Rhinovirus / Enterovirus NOT DETECTED NOT DETECTED Final   Influenza A NOT DETECTED NOT DETECTED Final   Influenza B NOT DETECTED NOT DETECTED Final   Parainfluenza Virus 1 NOT DETECTED NOT DETECTED Final   Parainfluenza Virus 2 NOT DETECTED NOT DETECTED Final   Parainfluenza Virus 3 NOT DETECTED NOT DETECTED Final   Parainfluenza Virus 4 NOT DETECTED NOT DETECTED Final   Respiratory Syncytial Virus NOT DETECTED NOT DETECTED Final   Bordetella pertussis NOT DETECTED NOT DETECTED Final   Bordetella Parapertussis NOT DETECTED NOT DETECTED Final   Chlamydophila pneumoniae NOT DETECTED NOT DETECTED Final   Mycoplasma pneumoniae NOT DETECTED NOT DETECTED Final    Comment: Performed at [facility], [address] [place], [place] [address]    Radiology Reports  CT ABDOMEN PELVIS WO CONTRAST Result Date: 11/20/2024 EXAM: CT ABDOMEN AND PELVIS WITHOUT CONTRAST 11/20/2024 02:16:11 PM TECHNIQUE: CT of the abdomen and pelvis was performed without the administration of intravenous contrast. Multiplanar reformatted images are provided for review. Automated exposure control, iterative reconstruction, and/or weight-based adjustment of the mA/kV was utilized to reduce the radiation dose to as low as reasonably achievable. COMPARISON: 11/15/2024 CLINICAL HISTORY: Fever, left renal cyst versus abscess. Renal insufficiency. FINDINGS: LOWER CHEST: Mild cardiomegaly. Small pericardial effusion. Trace left pleural effusion with mild atelectasis posteriorly in the left lower lobe and in the posterobasal segment right lower lobe. Cylindrical  bronchiectasis in both lower lobes with associated airway plugging. LIVER: Stable 4 mm fluid density lesion inferiorly in the right hepatic lobe on image 26 series 4. Faint calcifications along the margin of this lesion which is partially exophytic. Other tiny hypodense lesions are scattered in the liver and similar to prior. GALLBLADDER AND BILE DUCTS: Cholecystectomy. No biliary ductal dilatation. SPLEEN: No acute abnormality. PANCREAS: No acute abnormality. ADRENAL GLANDS: No acute abnormality. KIDNEYS, URETERS AND BLADDER: Hypodense lesions are present in both kidneys compatible with benign cysts. There is also a complex 8.4 x 7.2 cm lesion of the left kidney upper pole, internal density 24 HU, previously measuring 7.5 x 6.4 cm. This could be a complex cyst, an abscess, or a mass. There is some faint left perirenal stranding although not increased from 11/15/2024. Right lower quadrant ileostomy. Right abdominal ileal conduit noted. The urinary bladder and prostate gland appear to be absent. No stones in the kidneys or ureters. No hydronephrosis. No perinephric or periureteral stranding. GI AND BOWEL: Stomach demonstrates no acute abnormality. Prior administered contrast medium extends to the rectum. A supraumbilical hernia contains adipose tissue and a margin of the transverse colon as shown on image 83 series 8. There is no bowel obstruction. PERITONEUM AND RETROPERITONEUM: No ascites. No free air. VASCULATURE: Aorta is normal in caliber. Infrarenal IVC filter noted. Systemic atherosclerosis is present, including the aorta and iliac arteries. LYMPH NODES: Small retroperitoneal lymph nodes are not pathologically enlarged but may be reactive. REPRODUCTIVE ORGANS: Prostate gland appears to be absent. BONES AND SOFT TISSUES: Posterolateral rod and pedicle screw fixation at L3-L4, L4-L5, and  L5-S1 with associated posterior decompression spanning from L2-L3 through L5. Interbody space artifact at diffused levels. A  supraumbilical hernia contains adipose tissue and a margin of the transverse colon as shown on image 83 series 8. No acute osseous abnormality. No focal soft tissue abnormality. IMPRESSION: 1. Enlarging 8.4 x 7.2 cm complex left upper pole renal lesion measuring 24 HU with stable mild left perirenal stranding, which could represent a renal abscess or a complex/hemorrhagic cyst. Given the enlargement over the last 5 days, malignancy seems less likely. With clinical signs of urinary tract infection such as pyuria. 2. Small retroperitoneal lymph nodes which are not pathologically enlarged and may be reactive. 3. Additional chronic and incidental findings include bilateral renal cysts, mild cardiomegaly with small pericardial effusion, trace left pleural effusion with mild bibasilar atelectasis and lower lobe bronchiectasis with airway plugging, stable hepatic lesions, cholecystectomy, right lower quadrant ileostomy and right abdominal ileal conduit with absent bladder and prostate, supraumbilical hernia containing fat and a portion of transverse colon, infrarenal IVC filter, systemic atherosclerosis, and postsurgical lumbar fusion and decompression changes as described in the report body. Electronically signed by: Ryan Salvage MD MD 11/20/2024 02:51 PM EST RP Workstation: [id number]      Signature  -   Brayton Deylan Canterbury M.D on 11/21/2024 at 1:16 PM   -  To page go to [email or web address]        "

## 2024-11-21 NOTE — Consult Note (Signed)
 "   Urology Consult Note   Requesting Attending Physician:  Sherlon Brayton RAMAN, MD Service Providing Consult: Urology  Consulting Attending: Dr. Alvaro   Reason for Consult: Refractory pyelonephritis with possible ruptured left renal cyst  HPI: Jerry JONETTA Secundino Mickey. is seen in consultation for reasons noted above at the request of Elgergawy, Brayton RAMAN, MD. Patient is a 84 y.o. male presenting  [facility] emergency department febrile, diagnosed with sepsis and started on antibiotics, presumed to be pulmonary source at the time.  Patient is known to our practice, s/[place]state[place] in [place] around 2017.  Followed by Dr. Alvaro for annual surveillance.  CT imaging noted significant bilateral perinephric stranding with left renal fluid collections concerning for cyst versus abscess.  Renal cyst had been in place for quite some time but there was some increase in size.  Due to patient continuing to fever and clinically worsening while having received an adequate course of broad antibiotics, urology was consulted to review his imaging and speak to CT findings.  On my arrival yesterday patient was alert, oriented, no distress.  He was relatively lethargic but was amenable to the case and plan.  Imaging at time of consult was quite old and would not give us  a good picture as to whether he had developing abscess.  Repeat scan was ordered and I revisited patient today.  He was already in interventional radiology on my arrival but I reviewed the case and plan with his wife as well. 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  Question of left renal cyst versus abscess.  Longstanding renal cysts per previous imaging   Recommendations: # Left renal cyst versus abscess  CT imaging initially measured 7.5 x 6.4 cm on 11/15/2024, interval growth to 8.4 x 7.2 cm on reimaging 11/20/2024.  Dimensional radiology was consulted for aspiration/drain placement and around 300 mL  evacuated.  Cysts present on imaging for quite some time and less likely to be the culprit.  Agree with continued broad ABX while awaiting culture results.  Typically repeat imaging and consider drain removal after JP output is less than 10 mL/24h  Urology will follow peripherally   Case and plan discussed with Dr. 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   Malnutrition of moderate degree 06/28/2023   Mixed hyperlipidemia 07/21/2021   OSA (obstructive sleep apnea) 07/12/2021   NPSG- 03/16/21- AHI 10.3/ hr, desaturation to 87%, body weight 190 lbs     Renal insufficiency 04/17/2015   Syncope 04/17/2015   Tremor 07/21/2021   Vascular dementia (HCC)    Vitamin D  deficiency 07/21/2021    Past Surgical History:  Past Surgical History:  Procedure Laterality Date   BACK SURGERY     BIOPSY  10/09/2023   Procedure: BIOPSY;  Surgeon: Elicia Claw, MD;  Location: [facility];  Service: Gastroenterology;;   CHOLECYSTECTOMY     COLONOSCOPY WITH PROPOFOL  Bilateral 10/09/2023   Procedure: COLONOSCOPY WITH PROPOFOL ;  Surgeon: Elicia Claw, MD;  Location: [facility];  Service: Gastroenterology;  Laterality: Bilateral;   ESOPHAGOGASTRODUODENOSCOPY N/A 02/22/2024   Procedure: EGD (ESOPHAGOGASTRODUODENOSCOPY);  Surgeon: Elicia Claw, MD;  Location: [facility] ENDOSCOPY;  Service: Gastroenterology;  Laterality: N/A;   ESOPHAGOGASTRODUODENOSCOPY (EGD) WITH PROPOFOL  Bilateral 10/09/2023    Procedure: ESOPHAGOGASTRODUODENOSCOPY (EGD) WITH PROPOFOL ;  Surgeon: Elicia Claw, MD;  Location: [facility];  Service: Gastroenterology;  Laterality: Bilateral;   HOT HEMOSTASIS N/A 10/09/2023   Procedure: HOT HEMOSTASIS (ARGON PLASMA COAGULATION/BICAP);  Surgeon: Elicia Claw, MD;  Location: [facility];  Service: Gastroenterology;  Laterality: N/A;   IR US  GUIDE BX ASP/DRAIN  11/21/2024   KNEE SURGERY Left    LOWER EXTREMITY VENOGRAPHY  01/29/2024   Procedure: LOWER EXTREMITY VENOGRAPHY;  Surgeon: Jordan, Peter M, MD;  Location: [facility] INVASIVE CV LAB;  Service: Cardiovascular;;   RIGHT/LEFT HEART CATH AND CORONARY ANGIOGRAPHY N/A 01/29/2024   Procedure: RIGHT/LEFT HEART CATH AND CORONARY ANGIOGRAPHY;  Surgeon: Jordan, Peter M, MD;  Location: [facility] INVASIVE CV LAB;  Service: Cardiovascular;  Laterality: N/A;   SHOULDER SURGERY     for rotator cuff tear   VENA CAVA UMBRELLA NECK APPROACH Right 02/22/2024   Procedure: INSERTION, UMBRELLA FILTER, INFERIOR VENA CAVA;  Surgeon: Sheree Penne Bruckner, MD;  Location: [facility] OR;  Service: Vascular;  Laterality: Right;    Medication: Current Facility-Administered Medications  Medication Dose Route Frequency Provider Last Rate Last Admin   (feeding supplement) PROSource Plus liquid 30 mL  30 mL Oral BID BM Singh, Prashant K, MD   30 mL at 11/20/24 1359   acetaminophen  (TYLENOL ) tablet 650 mg  650 mg Oral Q6H PRN Patsy Lenis, MD   650 mg at 11/21/24 9486   Or   acetaminophen  (TYLENOL ) suppository 650 mg  650 mg Rectal Q6H PRN Patsy Lenis, MD       ALPRAZolam  (XANAX ) tablet 0.5 mg  0.5 mg Oral BID PRN Patsy Lenis, MD   0.5 mg at 11/19/24 2026   amLODipine  (NORVASC ) tablet 10 mg  10 mg Oral Daily Singh, Prashant K, MD   10 mg at 11/21/24 1013   escitalopram  (LEXAPRO ) tablet 10 mg  10 mg Oral Daily Patsy Lenis, MD   10 mg at 11/20/24 2125   fluticasone  furoate-vilanterol (BREO ELLIPTA ) 200-25 MCG/ACT 1 puff  1 puff Inhalation Daily Gretel Prentice BIRCH, RPH   1 puff at 11/21/24 9082   hydrALAZINE  (APRESOLINE ) injection 10 mg  10 mg Intravenous Q6H PRN Singh, Prashant K, MD       levalbuterol  (XOPENEX ) nebulizer solution 0.63 mg  0.63 mg Nebulization Q6H PRN Sundil, Subrina, MD   0.63 mg at 11/18/24 2101   linezolid  (ZYVOX ) IVPB 600 mg  600 mg Intravenous Q12H Sundil, Subrina, MD 300 mL/hr at 11/21/24 1011 600 mg at 11/21/24 1011   memantine  (NAMENDA ) tablet 10 mg  10 mg Oral BID Patsy Lenis, MD   10 mg at 11/21/24 1013   meropenem  (MERREM ) 1 g in sodium chloride  0.9 % 100 mL IVPB  1 g Intravenous Q12H Mattie Marvetta SQUIBB, RPH 200 mL/hr at 11/21/24 0514 1 g at 11/21/24 0514   ondansetron  (ZOFRAN ) tablet 4 mg  4 mg Oral Q6H PRN Patsy Lenis, MD       Or   ondansetron  (ZOFRAN ) injection 4 mg  4 mg Intravenous Q6H PRN Patsy Lenis, MD       sodium chloride  (OCEAN) 0.65 % nasal spray 2 spray  2 spray Each Nare PRN Elgergawy, Brayton RAMAN, MD  sodium chloride  flush (NS) 0.9 % injection 3 mL  3 mL Intravenous Q12H Girguis, David, MD   3 mL at 11/21/24 1012    Allergies: Allergies[1]  Social History: Social History[2]  Family History Family History  Problem Relation Age of Onset   Diabetes Mother    Diabetes Father     Review of Systems  Genitourinary:  Negative for dysuria, flank pain, frequency, hematuria and urgency.     Objective   Vital signs in last 24 hours: BP (!) 140/71 (BP Location: Right Arm)   Pulse (!) 108   Temp 98 F (36.7 C) (Oral)   Resp 20   Ht 5' 11 (1.803 m)   Wt 83.9 kg   SpO2 98%   BMI 25.80 kg/m   Physical Exam General: A&O, resting, appropriate HEENT: [place]/AT Pulmonary: Normal work of breathing Cardiovascular: no cyanosis GU: Ileal conduit draining clear yellow urine   Most Recent Labs: Lab Results  Component Value Date   WBC 15.5 (H) 11/21/2024   HGB 8.6 (L) 11/21/2024   HCT 28.5 (L) 11/21/2024   PLT 298 11/21/2024    Lab Results  Component Value Date   NA 141 11/21/2024    K 4.6 11/21/2024   CL 109 11/21/2024   CO2 21 (L) 11/21/2024   BUN 60 (H) 11/21/2024   CREATININE 2.57 (H) 11/21/2024   CALCIUM  9.0 11/21/2024   MG 2.0 11/21/2024   PHOS 3.1 11/21/2024    Lab Results  Component Value Date   INR 1.3 (H) 11/21/2024     Urine Culture: @[id number] (laburin,org,[id number],[id number])@   IMAGING: IR US  ABSCESS DRAIN PLACEMENT Result Date: 11/21/2024 INDICATION: 84 year old male with history of sepsis and suspected etiology of enlarging left renal fluid collection. EXAM: Ultrasound-guided left renal abscess drain placement MEDICATIONS: The patient is currently admitted to the hospital and receiving intravenous antibiotics. The antibiotics were administered within an appropriate time frame prior to the initiation of the procedure. ANESTHESIA/SEDATION: Fentanyl  25 mcg IV; Versed  0.5 mg IV Moderate Sedation Time:  13 The patient was continuously monitored during the procedure by the interventional radiology nurse under my direct supervision. COMPLICATIONS: None immediate. PROCEDURE: Informed written consent was obtained from the patient after a thorough discussion of the procedural risks, benefits and alternatives. All questions were addressed. Maximal Sterile Barrier Technique was utilized including caps, mask, sterile gowns, sterile gloves, sterile drape, hand hygiene and skin antiseptic. A timeout was performed prior to the initiation of the procedure. Preprocedure ultrasound evaluation demonstrated a mildly complex ovoid fluid collection about the left kidney. The procedure was planned. Subdermal Local anesthesia was administered at the planned needle entry site. Under direct ultrasound visualization, local anesthetic was administered to the periphery of the lesion. A small skin nick was made. Under direct ultrasound visualization, an 18 gauge trocar needle was directed into the central aspect of the collection. Aspiration was performed which yielded purulent fluid. Given  suspected infection in size of the collection, the decision was made to place a drain. Therefore, an Amplatz wire was inserted through the needle and coiled in the collection. After serial dilation was performed, an 8 French skater drain was then placed with the pigtail portion coiled in the central aspect of the collection. Approximately 300 mL of purulent fluid was aspirated. A sample was sent to the lab for culture. The drain was placed to bulb suction. The drain was affixed to the skin with a 0 silk interrupted suture. A sterile bandage was applied. The patient tolerated the procedure well  was transferred back to the floor in good condition. IMPRESSION: Technically successful ultrasound-guided left renal abscess 8 French drain placement. Ester Sides, MD [facility] Electronically Signed   By: Ester Sides M.D.   On: 11/21/2024 15:21   CT ABDOMEN PELVIS WO CONTRAST Result Date: 11/20/2024 EXAM: CT ABDOMEN AND PELVIS WITHOUT CONTRAST 11/20/2024 02:16:11 PM TECHNIQUE: CT of the abdomen and pelvis was performed without the administration of intravenous contrast. Multiplanar reformatted images are provided for review. Automated exposure control, iterative reconstruction, and/or weight-based adjustment of the mA/kV was utilized to reduce the radiation dose to as low as reasonably achievable. COMPARISON: 11/15/2024 CLINICAL HISTORY: Fever, left renal cyst versus abscess. Renal insufficiency. FINDINGS: LOWER CHEST: Mild cardiomegaly. Small pericardial effusion. Trace left pleural effusion with mild atelectasis posteriorly in the left lower lobe and in the posterobasal segment right lower lobe. 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 LIVER: Stable 4 mm fluid density lesion inferiorly in the right hepatic lobe on image 26 series 4. Faint calcifications along the margin of this lesion which is partially exophytic. Other  tiny hypodense lesions are scattered in the liver and similar to prior. GALLBLADDER AND BILE DUCTS: Cholecystectomy. No biliary ductal dilatation. SPLEEN: No acute abnormality. PANCREAS: No acute abnormality. ADRENAL GLANDS: No acute abnormality. KIDNEYS, URETERS AND BLADDER: Hypodense lesions are present in both kidneys compatible with benign cysts. There is also a complex 8.4 x 7.2 cm lesion of the left kidney upper pole, internal density 24 HU, previously measuring 7.5 x 6.4 cm. This could be a complex cyst, an abscess, or a mass. There is some faint left perirenal stranding although not increased from 11/15/2024. Right lower quadrant ileostomy. Right abdominal ileal conduit noted. The urinary bladder and prostate gland appear to be absent. No stones in the kidneys or ureters. No hydronephrosis. No perinephric or periureteral stranding. GI AND BOWEL: Stomach demonstrates no acute abnormality. Prior administered contrast medium extends to the rectum. A supraumbilical hernia contains adipose tissue and a margin of the transverse colon as shown on image 83 series 8. There is no bowel obstruction. PERITONEUM AND RETROPERITONEUM: No ascites. No free air. VASCULATURE: Aorta is normal in caliber. Infrarenal IVC filter noted. Systemic atherosclerosis is present, including the aorta and iliac arteries. LYMPH NODES: Small retroperitoneal lymph nodes are not pathologically enlarged but may be reactive. REPRODUCTIVE ORGANS: Prostate gland appears to be absent. BONES AND SOFT TISSUES: Posterolateral rod and pedicle screw fixation at L3-L4, L4-L5, and L5-S1 with associated posterior decompression spanning from L2-L3 through L5. Interbody space artifact at diffused levels. A supraumbilical hernia contains adipose tissue and a margin of the transverse colon as shown on image 83 series 8. No acute osseous abnormality. No focal soft tissue abnormality. IMPRESSION: 1. Enlarging 8.4 x 7.2 cm complex left upper pole renal lesion  measuring 24 HU with stable mild left perirenal stranding, which could represent a renal abscess or a complex/hemorrhagic cyst. Given the enlargement over the last 5 days, malignancy seems less likely. With clinical signs of urinary tract infection such as pyuria. 2. Small retroperitoneal lymph nodes which are not pathologically enlarged and may be reactive. 3. Additional chronic and incidental findings include bilateral renal cysts, mild cardiomegaly with small pericardial effusion, trace left pleural effusion with mild bibasilar atelectasis and lower lobe bronchiectasis with airway plugging, stable hepatic lesions, cholecystectomy, right lower quadrant ileostomy and right abdominal ileal conduit with absent bladder and prostate, supraumbilical hernia containing fat and a portion of  transverse colon, infrarenal IVC filter, systemic atherosclerosis, and postsurgical lumbar fusion and decompression changes as described in the report body. Electronically signed by: Ryan Salvage MD MD 11/20/2024 02:51 PM EST RP Workstation: [id number]    ------  Ole Bourdon, NP Pager: [phone]   Please contact the urology consult pager with any further questions/concerns.     [1] No Known Allergies [2]  Social History Tobacco Use   Smoking status: Never   Smokeless tobacco: Never  Vaping Use   Vaping status: Never Used  Substance Use Topics   Alcohol use: Yes    Alcohol/week: 3.0 standard drinks of alcohol    Types: 3 Standard drinks or equivalent per week   Drug use: Yes    Types: Marijuana   "

## 2024-11-21 NOTE — TOC Progression Note (Signed)
 Transition of Care (TOC) - Progression Note    Patient Details  Name: Jerry [name]. MRN: [id number] Date of Birth: [birth year]  Transition of Care [facility]) CM/SW Contact  Inocente GORMAN Kindle, LCSW Phone Number: 11/21/2024, 8:58 AM  Clinical Narrative:    Spouse was able to tour [facility] yesterday and her other daughter is coming to town today. [facility] not able to accept over the weekend so anticipating Monday discharge pending medical stability.    Expected Discharge Plan: Skilled Nursing Facility Barriers to Discharge: Continued Medical Work up              Expected Discharge Plan and Services In-house Referral: Clinical Social Work   Post Acute Care Choice: Skilled Nursing Facility Living arrangements for the past 2 months: Single Family Home                                       Social Drivers of Health (SDOH) Interventions SDOH Screenings   Food Insecurity: No Food Insecurity (11/16/2024)  Housing: Low Risk (11/16/2024)  Transportation Needs: No Transportation Needs (11/16/2024)  Utilities: Not At Risk (11/16/2024)  Depression (PHQ2-9): Low Risk (04/14/2024)  Social Connections: Moderately Isolated (11/16/2024)  Tobacco Use: Low Risk (11/15/2024)    Readmission Risk Interventions    11/18/2024    3:02 PM 02/18/2024    2:20 PM 06/27/2023   12:12 PM  Readmission Risk Prevention Plan  Transportation Screening Complete Complete Complete  PCP or Specialist Appt within 5-7 Days   Complete  PCP or Specialist Appt within 3-5 Days  Complete   Home Care Screening   Complete  Medication Review (RN CM)   Complete  HRI or Home Care Consult  Complete   Palliative Care Screening  Not Applicable   Medication Review (RN Care Manager) Complete Complete   PCP or Specialist appointment within 3-5 days of discharge Complete    HRI or Home Care Consult Complete    SW Recovery Care/Counseling Consult Complete    Palliative Care Screening Not Applicable    Skilled Nursing Facility Complete

## 2024-11-22 ENCOUNTER — Inpatient Hospital Stay (HOSPITAL_COMMUNITY)

## 2024-11-22 DIAGNOSIS — A419 Sepsis, unspecified organism: Secondary | ICD-10-CM | POA: Diagnosis not present

## 2024-11-22 DIAGNOSIS — R6520 Severe sepsis without septic shock: Secondary | ICD-10-CM | POA: Diagnosis not present

## 2024-11-22 LAB — BASIC METABOLIC PANEL WITH GFR
Anion gap: 10 (ref 5–15)
BUN: 54 mg/dL — ABNORMAL HIGH (ref 8–23)
CO2: 21 mmol/L — ABNORMAL LOW (ref 22–32)
Calcium: 8.7 mg/dL — ABNORMAL LOW (ref 8.9–10.3)
Chloride: 109 mmol/L (ref 98–111)
Creatinine, Ser: 2.28 mg/dL — ABNORMAL HIGH (ref 0.61–1.24)
GFR, Estimated: 28 mL/min — ABNORMAL LOW
Glucose, Bld: 103 mg/dL — ABNORMAL HIGH (ref 70–99)
Potassium: 4.3 mmol/L (ref 3.5–5.1)
Sodium: 140 mmol/L (ref 135–145)

## 2024-11-22 LAB — CBC
HCT: 29.1 % — ABNORMAL LOW (ref 39.0–52.0)
Hemoglobin: 8.9 g/dL — ABNORMAL LOW (ref 13.0–17.0)
MCH: 22.1 pg — ABNORMAL LOW (ref 26.0–34.0)
MCHC: 30.6 g/dL (ref 30.0–36.0)
MCV: 72.4 fL — ABNORMAL LOW (ref 80.0–100.0)
Platelets: 340 K/uL (ref 150–400)
RBC: 4.02 MIL/uL — ABNORMAL LOW (ref 4.22–5.81)
RDW: 16 % — ABNORMAL HIGH (ref 11.5–15.5)
WBC: 17.3 K/uL — ABNORMAL HIGH (ref 4.0–10.5)
nRBC: 0 % (ref 0.0–0.2)

## 2024-11-22 LAB — C-REACTIVE PROTEIN: CRP: 18 mg/dL — ABNORMAL HIGH

## 2024-11-22 LAB — PROCALCITONIN: Procalcitonin: 0.9 ng/mL

## 2024-11-22 MED ORDER — ENOXAPARIN SODIUM 30 MG/0.3ML IJ SOSY
30.0000 mg | PREFILLED_SYRINGE | Freq: Every day | INTRAMUSCULAR | Status: DC
  Administered 2024-11-22 – 2024-11-26 (×5): 30 mg via SUBCUTANEOUS
  Filled 2024-11-22 (×5): qty 0.3

## 2024-11-22 NOTE — Progress Notes (Signed)
 " Progress Note   Patient: Jerry [name]. 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  Patient was febrile on arrival to the ED, diagnosed with sepsis and started on IV antibiotics for infection, likely pulmonary source.   Subjective:  No nausea, no vomiting, fever 101.7 overnight, has 100 cc output from his left kidney drain.    Assessment and Plan:  Sepsis POA community  acquired pneumonia  Bronchiectasis exacerbation in a patient with history of smoking in the past and evidence of bronchiectasis on imaging -Chest/abdomen/pelvis significant for bronchiectasis with consolidation concerning for pneumonia . - With acute illness, frail, deconditioned and bronchiectasis, with significantly elevated CRP, Pro-Cal and leukocytosis, so antibiotics has been broadened, treated  with meropenem , Flagyl  and Zyvox , continue with meropenem , stop Zyvox  and Flagyl , please see discussion below - Legionella antigen and strep pneumonia negative  Left renal fluid collection (cyst versus abscess) - Patient remains febrile, despite being on appropriate antibiotic coverage, with significant leukocytosis, elevated CRP, imaging on admission with left renal fluid collection concerning for cyst versus abscess, (appears to be increasing in size, was present in 01/2024), discussed with urology, repeat imaging significant for increased in size (no IV contrast due to renal sufficiency), they recommend aspiration by IR, IR consulted.  This post ultrasound-guided drain insertion, 300 cc with immediate output, 100 cc  output overnight, many white blood cells, prelim culture showing Klebsiella.  So stop Zyvox  and Flagyl  and continue with meropenem  for now  Fever - Remains with persistent fever, most likely due to above, but I will obtain venous Dopplers meanwhile.  History of pulmonary embolism   - CT angio chest 02/15/2024 showed bilateral lower lobe PE, has IVC filter.  VQ scan this admission negative.  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) - patient has history of CKD3b. Baseline creat rose to 2, AKI due to sepsis improved after hydration.  Prediabetes - Last A1c 6.2% on 12/27/2023, Regular diet for now given poor p.o. intake.  PCP to monitor.  Vascular dementia (HCC) and parkinsonism- Continue Xanax  as needed Lexapro , Namenda , Zyprexa , at risk for delirium minimize benzodiazepines and narcotics.  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        Physical Exam: BP 117/65 (BP Location: Right Arm)   Pulse 92   Temp 98 F (36.7 C) (Axillary)   Resp (!) 27   Ht 5' 11 (1.803 m)   Wt 83.9 kg   SpO2 93%   BMI 25.80 kg/m     Awake, alert, oriented x 3, frail Good air entry, scattered Rales RRR,No Gallops,Rubs or new Murmurs, No Parasternal Heave +ve B.Sounds, Abd Soft, No tenderness, no CVA tenderness, urostomy present, left kidney drain present with 100 cc of sanguinous output, No Cyanosis, Clubbing or edema, No new Rash or bruise       Data Review:   Patient Lines/Drains/Airways Status     Active Line/Drains/Airways     Name  Placement date Placement time Site Days   Peripheral IV 11/20/24 22 G 1.75 Anterior;Left;Proximal Forearm 11/20/24  1453  Forearm  2   Closed System Drain 1 Left Back 8 Fr. 11/21/24  1347  Back  1   Urostomy Other (Comment) RLQ --  --  RLQ  --              DVT prophylaxis.  Lovenox .   Family communication.  disussed with wife at bedside 1/9  Inpatient Medications  Scheduled Meds:  (feeding supplement) PROSource Plus  30 mL Oral BID BM   amLODipine   10 mg Oral Daily   enoxaparin  (LOVENOX ) injection  30 mg Subcutaneous Daily   escitalopram   10 mg Oral Daily   fluticasone  furoate-vilanterol  1 puff Inhalation Daily   memantine   10 mg Oral BID   sodium chloride  flush  3 mL Intravenous Q12H   Continuous Infusions:  meropenem  (MERREM ) IV Stopped (11/22/24 1450)   PRN Meds:.acetaminophen  **OR** acetaminophen , ALPRAZolam , hydrALAZINE , levalbuterol , ondansetron  **OR** ondansetron  (ZOFRAN ) IV, [EXPIRED] sodium chloride  **FOLLOWED BY** sodium chloride   DVT Prophylaxis  enoxaparin  (LOVENOX ) injection 30 mg Start: 11/22/24 1445   Recent Labs  Lab 11/16/24 0305 11/17/24 0314 11/19/24 0339 11/21/24 0406 11/22/24 0516  WBC 26.1* 19.2* 18.6* 15.5* 17.3*  HGB 10.9* 9.3* 8.9* 8.6* 8.9*  HCT 37.2* 31.4* 29.1* 28.5* 29.1*  PLT 229 230 251 298 340  MCV 76.9* 75.5* 73.3* 72.9* 72.4*  MCH 22.5* 22.4* 22.4* 22.0* 22.1*  MCHC 29.3* 29.6* 30.6 30.2 30.6  RDW 16.3* 16.0* 16.4* 15.9* 16.0*  LYMPHSABS 0.8  --  0.9  --   --   MONOABS 0.8  --  2.4*  --   --   EOSABS 0.0  --  0.3  --   --   BASOSABS 0.0  --  0.0  --   --     Recent Labs  Lab 11/16/24 0305 11/16/24 1647 11/18/24 0315 11/19/24 0339 11/20/24 0404 11/21/24 0406 11/21/24 0920 11/22/24 0516  NA 140   < > 141 142 142 141  --  140  K 5.6*   < > 4.6 4.0 4.2 4.6  --  4.3  CL 106   < > 110 109 110 109  --  109  CO2 23   < > 21* 23 23 21*  --  21*  ANIONGAP 11   < > 10 9 9 12   --  10  GLUCOSE 151*   < > 104* 132* 110* 108*  --  103*  BUN 55*   < > 57* 62* 61* 60*  --  54*  CREATININE 2.34*   < > 2.22* 2.56* 2.60* 2.57*  --  2.28*  CRP  --    < > 21.8* 25.5* 22.4* 18.6*  --  18.0*  PROCALCITON 8.63  --  4.11 3.02 1.94 1.32  --  0.90  INR 1.4*  --   --   --   --   --  1.3*   --   MG 2.2  --  2.0 2.1 2.0 2.0  --   --   PHOS  --   --  1.6* 3.6 2.6 3.1  --   --   CALCIUM  9.6   < > 8.8* 8.6* 8.8* 9.0  --  8.7*   < > = values in this interval not displayed.      Recent Labs  Lab 11/16/24 0305 11/17/24 9685 11/18/24 0315 11/19/24 9660 11/20/24 0404 11/21/24 0406 11/21/24 0920 11/22/24  0516  CRP  --    < > 21.8* 25.5* 22.4* 18.6*  --  18.0*  PROCALCITON 8.63  --  4.11 3.02 1.94 1.32  --  0.90  INR 1.4*  --   --   --   --   --  1.3*  --   MG 2.2  --  2.0 2.1 2.0 2.0  --   --   CALCIUM  9.6   < > 8.8* 8.6* 8.8* 9.0  --  8.7*   < > = values in this interval not displayed.    --------------------------------------------------------------------------------------------------------------- Lab Results  Component Value Date   CHOL 119 01/29/2024   HDL 61 01/29/2024   LDLCALC 39 01/29/2024   TRIG 94 01/29/2024   CHOLHDL 2.0 01/29/2024    Lab Results  Component Value Date   HGBA1C 6.2 (H) 12/27/2023   No results for input(s): TSH, T4TOTAL, FREET4, T3FREE, THYROIDAB in the last 72 hours. No results for input(s): VITAMINB12, FOLATE, FERRITIN, TIBC, IRON , RETICCTPCT in the last 72 hours. ------------------------------------------------------------------------------------------------------------------ Cardiac Enzymes No results for input(s): CKMB, TROPONINI, MYOGLOBIN in the last 168 hours.  Invalid input(s): CK  Micro Results Recent Results (from the past 240 hours)  Blood Culture (routine x 2)     Status: None   Collection Time: 11/15/24 12:08 PM   Specimen: BLOOD RIGHT HAND  Result Value Ref Range Status   Specimen Description BLOOD RIGHT HAND  Final   Special Requests   Final    BOTTLES DRAWN AEROBIC AND ANAEROBIC Blood Culture results may not be optimal due to an inadequate volume of blood received in culture bottles   Culture   Final    NO GROWTH 5 DAYS Performed at [facility], [address],  [place], [place] [address]    Report Status 11/20/2024 FINAL  Final  Blood Culture (routine x 2)     Status: None   Collection Time: 11/15/24 12:13 PM   Specimen: BLOOD LEFT HAND  Result Value Ref Range Status   Specimen Description BLOOD LEFT HAND  Final   Special Requests   Final    BOTTLES DRAWN AEROBIC AND ANAEROBIC Blood Culture adequate volume   Culture   Final    NO GROWTH 5 DAYS Performed at [facility], [address] [place], [place] [address]    Report Status 11/20/2024 FINAL  Final  Resp panel by RT-PCR (RSV, Flu A&B, Covid) Anterior Nasal Swab     Status: None   Collection Time: 11/15/24 12:46 PM   Specimen: Anterior Nasal Swab  Result Value Ref Range Status   SARS Coronavirus 2 by RT PCR NEGATIVE NEGATIVE Final   Influenza A by PCR NEGATIVE NEGATIVE Final   Influenza B by PCR NEGATIVE NEGATIVE Final    Comment: (NOTE) The Xpert Xpress SARS-CoV-2/FLU/RSV plus assay is intended as an aid in the diagnosis of influenza from Nasopharyngeal swab specimens and should not be used as a sole basis for treatment. Nasal washings and aspirates are unacceptable for Xpert Xpress SARS-CoV-2/FLU/RSV testing.  Fact Sheet for Patients: bloggercourse.com  Fact Sheet for Healthcare Providers: seriousbroker.it  This test is not yet approved or cleared by the [place]  FDA and has been authorized for detection and/or diagnosis of SARS-CoV-2 by FDA under an Emergency Use Authorization (EUA). This EUA will remain in effect (meaning this test can be used) for the duration of the COVID-19 declaration under Section 564(b)(1) of the Act, 21 U.S.C. section 360bbb-3(b)(1), unless the authorization is  terminated or revoked.     Resp Syncytial Virus by PCR NEGATIVE NEGATIVE Final    Comment: (NOTE) Fact Sheet for Patients: bloggercourse.com  Fact Sheet for Healthcare  Providers: seriousbroker.it  This test is not yet approved or cleared by the [place]  FDA and has been authorized for detection and/or diagnosis of SARS-CoV-2 by FDA under an Emergency Use Authorization (EUA). This EUA will remain in effect (meaning this test can be used) for the duration of the COVID-19 declaration under Section 564(b)(1) of the Act, 21 U.S.C. section 360bbb-3(b)(1), unless the authorization is terminated or revoked.  Performed at [facility], [address] [place], [place] [address]   Respiratory (~20 pathogens) panel by PCR     Status: None   Collection Time: 11/15/24 12:46 PM   Specimen: Nasopharyngeal Swab; Respiratory  Result Value Ref Range Status   Adenovirus NOT DETECTED NOT DETECTED Final   Coronavirus 229E NOT DETECTED NOT DETECTED Final    Comment: (NOTE) The Coronavirus on the Respiratory Panel, DOES NOT test for the novel  Coronavirus (2019 nCoV)    Coronavirus HKU1 NOT DETECTED NOT DETECTED Final   Coronavirus NL63 NOT DETECTED NOT DETECTED Final   Coronavirus OC43 NOT DETECTED NOT DETECTED Final   Metapneumovirus NOT DETECTED NOT DETECTED Final   Rhinovirus / Enterovirus NOT DETECTED NOT DETECTED Final   Influenza A NOT DETECTED NOT DETECTED Final   Influenza B NOT DETECTED NOT DETECTED Final   Parainfluenza Virus 1 NOT DETECTED NOT DETECTED Final   Parainfluenza Virus 2 NOT DETECTED NOT DETECTED Final   Parainfluenza Virus 3 NOT DETECTED NOT DETECTED Final   Parainfluenza Virus 4 NOT DETECTED NOT DETECTED Final   Respiratory Syncytial Virus NOT DETECTED NOT DETECTED Final   Bordetella pertussis NOT DETECTED NOT DETECTED Final   Bordetella Parapertussis NOT DETECTED NOT DETECTED Final   Chlamydophila pneumoniae NOT DETECTED NOT DETECTED Final   Mycoplasma pneumoniae NOT DETECTED NOT DETECTED Final    Comment: Performed at [facility], [address], [place], [place] [address]  MRSA Next Gen by  PCR, Nasal     Status: None   Collection Time: 11/16/24 12:40 PM   Specimen: Nasal Mucosa; Nasal Swab  Result Value Ref Range Status   MRSA by PCR Next Gen NOT DETECTED NOT DETECTED Final    Comment: (NOTE) The GeneXpert MRSA Assay (FDA approved for NASAL specimens only), is one component of a comprehensive MRSA colonization surveillance program. It is not intended to diagnose MRSA infection nor to guide or monitor treatment for MRSA infections. Test performance is not FDA approved in patients less than 73 years old. Performed at [facility], [address] [place], [place] [address]   Respiratory (~20 pathogens) panel by PCR     Status: None   Collection Time: 11/18/24  5:28 AM   Specimen: Nasopharyngeal Swab; Respiratory  Result Value Ref Range Status   Adenovirus NOT DETECTED NOT DETECTED Final   Coronavirus 229E NOT DETECTED NOT DETECTED Final    Comment: (NOTE) The Coronavirus on the Respiratory Panel, DOES NOT test for the novel  Coronavirus (2019 nCoV)    Coronavirus HKU1 NOT DETECTED NOT DETECTED Final   Coronavirus NL63 NOT DETECTED NOT DETECTED Final   Coronavirus OC43 NOT DETECTED NOT DETECTED Final   Metapneumovirus NOT DETECTED NOT DETECTED Final   Rhinovirus / Enterovirus NOT DETECTED NOT DETECTED Final   Influenza A NOT DETECTED NOT DETECTED Final   Influenza B NOT DETECTED  NOT DETECTED Final   Parainfluenza Virus 1 NOT DETECTED NOT DETECTED Final   Parainfluenza Virus 2 NOT DETECTED NOT DETECTED Final   Parainfluenza Virus 3 NOT DETECTED NOT DETECTED Final   Parainfluenza Virus 4 NOT DETECTED NOT DETECTED Final   Respiratory Syncytial Virus NOT DETECTED NOT DETECTED Final   Bordetella pertussis NOT DETECTED NOT DETECTED Final   Bordetella Parapertussis NOT DETECTED NOT DETECTED Final   Chlamydophila pneumoniae NOT DETECTED NOT DETECTED Final   Mycoplasma pneumoniae NOT DETECTED NOT DETECTED Final    Comment: Performed at [facility], [address], [place], [place] [address]  Aerobic/Anaerobic Culture w Gram Stain (surgical/deep wound)     Status: None (Preliminary result)   Collection Time: 11/21/24  2:27 PM   Specimen: Abscess  Result Value Ref Range Status   Specimen Description ABSCESS LEFT RENAL  Final   Special Requests NONE  Final   Gram Stain   Final    ABUNDANT WBC PRESENT, PREDOMINANTLY PMN NO ORGANISMS SEEN    Culture   Final    RARE KLEBSIELLA PNEUMONIAE SUSCEPTIBILITIES TO FOLLOW Performed at [facility], [address], [place], [place] [address]    Report Status PENDING  Incomplete    Radiology Reports  IR US  ABSCESS DRAIN PLACEMENT Result Date: 11/21/2024 INDICATION: 84 year old male with history of sepsis and suspected etiology of enlarging left renal fluid collection. EXAM: Ultrasound-guided left renal abscess drain placement MEDICATIONS: The patient is currently admitted to the hospital and receiving intravenous antibiotics. The antibiotics were administered within an appropriate time frame prior to the initiation of the procedure. ANESTHESIA/SEDATION: Fentanyl  25 mcg IV; Versed  0.5 mg IV Moderate Sedation Time:  13 The patient was continuously monitored during the procedure by the interventional radiology nurse under my direct supervision. COMPLICATIONS: None immediate. PROCEDURE: Informed written consent was obtained from the patient after a thorough discussion of the procedural risks, benefits and alternatives. All questions were addressed. Maximal Sterile Barrier Technique was utilized including caps, mask, sterile gowns, sterile gloves, sterile drape, hand hygiene and skin antiseptic. A timeout was performed prior to the initiation of the procedure. Preprocedure ultrasound evaluation demonstrated a mildly complex ovoid fluid collection about the left kidney. The procedure was planned. Subdermal Local anesthesia was administered at the planned needle entry site. Under direct ultrasound visualization, local  anesthetic was administered to the periphery of the lesion. A small skin nick was made. Under direct ultrasound visualization, an 18 gauge trocar needle was directed into the central aspect of the collection. Aspiration was performed which yielded purulent fluid. Given suspected infection in size of the collection, the decision was made to place a drain. Therefore, an Amplatz wire was inserted through the needle and coiled in the collection. After serial dilation was performed, an 8 French skater drain was then placed with the pigtail portion coiled in the central aspect of the collection. Approximately 300 mL of purulent fluid was aspirated. A sample was sent to the lab for culture. The drain was placed to bulb suction. The drain was affixed to the skin with a 0 silk interrupted suture. A sterile bandage was applied. The patient tolerated the procedure well was transferred back to the floor in good condition. IMPRESSION: Technically successful ultrasound-guided left renal abscess 8 French drain placement. Ester Sides, MD [facility] Radiology Electronically Signed   By: Ester Sides M.D.   On: 11/21/2024 15:21      Signature  -   Brayton  Lakeyn Dokken M.D on 11/22/2024 at 2:53 PM   -  To page go to [email or web address]        "

## 2024-11-22 NOTE — Plan of Care (Signed)

## 2024-11-22 NOTE — TOC Progression Note (Signed)
 Transition of Care (TOC) - Progression Note    Patient Details  Name: Jerry [name]. MRN: [id number] Date of Birth: July 27, 1941  Transition [facility]) CM/SW Contact  Johneisha Broaden A Melayah Skorupski, LCSW Phone Number: 11/22/2024, 12:26 PM  Clinical Narrative:     CSW contacted pt's wife Ronal and informed her of bed offer at [facility]. She stated that she wanted to select [facility] for SNF placement. CSW notified Emmalene of choice. CSW to follow up Monday as pt is unable to accept pt over weekend.   CSW will continue to follow.   Expected Discharge Plan: Skilled Nursing Facility Barriers to Discharge: Continued Medical Work up               Expected Discharge Plan and Services In-house Referral: Clinical Social Work   Post Acute Care Choice: Skilled Nursing Facility Living arrangements for the past 2 months: Single Family Home                                       Social Drivers of Health (SDOH) Interventions SDOH Screenings   Food Insecurity: No Food Insecurity (11/16/2024)  Housing: Low Risk (11/16/2024)  Transportation Needs: No Transportation Needs (11/16/2024)  Utilities: Not At Risk (11/16/2024)  Depression (PHQ2-9): Low Risk (04/14/2024)  Social Connections: Moderately Isolated (11/16/2024)  Tobacco Use: Low Risk (11/15/2024)    Readmission Risk Interventions    11/18/2024    3:02 PM 02/18/2024    2:20 PM 06/27/2023   12:12 PM  Readmission Risk Prevention Plan  Transportation Screening Complete Complete Complete  PCP or Specialist Appt within 5-7 Days   Complete  PCP or Specialist Appt within 3-5 Days  Complete   Home Care Screening   Complete  Medication Review (RN CM)   Complete  HRI or Home Care Consult  Complete   Palliative Care Screening  Not Applicable   Medication Review (RN Care Manager) Complete Complete   PCP or Specialist appointment within 3-5 days of discharge Complete    HRI or Home Care Consult Complete    SW Recovery Care/Counseling Consult Complete     Palliative Care Screening Not Applicable    Skilled Nursing Facility Complete

## 2024-11-22 NOTE — Progress Notes (Signed)
 VASCULAR LAB    Arrived to patient's room at 15:45 to attempt venous duplex.  Patient just set up with food tray and wanted to eat. Vascular Sonographer let patient and family know we would return.  Family stated, you aren't coming back at game time, are you? This sonographer let them know it would be tomorrow when patient was available.    Aloysuis Ribaudo, RVT 11/22/2024, 4:15 PM

## 2024-11-23 ENCOUNTER — Inpatient Hospital Stay (HOSPITAL_COMMUNITY)

## 2024-11-23 DIAGNOSIS — R6520 Severe sepsis without septic shock: Secondary | ICD-10-CM | POA: Diagnosis not present

## 2024-11-23 DIAGNOSIS — A419 Sepsis, unspecified organism: Secondary | ICD-10-CM | POA: Diagnosis not present

## 2024-11-23 DIAGNOSIS — R509 Fever, unspecified: Secondary | ICD-10-CM

## 2024-11-23 LAB — CBC
HCT: 28.8 % — ABNORMAL LOW (ref 39.0–52.0)
Hemoglobin: 8.9 g/dL — ABNORMAL LOW (ref 13.0–17.0)
MCH: 22.2 pg — ABNORMAL LOW (ref 26.0–34.0)
MCHC: 30.9 g/dL (ref 30.0–36.0)
MCV: 71.8 fL — ABNORMAL LOW (ref 80.0–100.0)
Platelets: 355 K/uL (ref 150–400)
RBC: 4.01 MIL/uL — ABNORMAL LOW (ref 4.22–5.81)
RDW: 15.9 % — ABNORMAL HIGH (ref 11.5–15.5)
WBC: 14.7 K/uL — ABNORMAL HIGH (ref 4.0–10.5)
nRBC: 0 % (ref 0.0–0.2)

## 2024-11-23 LAB — BASIC METABOLIC PANEL WITH GFR
Anion gap: 10 (ref 5–15)
BUN: 50 mg/dL — ABNORMAL HIGH (ref 8–23)
CO2: 21 mmol/L — ABNORMAL LOW (ref 22–32)
Calcium: 8.7 mg/dL — ABNORMAL LOW (ref 8.9–10.3)
Chloride: 111 mmol/L (ref 98–111)
Creatinine, Ser: 2.33 mg/dL — ABNORMAL HIGH (ref 0.61–1.24)
GFR, Estimated: 27 mL/min — ABNORMAL LOW
Glucose, Bld: 92 mg/dL (ref 70–99)
Potassium: 4.4 mmol/L (ref 3.5–5.1)
Sodium: 142 mmol/L (ref 135–145)

## 2024-11-23 LAB — C-REACTIVE PROTEIN: CRP: 14.5 mg/dL — ABNORMAL HIGH

## 2024-11-23 NOTE — Progress Notes (Signed)
 " Progress Note   Patient: Jerry [name]. 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  Patient was febrile on arrival to the ED, diagnosed with sepsis and started on IV antibiotics for infection, likely pulmonary source.   Subjective:  No nausea, no vomiting, reports he is feeling better today, he is afebrile for last 24 hours  Assessment and Plan:  Sepsis POA community  acquired pneumonia  Bronchiectasis exacerbation in a patient with history of smoking in the past and evidence of bronchiectasis on imaging -Chest/abdomen/pelvis significant for bronchiectasis with consolidation concerning for pneumonia . - With acute illness, frail, deconditioned and bronchiectasis, with significantly elevated CRP, Pro-Cal and leukocytosis, so antibiotics has been broadened, treated  with meropenem , Flagyl  and Zyvox , continue with meropenem , stop Zyvox  and Flagyl , please see discussion below - Legionella antigen and strep pneumonia negative  Left renal fluid collection (cyst versus abscess) - Patient remains febrile, despite being on appropriate antibiotic coverage, with significant leukocytosis, elevated CRP, imaging on admission with left renal fluid collection concerning for cyst versus abscess, (appears to be increasing in size, was present in 01/2024), discussed with urology, repeat imaging significant for increased in size (no IV contrast due to renal sufficiency), they recommend aspiration by IR, IR consulted.  This post ultrasound-guided drain insertion, 300 cc with immediate output, 100 cc output  overnight, many white blood cells, prelim culture showing Klebsiella.  So stop Zyvox  and Flagyl  and continue with meropenem  for now  Fever - Patient persistently febrile for a few days upon presentation, most likely due to above . - Venous Doppler is negative   History of pulmonary embolism   - CT angio chest 02/15/2024 showed bilateral lower lobe PE, has IVC filter.  VQ scan this admission negative.  Venous Dopplers negative this admission.  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) - patient has history of CKD3b. Baseline creat rose to 2, AKI due to sepsis improved after hydration.  Prediabetes - Last A1c 6.2% on 12/27/2023, Regular diet for now given poor p.o. intake.  PCP to monitor.  Vascular dementia (HCC) and parkinsonism- Continue Xanax  as needed Lexapro , Namenda , Zyprexa , at risk for delirium minimize benzodiazepines and narcotics.  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        Physical Exam: BP 115/67 (BP Location: Right Arm)   Pulse 88   Temp (!) 97.5 F (36.4 C) (Oral)   Resp 20   Ht 5' 11 (1.803 m)   Wt 83.9 kg   SpO2 99%   BMI 25.80 kg/m     Awake, alert, oriented x 3, frail Good air entry Regular rate and rhythm Abdomen soft  left kidney drain present  No Cyanosis, Clubbing or edema, No new Rash or bruise       Data Review:   Patient Lines/Drains/Airways Status     Active Line/Drains/Airways     Name Placement date Placement time Site Days   Peripheral  IV 11/20/24 22 G 1.75 Anterior;Left;Proximal Forearm 11/20/24  1453  Forearm  3   Closed System Drain 1 Left Back 8 Fr. 11/21/24  1347  Back  2   Urostomy Other (Comment) RLQ --  --  RLQ  --             DVT prophylaxis.  Lovenox .   Family communication.  None at bedside  Inpatient  Medications  Scheduled Meds:  (feeding supplement) PROSource Plus  30 mL Oral BID BM   amLODipine   10 mg Oral Daily   enoxaparin  (LOVENOX ) injection  30 mg Subcutaneous Daily   escitalopram   10 mg Oral Daily   fluticasone  furoate-vilanterol  1 puff Inhalation Daily   memantine   10 mg Oral BID   sodium chloride  flush  3 mL Intravenous Q12H   Continuous Infusions:  meropenem  (MERREM ) IV 1 g (11/23/24 0611)   PRN Meds:.acetaminophen  **OR** acetaminophen , ALPRAZolam , hydrALAZINE , levalbuterol , ondansetron  **OR** ondansetron  (ZOFRAN ) IV, [EXPIRED] sodium chloride  **FOLLOWED BY** sodium chloride   DVT Prophylaxis  enoxaparin  (LOVENOX ) injection 30 mg Start: 11/22/24 1445   Recent Labs  Lab 11/17/24 0314 11/19/24 0339 11/21/24 0406 11/22/24 0516 11/23/24 0429  WBC 19.2* 18.6* 15.5* 17.3* 14.7*  HGB 9.3* 8.9* 8.6* 8.9* 8.9*  HCT 31.4* 29.1* 28.5* 29.1* 28.8*  PLT 230 251 298 340 355  MCV 75.5* 73.3* 72.9* 72.4* 71.8*  MCH 22.4* 22.4* 22.0* 22.1* 22.2*  MCHC 29.6* 30.6 30.2 30.6 30.9  RDW 16.0* 16.4* 15.9* 16.0* 15.9*  LYMPHSABS  --  0.9  --   --   --   MONOABS  --  2.4*  --   --   --   EOSABS  --  0.3  --   --   --   BASOSABS  --  0.0  --   --   --     Recent Labs  Lab 11/18/24 0315 11/19/24 0339 11/20/24 0404 11/21/24 0406 11/21/24 0920 11/22/24 0516 11/23/24 0429  NA 141 142 142 141  --  140 142  K 4.6 4.0 4.2 4.6  --  4.3 4.4  CL 110 109 110 109  --  109 111  CO2 21* 23 23 21*  --  21* 21*  ANIONGAP 10 9 9 12   --  10 10  GLUCOSE 104* 132* 110* 108*  --  103* 92  BUN 57* 62* 61* 60*  --  54* 50*  CREATININE 2.22* 2.56* 2.60* 2.57*  --  2.28* 2.33*  CRP 21.8* 25.5* 22.4* 18.6*  --  18.0* 14.5*  PROCALCITON 4.11 3.02 1.94 1.32  --  0.90  --   INR  --   --   --   --  1.3*  --   --   MG 2.0 2.1 2.0 2.0  --   --   --   PHOS 1.6* 3.6 2.6 3.1  --   --   --   CALCIUM  8.8* 8.6* 8.8* 9.0  --  8.7* 8.7*      Recent Labs  Lab 11/18/24 0315 11/19/24 0339  11/20/24 0404 11/21/24 0406 11/21/24 0920 11/22/24 0516 11/23/24 0429  CRP 21.8* 25.5* 22.4* 18.6*  --  18.0* 14.5*  PROCALCITON 4.11 3.02 1.94 1.32  --  0.90  --   INR  --   --   --   --  1.3*  --   --   MG 2.0 2.1 2.0 2.0  --   --   --   CALCIUM  8.8* 8.6* 8.8*  9.0  --  8.7* 8.7*    --------------------------------------------------------------------------------------------------------------- Lab Results  Component Value Date   CHOL 119 01/29/2024   HDL 61 01/29/2024   LDLCALC 39 01/29/2024   TRIG 94 01/29/2024   CHOLHDL 2.0 01/29/2024    Lab Results  Component Value Date   HGBA1C 6.2 (H) 12/27/2023   No results for input(s): TSH, T4TOTAL, FREET4, T3FREE, THYROIDAB in the last 72 hours. No results for input(s): VITAMINB12, FOLATE, FERRITIN, TIBC, IRON , RETICCTPCT in the last 72 hours. ------------------------------------------------------------------------------------------------------------------ Cardiac Enzymes No results for input(s): CKMB, TROPONINI, MYOGLOBIN in the last 168 hours.  Invalid input(s): CK  Micro Results Recent Results (from the past 240 hours)  Blood Culture (routine x 2)     Status: None   Collection Time: 11/15/24 12:08 PM   Specimen: BLOOD RIGHT HAND  Result Value Ref Range Status   Specimen Description BLOOD RIGHT HAND  Final   Special Requests   Final    BOTTLES DRAWN AEROBIC AND ANAEROBIC Blood Culture results may not be optimal due to an inadequate volume of blood received in culture bottles   Culture   Final    NO GROWTH 5 DAYS Performed at [facility], [address], [place], [place] [address]    Report Status 11/20/2024 FINAL  Final  Blood Culture (routine x 2)     Status: None   Collection Time: 11/15/24 12:13 PM   Specimen: BLOOD LEFT HAND  Result Value Ref Range Status   Specimen Description BLOOD LEFT HAND  Final   Special Requests   Final    BOTTLES DRAWN AEROBIC AND ANAEROBIC Blood  Culture adequate volume   Culture   Final    NO GROWTH 5 DAYS Performed at [facility], [address] [place], [place] [address]    Report Status 11/20/2024 FINAL  Final  Resp panel by RT-PCR (RSV, Flu A&B, Covid) Anterior Nasal Swab     Status: None   Collection Time: 11/15/24 12:46 PM   Specimen: Anterior Nasal Swab  Result Value Ref Range Status   SARS Coronavirus 2 by RT PCR NEGATIVE NEGATIVE Final   Influenza A by PCR NEGATIVE NEGATIVE Final   Influenza B by PCR NEGATIVE NEGATIVE Final    Comment: (NOTE) The Xpert Xpress SARS-CoV-2/FLU/RSV plus assay is intended as an aid in the diagnosis of influenza from Nasopharyngeal swab specimens and should not be used as a sole basis for treatment. Nasal washings and aspirates are unacceptable for Xpert Xpress SARS-CoV-2/FLU/RSV testing.  Fact Sheet for Patients: bloggercourse.com  Fact Sheet for Healthcare Providers: seriousbroker.it  This test is not yet approved or cleared by the [place]  FDA and has been authorized for detection and/or diagnosis of SARS-CoV-2 by FDA under an Emergency Use Authorization (EUA). This EUA will remain in effect (meaning this test can be used) for the duration of the COVID-19 declaration under Section 564(b)(1) of the Act, 21 U.S.C. section 360bbb-3(b)(1), unless the authorization is terminated or revoked.     Resp Syncytial Virus by PCR NEGATIVE NEGATIVE Final    Comment: (NOTE) Fact Sheet for Patients: bloggercourse.com  Fact Sheet for Healthcare Providers: seriousbroker.it  This test is not yet approved or cleared by the [place]  FDA and has been authorized for detection and/or diagnosis of SARS-CoV-2 by FDA under an Emergency Use Authorization (EUA). This EUA will remain in effect (meaning this test can be used) for the duration of the COVID-19 declaration under Section  564(b)(1) of the Act,  21 U.S.C. section 360bbb-3(b)(1), unless the authorization is terminated or revoked.  Performed at [facility], [address] [place], [place] [address]   Respiratory (~20 pathogens) panel by PCR     Status: None   Collection Time: 11/15/24 12:46 PM   Specimen: Nasopharyngeal Swab; Respiratory  Result Value Ref Range Status   Adenovirus NOT DETECTED NOT DETECTED Final   Coronavirus 229E NOT DETECTED NOT DETECTED Final    Comment: (NOTE) The Coronavirus on the Respiratory Panel, DOES NOT test for the novel  Coronavirus (2019 nCoV)    Coronavirus HKU1 NOT DETECTED NOT DETECTED Final   Coronavirus NL63 NOT DETECTED NOT DETECTED Final   Coronavirus OC43 NOT DETECTED NOT DETECTED Final   Metapneumovirus NOT DETECTED NOT DETECTED Final   Rhinovirus / Enterovirus NOT DETECTED NOT DETECTED Final   Influenza A NOT DETECTED NOT DETECTED Final   Influenza B NOT DETECTED NOT DETECTED Final   Parainfluenza Virus 1 NOT DETECTED NOT DETECTED Final   Parainfluenza Virus 2 NOT DETECTED NOT DETECTED Final   Parainfluenza Virus 3 NOT DETECTED NOT DETECTED Final   Parainfluenza Virus 4 NOT DETECTED NOT DETECTED Final   Respiratory Syncytial Virus NOT DETECTED NOT DETECTED Final   Bordetella pertussis NOT DETECTED NOT DETECTED Final   Bordetella Parapertussis NOT DETECTED NOT DETECTED Final   Chlamydophila pneumoniae NOT DETECTED NOT DETECTED Final   Mycoplasma pneumoniae NOT DETECTED NOT DETECTED Final    Comment: Performed at [facility], [address], [place], [place] [address]  MRSA Next Gen by PCR, Nasal     Status: None   Collection Time: 11/16/24 12:40 PM   Specimen: Nasal Mucosa; Nasal Swab  Result Value Ref Range Status   MRSA by PCR Next Gen NOT DETECTED NOT DETECTED Final    Comment: (NOTE) The GeneXpert MRSA Assay (FDA approved for NASAL specimens only), is one component of a comprehensive MRSA colonization surveillance program. It is not  intended to diagnose MRSA infection nor to guide or monitor treatment for MRSA infections. Test performance is not FDA approved in patients less than 20 years old. Performed at [facility], [address], [place], [place] [address]   Respiratory (~20 pathogens) panel by PCR     Status: None   Collection Time: 11/18/24  5:28 AM   Specimen: Nasopharyngeal Swab; Respiratory  Result Value Ref Range Status   Adenovirus NOT DETECTED NOT DETECTED Final   Coronavirus 229E NOT DETECTED NOT DETECTED Final    Comment: (NOTE) The Coronavirus on the Respiratory Panel, DOES NOT test for the novel  Coronavirus (2019 nCoV)    Coronavirus HKU1 NOT DETECTED NOT DETECTED Final   Coronavirus NL63 NOT DETECTED NOT DETECTED Final   Coronavirus OC43 NOT DETECTED NOT DETECTED Final   Metapneumovirus NOT DETECTED NOT DETECTED Final   Rhinovirus / Enterovirus NOT DETECTED NOT DETECTED Final   Influenza A NOT DETECTED NOT DETECTED Final   Influenza B NOT DETECTED NOT DETECTED Final   Parainfluenza Virus 1 NOT DETECTED NOT DETECTED Final   Parainfluenza Virus 2 NOT DETECTED NOT DETECTED Final   Parainfluenza Virus 3 NOT DETECTED NOT DETECTED Final   Parainfluenza Virus 4 NOT DETECTED NOT DETECTED Final   Respiratory Syncytial Virus NOT DETECTED NOT DETECTED Final   Bordetella pertussis NOT DETECTED NOT DETECTED Final   Bordetella Parapertussis NOT DETECTED NOT DETECTED Final   Chlamydophila pneumoniae NOT DETECTED NOT DETECTED Final   Mycoplasma pneumoniae NOT DETECTED NOT DETECTED Final  Comment: Performed at [facility], [address] [place], [place] [address]  Aerobic/Anaerobic Culture w Gram Stain (surgical/deep wound)     Status: None (Preliminary result)   Collection Time: 11/21/24  2:27 PM   Specimen: Abscess  Result Value Ref Range Status   Specimen Description ABSCESS LEFT RENAL  Final   Special Requests NONE  Final   Gram Stain   Final    ABUNDANT WBC PRESENT, PREDOMINANTLY  PMN NO ORGANISMS SEEN Performed at [facility], [address] [place], [place] [address]    Culture   Final    RARE KLEBSIELLA PNEUMONIAE NO ANAEROBES ISOLATED; CULTURE IN PROGRESS FOR 5 DAYS    Report Status PENDING  Incomplete   Organism ID, Bacteria KLEBSIELLA PNEUMONIAE  Final      Susceptibility   Klebsiella pneumoniae - MIC*    AMPICILLIN  RESISTANT Resistant     CEFAZOLIN (NON-URINE) 2 SENSITIVE Sensitive     CEFEPIME  <=0.12 SENSITIVE Sensitive     ERTAPENEM <=0.12 SENSITIVE Sensitive     CEFTRIAXONE  <=0.25 SENSITIVE Sensitive     CIPROFLOXACIN <=0.06 SENSITIVE Sensitive     GENTAMICIN <=1 SENSITIVE Sensitive     MEROPENEM  <=0.25 SENSITIVE Sensitive     TRIMETH/SULFA <=20 SENSITIVE Sensitive     AMPICILLIN /SULBACTAM <=2 SENSITIVE Sensitive     PIP/TAZO Value in next row Sensitive      <=4 SENSITIVEThis is a modified FDA-approved test that has been validated and its performance characteristics determined by the reporting laboratory.  This laboratory is certified under the Clinical Laboratory Improvement Amendments CLIA as qualified to perform high complexity clinical laboratory testing.    * RARE KLEBSIELLA PNEUMONIAE    Radiology Reports  VAS US  LOWER EXTREMITY VENOUS (DVT) Result Date: 11/23/2024  Lower Venous DVT Study Patient Name:  Daniela Hernan.  Date of Exam:   11/23/2024 Medical Rec #: [id number]          Accession #:    [id number] Date of Birth: 30-Dec-1940           Patient Gender: M Patient Age:   16 years Exam Location:  [facility] Procedure:      VAS US  LOWER EXTREMITY VENOUS (DVT) Referring Phys: Charene Mccallister --------------------------------------------------------------------------------  Indications: Persistent fever.  Risk Factors: Cancer; Bladder 2019. DVT 02/22/24 left popliteal. IVC filter placed 4/112025 by [facility]. PE 02/15/2024 Comparison Study: Prior negative bilateral LEV done 03/05/24 (resolution of left                   popliteal DVT noted on  study 02/22/24) Performing Technologist: Alberta Lis RVS  Examination Guidelines: A complete evaluation includes B-mode imaging, spectral Doppler, color Doppler, and power Doppler as needed of all accessible portions of each vessel. Bilateral testing is considered an integral part of a complete examination. Limited examinations for reoccurring indications may be performed as noted. The reflux portion of the exam is performed with the patient in reverse Trendelenburg.  +---------+---------------+---------+-----------+----------+---------------+ RIGHT    CompressibilityPhasicitySpontaneityPropertiesThrombus Aging  +---------+---------------+---------+-----------+----------+---------------+ CFV      Full           Yes      Yes                                  +---------+---------------+---------+-----------+----------+---------------+ SFJ      Full                                                         +---------+---------------+---------+-----------+----------+---------------+  FV Prox  Full           Yes      Yes                                  +---------+---------------+---------+-----------+----------+---------------+ FV Mid   Full                                                         +---------+---------------+---------+-----------+----------+---------------+ FV DistalFull                                                         +---------+---------------+---------+-----------+----------+---------------+ PFV      Full           Yes      Yes                                  +---------+---------------+---------+-----------+----------+---------------+ POP      Full           Yes      Yes                                  +---------+---------------+---------+-----------+----------+---------------+ PTV      Full                                                         +---------+---------------+---------+-----------+----------+---------------+ PERO      Full                                                         +---------+---------------+---------+-----------+----------+---------------+ Gastroc  Full                                                         +---------+---------------+---------+-----------+----------+---------------+ SSV      Full                                         Thickened walls +---------+---------------+---------+-----------+----------+---------------+   +---------+---------------+---------+-----------+----------+--------------+ LEFT     CompressibilityPhasicitySpontaneityPropertiesThrombus Aging +---------+---------------+---------+-----------+----------+--------------+ CFV      Full           Yes      Yes                                 +---------+---------------+---------+-----------+----------+--------------+ SFJ  Full                                                        +---------+---------------+---------+-----------+----------+--------------+ FV Prox  Full           Yes      Yes                                 +---------+---------------+---------+-----------+----------+--------------+ FV Mid   Full           Yes      Yes                                 +---------+---------------+---------+-----------+----------+--------------+ FV DistalFull           Yes      Yes                                 +---------+---------------+---------+-----------+----------+--------------+ PFV      Full           Yes      Yes                                 +---------+---------------+---------+-----------+----------+--------------+ POP      Full           Yes      Yes                                 +---------+---------------+---------+-----------+----------+--------------+ PTV      Full                                                        +---------+---------------+---------+-----------+----------+--------------+ PERO     Full                                                         +---------+---------------+---------+-----------+----------+--------------+ SSV      Full                                                        +---------+---------------+---------+-----------+----------+--------------+     Summary: RIGHT: - There is no evidence of deep vein thrombosis in the lower extremity.  - No cystic structure found in the popliteal fossa.  LEFT: - There is no evidence of deep vein thrombosis in the lower extremity.  - No cystic structure found in the popliteal fossa.  *See table(s) above for measurements and observations.    Preliminary       Signature  -   Shariya Gaster  M.D on 11/23/2024 at 3:34 PM   -  To page go to [email or web address]        "

## 2024-11-23 NOTE — Progress Notes (Signed)
 VASCULAR LAB    Bilateral lower extremity venous duplex has been performed.  See CV proc for preliminary results.   Kariem Wolfson, RVT 11/23/2024, 2:12 PM

## 2024-11-23 NOTE — Plan of Care (Signed)

## 2024-11-24 ENCOUNTER — Other Ambulatory Visit: Payer: Self-pay

## 2024-11-24 ENCOUNTER — Other Ambulatory Visit (HOSPITAL_COMMUNITY): Payer: Self-pay

## 2024-11-24 DIAGNOSIS — A419 Sepsis, unspecified organism: Secondary | ICD-10-CM | POA: Diagnosis not present

## 2024-11-24 DIAGNOSIS — N151 Renal and perinephric abscess: Secondary | ICD-10-CM

## 2024-11-24 DIAGNOSIS — R6520 Severe sepsis without septic shock: Secondary | ICD-10-CM | POA: Diagnosis not present

## 2024-11-24 LAB — CBC
HCT: 27.7 % — ABNORMAL LOW (ref 39.0–52.0)
Hemoglobin: 8.6 g/dL — ABNORMAL LOW (ref 13.0–17.0)
MCH: 22.1 pg — ABNORMAL LOW (ref 26.0–34.0)
MCHC: 31 g/dL (ref 30.0–36.0)
MCV: 71.2 fL — ABNORMAL LOW (ref 80.0–100.0)
Platelets: 373 K/uL (ref 150–400)
RBC: 3.89 MIL/uL — ABNORMAL LOW (ref 4.22–5.81)
RDW: 15.9 % — ABNORMAL HIGH (ref 11.5–15.5)
WBC: 12 K/uL — ABNORMAL HIGH (ref 4.0–10.5)
nRBC: 0 % (ref 0.0–0.2)

## 2024-11-24 MED ORDER — SODIUM CHLORIDE 0.9% FLUSH
10.0000 mL | Freq: Every day | INTRAVENOUS | 0 refills | Status: AC
  Filled 2024-11-24: qty 600, 60d supply, fill #0

## 2024-11-24 MED ORDER — SODIUM CHLORIDE 0.9 % IV SOLN
2.0000 g | INTRAVENOUS | Status: DC
  Administered 2024-11-24 – 2024-11-25 (×2): 2 g via INTRAVENOUS
  Filled 2024-11-24 (×2): qty 20

## 2024-11-24 NOTE — Discharge Instructions (Addendum)
 Urology Instructions:  As long as drain is in, keep track of output in milliliters and record volume every 24hrs. Once outpt diminishes to 10mL every 24hrs., pt will need re-imaging with IR or Urology prior to drain removal. Please contact office with marked elevation of drain outpt, fever > 100.4, chills, or night sweats.  [facility] [address] floor [place], [place]  [address] [phone]     Interventional Radiology Percutaneous Abscess Drain Placement After Care   This sheet gives you information about how to care for yourself after your procedure. Your health care provider may also give you more specific instructions. Your drain was placed by an interventional radiologist with [facility]. If you have questions or concerns, contact [facility] at [phone].   What is a percutaneous drain?   A drain is a small plastic tube (catheter) that goes into the fluid collection in your body through your skin.   How long will I need the drain?   How long the drain needs to stay in is determined by where the drain is, how much comes out of the drain each day and if you are having any other surgical procedures.   Interventional radiology will determine when it is time to remove the drain. It is important to follow up as directed so that the drain can be removed as soon as it is safe to do so.   What can I expect after the procedure?   After the procedure, it is common to have:   A small amount of bruising and discomfort in the area where the drainage tube (catheter) was placed.   Sleepiness and fatigue. This should go away after the medicines you were given have worn off.   Follow these instructions at home:   Insertion site care   Check your insertion site when you change the bandage. Check for:   More redness, swelling, or pain.   More fluid or blood.   Warmth.   Pus or a bad smell.   When caring for your  insertion site:   Wash your hands with soap and water for at least 20 seconds before and after you change your bandage (dressing). If soap and water are not available, use hand sanitizer.   You do not need to change your dressing everyday if it is clean and dry. Change your dressing every 3 days or as needed when it is soiled, wet or becoming dislodged. You will need to change your dressing each time you shower.   Leave stitches (sutures), skin glue, or adhesive strips in place. These skin closures may need to stay in place for 2 weeks or longer. If adhesive strip edges start to loosen and curl up, you may trim the loose edges. Do not remove adhesive strips completely unless your health care provider tells you to do so.   Catheter care   Flush the catheter once per day with 5 mL of 0.9% normal saline unless you are told otherwise by your healthcare provider. This helps to prevent clogs in the catheter.   To disconnect the drain, turn the clear plastic tube to the left. Attach the saline syringe by placing it on the white end of the drain and turning gently to the right. Once attached gently push the plunger to the 5 mL mark. After you are done flushing, disconnect the syringe by turning to the left and reattach your drainage container   If you have a bulb please be  sure the bulb is charged after reconnecting it - to do this pinch the bulb between your thumb and first finger and close the stopper located on the top of the bulb.    Check for fluid leaking from around your catheter (instead of fluid draining through your catheter). This may be a sign that the drain is no longer working correctly.   Write down the following information every time you empty your bag:   The date and time.   The amount of drainage.   Activity   Rest at home for 1-2 days after your procedure.   For the first 48 hours do not lift anything more than 10 lbs (about a gallon of milk). You may perform moderate  activities/exercise. Please avoid strenuous activities during this time.   Avoid any activities which may pull on your drain as this can cause your drain to become dislodged.   If you were given a sedative during the procedure, it can affect you for several hours. Do not drive or operate machinery until your health care provider says that it is safe.   General instructions   For mild pain take over-the-counter medications as needed for pain such as Tylenol  or Advil . If you are experiencing severe pain please call our office as this may indicate an issue with your drain.    If you were prescribed an antibiotic medicine, take it as told by your health care provider. Do not stop using the antibiotic even if you start to feel better.   You may shower 24 hours after the drain is placed. To do this cover the insertion site with a water tight material such as saran wrap and seal the edges with tape, you may also purchase waterproof dressings at your local drug store. Shower as usual and then remove the water tight dressing and any gauze/tape underneath it once you have exited the shower and dried off. Allow the area to air dry or pat dry with a clean towel. Once the skin is completely dry place a new gauze dressing. It is important to keep the site dry at all times to prevent infection.   Do not submerge the drain - this means you cannot take baths, swim, use a hot tub, etc. until the drain is removed.    Do not use any products that contain nicotine or tobacco, such as cigarettes, e-cigarettes, and chewing tobacco. If you need help quitting, ask your health care provider.   Keep all follow-up visits as told by your health care provider. This is important.   Contact a health care provider if:   You have less than 10 mL of drainage a day for 2-3 days in a row, or as directed by your health care provider.   You have any of these signs of infection:   More redness, swelling, or pain around your  incision area.   More fluid or blood coming from your incision area.   Warmth coming from your incision area.   Pus or a bad smell coming from your incision area.   You have fluid leaking from around your catheter (instead of through your catheter).   You are unable to flush the drain.   You have a fever or chills.   You have pain that does not get better with medicine.   You have not been contacted to schedule a drain follow up appointment within 10 days of discharge from the hospital.   Please call [facility] at [phone])  [phone] with any questions or concerns.   Get help right away if:   Your catheter comes out.   You suddenly stop having drainage from your catheter.   You suddenly have blood in the fluid that is draining from your catheter.   You become dizzy or you faint.   You develop a rash.   You have nausea or vomiting.   You have difficulty breathing or you feel short of breath.   You develop chest pain.   You have problems with your speech or vision.   You have trouble balancing or moving your arms or legs.   Summary   It is common to have a small amount of bruising and discomfort in the area where the drainage tube (catheter) was placed. You may also have minor discomfort with movement while the drain is in place.   Flush the drain once per day with 5 mL of 0.9% normal saline (unless you were told otherwise by your healthcare provider).    Record the amount of drainage from the bag every time you empty it.   Change the dressing every 3 days or earlier if soiled/wet. Keep the skin dry under the dressing.   You may shower with the drain in place. Do not submerge the drain (no baths, swimming, hot tubs, etc.).   Contact  Radiology at [phone] if you have more redness, swelling, or pain around your incision area or if you have pain that does not get better with medicine.   This information is not intended to replace advice given  to you by your health care provider. Make sure you discuss any questions you have with your health care provider.   Document Revised: 02/02/2022 Document Reviewed: 10/25/2019   [employer] Patient Education  2023 [employer]         Interventional Radiology Drain Record   Empty your drain at least once per day. You may empty it as often as needed. Use this form to write down the amount of fluid that has collected in the drainage container. Bring this form with you to your follow-up visits. Please call [facility] at [phone] with any questions or concerns prior to your appointment.   Drain #1 location: ___________________   Date __________ Time __________ Amount __________   Date __________ Time __________ Amount __________   Date __________ Time __________ Amount __________   Date __________ Time __________ Amount __________   Date __________ Time __________ Amount __________   Date __________ Time __________ Amount __________   Date __________ Time __________ Amount __________   Date __________ Time __________ Amount __________   Date __________ Time __________ Amount __________   Date __________ Time __________ Amount __________   Date __________ Time __________ Amount __________   Date __________ Time __________ Amount __________   Date __________ Time __________ Amount __________   Date __________ Time __________ Amount __________

## 2024-11-24 NOTE — Progress Notes (Signed)
 " Progress Note   Patient: Jerry [name]. 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  Patient was febrile on arrival to the ED, diagnosed with sepsis and started on IV antibiotics for infection, likely pulmonary source.   Subjective:  No nausea, no vomiting, oral intake, afebrile for last 2 days.    Assessment and Plan:  Sepsis POA community  acquired pneumonia  Bronchiectasis exacerbation in a patient with history of smoking in the past and evidence of bronchiectasis on imaging -Chest/abdomen/pelvis significant for bronchiectasis with consolidation concerning for pneumonia . - With acute illness, frail, deconditioned and bronchiectasis, with significantly elevated CRP, Pro-Cal and leukocytosis, so antibiotics has been broadened, treated  with meropenem , Flagyl  and Zyvox , continue with meropenem , stop Zyvox  and Flagyl , please see discussion below - Legionella antigen and strep pneumonia negative  Left renal fluid collection (cyst versus abscess) is likely infected left renal cyst - Patient remains febrile, despite being on appropriate antibiotic coverage, with significant leukocytosis, elevated CRP, imaging on admission with left renal fluid collection concerning for cyst versus abscess, (appears to be increasing in size, was present in 01/2024), discussed with urology, repeat imaging significant for increased in size (no IV contrast due to renal sufficiency), they recommend aspiration by IR, IR consulted.  This post ultrasound-guided drain insertion, 300 cc with immediate output,  continues to drain, but it is less by the day, 40 cc output over last 24 hours, this is most likely infected renal cyst, growing Klebsiella, sensitive to cephalosporin, so antibiotic has been narrowed to Rocephin  , continue with antibiotic coverage as long it is draining but can transition to oral by tomorrow    Fever - Patient persistently febrile for a few days upon presentation, most likely due to above .  He is afebrile for last 2 days. - Venous Doppler is negative   History of pulmonary embolism   - CT angio chest 02/15/2024 showed bilateral lower lobe PE, has IVC filter.  VQ scan this admission negative.  Venous Dopplers negative this admission.  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) - patient has history of CKD3b. Baseline creat rose to 2, AKI due to sepsis improved after hydration.  Prediabetes - Last A1c 6.2% on 12/27/2023, Regular diet for now given poor p.o. intake.  PCP to monitor.  Vascular dementia (HCC) and parkinsonism- Continue Xanax  as needed Lexapro , Namenda , Zyprexa , at risk for delirium minimize benzodiazepines and narcotics.  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        Physical Exam: BP (!) 140/83 (BP Location: Right Arm)   Pulse 86   Temp 98.9 F (37.2 C) (Oral)   Resp 19   Ht 5' 11 (1.803 m)   Wt 83.9 kg   SpO2 96%   BMI 25.80 kg/m     Awake, alert, oriented x 3, frail Good air entry Regular rate and rhythm Abdomen soft  left kidney drain present  No Cyanosis,  Clubbing or edema, No new Rash or bruise       Data Review:   Patient Lines/Drains/Airways Status     Active Line/Drains/Airways     Name Placement date Placement time Site Days   Peripheral IV 11/20/24 22 G 1.75 Anterior;Left;Proximal Forearm 11/20/24  1453  Forearm  4   Closed  System Drain 1 Left Back 8 Fr. 11/21/24  1347  Back  3   Urostomy Other (Comment) RLQ --  --  RLQ  --             DVT prophylaxis.  Lovenox .   Family communication.  discussed with wife by phone  Inpatient Medications  Scheduled Meds:  (feeding supplement) PROSource Plus  30 mL Oral BID BM   amLODipine   10 mg Oral Daily   enoxaparin  (LOVENOX ) injection  30 mg Subcutaneous Daily   escitalopram   10 mg Oral Daily   fluticasone  furoate-vilanterol  1 puff Inhalation Daily   memantine   10 mg Oral BID   sodium chloride  flush  3 mL Intravenous Q12H   Continuous Infusions:  cefTRIAXone  (ROCEPHIN )  IV 2 g (11/24/24 1035)   PRN Meds:.acetaminophen  **OR** acetaminophen , ALPRAZolam , hydrALAZINE , levalbuterol , ondansetron  **OR** ondansetron  (ZOFRAN ) IV, [EXPIRED] sodium chloride  **FOLLOWED BY** sodium chloride   DVT Prophylaxis  enoxaparin  (LOVENOX ) injection 30 mg Start: 11/22/24 1445   Recent Labs  Lab 11/19/24 0339 11/21/24 0406 11/22/24 0516 11/23/24 0429 11/24/24 0320  WBC 18.6* 15.5* 17.3* 14.7* 12.0*  HGB 8.9* 8.6* 8.9* 8.9* 8.6*  HCT 29.1* 28.5* 29.1* 28.8* 27.7*  PLT 251 298 340 355 373  MCV 73.3* 72.9* 72.4* 71.8* 71.2*  MCH 22.4* 22.0* 22.1* 22.2* 22.1*  MCHC 30.6 30.2 30.6 30.9 31.0  RDW 16.4* 15.9* 16.0* 15.9* 15.9*  LYMPHSABS 0.9  --   --   --   --   MONOABS 2.4*  --   --   --   --   EOSABS 0.3  --   --   --   --   BASOSABS 0.0  --   --   --   --     Recent Labs  Lab 11/18/24 0315 11/19/24 0339 11/20/24 0404 11/21/24 0406 11/21/24 0920 11/22/24 0516 11/23/24 0429  NA 141 142 142 141  --  140 142  K 4.6 4.0 4.2 4.6  --  4.3 4.4  CL 110 109 110 109  --  109 111  CO2 21* 23 23 21*  --  21* 21*  ANIONGAP 10 9 9 12   --  10 10  GLUCOSE 104* 132* 110* 108*  --  103* 92  BUN 57* 62* 61* 60*  --  54* 50*  CREATININE 2.22* 2.56* 2.60* 2.57*  --  2.28* 2.33*  CRP 21.8* 25.5* 22.4* 18.6*  --  18.0* 14.5*  PROCALCITON 4.11 3.02 1.94 1.32  --  0.90  --    INR  --   --   --   --  1.3*  --   --   MG 2.0 2.1 2.0 2.0  --   --   --   PHOS 1.6* 3.6 2.6 3.1  --   --   --   CALCIUM  8.8* 8.6* 8.8* 9.0  --  8.7* 8.7*      Recent Labs  Lab 11/18/24 0315 11/19/24 0339 11/20/24 0404 11/21/24 0406 11/21/24 0920 11/22/24 0516 11/23/24 0429  CRP 21.8* 25.5* 22.4* 18.6*  --  18.0* 14.5*  PROCALCITON 4.11 3.02 1.94 1.32  --  0.90  --  INR  --   --   --   --  1.3*  --   --   MG 2.0 2.1 2.0 2.0  --   --   --   CALCIUM  8.8* 8.6* 8.8* 9.0  --  8.7* 8.7*    --------------------------------------------------------------------------------------------------------------- Lab Results  Component Value Date   CHOL 119 01/29/2024   HDL 61 01/29/2024   LDLCALC 39 01/29/2024   TRIG 94 01/29/2024   CHOLHDL 2.0 01/29/2024    Lab Results  Component Value Date   HGBA1C 6.2 (H) 12/27/2023   No results for input(s): TSH, T4TOTAL, FREET4, T3FREE, THYROIDAB in the last 72 hours. No results for input(s): VITAMINB12, FOLATE, FERRITIN, TIBC, IRON , RETICCTPCT in the last 72 hours. ------------------------------------------------------------------------------------------------------------------ Cardiac Enzymes No results for input(s): CKMB, TROPONINI, MYOGLOBIN in the last 168 hours.  Invalid input(s): CK  Micro Results Recent Results (from the past 240 hours)  Blood Culture (routine x 2)     Status: None   Collection Time: 11/15/24 12:08 PM   Specimen: BLOOD RIGHT HAND  Result Value Ref Range Status   Specimen Description BLOOD RIGHT HAND  Final   Special Requests   Final    BOTTLES DRAWN AEROBIC AND ANAEROBIC Blood Culture results may not be optimal due to an inadequate volume of blood received in culture bottles   Culture   Final    NO GROWTH 5 DAYS Performed at [facility], [address], [place], [place] [address]    Report Status 11/20/2024 FINAL  Final  Blood Culture (routine x 2)     Status: None    Collection Time: 11/15/24 12:13 PM   Specimen: BLOOD LEFT HAND  Result Value Ref Range Status   Specimen Description BLOOD LEFT HAND  Final   Special Requests   Final    BOTTLES DRAWN AEROBIC AND ANAEROBIC Blood Culture adequate volume   Culture   Final    NO GROWTH 5 DAYS Performed at [facility], [address], [place], [place] [address]    Report Status 11/20/2024 FINAL  Final  Resp panel by RT-PCR (RSV, Flu A&B, Covid) Anterior Nasal Swab     Status: None   Collection Time: 11/15/24 12:46 PM   Specimen: Anterior Nasal Swab  Result Value Ref Range Status   SARS Coronavirus 2 by RT PCR NEGATIVE NEGATIVE Final   Influenza A by PCR NEGATIVE NEGATIVE Final   Influenza B by PCR NEGATIVE NEGATIVE Final    Comment: (NOTE) The Xpert Xpress SARS-CoV-2/FLU/RSV plus assay is intended as an aid in the diagnosis of influenza from Nasopharyngeal swab specimens and should not be used as a sole basis for treatment. Nasal washings and aspirates are unacceptable for Xpert Xpress SARS-CoV-2/FLU/RSV testing.  Fact Sheet for Patients: bloggercourse.com  Fact Sheet for Healthcare Providers: seriousbroker.it  This test is not yet approved or cleared by the [place]  FDA and has been authorized for detection and/or diagnosis of SARS-CoV-2 by FDA under an Emergency Use Authorization (EUA). This EUA will remain in effect (meaning this test can be used) for the duration of the COVID-19 declaration under Section 564(b)(1) of the Act, 21 U.S.C. section 360bbb-3(b)(1), unless the authorization is terminated or revoked.     Resp Syncytial Virus by PCR NEGATIVE NEGATIVE Final    Comment: (NOTE) Fact Sheet for Patients: bloggercourse.com  Fact Sheet for Healthcare Providers: seriousbroker.it  This test is not yet approved or cleared by the [place]  FDA and has been authorized  for  detection and/or diagnosis of SARS-CoV-2 by FDA under an Emergency Use Authorization (EUA). This EUA will remain in effect (meaning this test can be used) for the duration of the COVID-19 declaration under Section 564(b)(1) of the Act, 21 U.S.C. section 360bbb-3(b)(1), unless the authorization is terminated or revoked.  Performed at [facility], [address], [place], [place] [address]   Respiratory (~20 pathogens) panel by PCR     Status: None   Collection Time: 11/15/24 12:46 PM   Specimen: Nasopharyngeal Swab; Respiratory  Result Value Ref Range Status   Adenovirus NOT DETECTED NOT DETECTED Final   Coronavirus 229E NOT DETECTED NOT DETECTED Final    Comment: (NOTE) The Coronavirus on the Respiratory Panel, DOES NOT test for the novel  Coronavirus (2019 nCoV)    Coronavirus HKU1 NOT DETECTED NOT DETECTED Final   Coronavirus NL63 NOT DETECTED NOT DETECTED Final   Coronavirus OC43 NOT DETECTED NOT DETECTED Final   Metapneumovirus NOT DETECTED NOT DETECTED Final   Rhinovirus / Enterovirus NOT DETECTED NOT DETECTED Final   Influenza A NOT DETECTED NOT DETECTED Final   Influenza B NOT DETECTED NOT DETECTED Final   Parainfluenza Virus 1 NOT DETECTED NOT DETECTED Final   Parainfluenza Virus 2 NOT DETECTED NOT DETECTED Final   Parainfluenza Virus 3 NOT DETECTED NOT DETECTED Final   Parainfluenza Virus 4 NOT DETECTED NOT DETECTED Final   Respiratory Syncytial Virus NOT DETECTED NOT DETECTED Final   Bordetella pertussis NOT DETECTED NOT DETECTED Final   Bordetella Parapertussis NOT DETECTED NOT DETECTED Final   Chlamydophila pneumoniae NOT DETECTED NOT DETECTED Final   Mycoplasma pneumoniae NOT DETECTED NOT DETECTED Final    Comment: Performed at [facility], [address], [place], [place] [address]  MRSA Next Gen by PCR, Nasal     Status: None   Collection Time: 11/16/24 12:40 PM   Specimen: Nasal Mucosa; Nasal Swab  Result Value Ref Range Status   MRSA by PCR Next  Gen NOT DETECTED NOT DETECTED Final    Comment: (NOTE) The GeneXpert MRSA Assay (FDA approved for NASAL specimens only), is one component of a comprehensive MRSA colonization surveillance program. It is not intended to diagnose MRSA infection nor to guide or monitor treatment for MRSA infections. Test performance is not FDA approved in patients less than 11 years old. Performed at [facility], [address], [place], [place] [address]   Respiratory (~20 pathogens) panel by PCR     Status: None   Collection Time: 11/18/24  5:28 AM   Specimen: Nasopharyngeal Swab; Respiratory  Result Value Ref Range Status   Adenovirus NOT DETECTED NOT DETECTED Final   Coronavirus 229E NOT DETECTED NOT DETECTED Final    Comment: (NOTE) The Coronavirus on the Respiratory Panel, DOES NOT test for the novel  Coronavirus (2019 nCoV)    Coronavirus HKU1 NOT DETECTED NOT DETECTED Final   Coronavirus NL63 NOT DETECTED NOT DETECTED Final   Coronavirus OC43 NOT DETECTED NOT DETECTED Final   Metapneumovirus NOT DETECTED NOT DETECTED Final   Rhinovirus / Enterovirus NOT DETECTED NOT DETECTED Final   Influenza A NOT DETECTED NOT DETECTED Final   Influenza B NOT DETECTED NOT DETECTED Final   Parainfluenza Virus 1 NOT DETECTED NOT DETECTED Final   Parainfluenza Virus 2 NOT DETECTED NOT DETECTED Final   Parainfluenza Virus 3 NOT DETECTED NOT DETECTED Final   Parainfluenza Virus 4 NOT DETECTED NOT DETECTED Final   Respiratory Syncytial Virus NOT DETECTED  NOT DETECTED Final   Bordetella pertussis NOT DETECTED NOT DETECTED Final   Bordetella Parapertussis NOT DETECTED NOT DETECTED Final   Chlamydophila pneumoniae NOT DETECTED NOT DETECTED Final   Mycoplasma pneumoniae NOT DETECTED NOT DETECTED Final    Comment: Performed at [facility], [address] [place], [place] [address]  Aerobic/Anaerobic Culture w Gram Stain (surgical/deep wound)     Status: None (Preliminary result)   Collection Time:  11/21/24  2:27 PM   Specimen: Abscess  Result Value Ref Range Status   Specimen Description ABSCESS LEFT RENAL  Final   Special Requests NONE  Final   Gram Stain   Final    ABUNDANT WBC PRESENT, PREDOMINANTLY PMN NO ORGANISMS SEEN Performed at [facility], [address], [place], [place] [address]    Culture   Final    RARE KLEBSIELLA PNEUMONIAE NO ANAEROBES ISOLATED; CULTURE IN PROGRESS FOR 5 DAYS    Report Status PENDING  Incomplete   Organism ID, Bacteria KLEBSIELLA PNEUMONIAE  Final      Susceptibility   Klebsiella pneumoniae - MIC*    AMPICILLIN  RESISTANT Resistant     CEFAZOLIN (NON-URINE) 2 SENSITIVE Sensitive     CEFEPIME  <=0.12 SENSITIVE Sensitive     ERTAPENEM <=0.12 SENSITIVE Sensitive     CEFTRIAXONE  <=0.25 SENSITIVE Sensitive     CIPROFLOXACIN <=0.06 SENSITIVE Sensitive     GENTAMICIN <=1 SENSITIVE Sensitive     MEROPENEM  <=0.25 SENSITIVE Sensitive     TRIMETH/SULFA <=20 SENSITIVE Sensitive     AMPICILLIN /SULBACTAM <=2 SENSITIVE Sensitive     PIP/TAZO Value in next row Sensitive      <=4 SENSITIVEThis is a modified FDA-approved test that has been validated and its performance characteristics determined by the reporting laboratory.  This laboratory is certified under the Clinical Laboratory Improvement Amendments CLIA as qualified to perform high complexity clinical laboratory testing.    * RARE KLEBSIELLA PNEUMONIAE    Radiology Reports  VAS US  LOWER EXTREMITY VENOUS (DVT) Result Date: 11/24/2024  Lower Venous DVT Study Patient Name:  Levoy Geisen.  Date of Exam:   11/23/2024 Medical Rec #: [id number]          Accession #:    [id number] Date of Birth: 10/21/1941           Patient Gender: M Patient Age:   89 years Exam Location:  [facility] Procedure:      VAS US  LOWER EXTREMITY VENOUS (DVT) Referring Phys: Hadasa Gasner --------------------------------------------------------------------------------  Indications: Persistent fever.  Risk Factors:  Cancer; Bladder 2019. DVT 02/22/24 left popliteal. IVC filter placed 4/112025 by [facility]. PE 02/15/2024 Comparison Study: Prior negative bilateral LEV done 03/05/24 (resolution of left                   popliteal DVT noted on study 02/22/24) Performing Technologist: Alberta Lis RVS  Examination Guidelines: A complete evaluation includes B-mode imaging, spectral Doppler, color Doppler, and power Doppler as needed of all accessible portions of each vessel. Bilateral testing is considered an integral part of a complete examination. Limited examinations for reoccurring indications may be performed as noted. The reflux portion of the exam is performed with the patient in reverse Trendelenburg.  +---------+---------------+---------+-----------+----------+---------------+ RIGHT    CompressibilityPhasicitySpontaneityPropertiesThrombus Aging  +---------+---------------+---------+-----------+----------+---------------+ CFV      Full           Yes      Yes                                  +---------+---------------+---------+-----------+----------+---------------+  SFJ      Full                                                         +---------+---------------+---------+-----------+----------+---------------+ FV Prox  Full           Yes      Yes                                  +---------+---------------+---------+-----------+----------+---------------+ FV Mid   Full                                                         +---------+---------------+---------+-----------+----------+---------------+ FV DistalFull                                                         +---------+---------------+---------+-----------+----------+---------------+ PFV      Full           Yes      Yes                                  +---------+---------------+---------+-----------+----------+---------------+ POP      Full           Yes      Yes                                   +---------+---------------+---------+-----------+----------+---------------+ PTV      Full                                                         +---------+---------------+---------+-----------+----------+---------------+ PERO     Full                                                         +---------+---------------+---------+-----------+----------+---------------+ Gastroc  Full                                                         +---------+---------------+---------+-----------+----------+---------------+ SSV      Full                                         Thickened walls +---------+---------------+---------+-----------+----------+---------------+   +---------+---------------+---------+-----------+----------+--------------+ LEFT  CompressibilityPhasicitySpontaneityPropertiesThrombus Aging +---------+---------------+---------+-----------+----------+--------------+ CFV      Full           Yes      Yes                                 +---------+---------------+---------+-----------+----------+--------------+ SFJ      Full                                                        +---------+---------------+---------+-----------+----------+--------------+ FV Prox  Full           Yes      Yes                                 +---------+---------------+---------+-----------+----------+--------------+ FV Mid   Full           Yes      Yes                                 +---------+---------------+---------+-----------+----------+--------------+ FV DistalFull           Yes      Yes                                 +---------+---------------+---------+-----------+----------+--------------+ PFV      Full           Yes      Yes                                 +---------+---------------+---------+-----------+----------+--------------+ POP      Full           Yes      Yes                                  +---------+---------------+---------+-----------+----------+--------------+ PTV      Full                                                        +---------+---------------+---------+-----------+----------+--------------+ PERO     Full                                                        +---------+---------------+---------+-----------+----------+--------------+ SSV      Full                                                        +---------+---------------+---------+-----------+----------+--------------+     Summary: RIGHT: - There is no evidence of deep  vein thrombosis in the lower extremity.  - No cystic structure found in the popliteal fossa.  LEFT: - There is no evidence of deep vein thrombosis in the lower extremity.  - No cystic structure found in the popliteal fossa.  *See table(s) above for measurements and observations. Electronically signed by Gaile New MD on 11/24/2024 at 8:34:40 AM.    Final       Signature  -   Brayton Lye M.D on 11/24/2024 at 4:10 PM   -  To page go to [email or web address]        "

## 2024-11-24 NOTE — Progress Notes (Signed)
 Physical Therapy Treatment Patient Details Name: Jerry [name]. MRN: [id number] DOB: [birth year] Today's Date: 11/24/2024   History of Present Illness 84 y.o. male admitted 11/15/24 with weakness, lethargy. Workup for sepsis secondary to PNA. Pt with L renal fluid collection s/p aspiration & drain placement. PMH includes vascular dementia, parkinsonism, HTN, CKD, DVT/PE s/p IVC filter, GIB, HLD, prostate CA, asthma.   PT Comments  Pt progressing well with mobility. Today's session focused on transfer and gait training with RW, pt requiring CGA-modA with mobility; c/o dizziness with standing, notable drop in BP (see below). Pt remains limited by generalized weakness, decreased activity tolerance, poor balance strategies/postural reactions and impaired cognition. Continue to recommend post-acute rehab services (< 3 hrs/day) to maximize functional mobility and independence prior to return home.  Sitting BP 129/74 Standing BP 107/64 HR 90s-100s; SpO2 96% on RA      If plan is discharge home, recommend the following: A lot of help with walking and/or transfers;A lot of help with bathing/dressing/bathroom;Assistance with cooking/housework;Assist for transportation;Help with stairs or ramp for entrance;Direct supervision/assist for medications management;Direct supervision/assist for financial management   Can travel by private vehicle     No  Equipment Recommendations  TBD next venue - potential Rolling walker (2 wheels);BSC/3in1;Wheelchair (measurements PT);Wheelchair cushion (measurements PT)    Recommendations for Other Services       Precautions / Restrictions Precautions Precautions: Fall;Other (comment) Recall of Precautions/Restrictions: Intact Precaution/Restrictions Comments: watch BP Restrictions Weight Bearing Restrictions Per Provider Order: No     Mobility  Bed Mobility Overal bed mobility: Needs Assistance Bed Mobility: Supine to Sit, Sit to Supine     Supine to sit:  Mod assist, HOB elevated, Used rails Sit to supine: Min assist   General bed mobility comments: increased time and effort for movement initiation and sequencing, minA for LE management, modA for HHA to elevate trunk; minA for BLE management return to supine    Transfers Overall transfer level: Needs assistance Equipment used: Rolling walker (2 wheels) Transfers: Sit to/from Stand, Bed to chair/wheelchair/BSC Sit to Stand: Mod assist, Min assist, Contact guard assist   Step pivot transfers: Min assist, Contact guard assist       General transfer comment: initial modA for trunk elevation standing from elevated bed to RW, cues for hand placement with fair carryover to subsequent trials; multiple sit<>stands from EOB (4x) and BSC (1x) progressing to minA then CGA. step pivot bed<>BSC with RW and CGA-minA for stability    Ambulation/Gait Ambulation/Gait assistance: Min assist Gait Distance (Feet): 10 Feet Assistive device: Rolling walker (2 wheels) Gait Pattern/deviations: Step-to pattern, Step-through pattern, Decreased stride length, Shuffle, Trunk flexed Gait velocity: Decreased     General Gait Details: short, shuffling steps with RW and intermittent minA for stability and RW management; pt self-correcting upright posture with faded cues, cues for big steps; distance limited by c/o dizziness with associated hypotension   Stairs             Wheelchair Mobility     Tilt Bed    Modified Rankin (Stroke Patients Only)       Balance Overall balance assessment: Needs assistance Sitting-balance support: No upper extremity supported, Feet supported Sitting balance-Leahy Scale: Fair     Standing balance support: Reliant on assistive device for balance, Bilateral upper extremity supported, During functional activity Standing balance-Leahy Scale: Poor Standing balance comment: reliant on UE support, dependent for posterior pericare while standing  Communication Communication Communication: No apparent difficulties  Cognition Arousal: Alert Behavior During Therapy: Flat affect   PT - Cognitive impairments: Sequencing, Problem solving, Initiation, History of cognitive impairments                       PT - Cognition Comments: h/o vascular dementia. flat affect but pleasant and cooperative, motivated to participate. improved speech/communication this session. increased time for movement initiation and apparent difficulty sequencing; per chart, h/o parkinsonism Following commands: Intact Following commands impaired: Follows one step commands with increased time    Cueing Cueing Techniques: Verbal cues, Tactile cues, Visual cues  Exercises      General Comments General comments (skin integrity, edema, etc.): pt with some bowel incontinence requiring assist to stand and get cleaned up; then pt c/o gas and encouraged to try [facility], pt able to have large BM (CNA notified), assist for pericare/toileting. pt c/o dizziness with standing activity; sitting BP 129/74, standing BP 107/64; HR 90s-100s; SpO2 96% on RA      Pertinent Vitals/Pain Pain Assessment Pain Assessment: Faces Faces Pain Scale: Hurts a little bit Pain Location: back (my kidneys) Pain Descriptors / Indicators: Discomfort Pain Intervention(s): Monitored during session, Limited activity within patient's tolerance    Home Living                          Prior Function            PT Goals (current goals can now be found in the care plan section) Acute Rehab PT Goals Patient Stated Goal: feel better PT Goal Formulation: With patient Time For Goal Achievement: 12/01/24 Potential to Achieve Goals: Good Progress towards PT goals: Progressing toward goals    Frequency    Min 2X/week      PT Plan      Co-evaluation              AM-PAC PT 6 Clicks Mobility   Outcome Measure  Help needed turning from your back to your  side while in a flat bed without using bedrails?: A Little Help needed moving from lying on your back to sitting on the side of a flat bed without using bedrails?: A Lot Help needed moving to and from a bed to a chair (including a wheelchair)?: A Little Help needed standing up from a chair using your arms (e.g., wheelchair or bedside chair)?: A Lot Help needed to walk in hospital room?: Total Help needed climbing 3-5 steps with a railing? : Total 6 Click Score: 12    End of Session Equipment Utilized During Treatment: Gait belt Activity Tolerance: Patient tolerated treatment well Patient left: in bed;with call bell/phone within reach;with bed alarm set;Other (comment) Nurse Communication: Mobility status PT Visit Diagnosis: Unsteadiness on feet (R26.81);Muscle weakness (generalized) (M62.81)     Time: 1126-1200 PT Time Calculation (min) (ACUTE ONLY): 34 min  Charges:    $Therapeutic Activity: 23-37 mins PT General Charges $$ ACUTE PT VISIT: 1 Visit                      Darice Almas, PT, DPT Acute Rehabilitation Services  Personal: Secure Chat Rehab Office: [phone]  Darice LITTIE Almas 11/24/2024, 2:20 PM

## 2024-11-24 NOTE — Plan of Care (Signed)

## 2024-11-24 NOTE — TOC Progression Note (Signed)
 Transition of Care (TOC) - Progression Note    Patient Details  Name: Jerry [name]. MRN: [id number] Date of Birth: 08-25-1941  Transition of [facility]) CM/SW Contact  Inocente GORMAN Kindle, LCSW Phone Number: 11/24/2024, 9:22 AM  Clinical Narrative:    CSW continuing to follow for medical stability. Updated Emmalene who will continue to monitor for bed availability.    Expected Discharge Plan: Skilled Nursing Facility Barriers to Discharge: Continued Medical Work up               Expected Discharge Plan and Services In-house Referral: Clinical Social Work   Post Acute Care Choice: Skilled Nursing Facility Living arrangements for the past 2 months: Single Family Home                                       Social Drivers of Health (SDOH) Interventions SDOH Screenings   Food Insecurity: No Food Insecurity (11/16/2024)  Housing: Low Risk (11/16/2024)  Transportation Needs: No Transportation Needs (11/16/2024)  Utilities: Not At Risk (11/16/2024)  Depression (PHQ2-9): Low Risk (04/14/2024)  Social Connections: Moderately Isolated (11/16/2024)  Tobacco Use: Low Risk (11/15/2024)    Readmission Risk Interventions    11/18/2024    3:02 PM 02/18/2024    2:20 PM 06/27/2023   12:12 PM  Readmission Risk Prevention Plan  Transportation Screening Complete Complete Complete  PCP or Specialist Appt within 5-7 Days   Complete  PCP or Specialist Appt within 3-5 Days  Complete   Home Care Screening   Complete  Medication Review (RN CM)   Complete  HRI or Home Care Consult  Complete   Palliative Care Screening  Not Applicable   Medication Review (RN Care Manager) Complete Complete   PCP or Specialist appointment within 3-5 days of discharge Complete    HRI or Home Care Consult Complete    SW Recovery Care/Counseling Consult Complete    Palliative Care Screening Not Applicable    Skilled Nursing Facility Complete

## 2024-11-24 NOTE — Progress Notes (Signed)
 "   Referring Physician(s): Elgergawy, Brayton  Supervising Physician: Jenna Hacker  Patient Status:  [facility] - In-pt  Chief Complaint: s/p left renal abscess drain placement on 1/9 for left renal abscess  Subjective: Patient lying in bed. Wife at bedside. Patient denies concerns with drain. Anticipating discharge to rehab facility tomorrow.   Allergies: Patient has no known allergies.  Medications: Prior to Admission medications  Medication Sig Start Date End Date Taking? Authorizing Provider  albuterol  (VENTOLIN  HFA) 108 (90 Base) MCG/ACT inhaler Inhale 2 puffs into the lungs every 6 (six) hours as needed for wheezing or shortness of breath. 07/12/21  Yes Young, Reggy D, MD  ALPRAZolam  (XANAX ) 0.5 MG tablet Take 0.5 mg by mouth 2 (two) times daily as needed for anxiety.   Yes [provider]  amLODipine  (NORVASC ) 10 MG tablet Take 5 mg by mouth daily. 08/04/19  Yes [provider]  atorvastatin  (LIPITOR) 20 MG tablet Take 1 tablet (20 mg total) by mouth daily. 02/04/24 11/16/24 Yes Christobal Guadalajara, MD  Chlorphen-PE-Acetaminophen  4-10-325 MG TABS Take 1 tablet by mouth daily as needed (For sinus). 02/20/24  Yes [provider]  Cholecalciferol  50 MCG (2000 UT) TABS Take 2,000 Units by mouth daily.   Yes [provider]  Cyanocobalamin  (VITAMIN B12 PO) Take 1 tablet by mouth daily.   Yes [provider]  escitalopram  (LEXAPRO ) 10 MG tablet Take 10 mg by mouth daily.   Yes [provider]  FeFum-FePoly-FA-B Cmp-C-Biot (FOLIVANE-PLUS) CAPS Take 1 capsule by mouth daily.   Yes [provider]  furosemide  (LASIX ) 40 MG tablet Take 40 mg daily only if needed for swelling or weight gain. 04/24/24  Yes Patwardhan, Manish J, MD  ipratropium-albuterol  (DUONEB) 0.5-2.5 (3) MG/3ML SOLN Inhale 3 mLs into the lungs every 2 (two) hours as needed (SOB/Wheezing).   Yes [provider]  Magnesium  Oxide, Laxative, 500 MG TABS Take 1 tablet by  mouth daily.   Yes [provider]  memantine  (NAMENDA ) 10 MG tablet Take 10 mg by mouth 2 (two) times daily. 02/17/24  Yes [provider]  OLANZapine  (ZYPREXA ) 2.5 MG tablet Take 1 tablet (2.5 mg total) by mouth 2 (two) times daily as needed (agitation). 06/05/23  Yes Randol Simmonds, MD  pantoprazole  (PROTONIX ) 40 MG tablet Take 1 tablet (40 mg total) by mouth 2 (two) times daily. Patient taking differently: Take 40 mg by mouth 2 (two) times daily as needed (acid reflex). 02/18/24  Yes Mdala-Gausi, Masiku Agatha, MD  Probiotic Product (ULTRAFLORA IMMUNE HEALTH PO) Take 1 tablet by mouth daily.   Yes [provider]  DULERA  200-5 MCG/ACT AERO Inhale 2 puffs into the lungs 2 (two) times daily. Patient not taking: Reported on 11/16/2024    [provider]     Vital Signs: BP (!) 140/83 (BP Location: Right Arm)   Pulse 86   Temp 98.9 F (37.2 C) (Oral)   Resp 19   Ht 5' 11 (1.803 m)   Wt 185 lb (83.9 kg)   SpO2 96%   BMI 25.80 kg/m   Physical Exam Cardiovascular:     Rate and Rhythm: Normal rate.  Pulmonary:     Effort: Pulmonary effort is normal. No respiratory distress.  Abdominal:     Palpations: Abdomen is soft.  Skin:    General: Skin is warm and dry.     Comments: Left renal abscess drain- Scant serous drainage in bulb Flushes/aspirates easily Insertion site unremarkable Suture and stat lock in place  Dressed appropriately  Neurological:     Mental Status: He is alert.  Psychiatric:        Mood and Affect: Mood normal.        Behavior: Behavior normal.     Imaging: VAS US  LOWER EXTREMITY VENOUS (DVT) Result Date: 11/24/2024  Lower Venous DVT Study Patient Name:  Jerry [name].  Date of Exam:   11/23/2024 Medical Rec #: [id number]          Accession #:    [id number] Date of Birth: 10/29/1941           Patient Gender: M Patient Age:   84 years Exam Location:  [facility] Procedure:      VAS US  LOWER EXTREMITY VENOUS (DVT) Referring  Phys: DAWOOD ELGERGAWY --------------------------------------------------------------------------------  Indications: Persistent fever.  Risk Factors: Cancer; Bladder 2019. DVT 02/22/24 left popliteal. IVC filter placed 4/112025 by [facility]. PE 02/15/2024 Comparison Study: Prior negative bilateral LEV done 03/05/24 (resolution of left                   popliteal DVT noted on study 02/22/24) Performing Technologist: Alberta Lis RVS  Examination Guidelines: A complete evaluation includes B-mode imaging, spectral Doppler, color Doppler, and power Doppler as needed of all accessible portions of each vessel. Bilateral testing is considered an integral part of a complete examination. Limited examinations for reoccurring indications may be performed as noted. The reflux portion of the exam is performed with the patient in reverse Trendelenburg.  +---------+---------------+---------+-----------+----------+---------------+ RIGHT    CompressibilityPhasicitySpontaneityPropertiesThrombus Aging  +---------+---------------+---------+-----------+----------+---------------+ CFV      Full           Yes      Yes                                  +---------+---------------+---------+-----------+----------+---------------+ SFJ      Full                                                         +---------+---------------+---------+-----------+----------+---------------+ FV Prox  Full           Yes      Yes                                  +---------+---------------+---------+-----------+----------+---------------+ FV Mid   Full                                                         +---------+---------------+---------+-----------+----------+---------------+ FV DistalFull                                                         +---------+---------------+---------+-----------+----------+---------------+ PFV      Full           Yes      Yes                                   +---------+---------------+---------+-----------+----------+---------------+  POP      Full           Yes      Yes                                  +---------+---------------+---------+-----------+----------+---------------+ PTV      Full                                                         +---------+---------------+---------+-----------+----------+---------------+ PERO     Full                                                         +---------+---------------+---------+-----------+----------+---------------+ Gastroc  Full                                                         +---------+---------------+---------+-----------+----------+---------------+ SSV      Full                                         Thickened walls +---------+---------------+---------+-----------+----------+---------------+   +---------+---------------+---------+-----------+----------+--------------+ LEFT     CompressibilityPhasicitySpontaneityPropertiesThrombus Aging +---------+---------------+---------+-----------+----------+--------------+ CFV      Full           Yes      Yes                                 +---------+---------------+---------+-----------+----------+--------------+ SFJ      Full                                                        +---------+---------------+---------+-----------+----------+--------------+ FV Prox  Full           Yes      Yes                                 +---------+---------------+---------+-----------+----------+--------------+ FV Mid   Full           Yes      Yes                                 +---------+---------------+---------+-----------+----------+--------------+ FV DistalFull           Yes      Yes                                 +---------+---------------+---------+-----------+----------+--------------+ PFV      Full  Yes      Yes                                  +---------+---------------+---------+-----------+----------+--------------+ POP      Full           Yes      Yes                                 +---------+---------------+---------+-----------+----------+--------------+ PTV      Full                                                        +---------+---------------+---------+-----------+----------+--------------+ PERO     Full                                                        +---------+---------------+---------+-----------+----------+--------------+ SSV      Full                                                        +---------+---------------+---------+-----------+----------+--------------+     Summary: RIGHT: - There is no evidence of deep vein thrombosis in the lower extremity.  - No cystic structure found in the popliteal fossa.  LEFT: - There is no evidence of deep vein thrombosis in the lower extremity.  - No cystic structure found in the popliteal fossa.  *See table(s) above for measurements and observations. Electronically signed by Gaile New MD on 11/24/2024 at 8:34:40 AM.    Final    IR US  ABSCESS DRAIN PLACEMENT Result Date: 11/21/2024 INDICATION: 84 year old male with history of sepsis and suspected etiology of enlarging left renal fluid collection. EXAM: Ultrasound-guided left renal abscess drain placement MEDICATIONS: The patient is currently admitted to the hospital and receiving intravenous antibiotics. The antibiotics were administered within an appropriate time frame prior to the initiation of the procedure. ANESTHESIA/SEDATION: Fentanyl  25 mcg IV; Versed  0.5 mg IV Moderate Sedation Time:  13 The patient was continuously monitored during the procedure by the interventional radiology nurse under my direct supervision. COMPLICATIONS: None immediate. PROCEDURE: Informed written consent was obtained from the patient after a thorough discussion of the procedural risks, benefits and alternatives. All questions were  addressed. Maximal Sterile Barrier Technique was utilized including caps, mask, sterile gowns, sterile gloves, sterile drape, hand hygiene and skin antiseptic. A timeout was performed prior to the initiation of the procedure. Preprocedure ultrasound evaluation demonstrated a mildly complex ovoid fluid collection about the left kidney. The procedure was planned. Subdermal Local anesthesia was administered at the planned needle entry site. Under direct ultrasound visualization, local anesthetic was administered to the periphery of the lesion. A small skin nick was made. Under direct ultrasound visualization, an 18 gauge trocar needle was directed into the central aspect of the collection. Aspiration was performed which yielded purulent fluid. Given suspected infection in size of the collection, the decision  was made to place a drain. Therefore, an Amplatz wire was inserted through the needle and coiled in the collection. After serial dilation was performed, an 8 French skater drain was then placed with the pigtail portion coiled in the central aspect of the collection. Approximately 300 mL of purulent fluid was aspirated. A sample was sent to the lab for culture. The drain was placed to bulb suction. The drain was affixed to the skin with a 0 silk interrupted suture. A sterile bandage was applied. The patient tolerated the procedure well was transferred back to the floor in good condition. IMPRESSION: Technically successful ultrasound-guided left renal abscess 8 French drain placement. Ester Sides, MD [facility] Electronically Signed   By: Ester Sides M.D.   On: 11/21/2024 15:21    Labs:  CBC: Recent Labs    11/21/24 0406 11/22/24 0516 11/23/24 0429 11/24/24 0320  WBC 15.5* 17.3* 14.7* 12.0*  HGB 8.6* 8.9* 8.9* 8.6*  HCT 28.5* 29.1* 28.8* 27.7*  PLT 298 340 355 373    COAGS: Recent Labs    02/21/24 1755 11/15/24 1208 11/16/24 0305  11/21/24 0920  INR 1.6* 1.3* 1.4* 1.3*    BMP: Recent Labs    11/20/24 0404 11/21/24 0406 11/22/24 0516 11/23/24 0429  NA 142 141 140 142  K 4.2 4.6 4.3 4.4  CL 110 109 109 111  CO2 23 21* 21* 21*  GLUCOSE 110* 108* 103* 92  BUN 61* 60* 54* 50*  CALCIUM  8.8* 9.0 8.7* 8.7*  CREATININE 2.60* 2.57* 2.28* 2.33*  GFRNONAA 24* 24* 28* 27*    LIVER FUNCTION TESTS: Recent Labs    03/03/24 0444 04/14/24 1351 06/12/24 1230 11/15/24 1208  BILITOT 0.4 0.5 0.3 0.6  AST 17 24 11* 17  ALT 15 19 11 10   ALKPHOS 55 76 90 85  PROT 5.3* 7.4 6.8 6.9  ALBUMIN  2.6* 4.2 4.0 4.1    Assessment and Plan: Left renal abscess-   Drain Location: left flank Size: Fr size: 10 Fr Date of placement: 11/21/2024  Currently to: Drain collection device: suction bulb 24 hour output:  Output by Drain (mL) 11/22/24 0701 - 11/22/24 1900 11/22/24 1901 - 11/23/24 0700 11/23/24 0701 - 11/23/24 1900 11/23/24 1901 - 11/24/24 0700 11/24/24 0701 - 11/24/24 1545  Closed System Drain 1 Left Back 8 Fr.  0  40     Current examination: Scant serous drainage in bulb. Flushes/aspirates easily.  Insertion site unremarkable. Suture and stat lock in place. Dressed appropriately.   Plan: Continue TID flushes with 5 cc NS. Record output Q shift. Dressing changes QD or PRN if soiled.  Call IR APP or on call IR MD if difficulty flushing or sudden change in drain output.  Repeat imaging/possible drain injection once output < 10 mL/QD (excluding flush material). Consideration for drain removal if output is < 10 mL/QD (excluding flush material), pending discussion with the providing surgical service.  Discharge planning: Please contact IR APP or on call IR MD prior to patient d/c to ensure appropriate follow up plans are in place. Typically patient will follow up with IR clinic 10-14 days post d/c for repeat imaging/possible drain injection. IR scheduler will contact patient with date/time of appointment. Patient will  need to flush drain QD with 5 cc NS, record output QD, dressing changes every 2-3 days or earlier if soiled.   IR will continue to follow - please call with questions or concerns.     Electronically Signed:  Klaire Court B Satomi Buda, NP 11/24/2024, 3:42 PM   I spent a total of 15 Minutes at the the patient's bedside AND on the patient's hospital floor or unit, greater than 50% of which was counseling/coordinating care for drain follow up.  "

## 2024-11-25 ENCOUNTER — Other Ambulatory Visit (HOSPITAL_COMMUNITY): Payer: Self-pay

## 2024-11-25 ENCOUNTER — Inpatient Hospital Stay (HOSPITAL_COMMUNITY)

## 2024-11-25 DIAGNOSIS — R6520 Severe sepsis without septic shock: Secondary | ICD-10-CM | POA: Diagnosis not present

## 2024-11-25 DIAGNOSIS — A419 Sepsis, unspecified organism: Secondary | ICD-10-CM | POA: Diagnosis not present

## 2024-11-25 LAB — CBC
HCT: 27.6 % — ABNORMAL LOW (ref 39.0–52.0)
Hemoglobin: 8.5 g/dL — ABNORMAL LOW (ref 13.0–17.0)
MCH: 22 pg — ABNORMAL LOW (ref 26.0–34.0)
MCHC: 30.8 g/dL (ref 30.0–36.0)
MCV: 71.3 fL — ABNORMAL LOW (ref 80.0–100.0)
Platelets: 358 K/uL (ref 150–400)
RBC: 3.87 MIL/uL — ABNORMAL LOW (ref 4.22–5.81)
RDW: 15.7 % — ABNORMAL HIGH (ref 11.5–15.5)
WBC: 10.8 K/uL — ABNORMAL HIGH (ref 4.0–10.5)
nRBC: 0 % (ref 0.0–0.2)

## 2024-11-25 MED ORDER — CEPHALEXIN 500 MG PO CAPS
500.0000 mg | ORAL_CAPSULE | Freq: Two times a day (BID) | ORAL | Status: DC
  Administered 2024-11-26: 500 mg via ORAL
  Filled 2024-11-25: qty 1

## 2024-11-25 NOTE — Progress Notes (Signed)
 "    Subjective: Jerry [name] was resting on my arrival. Reviewed case and plan. Clear yellow urine from urostomy  Objective: Vital signs in last 24 hours: Temp:  [98.2 F (36.8 C)-98.8 F (37.1 C)] 98.7 F (37.1 C) (01/13 0800) Pulse Rate:  [86-95] 95 (01/13 0800) Resp:  [16-20] 17 (01/13 0800) BP: (132-154)/(72-92) 143/92 (01/13 0800) SpO2:  [91 %-100 %] 94 % (01/13 0800)  Assessment/Plan: # Left renal cyst versus abscess  CT imaging initially measured 7.5 x 6.4 cm on 11/15/2024, interval growth to 8.4 x 7.2 cm on reimaging 11/20/2024.  IR placed drain.   Rare Klebsiella (+) on renal cyst aspiration. S/p td Merrem  and 2d Ceftriaxone . Plan to oralize treatment today per primary team.   Only 5mL j/p output over last 24hrs. Recommend re-image today and consider drain removal. Outpt follow up direction placed in discharge instructions if drain stays.   Scheduled to discharge to SNF tomorrow. Discuss re-imaging in clinic approx 2 weeks.   Intake/Output from previous day: 01/12 0701 - 01/13 0700 In: 243 [P.O.:240; I.V.:3] Out: 2605 [Urine:2600; Drains:5]  Intake/Output this shift: No intake/output data recorded.  Physical Exam:  General: Alert and oriented CV: No cyanosis Lungs: equal chest rise Gu: urostomy draining clear yellow urine. J/p with scant serous fluid.   Lab Results: Recent Labs    11/23/24 0429 11/24/24 0320 11/25/24 0332  HGB 8.9* 8.6* 8.5*  HCT 28.8* 27.7* 27.6*   BMET Recent Labs    11/23/24 0429 11/24/24 0320 11/25/24 0332  NA 142  --   --   K 4.4  --   --   CL 111  --   --   CO2 21*  --   --   GLUCOSE 92  --   --   BUN 50*  --   --   CREATININE 2.33*  --   --   CALCIUM  8.7*  --   --   HGB 8.9* 8.6* 8.5*  WBC 14.7* 12.0* 10.8*     Studies/Results: VAS US  LOWER EXTREMITY VENOUS (DVT) Result Date: 11/24/2024  Lower Venous DVT Study Patient Name:  Jerry [name].  Date of Exam:   11/23/2024 Medical Rec #: [id number]          Accession #:     [id number] Date of Birth: [birth year]           Patient Gender: M Patient Age:   84 years Exam Location:  [facility] Procedure:      VAS US  LOWER EXTREMITY VENOUS (DVT) Referring Phys: DAWOOD ELGERGAWY --------------------------------------------------------------------------------  Indications: Persistent fever.  Risk Factors: Cancer; Bladder 2019. DVT 02/22/24 left popliteal. IVC filter placed 4/112025 by [facility]. PE 02/15/2024 Comparison Study: Prior negative bilateral LEV done 03/05/24 (resolution of left                   popliteal DVT noted on study 02/22/24) Performing Technologist: Alberta Lis RVS  Examination Guidelines: A complete evaluation includes B-mode imaging, spectral Doppler, color Doppler, and power Doppler as needed of all accessible portions of each vessel. Bilateral testing is considered an integral part of a complete examination. Limited examinations for reoccurring indications may be performed as noted. The reflux portion of the exam is performed with the patient in reverse Trendelenburg.  +---------+---------------+---------+-----------+----------+---------------+ RIGHT    CompressibilityPhasicitySpontaneityPropertiesThrombus Aging  +---------+---------------+---------+-----------+----------+---------------+ CFV      Full           Yes      Yes                                  +---------+---------------+---------+-----------+----------+---------------+  SFJ      Full                                                         +---------+---------------+---------+-----------+----------+---------------+ FV Prox  Full           Yes      Yes                                  +---------+---------------+---------+-----------+----------+---------------+ FV Mid   Full                                                         +---------+---------------+---------+-----------+----------+---------------+ FV DistalFull                                                          +---------+---------------+---------+-----------+----------+---------------+ PFV      Full           Yes      Yes                                  +---------+---------------+---------+-----------+----------+---------------+ POP      Full           Yes      Yes                                  +---------+---------------+---------+-----------+----------+---------------+ PTV      Full                                                         +---------+---------------+---------+-----------+----------+---------------+ PERO     Full                                                         +---------+---------------+---------+-----------+----------+---------------+ Gastroc  Full                                                         +---------+---------------+---------+-----------+----------+---------------+ SSV      Full                                         Thickened walls +---------+---------------+---------+-----------+----------+---------------+   +---------+---------------+---------+-----------+----------+--------------+ LEFT  CompressibilityPhasicitySpontaneityPropertiesThrombus Aging +---------+---------------+---------+-----------+----------+--------------+ CFV      Full           Yes      Yes                                 +---------+---------------+---------+-----------+----------+--------------+ SFJ      Full                                                        +---------+---------------+---------+-----------+----------+--------------+ FV Prox  Full           Yes      Yes                                 +---------+---------------+---------+-----------+----------+--------------+ FV Mid   Full           Yes      Yes                                 +---------+---------------+---------+-----------+----------+--------------+ FV DistalFull           Yes      Yes                                  +---------+---------------+---------+-----------+----------+--------------+ PFV      Full           Yes      Yes                                 +---------+---------------+---------+-----------+----------+--------------+ POP      Full           Yes      Yes                                 +---------+---------------+---------+-----------+----------+--------------+ PTV      Full                                                        +---------+---------------+---------+-----------+----------+--------------+ PERO     Full                                                        +---------+---------------+---------+-----------+----------+--------------+ SSV      Full                                                        +---------+---------------+---------+-----------+----------+--------------+     Summary: RIGHT: - There is no evidence of deep  vein thrombosis in the lower extremity.  - No cystic structure found in the popliteal fossa.  LEFT: - There is no evidence of deep vein thrombosis in the lower extremity.  - No cystic structure found in the popliteal fossa.  *See table(s) above for measurements and observations. Electronically signed by Gaile New MD on 11/24/2024 at 8:34:40 AM.    Final       LOS: 10 days   Ole Bourdon, NP [facility] Pager: [phone]  11/25/2024, 12:18 PM  "

## 2024-11-25 NOTE — TOC Progression Note (Addendum)
 Transition of Care (TOC) - Progression Note    Patient Details  Name: Jerry [name]. MRN: [id number] Date of Birth: July 27, 1941  [facility]) CM/SW Contact  Inocente GORMAN Kindle, LCSW Phone Number: 11/25/2024, 8:30 AM  Clinical Narrative:    [facility] does not have a bed for patient until tomorrow. CSW updated patient's wife.    Expected Discharge Plan: Skilled Nursing Facility Barriers to Discharge: Continued Medical Work up, bed availability                Expected Discharge Plan and Services In-house Referral: Clinical Social Work   Post Acute Care Choice: Skilled Nursing Facility Living arrangements for the past 2 months: Single Family Home                                       Social Drivers of Health (SDOH) Interventions SDOH Screenings   Food Insecurity: No Food Insecurity (11/16/2024)  Housing: Low Risk (11/16/2024)  Transportation Needs: No Transportation Needs (11/16/2024)  Utilities: Not At Risk (11/16/2024)  Depression (PHQ2-9): Low Risk (04/14/2024)  Social Connections: Moderately Isolated (11/16/2024)  Tobacco Use: Low Risk (11/15/2024)    Readmission Risk Interventions    11/18/2024    3:02 PM 02/18/2024    2:20 PM 06/27/2023   12:12 PM  Readmission Risk Prevention Plan  Transportation Screening Complete Complete Complete  PCP or Specialist Appt within 5-7 Days   Complete  PCP or Specialist Appt within 3-5 Days  Complete   Home Care Screening   Complete  Medication Review (RN CM)   Complete  HRI or Home Care Consult  Complete   Palliative Care Screening  Not Applicable   Medication Review (RN Care Manager) Complete Complete   PCP or Specialist appointment within 3-5 days of discharge Complete    HRI or Home Care Consult Complete    SW Recovery Care/Counseling Consult Complete    Palliative Care Screening Not Applicable    Skilled Nursing Facility Complete

## 2024-11-25 NOTE — Progress Notes (Signed)
 Occupational Therapy Treatment Patient Details Name: Jerry [name]. MRN: [id number] DOB: December 25, 1940 Today's Date: 11/25/2024   History of present illness 84 y.o. male admitted 11/15/24 with weakness, lethargy. Workup for sepsis secondary to PNA. Pt with L renal fluid collection s/p aspiration & drain placement. PMH includes vascular dementia, parkinsonism, HTN, CKD, DVT/PE s/p IVC filter, GIB, HLD, prostate CA, asthma.   OT comments  Patient with good progress toward patient focused goals.  Patient with improved bed mobility, Min A for sit to stand from slightly elevated position, and overall CGA to walk 10' to transport wheelchair at [facility] level.  Still needing up to Max A for lower body ADL, but with improving mobility, that should increase ADL independence over time.  OT will continue efforts in the acute setting to address deficits, and Patient will benefit from continued inpatient follow up therapy, <3 hours/day.      If plan is discharge home, recommend the following:  A lot of help with walking and/or transfers;A lot of help with bathing/dressing/bathroom;Assist for transportation;Assistance with cooking/housework   Equipment Recommendations  None recommended by OT    Recommendations for Other Services      Precautions / Restrictions Precautions Precautions: Fall Recall of Precautions/Restrictions: Intact Restrictions Weight Bearing Restrictions Per Provider Order: No       Mobility Bed Mobility Overal bed mobility: Needs Assistance Bed Mobility: Supine to Sit     Supine to sit: Min assist, Mod assist, HOB elevated, Used rails          Transfers Overall transfer level: Needs assistance Equipment used: Rolling walker (2 wheels) Transfers: Sit to/from Stand, Bed to chair/wheelchair/BSC Sit to Stand: Min assist, From elevated surface     Step pivot transfers: Min assist           Balance Overall balance assessment: Needs assistance Sitting-balance support: No  upper extremity supported, Feet supported Sitting balance-Leahy Scale: Fair     Standing balance support: Reliant on assistive device for balance Standing balance-Leahy Scale: Poor                             ADL either performed or assessed with clinical judgement   ADL Overall ADL's : Needs assistance/impaired Eating/Feeding: Set up;Sitting   Grooming: Set up;Sitting   Upper Body Bathing: Set up;Sitting   Lower Body Bathing: Moderate assistance;Sit to/from stand   Upper Body Dressing : Minimal assistance;Sitting   Lower Body Dressing: Moderate assistance;Maximal assistance;Sit to/from stand   Toilet Transfer: Minimal assistance;BSC/3in1;Rolling walker (2 wheels);Ambulation                  Extremity/Trunk Assessment Upper Extremity Assessment Upper Extremity Assessment: Overall WFL for tasks assessed   Lower Extremity Assessment Lower Extremity Assessment: Defer to PT evaluation   Cervical / Trunk Assessment Cervical / Trunk Assessment: Kyphotic    Vision Patient Visual Report: No change from baseline     Perception Perception Perception: Not tested   Praxis Praxis Praxis: Not tested   Communication Communication Communication: No apparent difficulties   Cognition Arousal: Alert Behavior During Therapy: WFL for tasks assessed/performed Cognition: No apparent impairments                               Following commands: Intact Following commands impaired: Only follows one step commands consistently, Follows multi-step commands with increased time      Cueing  Cueing Techniques: Verbal cues, Gestural cues  Exercises      Shoulder Instructions       General Comments  VSS on RA    Pertinent Vitals/ Pain       Pain Assessment Pain Assessment: Faces Faces Pain Scale: No hurt Pain Intervention(s): Monitored during session                                                          Frequency   Min 2X/week        Progress Toward Goals  OT Goals(current goals can now be found in the care plan section)  Progress towards OT goals: Progressing toward goals  Acute Rehab OT Goals OT Goal Formulation: With patient Time For Goal Achievement: 12/02/24 Potential to Achieve Goals: Good  Plan      Co-evaluation                 AM-PAC OT 6 Clicks Daily Activity     Outcome Measure   Help from another person eating meals?: None Help from another person taking care of personal grooming?: A Little Help from another person toileting, which includes using toliet, bedpan, or urinal?: A Lot Help from another person bathing (including washing, rinsing, drying)?: A Lot Help from another person to put on and taking off regular upper body clothing?: A Lot Help from another person to put on and taking off regular lower body clothing?: A Lot 6 Click Score: 15    End of Session Equipment Utilized During Treatment: Rolling walker (2 wheels);Gait belt  OT Visit Diagnosis: Unsteadiness on feet (R26.81);Muscle weakness (generalized) (M62.81)   Activity Tolerance Patient tolerated treatment well   Patient Left Other (comment) (Transport in room for scheduled CT - transferred to wheelchair)   Nurse Communication Mobility status        Time: 1400-1420 OT Time Calculation (min): 20 min  Charges: OT General Charges $OT Visit: 1 Visit OT Treatments $Therapeutic Activity: 8-22 mins  11/25/2024  RP, OTR/L  Acute Rehabilitation Services  Office:  [phone]   Charlie JONETTA Halsted 11/25/2024, 2:29 PM

## 2024-11-25 NOTE — Plan of Care (Signed)
  Problem: Fluid Volume: Goal: Hemodynamic stability will improve Outcome: Progressing   Problem: Clinical Measurements: Goal: Diagnostic test results will improve Outcome: Progressing   Problem: Education: Goal: Knowledge of General Education information will improve Description: Including pain rating scale, medication(s)/side effects and non-pharmacologic comfort measures Outcome: Progressing   Problem: Clinical Measurements: Goal: Ability to maintain clinical measurements within normal limits will improve Outcome: Progressing

## 2024-11-25 NOTE — Progress Notes (Signed)
 " Progress Note   Patient: Jerry [name]. 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  Patient was febrile on arrival to the ED, diagnosed with sepsis and started on IV antibiotics for infection, likely pulmonary source.  As well questionable infected left renal cyst, improved after drain insertion   Subjective:  No nausea, no vomiting,  remains afebrile, improved oral intake  Assessment and Plan:  Sepsis POA community  acquired pneumonia  Bronchiectasis exacerbation in a patient with history of smoking in the past and evidence of bronchiectasis on imaging -Chest/abdomen/pelvis significant for bronchiectasis with consolidation concerning for pneumonia . - With acute illness, frail, deconditioned and bronchiectasis, with significantly elevated CRP, Pro-Cal and leukocytosis, so antibiotics has been broadened, treated  with meropenem , Flagyl  and Zyvox , continue with meropenem , stop Zyvox  and Flagyl , please see discussion below - Legionella antigen and strep pneumonia negative  Left renal fluid collection (cyst versus abscess) is likely infected left renal cyst - Patient remains febrile, despite being on appropriate antibiotic coverage, with significant leukocytosis, elevated CRP, imaging on admission with left renal fluid collection concerning for cyst versus abscess, (appears to be increasing in size, was present in 01/2024), discussed with urology, repeat imaging significant for increased in size (no IV contrast due to renal sufficiency), they recommend aspiration by IR, IR consulted.   This post ultrasound-guided drain insertion, 300 cc with immediate output, continues to drain, but it is less by the day, 40 cc output over last 24 hours, this is most likely infected renal cyst, growing Klebsiella, sensitive to cephalosporin, so antibiotic has been narrowed to Rocephin  , very likely will DC either today or tomorrow, so we will transition to Keflex  total of 7 days.   Fever - Patient persistently febrile for a few days upon presentation, most likely due to above .  He is afebrile for last 2 days. - Venous Doppler is negative   History of pulmonary embolism   - CT angio chest 02/15/2024 showed bilateral lower lobe PE, has IVC filter.  VQ scan this admission negative.  Venous Dopplers negative this admission.  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) - patient has history of CKD3b. Baseline creat rose to 2, AKI due to sepsis improved after hydration.  Prediabetes - Last A1c 6.2% on 12/27/2023, Regular diet for now given poor p.o. intake.  PCP to monitor.  Vascular dementia (HCC) and parkinsonism- Continue Xanax  as needed Lexapro , Namenda , Zyprexa , at risk for delirium minimize benzodiazepines and narcotics.  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        Physical Exam: BP 124/74 (BP Location: Right Arm)   Pulse 90   Temp 98.6 F (37 C) (Oral)   Resp (!) 21   Ht 5' 11 (1.803 m)   Wt 83.9 kg   SpO2 100%   BMI 25.80 kg/m     Awake, alert, oriented x 3, frail Good air entry Regular rate and rhythm  Abdomen soft  Kidney drain with minimal serous output. No Cyanosis, Clubbing or edema, No new Rash or bruise       Data Review:   Patient Lines/Drains/Airways Status     Active Line/Drains/Airways     Name Placement date Placement time Site Days   Peripheral IV  11/20/24 22 G 1.75 Anterior;Left;Proximal Forearm 11/20/24  1453  Forearm  5   Closed System Drain 1 Left Back 8 Fr. 11/21/24  1347  Back  4   Urostomy Other (Comment) RLQ --  --  RLQ  --             DVT prophylaxis.  Lovenox .   Family communication.  discussed with wife by phone 1/12  Inpatient Medications  Scheduled Meds:  (feeding supplement) PROSource Plus  30 mL Oral BID BM   amLODipine   10 mg Oral Daily   enoxaparin  (LOVENOX ) injection  30 mg Subcutaneous Daily   escitalopram   10 mg Oral Daily   fluticasone  furoate-vilanterol  1 puff Inhalation Daily   memantine   10 mg Oral BID   sodium chloride  flush  3 mL Intravenous Q12H   Continuous Infusions:  cefTRIAXone  (ROCEPHIN )  IV 2 g (11/25/24 1119)   PRN Meds:.acetaminophen  **OR** acetaminophen , ALPRAZolam , hydrALAZINE , levalbuterol , ondansetron  **OR** ondansetron  (ZOFRAN ) IV, [EXPIRED] sodium chloride  **FOLLOWED BY** sodium chloride   DVT Prophylaxis  enoxaparin  (LOVENOX ) injection 30 mg Start: 11/22/24 1445   Recent Labs  Lab 11/19/24 0339 11/21/24 0406 11/22/24 0516 11/23/24 0429 11/24/24 0320 11/25/24 0332  WBC 18.6* 15.5* 17.3* 14.7* 12.0* 10.8*  HGB 8.9* 8.6* 8.9* 8.9* 8.6* 8.5*  HCT 29.1* 28.5* 29.1* 28.8* 27.7* 27.6*  PLT 251 298 340 355 373 358  MCV 73.3* 72.9* 72.4* 71.8* 71.2* 71.3*  MCH 22.4* 22.0* 22.1* 22.2* 22.1* 22.0*  MCHC 30.6 30.2 30.6 30.9 31.0 30.8  RDW 16.4* 15.9* 16.0* 15.9* 15.9* 15.7*  LYMPHSABS 0.9  --   --   --   --   --   MONOABS 2.4*  --   --   --   --   --   EOSABS 0.3  --   --   --   --   --   BASOSABS 0.0  --   --   --   --   --     Recent Labs  Lab 11/19/24 0339 11/20/24 0404 11/21/24 0406 11/21/24 0920 11/22/24 0516 11/23/24 0429  NA 142 142 141  --  140 142  K 4.0 4.2 4.6  --  4.3 4.4  CL 109 110 109  --  109 111  CO2 23 23 21*  --  21* 21*  ANIONGAP 9 9 12   --  10 10  GLUCOSE 132* 110* 108*  --  103* 92  BUN 62* 61* 60*  --  54* 50*  CREATININE 2.56* 2.60*  2.57*  --  2.28* 2.33*  CRP 25.5* 22.4* 18.6*  --  18.0* 14.5*  PROCALCITON 3.02 1.94 1.32  --  0.90  --   INR  --   --   --  1.3*  --   --   MG 2.1 2.0 2.0  --   --   --   PHOS 3.6 2.6 3.1  --   --   --   CALCIUM  8.6* 8.8* 9.0  --  8.7* 8.7*      Recent Labs  Lab 11/19/24 0339 11/20/24 0404 11/21/24 0406 11/21/24 0920 11/22/24 0516 11/23/24 0429  CRP 25.5* 22.4* 18.6*  --  18.0* 14.5*  PROCALCITON 3.02 1.94 1.32  --  0.90  --   INR  --   --   --  1.3*  --   --   MG 2.1 2.0 2.0  --   --   --   CALCIUM  8.6* 8.8* 9.0  --  8.7* 8.7*    --------------------------------------------------------------------------------------------------------------- Lab Results  Component Value Date   CHOL 119 01/29/2024   HDL 61 01/29/2024   LDLCALC 39 01/29/2024   TRIG 94 01/29/2024   CHOLHDL 2.0 01/29/2024    Lab Results  Component Value Date   HGBA1C 6.2 (H) 12/27/2023   No results for input(s): TSH, T4TOTAL, FREET4, T3FREE, THYROIDAB in the last 72 hours. No results for input(s): VITAMINB12, FOLATE, FERRITIN, TIBC, IRON , RETICCTPCT in the last 72 hours. ------------------------------------------------------------------------------------------------------------------ Cardiac Enzymes No results for input(s): CKMB, TROPONINI, MYOGLOBIN in the last 168 hours.  Invalid input(s): CK  Micro Results Recent Results (from the past 240 hours)  MRSA Next Gen by PCR, Nasal     Status: None   Collection Time: 11/16/24 12:40 PM   Specimen: Nasal Mucosa; Nasal Swab  Result Value Ref Range Status   MRSA by PCR Next Gen NOT DETECTED NOT DETECTED Final    Comment: (NOTE) The GeneXpert MRSA Assay (FDA approved for NASAL specimens only), is one component of a comprehensive MRSA colonization surveillance program. It is not intended to diagnose MRSA infection nor to guide or monitor treatment for MRSA infections. Test performance is not FDA approved in patients less  than 34 years old. Performed at [facility], [address] [place], [place] [address]   Respiratory (~20 pathogens) panel by PCR     Status: None   Collection Time: 11/18/24  5:28 AM   Specimen: Nasopharyngeal Swab; Respiratory  Result Value Ref Range Status   Adenovirus NOT DETECTED NOT DETECTED Final   Coronavirus 229E NOT DETECTED NOT DETECTED Final    Comment: (NOTE) The Coronavirus on the Respiratory Panel, DOES NOT test for the novel  Coronavirus (2019 nCoV)    Coronavirus HKU1 NOT DETECTED NOT DETECTED Final   Coronavirus NL63 NOT DETECTED NOT DETECTED Final   Coronavirus OC43 NOT DETECTED NOT DETECTED Final   Metapneumovirus NOT DETECTED NOT DETECTED Final   Rhinovirus / Enterovirus NOT DETECTED NOT DETECTED Final   Influenza A NOT DETECTED NOT DETECTED Final   Influenza B NOT DETECTED NOT DETECTED Final   Parainfluenza Virus 1 NOT DETECTED NOT DETECTED Final   Parainfluenza Virus 2 NOT DETECTED NOT DETECTED Final   Parainfluenza Virus 3 NOT DETECTED NOT DETECTED Final   Parainfluenza Virus 4 NOT DETECTED NOT DETECTED Final   Respiratory Syncytial Virus NOT DETECTED NOT DETECTED Final   Bordetella pertussis NOT DETECTED NOT DETECTED Final   Bordetella Parapertussis NOT DETECTED NOT DETECTED Final   Chlamydophila pneumoniae NOT DETECTED NOT DETECTED Final   Mycoplasma pneumoniae NOT DETECTED NOT DETECTED Final    Comment: Performed at [facility], [address], [place], [place] [address]  Aerobic/Anaerobic Culture w Gram Stain (surgical/deep wound)     Status: None (Preliminary result)   Collection Time: 11/21/24  2:27 PM   Specimen: Abscess  Result Value Ref Range Status   Specimen Description ABSCESS LEFT RENAL  Final   Special Requests NONE  Final   Gram Stain   Final    ABUNDANT WBC PRESENT, PREDOMINANTLY PMN NO ORGANISMS SEEN Performed at [facility], [address] [place], [place] [address]  Culture   Final    RARE KLEBSIELLA  PNEUMONIAE CULTURE REINCUBATED FOR BETTER GROWTH NO ANAEROBES ISOLATED; CULTURE IN PROGRESS FOR 5 DAYS    Report Status PENDING  Incomplete   Organism ID, Bacteria KLEBSIELLA PNEUMONIAE  Final      Susceptibility   Klebsiella pneumoniae - MIC*    AMPICILLIN  RESISTANT Resistant     CEFAZOLIN (NON-URINE) 2 SENSITIVE Sensitive     CEFEPIME  <=0.12 SENSITIVE Sensitive     ERTAPENEM <=0.12 SENSITIVE Sensitive     CEFTRIAXONE  <=0.25 SENSITIVE Sensitive     CIPROFLOXACIN <=0.06 SENSITIVE Sensitive     GENTAMICIN <=1 SENSITIVE Sensitive     MEROPENEM  <=0.25 SENSITIVE Sensitive     TRIMETH/SULFA <=20 SENSITIVE Sensitive     AMPICILLIN /SULBACTAM <=2 SENSITIVE Sensitive     PIP/TAZO Value in next row Sensitive      <=4 SENSITIVEThis is a modified FDA-approved test that has been validated and its performance characteristics determined by the reporting laboratory.  This laboratory is certified under the Clinical Laboratory Improvement Amendments CLIA as qualified to perform high complexity clinical laboratory testing.    * RARE KLEBSIELLA PNEUMONIAE    Radiology Reports  No results found.     Signature  -   Brayton Lye M.D on 11/25/2024 at 3:23 PM   -  To page go to [email or web address]        "

## 2024-11-25 NOTE — Progress Notes (Signed)
 Brief IR note  Minimal output from left renal abscess drain - discussed with IR attending today. Will obtain CT abd/pelvis without contrast today to assess for possible drain removal prior to potential d/c tomorrow.  IR will follow up once CT is completed to discuss next steps.  Please call/secure chat with questions.  Clotilda Hesselbach, PA-C

## 2024-11-25 NOTE — Discharge Summary (Incomplete)
 "  PATIENT DETAILS Name: Jerry [name]. Age: 84 y.o. Sex: male Date of Birth: 05-Nov-1941 MRN: [id number]. Admitting Physician: Alm Apo, MD ERE:Dyzounw, Suzen, MD  Admit Date: 11/15/2024 Discharge date: 11/26/2024  Recommendations for Outpatient Follow-up:  Follow up with PCP in 1-2 weeks Please obtain CMP/CBC in one week Ensure outpatient follow-up with urology Please ensure follow-up with interventional radiology  Admitted From:  Home  Disposition: Skilled nursing facility   Discharge Condition: good  CODE STATUS:   Code Status: Full Code   Diet recommendation:  Diet Order             Diet Carb Modified           Diet - low sodium heart healthy           Diet regular Room service appropriate? 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  Significant studies 1/3>> CT chest/abdomen/pelvis: B/L lower lobe bronchiectasis with patchy airspace disease, complex hypodensity upper pole of left kidney measuring 7.3 cm (increased from 5.5 cm on prior exam). 1/5>> VQ scan: No PE 1/5>> renal ultrasound: No acute findings 1/6>> chest x-ray: Consolidation-LLE. 1/8>> CT abdomen/pelvis: Enlarging 8.4 x 7.2 complex left upper pole lesion-renal abscess or a complex/hemorrhagic cyst 1/11>> B/L LE Doppler: No DVT 1/13>> CT abdomen/pelvis: Marked reduction in volume of the subcapsular left renal collection following drainage placement.  Microbiology data 1/3>> COVID/influenza/RSV PCR: Negative 1/3>> respiratory virus panel: Negative 1/3>> blood culture: No  growth 1/6>> respiratory virus panel: Negative 1/9>> left renal cyst/abscess culture: Rare Klebsiella pneumoniae  Procedures performed 1/9>> ultrasound-guided left renal abscess drain placement.  Consultants Urology IR   Brief Hospital Course: Sepsis secondary to PNA-infected left renal cyst Clinically improved Previously on broad-spectrum antibiotics-Flagyl /Zyvox /meropenem  Culture data negative S/p ultrasound-guided left renal cyst drain placement-cultures positive for Klebsiella PNA Discussed with pharmacy team-Will be switched to Augmentin  on discharge-for 5 more days. If evaluated by IR today-recommendations are to keep drain in place for another week or so-IR will arrange for follow-up in drain removal accordingly.  Bronchiectasis flare Managed with supportive care-antibiotics/bronchodilators Incentive spirometry/flutter valve.  AKI on CKD stage IIIb AKI hemodynamically mediated in the setting of sepsis Improved with IV fluids and supportive care  HTN BP currently stable Continue amlodipine   Mood disorder Continue Lexapro  As needed Xanax   Dementia Delirium precautions Namenda   History of malignant neoplasm of the bladder-s/p cystoprostatectomy with ileal conduit Follows with [facility]  Discharge Diagnoses:  Principal Problem:   Severe sepsis (HCC) Active Problems:   History of pulmonary embolism   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)   Hyperkalemia   History of malignant neoplasm of bladder   Essential hypertension   Vascular dementia (HCC)   Non-insulin  dependent type 2 diabetes mellitus (HCC)   Discharge Instructions:  Activity:  As tolerated    Discharge Instructions     Call MD for:  difficulty breathing, headache or visual disturbances   Complete by: As directed    Call MD for:  redness, tenderness, or signs of infection (pain, swelling, redness, odor or  green/yellow discharge around incision  site)   Complete by: As directed    Diet - low sodium heart healthy   Complete by: As directed    Diet Carb Modified   Complete by: As directed    Discharge instructions   Complete by: As directed    Follow with Primary MD  Theo Iha, MD in 1-2 weeks  Please get a complete blood count and chemistry panel checked by your Primary MD at your next visit, and again as instructed by your Primary MD.  Get Medicines reviewed and adjusted: Please take all your medications with you for your next visit with your Primary MD  Laboratory/radiological data: Please request your Primary MD to go over all hospital tests and procedure/radiological results at the follow up, please ask your Primary MD to get all Hospital records sent to his/her office.  In some cases, they will be blood work, cultures and biopsy results pending at the time of your discharge. Please request that your primary care M.D. follows up on these results.  Also Note the following: If you experience worsening of your admission symptoms, develop shortness of breath, life threatening emergency, suicidal or homicidal thoughts you must seek medical attention immediately by calling 911 or calling your MD immediately  if symptoms less severe.  You must read complete instructions/literature along with all the possible adverse reactions/side effects for all the Medicines you take and that have been prescribed to you. Take any new Medicines after you have completely understood and accpet all the possible adverse reactions/side effects.   Do not drive when taking Pain medications or sleeping medications (Benzodaizepines)  Do not take more than prescribed Pain, Sleep and Anxiety Medications. It is not advisable to combine anxiety,sleep and pain medications without talking with your primary care practitioner  Special Instructions: If you have smoked or chewed Tobacco  in the last 2 yrs please stop smoking, stop any regular Alcohol  and or  any Recreational drug use.  Wear Seat belts while driving.  Please note: You were cared for by a hospitalist during your hospital stay. Once you are discharged, your primary care physician will handle any further medical issues. Please note that NO REFILLS for any discharge medications will be authorized once you are discharged, as it is imperative that you return to your primary care physician (or establish a relationship with a primary care physician if you do not have one) for your post hospital discharge needs so that they can reassess your need for medications and monitor your lab values.   Increase activity slowly   Complete by: As directed       Allergies as of 11/26/2024   No Known Allergies      Medication List     TAKE these medications    albuterol  108 (90 Base) MCG/ACT inhaler Commonly known as: VENTOLIN  HFA Inhale 2 puffs into the lungs every 6 (six) hours as needed for wheezing or shortness of breath.   ALPRAZolam  0.5 MG tablet Commonly known as: XANAX  Take 1 tablet (0.5 mg total) by mouth 2 (two) times daily as needed for anxiety.   amLODipine  10 MG tablet Commonly known as: NORVASC  Take 1 tablet (10 mg total) by mouth daily. What changed: how much to take   amoxicillin -clavulanate 500-125 MG tablet Commonly known as: Augmentin  Take 1 tablet by mouth 2 (two) times daily for 5 days.   atorvastatin  20 MG tablet Commonly known as: LIPITOR Take 1 tablet (20 mg total) by mouth daily.  Chlorphen-PE-Acetaminophen  4-10-325 MG Tabs Take 1 tablet by mouth daily as needed (For sinus).   Cholecalciferol  50 MCG (2000 UT) Tabs Take 2,000 Units by mouth daily.   Dulera  200-5 MCG/ACT Aero Generic drug: mometasone -formoterol  Inhale 2 puffs into the lungs 2 (two) times daily.   escitalopram  10 MG tablet Commonly known as: LEXAPRO  Take 10 mg by mouth daily.   Folivane-Plus Caps Take 1 capsule by mouth daily.   furosemide  40 MG tablet Commonly known as:  LASIX  Take 40 mg daily only if needed for swelling or weight gain.   ipratropium-albuterol  0.5-2.5 (3) MG/3ML Soln Commonly known as: DUONEB Inhale 3 mLs into the lungs every 2 (two) hours as needed (SOB/Wheezing).   Magnesium  Oxide (Laxative) 500 MG Tabs Take 1 tablet by mouth daily.   memantine  10 MG tablet Commonly known as: NAMENDA  Take 10 mg by mouth 2 (two) times daily.   OLANZapine  2.5 MG tablet Commonly known as: ZyPREXA  Take 1 tablet (2.5 mg total) by mouth 2 (two) times daily as needed (agitation).   pantoprazole  40 MG tablet Commonly known as: Protonix  Take 1 tablet (40 mg total) by mouth 2 (two) times daily. What changed:  when to take this reasons to take this   sodium chloride  flush 0.9 % Soln Commonly known as: NS 10 mLs by Intracatheter route daily. Please dispense 1 box of 10 mL pre-filled syringes (#60).   ULTRAFLORA IMMUNE HEALTH PO Take 1 tablet by mouth daily.   VITAMIN B12 PO Take 1 tablet by mouth daily.        Contact information for follow-up providers     Theo Iha, MD. Schedule an appointment as soon as possible for a visit in 1 week(s).   Specialty: Internal Medicine Contact information: [address] [place] [address] [place] [address] [phone]         [facility] INTERVENTIONAL RADIOLOGY Follow up.   Specialty: Radiology Why: Schedulers will contact you with date and time of follow-up appointment. Contact information: [address] [place]  [phone]        [facility] .               Contact information for after-discharge care     Destination     [facility] .   Service: Skilled Nursing Contact information: [address] [place]  [address] [phone]                    Allergies[1]   Other Procedures/Studies: CT ABDOMEN PELVIS WO CONTRAST Result Date: 11/25/2024 EXAM: CT ABDOMEN  AND PELVIS WITHOUT CONTRAST 11/25/2024 02:23:00 PM TECHNIQUE: CT of the abdomen and pelvis was performed without the administration of intravenous contrast. Multiplanar reformatted images are provided for review. Automated exposure control, iterative reconstruction, and/or weight-based adjustment of the mA/kV was utilized to reduce the radiation dose to as low as reasonably achievable. COMPARISON: Comparison exam 11/20/2024. CLINICAL HISTORY: Follow up left renal abscess drain for possible removal due to low output. FINDINGS: LOWER CHEST: Left basilar atelectasis. LIVER: The liver is unremarkable. GALLBLADDER AND BILE DUCTS: Gallbladder is unremarkable. No biliary ductal dilatation. SPLEEN: No acute abnormality. PANCREAS: No acute abnormality. ADRENAL GLANDS: No acute abnormality. KIDNEYS, URETERS AND BLADDER: Interval placement of a pigtail transcatheter into the left renal subcapsular collection. There is marked reduction in volume of the subcapsular left renal collection. The collection previously measured 8.4 x 7.2 cm and now measures approximately 4.0 x 3.0 cm.  Large simple fluid attenuation cyst extends from the left renal cortex measuring 4.8 cm. Similar benign cyst in the upper pole of the right kidney. Per consensus, no follow-up is needed for simple Bosniak type 1 and 2 renal cysts, unless the patient has a malignancy history or risk factors. No hydronephrosis or obstructive uropathy. No ureteral calculus. No hydroureter. No cystectomy. Ileal conduit anatomy without complication. No perinephric or periureteral stranding. GI AND BOWEL: Stomach demonstrates no acute abnormality. There is no bowel obstruction. PERITONEUM AND RETROPERITONEUM: No ascites. No free air. VASCULATURE: Aorta is normal in caliber. LYMPH NODES: No lymphadenopathy. REPRODUCTIVE ORGANS: No acute abnormality. BONES AND SOFT TISSUES: Posterior lumbar fusion and decompression. No acute osseous abnormality. No focal soft tissue abnormality.  IMPRESSION: 1. Marked reduction in volume of the subcapsular left renal collection following percutaneous drainage catheter placement 2. No hydronephrosis or obstructive uropathy. Electronically signed by: Norleen Boxer MD 11/25/2024 04:09 PM EST RP Workstation: [id number]   VAS US  LOWER EXTREMITY VENOUS (DVT) Result Date: 11/24/2024  Lower Venous DVT Study Patient Name:  Jerry [name].  Date of Exam:   11/23/2024 Medical Rec #: [id number]          Accession #:    [id number] Date of Birth: [birth year]           Patient Gender: M Patient Age:   69 years Exam Location:  [facility] Procedure:      VAS US  LOWER EXTREMITY VENOUS (DVT) Referring Phys: DAWOOD ELGERGAWY --------------------------------------------------------------------------------  Indications: Persistent fever.  Risk Factors: Cancer; Bladder 2019. DVT 02/22/24 left popliteal. IVC filter placed 4/112025 by [facility]. PE 02/15/2024 Comparison Study: Prior negative bilateral LEV done 03/05/24 (resolution of left                   popliteal DVT noted on study 02/22/24) Performing Technologist: Alberta Lis RVS  Examination Guidelines: A complete evaluation includes B-mode imaging, spectral Doppler, color Doppler, and power Doppler as needed of all accessible portions of each vessel. Bilateral testing is considered an integral part of a complete examination. Limited examinations for reoccurring indications may be performed as noted. The reflux portion of the exam is performed with the patient in reverse Trendelenburg.  +---------+---------------+---------+-----------+----------+---------------+ RIGHT    CompressibilityPhasicitySpontaneityPropertiesThrombus Aging  +---------+---------------+---------+-----------+----------+---------------+ CFV      Full           Yes      Yes                                  +---------+---------------+---------+-----------+----------+---------------+ SFJ      Full                                                          +---------+---------------+---------+-----------+----------+---------------+ FV Prox  Full           Yes      Yes                                  +---------+---------------+---------+-----------+----------+---------------+ FV Mid   Full                                                         +---------+---------------+---------+-----------+----------+---------------+  FV DistalFull                                                         +---------+---------------+---------+-----------+----------+---------------+ PFV      Full           Yes      Yes                                  +---------+---------------+---------+-----------+----------+---------------+ POP      Full           Yes      Yes                                  +---------+---------------+---------+-----------+----------+---------------+ PTV      Full                                                         +---------+---------------+---------+-----------+----------+---------------+ PERO     Full                                                         +---------+---------------+---------+-----------+----------+---------------+ Gastroc  Full                                                         +---------+---------------+---------+-----------+----------+---------------+ SSV      Full                                         Thickened walls +---------+---------------+---------+-----------+----------+---------------+   +---------+---------------+---------+-----------+----------+--------------+ LEFT     CompressibilityPhasicitySpontaneityPropertiesThrombus Aging +---------+---------------+---------+-----------+----------+--------------+ CFV      Full           Yes      Yes                                 +---------+---------------+---------+-----------+----------+--------------+ SFJ      Full                                                         +---------+---------------+---------+-----------+----------+--------------+ FV Prox  Full           Yes      Yes                                 +---------+---------------+---------+-----------+----------+--------------+ FV Mid  Full           Yes      Yes                                 +---------+---------------+---------+-----------+----------+--------------+ FV DistalFull           Yes      Yes                                 +---------+---------------+---------+-----------+----------+--------------+ PFV      Full           Yes      Yes                                 +---------+---------------+---------+-----------+----------+--------------+ POP      Full           Yes      Yes                                 +---------+---------------+---------+-----------+----------+--------------+ PTV      Full                                                        +---------+---------------+---------+-----------+----------+--------------+ PERO     Full                                                        +---------+---------------+---------+-----------+----------+--------------+ SSV      Full                                                        +---------+---------------+---------+-----------+----------+--------------+     Summary: RIGHT: - There is no evidence of deep vein thrombosis in the lower extremity.  - No cystic structure found in the popliteal fossa.  LEFT: - There is no evidence of deep vein thrombosis in the lower extremity.  - No cystic structure found in the popliteal fossa.  *See table(s) above for measurements and observations. Electronically signed by Gaile New MD on 11/24/2024 at 8:34:40 AM.    Final    IR US  ABSCESS DRAIN PLACEMENT Result Date: 11/21/2024 INDICATION: 84 year old male with history of sepsis and suspected etiology of enlarging left renal fluid collection. EXAM: Ultrasound-guided left renal abscess drain placement MEDICATIONS:  The patient is currently admitted to the hospital and receiving intravenous antibiotics. The antibiotics were administered within an appropriate time frame prior to the initiation of the procedure. ANESTHESIA/SEDATION: Fentanyl  25 mcg IV; Versed  0.5 mg IV Moderate Sedation Time:  13 The patient was continuously monitored during the procedure by the interventional radiology nurse under my direct supervision. COMPLICATIONS: None immediate. PROCEDURE: Informed written consent was obtained from the patient after a thorough discussion of the procedural risks, benefits and alternatives.  All questions were addressed. Maximal Sterile Barrier Technique was utilized including caps, [name], sterile gowns, sterile gloves, sterile drape, hand hygiene and skin antiseptic. A timeout was performed prior to the initiation of the procedure. Preprocedure ultrasound evaluation demonstrated a mildly complex ovoid fluid collection about the left kidney. The procedure was planned. Subdermal Local anesthesia was administered at the planned needle entry site. Under direct ultrasound visualization, local anesthetic was administered to the periphery of the lesion. A small skin nick was made. Under direct ultrasound visualization, an 18 gauge trocar needle was directed into the central aspect of the collection. Aspiration was performed which yielded purulent fluid. Given suspected infection in size of the collection, the decision was made to place a drain. Therefore, an Amplatz wire was inserted through the needle and coiled in the collection. After serial dilation was performed, an 8 French skater drain was then placed with the pigtail portion coiled in the central aspect of the collection. Approximately 300 mL of purulent fluid was aspirated. A sample was sent to the lab for culture. The drain was placed to bulb suction. The drain was affixed to the skin with a 0 silk interrupted suture. A sterile bandage was applied. The patient tolerated the  procedure well was transferred back to the floor in good condition. IMPRESSION: Technically successful ultrasound-guided left renal abscess 8 French drain placement. Ester Sides, MD [facility] Radiology Electronically Signed   By: Ester Sides M.D.   On: 11/21/2024 15:21   CT ABDOMEN PELVIS WO CONTRAST Result Date: 11/20/2024 EXAM: CT ABDOMEN AND PELVIS WITHOUT CONTRAST 11/20/2024 02:16:11 PM TECHNIQUE: CT of the abdomen and pelvis was performed without the administration of intravenous contrast. Multiplanar reformatted images are provided for review. Automated exposure control, iterative reconstruction, and/or weight-based adjustment of the mA/kV was utilized to reduce the radiation dose to as low as reasonably achievable. COMPARISON: 11/15/2024 CLINICAL HISTORY: Fever, left renal cyst versus abscess. Renal insufficiency. FINDINGS: LOWER CHEST: Mild cardiomegaly. Small pericardial effusion. Trace left pleural effusion with mild atelectasis posteriorly in the left lower lobe and in the posterobasal segment right lower lobe. 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 LIVER: Stable 4 mm fluid density lesion inferiorly in the right hepatic lobe on image 26 series 4. Faint calcifications along the margin of this lesion which is partially exophytic. Other tiny hypodense lesions are scattered in the liver and similar to prior. GALLBLADDER AND BILE DUCTS: Cholecystectomy. No biliary ductal dilatation. SPLEEN: No acute abnormality. PANCREAS: No acute abnormality. ADRENAL GLANDS: No acute abnormality. KIDNEYS, URETERS AND BLADDER: Hypodense lesions are present in both kidneys compatible with benign cysts. There is also a complex 8.4 x 7.2 cm lesion of the left kidney upper pole, internal density 24 HU, previously measuring 7.5 x 6.4 cm. This could be a complex cyst, an abscess, or a mass. There is some faint left perirenal stranding  although not increased from 11/15/2024. Right lower quadrant ileostomy. Right abdominal ileal conduit noted. The urinary bladder and prostate gland appear to be absent. No stones in the kidneys or ureters. No hydronephrosis. No perinephric or periureteral stranding. GI AND BOWEL: Stomach demonstrates no acute abnormality. Prior administered contrast medium extends to the rectum. A supraumbilical hernia contains adipose tissue and a margin of the transverse colon as shown on image 83 series 8. There is no bowel obstruction. PERITONEUM AND RETROPERITONEUM: No ascites. No free air. VASCULATURE: Aorta is normal in caliber. Infrarenal IVC filter noted. Systemic atherosclerosis is present,  including the aorta and iliac arteries. LYMPH NODES: Small retroperitoneal lymph nodes are not pathologically enlarged but may be reactive. REPRODUCTIVE ORGANS: Prostate gland appears to be absent. BONES AND SOFT TISSUES: Posterolateral rod and pedicle screw fixation at L3-L4, L4-L5, and L5-S1 with associated posterior decompression spanning from L2-L3 through L5. Interbody space artifact at diffused levels. A supraumbilical hernia contains adipose tissue and a margin of the transverse colon as shown on image 83 series 8. No acute osseous abnormality. No focal soft tissue abnormality. IMPRESSION: 1. Enlarging 8.4 x 7.2 cm complex left upper pole renal lesion measuring 24 HU with stable mild left perirenal stranding, which could represent a renal abscess or a complex/hemorrhagic cyst. Given the enlargement over the last 5 days, malignancy seems less likely. With clinical signs of urinary tract infection such as pyuria. 2. Small retroperitoneal lymph nodes which are not pathologically enlarged and may be reactive. 3. Additional chronic and incidental findings include bilateral renal cysts, mild cardiomegaly with small pericardial effusion, trace left pleural effusion with mild bibasilar atelectasis and lower lobe bronchiectasis with  airway plugging, stable hepatic lesions, cholecystectomy, right lower quadrant ileostomy and right abdominal ileal conduit with absent bladder and prostate, supraumbilical hernia containing fat and a portion of transverse colon, infrarenal IVC filter, systemic atherosclerosis, and postsurgical lumbar fusion and decompression changes as described in the report body. Electronically signed by: Ryan Salvage MD MD 11/20/2024 02:51 PM EST RP Workstation: [id number]   DG Swallowing Func-Speech Pathology Result Date: 11/19/2024 Table formatting from the original result was not included. Modified Barium Swallow Study Patient Details Name: Jerry [name]. MRN: [id number] Date of Birth: [birth year] Today's Date: 11/19/2024 HPI/PMH: HPI: Mr. Bob is an 84 yo presenting with weakness and lethargy. Admitted with severe sepsis likely from PNA with CT Chest showing L > R infiltrates and bronchiectasis. Previous swallow eval in March 2025 with normal appearing swallow. PMH includes: parkinsonism, dementia, prostate ca, CKDIII, HTN, HLD Clinical Impression: Pt presents with overall functional oropharyngeal swallowing. There is trace oropharyngeal residue that is managed with a subswallow. Laryngeal closure is complete with no penetration/aspiration occurring across all trials. He took the 13 mm barium tablet with thin liquids, which progressed through the pharynx and esophagus quickly, though he does endorse coughing when eating vegetables recently that may be representative of an esophageal component (could consider dedicated assessment). Continue current diet without ongoing SLP f/u. Factors that may increase risk of adverse event in presence of aspiration Noe & Lianne 2021): Factors that may increase risk of adverse event in presence of aspiration Noe & Lianne 2021): Reduced cognitive function; Limited mobility Recommendations/Plan: Swallowing Evaluation Recommendations Swallowing Evaluation Recommendations  Recommendations: PO diet PO Diet Recommendation: Regular; Thin liquids (Level 0) Liquid Administration via: Cup; Straw Medication Administration: Whole meds with liquid Supervision: Staff to assist with self-feeding; Intermittent supervision/cueing for swallowing strategies Swallowing strategies  : Minimize environmental distractions; Slow rate; Small bites/sips Postural changes: Position pt fully upright for meals Oral care recommendations: Oral care BID (2x/day) Treatment Plan Treatment Plan Treatment recommendations: No treatment recommended at this time Follow-up recommendations: No SLP follow up Functional status assessment: Patient has had a recent decline in their functional status and demonstrates the ability to make significant improvements in function in a reasonable and predictable amount of time. Recommendations Recommendations for follow up therapy are one component of a multi-disciplinary discharge planning process, led by the attending physician.  Recommendations may be updated based on patient status, additional functional criteria and insurance authorization. Assessment:  Orofacial Exam: Orofacial Exam Oral Cavity: Oral Hygiene: WFL Oral Cavity - Dentition: Adequate natural dentition Orofacial Anatomy: WFL Oral Motor/Sensory Function: WFL Anatomy: Anatomy: Suspected cervical osteophytes Boluses Administered: Boluses Administered Boluses Administered: Thin liquids (Level 0); Mildly thick liquids (Level 2, nectar thick); Moderately thick liquids (Level 3, honey thick); Puree; Solid  Oral Impairment Domain: Oral Impairment Domain Lip Closure: No labial escape Tongue control during bolus hold: Cohesive bolus between tongue to palatal seal Bolus preparation/mastication: Timely and efficient chewing and mashing Bolus transport/lingual motion: Brisk tongue motion Oral residue: Complete oral clearance Location of oral residue : N/A Initiation of pharyngeal swallow : Pyriform sinuses  Pharyngeal Impairment  Domain: Pharyngeal Impairment Domain Soft palate elevation: No bolus between soft palate (SP)/pharyngeal wall (PW) Laryngeal elevation: Complete superior movement of thyroid  cartilage with complete approximation of arytenoids to epiglottic petiole Anterior hyoid excursion: Complete anterior movement Epiglottic movement: Complete inversion Laryngeal vestibule closure: Complete, no air/contrast in laryngeal vestibule Pharyngeal stripping wave : Present - complete Pharyngeal contraction (A/P view only): N/A Pharyngoesophageal segment opening: Complete distension and complete duration, no obstruction of flow Tongue base retraction: No contrast between tongue base and posterior pharyngeal wall (PPW) Pharyngeal residue: Trace residue within or on pharyngeal structures Location of pharyngeal residue: Tongue base; Valleculae  Esophageal Impairment Domain: Esophageal Impairment Domain Esophageal clearance upright position: Complete clearance, esophageal coating Pill: Pill Consistency administered: Thin liquids (Level 0) Thin liquids (Level 0): [facility] Penetration/Aspiration Scale Score: Penetration/Aspiration Scale Score 1.  Material does not enter airway: Thin liquids (Level 0); Mildly thick liquids (Level 2, nectar thick); Moderately thick liquids (Level 3, honey thick); Puree; Solid; Pill Compensatory Strategies: Compensatory Strategies Compensatory strategies: No   General Information: Caregiver present: No  Diet Prior to this Study: Regular; Thin liquids (Level 0)   Temperature : Normal   Respiratory Status: WFL   Supplemental O2: None (Room air)   History of Recent Intubation: No  Behavior/Cognition: Alert; Pleasant mood; Cooperative Self-Feeding Abilities: Able to self-feed Baseline vocal quality/speech: Normal Volitional Cough: Able to elicit Volitional Swallow: Able to elicit Exam Limitations: No limitations Goal Planning: Prognosis for improved oropharyngeal function: Good Barriers to Reach Goals: Cognitive deficits No  data recorded Patient/Family Stated Goal: none stated Consulted and agree with results and recommendations: Patient; Physician Pain: Pain Assessment Pain Assessment: No/denies pain Faces Pain Scale: 4 Pain Location: R leg Pain Descriptors / Indicators: Sore Pain Intervention(s): Monitored during session End of Session: Start Time:SLP Start Time (ACUTE ONLY): 0955 Stop Time: SLP Stop Time (ACUTE ONLY): 1012 Time Calculation:SLP Time Calculation (min) (ACUTE ONLY): 17 min Charges: SLP Evaluations $ SLP Speech Visit: 1 Visit SLP Evaluations $MBS Swallow: 1 Procedure $Swallowing Treatment: 1 Procedure SLP visit diagnosis: SLP Visit Diagnosis: Dysphagia, oropharyngeal phase (R13.12) Past Medical History: Past Medical History: Diagnosis Date  Allergic rhinitis 07/21/2021  Anemia 04/17/2015  Arthritis   Asthma   Asthma, mild intermittent   Benign essential hypertension 07/21/2021  Bladder cancer (HCC) 2019  CAP (community acquired pneumonia) 06/26/2023  Degenerative cervical spinal stenosis 07/21/2021  Faintness   History of malignant neoplasm of bladder 07/21/2021  Hyperglycemia 04/17/2015  Hypertension   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  Kidney stone   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  Malnutrition of moderate degree 06/28/2023  Mixed hyperlipidemia 07/21/2021  OSA (obstructive sleep apnea) 07/12/2021  NPSG- 03/16/21- AHI 10.3/ hr, desaturation to 87%, body weight 190 lbs    Renal insufficiency 04/17/2015  Syncope 04/17/2015  Tremor 07/21/2021  Vascular dementia (HCC)   Vitamin D  deficiency  07/21/2021 Past Surgical History: Past Surgical History: Procedure Laterality Date  BACK SURGERY    BIOPSY  10/09/2023  Procedure: BIOPSY;  Surgeon: Elicia Claw, MD;  Location: [facility];  Service: Gastroenterology;;  CHOLECYSTECTOMY    COLONOSCOPY WITH PROPOFOL  Bilateral 10/09/2023  Procedure: COLONOSCOPY WITH PROPOFOL ;  Surgeon: Elicia Claw, MD;  Location: [facility];  Service: Gastroenterology;  Laterality:  Bilateral;  ESOPHAGOGASTRODUODENOSCOPY N/A 02/22/2024  Procedure: EGD (ESOPHAGOGASTRODUODENOSCOPY);  Surgeon: Elicia Claw, MD;  Location: [facility] ENDOSCOPY;  Service: Gastroenterology;  Laterality: N/A;  ESOPHAGOGASTRODUODENOSCOPY (EGD) WITH PROPOFOL  Bilateral 10/09/2023  Procedure: ESOPHAGOGASTRODUODENOSCOPY (EGD) WITH PROPOFOL ;  Surgeon: Elicia Claw, MD;  Location: [facility];  Service: Gastroenterology;  Laterality: Bilateral;  HOT HEMOSTASIS N/A 10/09/2023  Procedure: HOT HEMOSTASIS (ARGON PLASMA COAGULATION/BICAP);  Surgeon: Elicia Claw, MD;  Location: [facility];  Service: Gastroenterology;  Laterality: N/A;  KNEE SURGERY Left   LOWER EXTREMITY VENOGRAPHY  01/29/2024  Procedure: LOWER EXTREMITY VENOGRAPHY;  Surgeon: Jordan, Peter M, MD;  Location: [facility] INVASIVE CV LAB;  Service: Cardiovascular;;  RIGHT/LEFT HEART CATH AND CORONARY ANGIOGRAPHY N/A 01/29/2024  Procedure: RIGHT/LEFT HEART CATH AND CORONARY ANGIOGRAPHY;  Surgeon: Jordan, Peter M, MD;  Location: [facility] INVASIVE CV LAB;  Service: Cardiovascular;  Laterality: N/A;  SHOULDER SURGERY    for rotator cuff tear  VENA CAVA UMBRELLA NECK APPROACH Right 02/22/2024  Procedure: INSERTION, UMBRELLA FILTER, INFERIOR VENA CAVA;  Surgeon: Sheree Penne Bruckner, MD;  Location: [facility] OR;  Service: Vascular;  Laterality: Right; Damien Blumenthal, M.A., CCC-SLP Speech Language Pathology, Acute Rehabilitation Services Secure Chat preferred [phone] 11/19/2024, 11:39 AM  DG CHEST PORT 1 VIEW Result Date: 11/18/2024 EXAM: 1 VIEW(S) XRAY OF THE CHEST 11/18/2024 09:30:00 PM COMPARISON: Portable chest today at 7:04 am. CLINICAL HISTORY: SOB (shortness of breath) FINDINGS: LUNGS AND PLEURA: Consolidation continues to be seen in the left lower lung field. There is a small left pleural effusion, presumably parapneumonic, and a minimal right pleural effusion. Overall aeration seems unchanged. No pneumothorax. HEART AND MEDIASTINUM: Mild cardiomegaly is seen with mild  central vascular prominence without visible edema. The mediastinum is normally outlined. BONES AND SOFT TISSUES: Degenerative change and mild dextroscoliosis of the thoracic spine. IMPRESSION: 1. Consolidation in the left lower lung field with a small left pleural effusion, stable overall aeration. 2. Minimal right pleural effusion. 3. Mild cardiomegaly with mild central vascular prominence without visible edema. Electronically signed by: Francis Quam MD 11/18/2024 09:38 PM EST RP Workstation: [id number]   DG Chest Port 1 View Result Date: 11/18/2024 CLINICAL DATA:  Shortness of breath. EXAM: PORTABLE CHEST 1 VIEW COMPARISON:  11/17/2024 FINDINGS: Patchy airspace disease noted in the left lung base, similar to prior. Small left pleural effusion. Right lung remains clear. Cardiopericardial silhouette is at upper limits of normal for size. No acute bony abnormality. IMPRESSION: Patchy airspace disease at the left base with small left pleural effusion, similar to prior. Electronically Signed   By: Camellia Candle M.D.   On: 11/18/2024 07:32   US  RENAL Result Date: 11/17/2024 EXAM: US  Retroperitoneum Complete, Renal. 11/17/2024 01:54:10 PM TECHNIQUE: Real-time ultrasonography of the retroperitoneum renal was performed. COMPARISON: US  Renal 11/16/2023. CT abdomen and pelvis 11/15/2024. CLINICAL HISTORY: AKI (acute kidney injury). FINDINGS: FINDINGS: RIGHT KIDNEY/URETER: Right kidney measures 11.3 x 5.2 x 4.9 cm. Normal cortical echogenicity. Cyst in the upper pole measuring 2.8 x 3.4 x 2.4 cm. No change since prior study. No imaging follow-up is indicated. No hydronephrosis. No calculus. No mass. LEFT KIDNEY/URETER: Left kidney measures 12.4 x 6.2 x 5.4 cm.  Normal cortical echogenicity. Cyst in the mid pole measuring 4.1 x 4.1 x 5.3 cm. No change since prior study. No imaging follow-up is indicated. No hydronephrosis. No calculus. No mass. BLADDER: Surgical absence of the bladder. IMPRESSION: 1. No acute findings.  Electronically signed by: Elsie Gravely MD 11/17/2024 09:32 PM EST RP Workstation: [id number]   NM Pulmonary Perfusion Result Date: 11/17/2024 EXAM: NM Lung Perfusion Scan. CLINICAL HISTORY: Pulmonary embolism (PE) suspected, high prob. TECHNIQUE: Radiolabeled MAA was administered intravenously and planar images of the lungs were obtained in multiple projections. RADIOPHARMACEUTICAL: 4.37 millicurie Technetium Albumin  Aggregated (MAA) injection solution 4.4 millicurie Technetium-64m Albumin  Aggregated. COMPARISON: Chest radiograph 11/17/2024 and CT chest 11/15/2024. FINDINGS: PERFUSION: There is decreased perfusion to the left lower lobe. However, there is severe bronchiectasis and atelectasis within the left lower lobe on comparison CT. No unmatched wedge shaped peripheral perfusion defect to suggest acute pulmonary embolism. IMPRESSION: 1. No evidence of acute pulmonary embolism. 2. Bronchiectasis and atelectasis in the left lower lobe. Electronically signed by: Norleen Boxer MD 11/17/2024 10:41 AM EST RP Workstation: [id number]   DG Chest Port 1 View Result Date: 11/17/2024 EXAM: 1 VIEW(S) XRAY OF THE CHEST 11/17/2024 06:28:00 AM COMPARISON: Chest CT without contrast 11/15/2024. CLINICAL HISTORY: SOB (shortness of breath). FINDINGS: LUNGS AND PLEURA: Left pleural effusion is increased. Infrahilar bronchiectasis in the lower lobes was better seen with CT. The lungs are mildly emphysematous and again showing scattered airspace disease in the left lung base. No new or worsening infiltrate is evident. No pneumothorax. HEART AND MEDIASTINUM: No acute abnormality of the cardiac and mediastinal silhouettes. BONES AND SOFT TISSUES: Degenerative change and mild dextroscoliosis of the thoracic spine. IMPRESSION: 1. Increased small  left pleural effusion. 2. Mild emphysema with scattered left basilar airspace disease, without new or worsening infiltrate. 3. Infrahilar bilateral cylindrical lower lobe bronchiectasis,  better seen on CT. Electronically signed by: Francis Quam MD 11/17/2024 06:49 AM EST RP Workstation: [id number]   CT CHEST ABDOMEN PELVIS WO CONTRAST Result Date: 11/15/2024 CLINICAL DATA:  Clinical concern for sepsis. EXAM: CT CHEST, ABDOMEN AND PELVIS WITHOUT CONTRAST TECHNIQUE: Multidetector CT imaging of the chest, abdomen and pelvis was performed following the standard protocol without IV contrast. RADIATION DOSE REDUCTION: This exam was performed according to the departmental dose-optimization program which includes automated exposure control, adjustment of the mA and/or kV according to patient size and/or use of iterative reconstruction technique. COMPARISON:  01/27/2024. FINDINGS: CT CHEST FINDINGS Cardiovascular: The heart is normal in size and there is a trace pericardial effusion. Multi-vessel coronary artery calcifications are seen. There is atherosclerotic calcification of the aorta without evidence of aneurysm. The pulmonary trunk is normal in caliber. Mediastinum/Nodes: No mediastinal or axillary lymphadenopathy. Evaluation of the hila is limited due to lack of IV contrast. The thyroid  gland, trachea, and esophagus are within normal limits. Lungs/Pleura: Paraseptal and centrilobular emphysematous changes are present in the lungs. There is a stable cylindrical bronchiectasis is noted in the lower lobes bilaterally with patchy airspace disease at the lung bases, greater on the left than the right. There is a small left pleural effusion. 4 mm nodule in the right middle lobe, axial image 89. Musculoskeletal: Degenerative changes are present in the thoracic spine. No acute fracture is seen. CT ABDOMEN PELVIS FINDINGS Hepatobiliary: There is a 9 mm hypodensity in the left lobe of the liver, segment 2 with attenuation compatible with a cyst. Additional subcentimeter hypodensities are present in the liver which are too small to further characterize. The gallbladder is  surgically absent. There is a 4.2 cm  cyst in the right lobe of the liver, not significantly changed from the prior exam. No biliary ductal dilatation. Pancreas: Unremarkable. No pancreatic ductal dilatation or surrounding inflammatory changes. Spleen: Normal in size without focal abnormality. Adrenals/Urinary Tract: The adrenal glands are within normal limits. Hypodensities are noted in the kidneys bilaterally, the largest measuring up to 0 7.3 cm on the left, previously 5.5 cm. There is perinephric fat stranding bilaterally, greater on the left than on the right. No renal calculus or hydronephrosis is seen. 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 Stomach/Bowel: The stomach is within normal limits. No bowel obstruction, free air, or pneumatosis is seen. There is a hernia in the upper abdomen in the midline containing nonobstructed transverse colon. A moderate amount of retained stool is noted in the colon. Appendix appears normal. Vascular/Lymphatic: IVC filter is present. Aortic atherosclerosis. No abdominal or pelvic lymphadenopathy is seen. Reproductive: The prostate gland is not seen and may be surgically absent. Other: A small fat containing umbilical hernia is noted Musculoskeletal: Degenerative changes are present in the thoracolumbar spine. Lumbar spinal fusion hardware is noted from L3-S1. No acute osseous abnormality. IMPRESSION: 1. Bilateral lower lobe bronchiectasis with patchy airspace disease at the lung bases, possible atelectasis or infiltrate. 2. Small left pleural effusion. 3. Slightly complex hypodensity in the upper pole of the left kidney measuring 7.3 cm, increased from 5.5 cm on the previous exam. Perinephric fat stranding bilaterally, greater on the left than on the right with a small amount of free fluid in the perinephric space on the left. Differential diagnosis includes ruptured cyst versus infectious or inflammatory process. 4. Emphysema. 5. Moderate amount of retained stool in the colon. 6. Aortic  atherosclerosis and coronary artery calcifications. 7. Remaining incidental findings as described above. Electronically Signed   By: Leita Birmingham M.D.   On: 11/15/2024 14:51   DG Chest Port 1 View Result Date: 11/15/2024 EXAM: 1 VIEW(S) XRAY OF THE CHEST 11/15/2024 12:29:00 PM COMPARISON: 03/01/2024 CLINICAL HISTORY: Questionable sepsis - evaluate for abnormality FINDINGS: LUNGS AND PLEURA: Left base atelectasis. Trace left pleural effusion and/or pleural thickening, unchanged. No pneumothorax. HEART AND MEDIASTINUM: No acute abnormality of the cardiac and mediastinal silhouettes. BONES AND SOFT TISSUES: No acute osseous abnormality. IMPRESSION: 1. Trace left pleural effusion, unchanged. Electronically signed by: Morgane Naveau MD 11/15/2024 12:49 PM EST RP Workstation: [id number]     TODAY-DAY OF DISCHARGE:  Subjective:   Jerry [name] today has no headache,no chest abdominal pain,no new weakness tingling or numbness, feels much better wants to go home today.   Objective:   Blood pressure (!) 149/82, pulse 90, temperature 98.6 F (37 C), temperature source Oral, resp. rate (!) 21, height 5' 11 (1.803 m), weight 83.9 kg, SpO2 98%.  Intake/Output Summary (Last 24 hours) at 11/26/2024 1052 Last data filed at 11/26/2024 0700 Gross per 24 hour  Intake --  Output 2220 ml  Net -2220 ml   Filed Weights   11/15/24 1200  Weight: 83.9 kg    Exam: Awake Alert, Oriented *3, No new F.N deficits, Normal affect [place].AT,PERRAL Supple Neck,No JVD, No cervical lymphadenopathy appriciated.  Symmetrical Chest wall movement, Good air movement bilaterally, CTAB RRR,No Gallops,Rubs or new Murmurs, No Parasternal Heave +ve B.Sounds, Abd Soft, Non tender, No organomegaly appriciated, No rebound -guarding or rigidity. No Cyanosis, Clubbing or edema, No new Rash or bruise   PERTINENT RADIOLOGIC STUDIES: CT ABDOMEN PELVIS WO CONTRAST Result Date: 11/25/2024  EXAM: CT ABDOMEN AND PELVIS WITHOUT CONTRAST  11/25/2024 02:23:00 PM TECHNIQUE: CT of the abdomen and pelvis was performed without the administration of intravenous contrast. Multiplanar reformatted images are provided for review. Automated exposure control, iterative reconstruction, and/or weight-based adjustment of the mA/kV was utilized to reduce the radiation dose to as low as reasonably achievable. COMPARISON: Comparison exam 11/20/2024. CLINICAL HISTORY: Follow up left renal abscess drain for possible removal due to low output. FINDINGS: LOWER CHEST: Left basilar atelectasis. LIVER: The liver is unremarkable. GALLBLADDER AND BILE DUCTS: Gallbladder is unremarkable. No biliary ductal dilatation. SPLEEN: No acute abnormality. PANCREAS: No acute abnormality. ADRENAL GLANDS: No acute abnormality. KIDNEYS, URETERS AND BLADDER: Interval placement of a pigtail transcatheter into the left renal subcapsular collection. There is marked reduction in volume of the subcapsular left renal collection. The collection previously measured 8.4 x 7.2 cm and now measures approximately 4.0 x 3.0 cm. Large simple fluid attenuation cyst extends from the left renal cortex measuring 4.8 cm. Similar benign cyst in the upper pole of the right kidney. Per consensus, no follow-up is needed for simple Bosniak type 1 and 2 renal cysts, unless the patient has a malignancy history or risk factors. No hydronephrosis or obstructive uropathy. No ureteral calculus. No hydroureter. No cystectomy. Ileal conduit anatomy without complication. No perinephric or periureteral stranding. GI AND BOWEL: Stomach demonstrates no acute abnormality. There is no bowel obstruction. PERITONEUM AND RETROPERITONEUM: No ascites. No free air. VASCULATURE: Aorta is normal in caliber. LYMPH NODES: No lymphadenopathy. REPRODUCTIVE ORGANS: No acute abnormality. BONES AND SOFT TISSUES: Posterior lumbar fusion and decompression. No acute osseous abnormality. No focal soft tissue abnormality. IMPRESSION: 1. Marked  reduction in volume of the subcapsular left renal collection following percutaneous drainage catheter placement 2. No hydronephrosis or obstructive uropathy. Electronically signed by: Norleen Boxer MD 11/25/2024 04:09 PM EST RP Workstation: [id number]     PERTINENT LAB RESULTS: CBC: Recent Labs    11/25/24 0332 11/26/24 0344  WBC 10.8* 11.1*  HGB 8.5* 8.4*  HCT 27.6* 27.6*  PLT 358 390   CMET CMP     Component Value Date/Time   NA 141 11/26/2024 0344   NA 142 12/27/2023 1106   K 4.1 11/26/2024 0344   CL 109 11/26/2024 0344   CO2 23 11/26/2024 0344   GLUCOSE 95 11/26/2024 0344   BUN 40 (H) 11/26/2024 0344   BUN 31 (H) 12/27/2023 1106   CREATININE 1.64 (H) 11/26/2024 0344   CREATININE 1.95 (H) 09/18/2024 1203   CALCIUM  9.1 11/26/2024 0344   PROT 6.9 11/15/2024 1208   PROT 6.6 12/27/2023 1106   ALBUMIN  4.1 11/15/2024 1208   ALBUMIN  3.9 12/27/2023 1106   AST 17 11/15/2024 1208   AST 11 (L) 06/12/2024 1230   ALT 10 11/15/2024 1208   ALT 11 06/12/2024 1230   ALKPHOS 85 11/15/2024 1208   BILITOT 0.6 11/15/2024 1208   BILITOT 0.3 06/12/2024 1230   EGFR 34 (L) 12/27/2023 1106   GFRNONAA 42 (L) 11/26/2024 0344   GFRNONAA 34 (L) 09/18/2024 1203    GFR Estimated Creatinine Clearance: 37.6 mL/min (A) (by C-G formula based on SCr of 1.64 mg/dL (H)). No results for input(s): LIPASE, AMYLASE in the last 72 hours. No results for input(s): CKTOTAL, CKMB, CKMBINDEX, TROPONINI in the last 72 hours. Invalid input(s): POCBNP No results for input(s): DDIMER in the last 72 hours. No results for input(s): HGBA1C in the last 72 hours. No results for input(s): CHOL, HDL, LDLCALC, TRIG, CHOLHDL, LDLDIRECT in the last  72 hours. No results for input(s): TSH, T4TOTAL, T3FREE, THYROIDAB in the last 72 hours.  Invalid input(s): FREET3 No results for input(s): VITAMINB12, FOLATE, FERRITIN, TIBC, IRON , RETICCTPCT in the last 72  hours. Coags: No results for input(s): INR in the last 72 hours.  Invalid input(s): PT Microbiology: Recent Results (from the past 240 hours)  MRSA Next Gen by PCR, Nasal     Status: None   Collection Time: 11/16/24 12:40 PM   Specimen: Nasal Mucosa; Nasal Swab  Result Value Ref Range Status   MRSA by PCR Next Gen NOT DETECTED NOT DETECTED Final    Comment: (NOTE) The GeneXpert MRSA Assay (FDA approved for NASAL specimens only), is one component of a comprehensive MRSA colonization surveillance program. It is not intended to diagnose MRSA infection nor to guide or monitor treatment for MRSA infections. Test performance is not FDA approved in patients less than 24 years old. Performed at [facility], [address], [place], [place] [address]   Respiratory (~20 pathogens) panel by PCR     Status: None   Collection Time: 11/18/24  5:28 AM   Specimen: Nasopharyngeal Swab; Respiratory  Result Value Ref Range Status   Adenovirus NOT DETECTED NOT DETECTED Final   Coronavirus 229E NOT DETECTED NOT DETECTED Final    Comment: (NOTE) The Coronavirus on the Respiratory Panel, DOES NOT test for the novel  Coronavirus (2019 nCoV)    Coronavirus HKU1 NOT DETECTED NOT DETECTED Final   Coronavirus NL63 NOT DETECTED NOT DETECTED Final   Coronavirus OC43 NOT DETECTED NOT DETECTED Final   Metapneumovirus NOT DETECTED NOT DETECTED Final   Rhinovirus / Enterovirus NOT DETECTED NOT DETECTED Final   Influenza A NOT DETECTED NOT DETECTED Final   Influenza B NOT DETECTED NOT DETECTED Final   Parainfluenza Virus 1 NOT DETECTED NOT DETECTED Final   Parainfluenza Virus 2 NOT DETECTED NOT DETECTED Final   Parainfluenza Virus 3 NOT DETECTED NOT DETECTED Final   Parainfluenza Virus 4 NOT DETECTED NOT DETECTED Final   Respiratory Syncytial Virus NOT DETECTED NOT DETECTED Final   Bordetella pertussis NOT DETECTED NOT DETECTED Final   Bordetella Parapertussis NOT DETECTED NOT DETECTED Final    Chlamydophila pneumoniae NOT DETECTED NOT DETECTED Final   Mycoplasma pneumoniae NOT DETECTED NOT DETECTED Final    Comment: Performed at [facility], [address] [place], [place] [address]  Aerobic/Anaerobic Culture w Gram Stain (surgical/deep wound)     Status: None (Preliminary result)   Collection Time: 11/21/24  2:27 PM   Specimen: Abscess  Result Value Ref Range Status   Specimen Description ABSCESS LEFT RENAL  Final   Special Requests NONE  Final   Gram Stain   Final    ABUNDANT WBC PRESENT, PREDOMINANTLY PMN NO ORGANISMS SEEN Performed at [facility], [address], [place], [place] [address]    Culture   Final    RARE KLEBSIELLA PNEUMONIAE NO ANAEROBES ISOLATED; CULTURE IN PROGRESS FOR 5 DAYS    Report Status PENDING  Incomplete   Organism ID, Bacteria KLEBSIELLA PNEUMONIAE  Final      Susceptibility   Klebsiella pneumoniae - MIC*    AMPICILLIN  RESISTANT Resistant     CEFAZOLIN (NON-URINE) 2 SENSITIVE Sensitive     CEFEPIME  <=0.12 SENSITIVE Sensitive     ERTAPENEM <=0.12 SENSITIVE Sensitive     CEFTRIAXONE  <=0.25 SENSITIVE Sensitive     CIPROFLOXACIN <=0.06 SENSITIVE Sensitive     GENTAMICIN <=1 SENSITIVE Sensitive  MEROPENEM  <=0.25 SENSITIVE Sensitive     TRIMETH/SULFA <=20 SENSITIVE Sensitive     AMPICILLIN /SULBACTAM <=2 SENSITIVE Sensitive     PIP/TAZO Value in next row Sensitive      <=4 SENSITIVEThis is a modified FDA-approved test that has been validated and its performance characteristics determined by the reporting laboratory.  This laboratory is certified under the Clinical Laboratory Improvement Amendments CLIA as qualified to perform high complexity clinical laboratory testing.    * RARE KLEBSIELLA PNEUMONIAE    FURTHER DISCHARGE INSTRUCTIONS:  Get Medicines reviewed and adjusted: Please take all your medications with you for your next visit with your Primary MD  Laboratory/radiological data: Please request your Primary MD to go  over all hospital tests and procedure/radiological results at the follow up, please ask your Primary MD to get all Hospital records sent to his/her office.  In some cases, they will be blood work, cultures and biopsy results pending at the time of your discharge. Please request that your primary care M.D. goes through all the records of your hospital data and follows up on these results.  Also Note the following: If you experience worsening of your admission symptoms, develop shortness of breath, life threatening emergency, suicidal or homicidal thoughts you must seek medical attention immediately by calling 911 or calling your MD immediately  if symptoms less severe.  You must read complete instructions/literature along with all the possible adverse reactions/side effects for all the Medicines you take and that have been prescribed to you. Take any new Medicines after you have completely understood and accpet all the possible adverse reactions/side effects.   Do not drive when taking Pain medications or sleeping medications (Benzodaizepines)  Do not take more than prescribed Pain, Sleep and Anxiety Medications. It is not advisable to combine anxiety,sleep and pain medications without talking with your primary care practitioner  Special Instructions: If you have smoked or chewed Tobacco  in the last 2 yrs please stop smoking, stop any regular Alcohol  and or any Recreational drug use.  Wear Seat belts while driving.  Please note: You were cared for by a hospitalist during your hospital stay. Once you are discharged, your primary care physician will handle any further medical issues. Please note that NO REFILLS for any discharge medications will be authorized once you are discharged, as it is imperative that you return to your primary care physician (or establish a relationship with a primary care physician if you do not have one) for your post hospital discharge needs so that they can reassess your  need for medications and monitor your lab values.  Total Time spent coordinating discharge including counseling, education and face to face time equals greater than 30 minutes.  SignedBETHA Donalda Applebaum 11/26/2024 10:52 AM      [1] No Known Allergies  "

## 2024-11-26 ENCOUNTER — Other Ambulatory Visit (HOSPITAL_COMMUNITY): Payer: Self-pay

## 2024-11-26 DIAGNOSIS — I10 Essential (primary) hypertension: Secondary | ICD-10-CM

## 2024-11-26 LAB — BASIC METABOLIC PANEL WITH GFR
Anion gap: 10 (ref 5–15)
BUN: 40 mg/dL — ABNORMAL HIGH (ref 8–23)
CO2: 23 mmol/L (ref 22–32)
Calcium: 9.1 mg/dL (ref 8.9–10.3)
Chloride: 109 mmol/L (ref 98–111)
Creatinine, Ser: 1.64 mg/dL — ABNORMAL HIGH (ref 0.61–1.24)
GFR, Estimated: 42 mL/min — ABNORMAL LOW
Glucose, Bld: 95 mg/dL (ref 70–99)
Potassium: 4.1 mmol/L (ref 3.5–5.1)
Sodium: 141 mmol/L (ref 135–145)

## 2024-11-26 LAB — CBC
HCT: 27.6 % — ABNORMAL LOW (ref 39.0–52.0)
Hemoglobin: 8.4 g/dL — ABNORMAL LOW (ref 13.0–17.0)
MCH: 21.8 pg — ABNORMAL LOW (ref 26.0–34.0)
MCHC: 30.4 g/dL (ref 30.0–36.0)
MCV: 71.5 fL — ABNORMAL LOW (ref 80.0–100.0)
Platelets: 390 K/uL (ref 150–400)
RBC: 3.86 MIL/uL — ABNORMAL LOW (ref 4.22–5.81)
RDW: 15.9 % — ABNORMAL HIGH (ref 11.5–15.5)
WBC: 11.1 K/uL — ABNORMAL HIGH (ref 4.0–10.5)
nRBC: 0 % (ref 0.0–0.2)

## 2024-11-26 LAB — AEROBIC/ANAEROBIC CULTURE W GRAM STAIN (SURGICAL/DEEP WOUND)

## 2024-11-26 MED ORDER — AMOXICILLIN-POT CLAVULANATE 500-125 MG PO TABS: 1.0000 | ORAL_TABLET | Freq: Two times a day (BID) | ORAL | Status: AC

## 2024-11-26 MED ORDER — CEPHALEXIN 500 MG PO CAPS: 500.0000 mg | ORAL_CAPSULE | Freq: Two times a day (BID) | ORAL | Status: DC

## 2024-11-26 MED ORDER — AMLODIPINE BESYLATE 10 MG PO TABS: 10.0000 mg | ORAL_TABLET | Freq: Every day | ORAL | Status: AC

## 2024-11-26 MED ORDER — ALPRAZOLAM 0.5 MG PO TABS: 0.5000 mg | ORAL_TABLET | Freq: Two times a day (BID) | ORAL | 0 refills | Status: AC | PRN

## 2024-11-26 NOTE — Progress Notes (Signed)
 Attempted to call report twice to [facility] [phone], nurse never answered the phone. [facility] here to pick up patient . Writer number given to [facility] to have facility call RN with questions. Paperwork placed in Discharge package. Patient wife at bedside and aware of transfer

## 2024-11-26 NOTE — Progress Notes (Signed)
 "   Referring Physician(s): Dr. Sherlon  Supervising Physician: Hughes Simmonds  Patient Status:  [facility] - In-pt  Chief Complaint: Left renal abscess  Subjective: Patient lying in bed.  Drain in place in the left flank with little output, however bulb not charged correctly.   Aware he is for discharge later today.   Allergies: Patient has no known allergies.  Medications: Prior to Admission medications  Medication Sig Start Date End Date Taking? Authorizing Provider  albuterol  (VENTOLIN  HFA) 108 (90 Base) MCG/ACT inhaler Inhale 2 puffs into the lungs every 6 (six) hours as needed for wheezing or shortness of breath. 07/12/21  Yes Young, Reggy D, MD  atorvastatin  (LIPITOR) 20 MG tablet Take 1 tablet (20 mg total) by mouth daily. 02/04/24 11/16/24 Yes Christobal Guadalajara, MD  Chlorphen-PE-Acetaminophen  4-10-325 MG TABS Take 1 tablet by mouth daily as needed (For sinus). 02/20/24  Yes [provider]  Cholecalciferol  50 MCG (2000 UT) TABS Take 2,000 Units by mouth daily.   Yes [provider]  Cyanocobalamin  (VITAMIN B12 PO) Take 1 tablet by mouth daily.   Yes [provider]  escitalopram  (LEXAPRO ) 10 MG tablet Take 10 mg by mouth daily.   Yes [provider]  FeFum-FePoly-FA-B Cmp-C-Biot (FOLIVANE-PLUS) CAPS Take 1 capsule by mouth daily.   Yes [provider]  furosemide  (LASIX ) 40 MG tablet Take 40 mg daily only if needed for swelling or weight gain. 04/24/24  Yes Patwardhan, Manish J, MD  ipratropium-albuterol  (DUONEB) 0.5-2.5 (3) MG/3ML SOLN Inhale 3 mLs into the lungs every 2 (two) hours as needed (SOB/Wheezing).   Yes [provider]  Magnesium  Oxide, Laxative, 500 MG TABS Take 1 tablet by mouth daily.   Yes [provider]  memantine  (NAMENDA ) 10 MG tablet Take 10 mg by mouth 2 (two) times daily. 02/17/24  Yes [provider]  OLANZapine  (ZYPREXA ) 2.5 MG tablet Take 1 tablet (2.5 mg total) by mouth 2 (two) times daily as  needed (agitation). 06/05/23  Yes Randol Simmonds, MD  pantoprazole  (PROTONIX ) 40 MG tablet Take 1 tablet (40 mg total) by mouth 2 (two) times daily. Patient taking differently: Take 40 mg by mouth 2 (two) times daily as needed (acid reflex). 02/18/24  Yes Mdala-Gausi, Masiku Agatha, MD  Probiotic Product (ULTRAFLORA IMMUNE HEALTH PO) Take 1 tablet by mouth daily.   Yes [provider]  sodium chloride  flush (NS) 0.9 % SOLN 10 mLs by Intracatheter route daily. Please dispense 1 box of 10 mL pre-filled syringes (#60). 11/24/24  Yes Davenport, Kristi B, NP  ALPRAZolam  (XANAX ) 0.5 MG tablet Take 1 tablet (0.5 mg total) by mouth 2 (two) times daily as needed for anxiety. 11/26/24   Ghimire, Donalda HERO, MD  amLODipine  (NORVASC ) 10 MG tablet Take 1 tablet (10 mg total) by mouth daily. 11/26/24   Ghimire, Donalda HERO, MD  cephALEXin  (KEFLEX ) 500 MG capsule Take 1 capsule (500 mg total) by mouth every 12 (twelve) hours for 5 days. 11/26/24 12/01/24  Ghimire, Donalda HERO, MD  DULERA  200-5 MCG/ACT AERO Inhale 2 puffs into the lungs 2 (two) times daily. Patient not taking: Reported on 11/16/2024    [provider]     Vital Signs: BP (!) 149/82 (BP Location: Right Arm)   Pulse 90   Temp 98.6 F (37 C) (Oral)   Resp (!) 21   Ht 5' 11 (1.803 m)   Wt 185 lb (83.9 kg)   SpO2 98%   BMI 25.80 kg/m   Physical Exam  NAD, alert Abdomen: soft, non-tender.  L flank drain in place.  Insertion site intact.  Drain kinked due to position in bed. Flushes easily, aspiration yields thick, purulent-appearing fluid.  Stat lock remains in place.   Imaging: CT ABDOMEN PELVIS WO CONTRAST Result Date: 11/25/2024 EXAM: CT ABDOMEN AND PELVIS WITHOUT CONTRAST 11/25/2024 02:23:00 PM TECHNIQUE: CT of the abdomen and pelvis was performed without the administration of intravenous contrast. Multiplanar reformatted images are provided for review. Automated exposure control, iterative reconstruction, and/or weight-based adjustment  of the mA/kV was utilized to reduce the radiation dose to as low as reasonably achievable. COMPARISON: Comparison exam 11/20/2024. CLINICAL HISTORY: Follow up left renal abscess drain for possible removal due to low output. FINDINGS: LOWER CHEST: Left basilar atelectasis. LIVER: The liver is unremarkable. GALLBLADDER AND BILE DUCTS: Gallbladder is unremarkable. No biliary ductal dilatation. SPLEEN: No acute abnormality. PANCREAS: No acute abnormality. ADRENAL GLANDS: No acute abnormality. KIDNEYS, URETERS AND BLADDER: Interval placement of a pigtail transcatheter into the left renal subcapsular collection. There is marked reduction in volume of the subcapsular left renal collection. The collection previously measured 8.4 x 7.2 cm and now measures approximately 4.0 x 3.0 cm. Large simple fluid attenuation cyst extends from the left renal cortex measuring 4.8 cm. Similar benign cyst in the upper pole of the right kidney. Per consensus, no follow-up is needed for simple Bosniak type 1 and 2 renal cysts, unless the patient has a malignancy history or risk factors. No hydronephrosis or obstructive uropathy. No ureteral calculus. No hydroureter. No cystectomy. Ileal conduit anatomy without complication. No perinephric or periureteral stranding. GI AND BOWEL: Stomach demonstrates no acute abnormality. There is no bowel obstruction. PERITONEUM AND RETROPERITONEUM: No ascites. No free air. VASCULATURE: Aorta is normal in caliber. LYMPH NODES: No lymphadenopathy. REPRODUCTIVE ORGANS: No acute abnormality. BONES AND SOFT TISSUES: Posterior lumbar fusion and decompression. No acute osseous abnormality. No focal soft tissue abnormality. IMPRESSION: 1. Marked reduction in volume of the subcapsular left renal collection following percutaneous drainage catheter placement 2. No hydronephrosis or obstructive uropathy. Electronically signed by: Norleen Boxer MD 11/25/2024 04:09 PM EST RP Workstation: [id number]   VAS US  LOWER  EXTREMITY VENOUS (DVT) Result Date: 11/24/2024  Lower Venous DVT Study Patient Name:  Jerry [name].  Date of Exam:   11/23/2024 Medical Rec #: [id number]          Accession #:    [id number] Date of Birth: February 23, 1941           Patient Gender: M Patient Age:   59 years Exam Location:  [facility] Procedure:      VAS US  LOWER EXTREMITY VENOUS (DVT) Referring Phys: DAWOOD ELGERGAWY --------------------------------------------------------------------------------  Indications: Persistent fever.  Risk Factors: Cancer; Bladder 2019. DVT 02/22/24 left popliteal. IVC filter placed 4/112025 by [facility]VS. PE 02/15/2024 Comparison Study: Prior negative bilateral LEV done 03/05/24 (resolution of left                   popliteal DVT noted on study 02/22/24) Performing Technologist: Alberta Lis RVS  Examination Guidelines: A complete evaluation includes B-mode imaging, spectral Doppler, color Doppler, and power Doppler as needed of all accessible portions of each vessel. Bilateral testing is considered an integral part of a complete examination. Limited examinations for reoccurring indications may be performed as noted. The reflux portion of the exam is performed with the patient in reverse Trendelenburg.  +---------+---------------+---------+-----------+----------+---------------+ RIGHT    CompressibilityPhasicitySpontaneityPropertiesThrombus Aging  +---------+---------------+---------+-----------+----------+---------------+ CFV      Full  Yes      Yes                                  +---------+---------------+---------+-----------+----------+---------------+ SFJ      Full                                                         +---------+---------------+---------+-----------+----------+---------------+ FV Prox  Full           Yes      Yes                                  +---------+---------------+---------+-----------+----------+---------------+ FV Mid   Full                                                          +---------+---------------+---------+-----------+----------+---------------+ FV DistalFull                                                         +---------+---------------+---------+-----------+----------+---------------+ PFV      Full           Yes      Yes                                  +---------+---------------+---------+-----------+----------+---------------+ POP      Full           Yes      Yes                                  +---------+---------------+---------+-----------+----------+---------------+ PTV      Full                                                         +---------+---------------+---------+-----------+----------+---------------+ PERO     Full                                                         +---------+---------------+---------+-----------+----------+---------------+ Gastroc  Full                                                         +---------+---------------+---------+-----------+----------+---------------+ SSV      Full  Thickened walls +---------+---------------+---------+-----------+----------+---------------+   +---------+---------------+---------+-----------+----------+--------------+ LEFT     CompressibilityPhasicitySpontaneityPropertiesThrombus Aging +---------+---------------+---------+-----------+----------+--------------+ CFV      Full           Yes      Yes                                 +---------+---------------+---------+-----------+----------+--------------+ SFJ      Full                                                        +---------+---------------+---------+-----------+----------+--------------+ FV Prox  Full           Yes      Yes                                 +---------+---------------+---------+-----------+----------+--------------+ FV Mid   Full           Yes      Yes                                  +---------+---------------+---------+-----------+----------+--------------+ FV DistalFull           Yes      Yes                                 +---------+---------------+---------+-----------+----------+--------------+ PFV      Full           Yes      Yes                                 +---------+---------------+---------+-----------+----------+--------------+ POP      Full           Yes      Yes                                 +---------+---------------+---------+-----------+----------+--------------+ PTV      Full                                                        +---------+---------------+---------+-----------+----------+--------------+ PERO     Full                                                        +---------+---------------+---------+-----------+----------+--------------+ SSV      Full                                                        +---------+---------------+---------+-----------+----------+--------------+  Summary: RIGHT: - There is no evidence of deep vein thrombosis in the lower extremity.  - No cystic structure found in the popliteal fossa.  LEFT: - There is no evidence of deep vein thrombosis in the lower extremity.  - No cystic structure found in the popliteal fossa.  *See table(s) above for measurements and observations. Electronically signed by Gaile New MD on 11/24/2024 at 8:34:40 AM.    Final     Labs:  CBC: Recent Labs    11/23/24 0429 11/24/24 0320 11/25/24 0332 11/26/24 0344  WBC 14.7* 12.0* 10.8* 11.1*  HGB 8.9* 8.6* 8.5* 8.4*  HCT 28.8* 27.7* 27.6* 27.6*  PLT 355 373 358 390    COAGS: Recent Labs    02/21/24 1755 11/15/24 1208 11/16/24 0305 11/21/24 0920  INR 1.6* 1.3* 1.4* 1.3*    BMP: Recent Labs    11/21/24 0406 11/22/24 0516 11/23/24 0429 11/26/24 0344  NA 141 140 142 141  K 4.6 4.3 4.4 4.1  CL 109 109 111 109  CO2 21* 21* 21* 23  GLUCOSE 108* 103* 92 95  BUN 60* 54* 50* 40*  CALCIUM   9.0 8.7* 8.7* 9.1  CREATININE 2.57* 2.28* 2.33* 1.64*  GFRNONAA 24* 28* 27* 42*    LIVER FUNCTION TESTS: Recent Labs    03/03/24 0444 04/14/24 1351 06/12/24 1230 11/15/24 1208  BILITOT 0.4 0.5 0.3 0.6  AST 17 24 11* 17  ALT 15 19 11 10   ALKPHOS 55 76 90 85  PROT 5.3* 7.4 6.8 6.9  ALBUMIN  2.6* 4.2 4.0 4.1    Assessment and Plan: L renal abscess s/p drain placement by Dr. Jennefer 11/21/24 Patient with L flank drain in place today.  Consideration for removal based on low output, however exam today reveals drain kinked due to position in bed and bulb not charged correctly which may be influencing output.  Review of CT performed yesterday with Dr. Hughes who notes the collection has significantly reduced, but has not completely resolved.  Patient transitioned to gravity bag during visit today.  Outpatient orders placed for care after discharge.  Expect return in approximately one week for repeat imaging.    Electronically Signed: Ivis Nicolson Sue-Ellen Shivan Hodes, PA 11/26/2024, 10:13 AM   I spent a total of 15 Minutes at the the patient's bedside AND on the patient's hospital floor or unit, greater than 50% of which was counseling/coordinating care for renal abscess.       "

## 2024-11-26 NOTE — Plan of Care (Signed)

## 2024-11-26 NOTE — TOC Transition Note (Signed)
 Transition of Care [facility]) - Discharge Note   Patient Details  Name: Jerry [name]. MRN: [id number] Date of Birth: Feb 19, 1941  [facility] CM/SW Contact:  Inocente GORMAN Kindle, LCSW Phone Number: 11/26/2024, 11:55 AM   Clinical Narrative:    Patient will DC to: [facility] Anticipated DC date: 11/26/24 Family notified: Spouse Transport by: [facility]   Per MD patient ready for DC to [place]. RN to call report prior to discharge [phone] room [phone]). RN, patient, patient's family, and facility notified of DC. Discharge Summary and FL2 sent to facility. DC packet on chart including signed script. Ambulance transport requested for patient.   CSW will sign off for now as social work intervention is no longer needed. Please consult us  again if new needs arise.     Final next level of care: Skilled Nursing Facility Barriers to Discharge: Barriers Resolved   Patient Goals and CMS Choice Patient states their goals for this hospitalization and ongoing recovery are:: Rehab CMS Medicare.gov Compare Post Acute Care list provided to:: Patient Choice offered to / list presented to : Patient, Spouse [place] ownership interest in [facility].provided to:: Patient    Discharge Placement   Existing PASRR number confirmed : 11/26/24          Patient [facility] Patient to be transferred to facility by: [facility] Name of family member notified: Spouse Patient and family notified of of transfer: 11/26/24  Discharge Plan and Services Additional resources added to the After Visit Summary for   In-house Referral: Clinical Social Work   Post Acute Care Choice: Skilled Nursing Facility                               Social Drivers of Health (SDOH) Interventions SDOH Screenings   Food Insecurity: No Food Insecurity (11/16/2024)  Housing: Low Risk (11/16/2024)  Transportation Needs: No Transportation Needs (11/16/2024)  Utilities: Not At Risk (11/16/2024)   Depression (PHQ2-9): Low Risk (04/14/2024)  Social Connections: Moderately Isolated (11/16/2024)  Tobacco Use: Low Risk (11/15/2024)     Readmission Risk Interventions    11/18/2024    3:02 PM 02/18/2024    2:20 PM 06/27/2023   12:12 PM  Readmission Risk Prevention Plan  Transportation Screening Complete Complete Complete  PCP or Specialist Appt within 5-7 Days   Complete  PCP or Specialist Appt within 3-5 Days  Complete   Home Care Screening   Complete  Medication Review (RN CM)   Complete  HRI or Home Care Consult  Complete   Palliative Care Screening  Not Applicable   Medication Review (RN Care Manager) Complete Complete   PCP or Specialist appointment within 3-5 days of discharge Complete    HRI or Home Care Consult Complete    SW Recovery Care/Counseling Consult Complete    Palliative Care Screening Not Applicable    Skilled Nursing Facility Complete

## 2024-12-01 ENCOUNTER — Other Ambulatory Visit (HOSPITAL_COMMUNITY): Payer: Self-pay | Admitting: Student

## 2024-12-01 DIAGNOSIS — N151 Renal and perinephric abscess: Secondary | ICD-10-CM

## 2024-12-03 ENCOUNTER — Other Ambulatory Visit: Payer: Self-pay

## 2024-12-03 ENCOUNTER — Ambulatory Visit (HOSPITAL_COMMUNITY)
Admission: RE | Admit: 2024-12-03 | Discharge: 2024-12-03 | Disposition: A | Discharge status: Home / Self Care | Source: Ambulatory Visit | Attending: Student | Admitting: Student

## 2024-12-03 ENCOUNTER — Ambulatory Visit (HOSPITAL_COMMUNITY): Admission: RE | Admit: 2024-12-03 | Discharge: 2024-12-03

## 2024-12-03 DIAGNOSIS — N281 Cyst of kidney, acquired: Secondary | ICD-10-CM | POA: Diagnosis not present

## 2024-12-03 DIAGNOSIS — I700 Atherosclerosis of aorta: Secondary | ICD-10-CM | POA: Diagnosis not present

## 2024-12-03 DIAGNOSIS — K449 Diaphragmatic hernia without obstruction or gangrene: Secondary | ICD-10-CM | POA: Diagnosis not present

## 2024-12-03 DIAGNOSIS — Z4803 Encounter for change or removal of drains: Secondary | ICD-10-CM | POA: Insufficient documentation

## 2024-12-03 DIAGNOSIS — N151 Renal and perinephric abscess: Secondary | ICD-10-CM

## 2024-12-03 DIAGNOSIS — K439 Ventral hernia without obstruction or gangrene: Secondary | ICD-10-CM | POA: Insufficient documentation

## 2024-12-03 DIAGNOSIS — K429 Umbilical hernia without obstruction or gangrene: Secondary | ICD-10-CM | POA: Insufficient documentation

## 2024-12-03 HISTORY — PX: IR CV LINE INJECTION: IMG2294

## 2024-12-03 HISTORY — PX: IR SINUS/FIST TUBE CHK-NON GI: IMG673

## 2024-12-03 MED ORDER — IOHEXOL 300 MG/ML  SOLN
50.0000 mL | Freq: Once | INTRAMUSCULAR | Status: AC | PRN
  Administered 2024-12-03: 15 mL

## 2024-12-03 NOTE — Procedures (Signed)
 Interventional Radiology Procedure:   Indications: Evaluate left renal abscess drain.  Minimal output.  Procedure: Drain injection and drain removal  Findings: Small irregular collection around drain.  No connection with collecting system.  Drain was completely removed.   Complications: None     EBL: Minimal  Plan: Drain removed.  Follow up with IR as needed.    Damein Gaunce R. Philip, MD  Pager: [phone]

## 2024-12-04 ENCOUNTER — Other Ambulatory Visit: Payer: Self-pay

## 2024-12-04 ENCOUNTER — Encounter (HOSPITAL_COMMUNITY): Payer: Self-pay | Admitting: Radiology

## 2024-12-04 DIAGNOSIS — N151 Renal and perinephric abscess: Secondary | ICD-10-CM

## 2024-12-19 ENCOUNTER — Inpatient Hospital Stay: Attending: Hematology and Oncology

## 2024-12-19 DIAGNOSIS — D539 Nutritional anemia, unspecified: Secondary | ICD-10-CM

## 2024-12-19 DIAGNOSIS — N1832 Chronic kidney disease, stage 3b: Secondary | ICD-10-CM

## 2024-12-19 LAB — CMP (CANCER CENTER ONLY)
ALT: 15 U/L (ref 0–44)
AST: 18 U/L (ref 15–41)
Albumin: 3.9 g/dL (ref 3.5–5.0)
Alkaline Phosphatase: 104 U/L (ref 38–126)
Anion gap: 13 (ref 5–15)
BUN: 41 mg/dL — ABNORMAL HIGH (ref 8–23)
CO2: 21 mmol/L — ABNORMAL LOW (ref 22–32)
Calcium: 9.9 mg/dL (ref 8.9–10.3)
Chloride: 106 mmol/L (ref 98–111)
Creatinine: 2.21 mg/dL — ABNORMAL HIGH (ref 0.61–1.24)
GFR, Estimated: 29 mL/min — ABNORMAL LOW
Glucose, Bld: 140 mg/dL — ABNORMAL HIGH (ref 70–99)
Potassium: 5.1 mmol/L (ref 3.5–5.1)
Sodium: 140 mmol/L (ref 135–145)
Total Bilirubin: 0.3 mg/dL (ref 0.0–1.2)
Total Protein: 7.3 g/dL (ref 6.5–8.1)

## 2024-12-19 LAB — CBC WITH DIFFERENTIAL (CANCER CENTER ONLY)
Abs Immature Granulocytes: 0.05 10*3/uL (ref 0.00–0.07)
Basophils Absolute: 0 10*3/uL (ref 0.0–0.1)
Basophils Relative: 0 %
Eosinophils Absolute: 0.2 10*3/uL (ref 0.0–0.5)
Eosinophils Relative: 3 %
HCT: 28.5 % — ABNORMAL LOW (ref 39.0–52.0)
Hemoglobin: 8.6 g/dL — ABNORMAL LOW (ref 13.0–17.0)
Immature Granulocytes: 1 %
Lymphocytes Relative: 21 %
Lymphs Abs: 1.7 10*3/uL (ref 0.7–4.0)
MCH: 21.7 pg — ABNORMAL LOW (ref 26.0–34.0)
MCHC: 30.2 g/dL (ref 30.0–36.0)
MCV: 71.8 fL — ABNORMAL LOW (ref 80.0–100.0)
Monocytes Absolute: 0.8 10*3/uL (ref 0.1–1.0)
Monocytes Relative: 10 %
Neutro Abs: 5.1 10*3/uL (ref 1.7–7.7)
Neutrophils Relative %: 65 %
Platelet Count: 271 10*3/uL (ref 150–400)
RBC: 3.97 MIL/uL — ABNORMAL LOW (ref 4.22–5.81)
RDW: 17.6 % — ABNORMAL HIGH (ref 11.5–15.5)
WBC Count: 7.8 10*3/uL (ref 4.0–10.5)
nRBC: 0 % (ref 0.0–0.2)

## 2024-12-19 LAB — IRON AND IRON BINDING CAPACITY (CC-WL,HP ONLY)
Iron: 35 ug/dL — ABNORMAL LOW (ref 45–182)
Saturation Ratios: 12 % — ABNORMAL LOW (ref 17.9–39.5)
TIBC: 284 ug/dL (ref 250–450)
UIBC: 249 ug/dL

## 2024-12-19 LAB — FERRITIN: Ferritin: 48 ng/mL (ref 24–336)

## 2024-12-26 ENCOUNTER — Inpatient Hospital Stay: Admitting: Hematology and Oncology

## 2025-02-26 ENCOUNTER — Ambulatory Visit: Admitting: Cardiology
